# Patient Record
Sex: Female | Born: 1980
Health system: Southern US, Community
[De-identification: ages and names within clinical notes are randomized; demographics above are authoritative.]

## PROBLEM LIST (undated history)

## (undated) ENCOUNTER — Emergency Department (HOSPITAL_COMMUNITY): Admission: EM | Source: Home / Self Care

## (undated) DIAGNOSIS — D509 Iron deficiency anemia, unspecified: Secondary | ICD-10-CM

## (undated) DIAGNOSIS — D649 Anemia, unspecified: Secondary | ICD-10-CM

## (undated) DIAGNOSIS — K5090 Crohn's disease, unspecified, without complications: Secondary | ICD-10-CM

## (undated) DIAGNOSIS — I10 Essential (primary) hypertension: Secondary | ICD-10-CM

## (undated) DIAGNOSIS — E669 Obesity, unspecified: Secondary | ICD-10-CM

## (undated) DIAGNOSIS — R1012 Left upper quadrant pain: Secondary | ICD-10-CM

## (undated) HISTORY — PX: TOENAIL EXCISION: SHX183

## (undated) HISTORY — DX: Anemia, unspecified: D64.9

## (undated) HISTORY — DX: Left upper quadrant pain: R10.12

---
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
## 2001-08-16 ENCOUNTER — Emergency Department (HOSPITAL_COMMUNITY): Admission: EM | Admit: 2001-08-16 | Discharge: 2001-08-16 | Payer: Self-pay | Admitting: Emergency Medicine

## 2002-11-07 ENCOUNTER — Emergency Department (HOSPITAL_COMMUNITY): Admission: EM | Admit: 2002-11-07 | Discharge: 2002-11-07 | Payer: Self-pay | Admitting: Emergency Medicine

## 2002-11-07 ENCOUNTER — Encounter: Payer: Self-pay | Admitting: Emergency Medicine

## 2002-12-29 ENCOUNTER — Emergency Department (HOSPITAL_COMMUNITY): Admission: EM | Admit: 2002-12-29 | Discharge: 2002-12-29 | Payer: Self-pay | Admitting: Emergency Medicine

## 2003-03-17 ENCOUNTER — Emergency Department (HOSPITAL_COMMUNITY): Admission: EM | Admit: 2003-03-17 | Discharge: 2003-03-17 | Payer: Self-pay | Admitting: Emergency Medicine

## 2003-05-05 ENCOUNTER — Inpatient Hospital Stay (HOSPITAL_COMMUNITY): Admission: AD | Admit: 2003-05-05 | Discharge: 2003-05-05 | Payer: Self-pay | Admitting: Obstetrics & Gynecology

## 2003-05-11 ENCOUNTER — Emergency Department (HOSPITAL_COMMUNITY): Admission: AD | Admit: 2003-05-11 | Discharge: 2003-05-11 | Payer: Self-pay | Admitting: Family Medicine

## 2003-08-15 ENCOUNTER — Emergency Department (HOSPITAL_COMMUNITY): Admission: EM | Admit: 2003-08-15 | Discharge: 2003-08-15 | Payer: Self-pay | Admitting: Emergency Medicine

## 2003-12-05 ENCOUNTER — Emergency Department (HOSPITAL_COMMUNITY): Admission: EM | Admit: 2003-12-05 | Discharge: 2003-12-05 | Payer: Self-pay | Admitting: Family Medicine

## 2004-08-28 ENCOUNTER — Emergency Department (HOSPITAL_COMMUNITY): Admission: EM | Admit: 2004-08-28 | Discharge: 2004-08-28 | Payer: Self-pay | Admitting: *Deleted

## 2005-01-06 ENCOUNTER — Emergency Department (HOSPITAL_COMMUNITY): Admission: EM | Admit: 2005-01-06 | Discharge: 2005-01-06 | Payer: Self-pay | Admitting: Emergency Medicine

## 2005-01-26 ENCOUNTER — Emergency Department (HOSPITAL_COMMUNITY): Admission: EM | Admit: 2005-01-26 | Discharge: 2005-01-26 | Payer: Self-pay | Admitting: Emergency Medicine

## 2005-06-04 ENCOUNTER — Emergency Department (HOSPITAL_COMMUNITY): Admission: EM | Admit: 2005-06-04 | Discharge: 2005-06-04 | Payer: Self-pay | Admitting: Emergency Medicine

## 2005-08-16 ENCOUNTER — Emergency Department (HOSPITAL_COMMUNITY): Admission: EM | Admit: 2005-08-16 | Discharge: 2005-08-16 | Payer: Self-pay | Admitting: Emergency Medicine

## 2005-09-29 ENCOUNTER — Emergency Department (HOSPITAL_COMMUNITY): Admission: EM | Admit: 2005-09-29 | Discharge: 2005-09-29 | Payer: Self-pay | Admitting: Family Medicine

## 2005-11-24 ENCOUNTER — Ambulatory Visit (HOSPITAL_COMMUNITY): Admission: RE | Admit: 2005-11-24 | Discharge: 2005-11-24 | Payer: Self-pay | Admitting: Family Medicine

## 2005-11-24 ENCOUNTER — Emergency Department (HOSPITAL_COMMUNITY): Admission: EM | Admit: 2005-11-24 | Discharge: 2005-11-24 | Payer: Self-pay | Admitting: Family Medicine

## 2005-11-28 ENCOUNTER — Emergency Department (HOSPITAL_COMMUNITY): Admission: EM | Admit: 2005-11-28 | Discharge: 2005-11-28 | Payer: Self-pay | Admitting: Emergency Medicine

## 2005-12-19 ENCOUNTER — Emergency Department (HOSPITAL_COMMUNITY): Admission: EM | Admit: 2005-12-19 | Discharge: 2005-12-19 | Payer: Self-pay | Admitting: Family Medicine

## 2006-01-19 ENCOUNTER — Emergency Department (HOSPITAL_COMMUNITY): Admission: EM | Admit: 2006-01-19 | Discharge: 2006-01-19 | Payer: Self-pay | Admitting: Emergency Medicine

## 2006-01-27 ENCOUNTER — Emergency Department (HOSPITAL_COMMUNITY): Admission: EM | Admit: 2006-01-27 | Discharge: 2006-01-27 | Payer: Self-pay | Admitting: Emergency Medicine

## 2006-03-17 ENCOUNTER — Emergency Department (HOSPITAL_COMMUNITY): Admission: EM | Admit: 2006-03-17 | Discharge: 2006-03-17 | Payer: Self-pay | Admitting: Family Medicine

## 2006-03-20 ENCOUNTER — Emergency Department (HOSPITAL_COMMUNITY): Admission: EM | Admit: 2006-03-20 | Discharge: 2006-03-20 | Payer: Self-pay | Admitting: Family Medicine

## 2006-06-29 ENCOUNTER — Emergency Department (HOSPITAL_COMMUNITY): Admission: EM | Admit: 2006-06-29 | Discharge: 2006-06-29 | Payer: Self-pay | Admitting: Emergency Medicine

## 2006-09-13 ENCOUNTER — Emergency Department (HOSPITAL_COMMUNITY): Admission: EM | Admit: 2006-09-13 | Discharge: 2006-09-13 | Payer: Self-pay | Admitting: Family Medicine

## 2006-12-22 ENCOUNTER — Emergency Department (HOSPITAL_COMMUNITY): Admission: EM | Admit: 2006-12-22 | Discharge: 2006-12-22 | Payer: Self-pay | Admitting: Emergency Medicine

## 2008-04-18 ENCOUNTER — Emergency Department (HOSPITAL_COMMUNITY): Admission: EM | Admit: 2008-04-18 | Discharge: 2008-04-18 | Payer: Self-pay | Admitting: Emergency Medicine

## 2008-04-27 ENCOUNTER — Emergency Department (HOSPITAL_COMMUNITY): Admission: EM | Admit: 2008-04-27 | Discharge: 2008-04-27 | Payer: Self-pay | Admitting: Emergency Medicine

## 2008-06-06 ENCOUNTER — Emergency Department (HOSPITAL_COMMUNITY): Admission: EM | Admit: 2008-06-06 | Discharge: 2008-06-06 | Payer: Self-pay | Admitting: Emergency Medicine

## 2008-06-23 ENCOUNTER — Emergency Department (HOSPITAL_COMMUNITY): Admission: EM | Admit: 2008-06-23 | Discharge: 2008-06-23 | Payer: Self-pay | Admitting: Emergency Medicine

## 2008-08-27 ENCOUNTER — Emergency Department (HOSPITAL_COMMUNITY): Admission: EM | Admit: 2008-08-27 | Discharge: 2008-08-28 | Payer: Self-pay | Admitting: Emergency Medicine

## 2008-12-09 ENCOUNTER — Emergency Department (HOSPITAL_COMMUNITY): Admission: EM | Admit: 2008-12-09 | Discharge: 2008-12-09 | Payer: Self-pay | Admitting: Family Medicine

## 2008-12-11 ENCOUNTER — Emergency Department (HOSPITAL_COMMUNITY): Admission: EM | Admit: 2008-12-11 | Discharge: 2008-12-11 | Payer: Self-pay | Admitting: Emergency Medicine

## 2009-03-24 ENCOUNTER — Encounter (INDEPENDENT_AMBULATORY_CARE_PROVIDER_SITE_OTHER): Payer: Self-pay | Admitting: Family Medicine

## 2009-03-24 ENCOUNTER — Ambulatory Visit: Payer: Self-pay | Admitting: Internal Medicine

## 2009-03-24 LAB — CONVERTED CEMR LAB
ALT: 11 units/L (ref 0–35)
AST: 14 units/L (ref 0–37)
Albumin: 3.9 g/dL (ref 3.5–5.2)
Alkaline Phosphatase: 78 units/L (ref 39–117)
BUN: 11 mg/dL (ref 6–23)
Basophils Absolute: 0.1 10*3/uL (ref 0.0–0.1)
Basophils Relative: 1 % (ref 0–1)
CO2: 22 meq/L (ref 19–32)
Calcium: 9 mg/dL (ref 8.4–10.5)
Chloride: 108 meq/L (ref 96–112)
Cholesterol: 129 mg/dL (ref 0–200)
Creatinine, Ser: 0.6 mg/dL (ref 0.40–1.20)
Eosinophils Absolute: 0.2 10*3/uL (ref 0.0–0.7)
Eosinophils Relative: 3 % (ref 0–5)
Glucose, Bld: 87 mg/dL (ref 70–99)
HCT: 32.1 % — ABNORMAL LOW (ref 36.0–46.0)
HDL: 36 mg/dL — ABNORMAL LOW (ref >39)
Helicobacter Pylori Antibody-IgG: <0.4
Hemoglobin: 9.5 g/dL — ABNORMAL LOW (ref 12.0–15.0)
LDL Cholesterol: 80 mg/dL (ref 0–99)
Lymphocytes Relative: 47 % — ABNORMAL HIGH (ref 12–46)
Lymphs Abs: 3.7 10*3/uL (ref 0.7–4.0)
MCHC: 29.6 g/dL — ABNORMAL LOW (ref 30.0–36.0)
MCV: 62.3 fL — ABNORMAL LOW (ref 78.0–100.0)
Monocytes Absolute: 0.4 10*3/uL (ref 0.1–1.0)
Monocytes Relative: 5 % (ref 3–12)
Neutro Abs: 3.5 10*3/uL (ref 1.7–7.7)
Neutrophils Relative %: 45 % (ref 43–77)
Platelets: 399 10*3/uL (ref 150–400)
Potassium: 4 meq/L (ref 3.5–5.3)
RBC: 5.15 M/uL — ABNORMAL HIGH (ref 3.87–5.11)
RDW: 17.9 % — ABNORMAL HIGH (ref 11.5–15.5)
Sodium: 141 meq/L (ref 135–145)
TSH: 1.071 microintl units/mL (ref 0.350–4.500)
Testosterone: 41.57 ng/dL (ref 10–70)
Total Bilirubin: 0.4 mg/dL (ref 0.3–1.2)
Total CHOL/HDL Ratio: 3.6
Total Protein: 7.6 g/dL (ref 6.0–8.3)
Triglycerides: 66 mg/dL (ref <150)
VLDL: 13 mg/dL (ref 0–40)
WBC: 7.8 10*3/uL (ref 4.0–10.5)

## 2009-04-27 ENCOUNTER — Emergency Department (HOSPITAL_COMMUNITY): Admission: EM | Admit: 2009-04-27 | Discharge: 2009-04-27 | Payer: Self-pay | Admitting: Family Medicine

## 2009-06-29 ENCOUNTER — Emergency Department (HOSPITAL_COMMUNITY): Admission: EM | Admit: 2009-06-29 | Discharge: 2009-06-30 | Payer: Self-pay | Admitting: Emergency Medicine

## 2009-08-27 ENCOUNTER — Emergency Department (HOSPITAL_COMMUNITY): Admission: EM | Admit: 2009-08-27 | Discharge: 2009-08-27 | Payer: Self-pay | Admitting: Emergency Medicine

## 2009-08-29 ENCOUNTER — Emergency Department (HOSPITAL_COMMUNITY): Admission: EM | Admit: 2009-08-29 | Discharge: 2009-08-29 | Payer: Self-pay | Admitting: Family Medicine

## 2010-03-03 ENCOUNTER — Emergency Department (HOSPITAL_COMMUNITY)
Admission: EM | Admit: 2010-03-03 | Discharge: 2010-03-03 | Payer: Self-pay | Source: Home / Self Care | Admitting: Family Medicine

## 2010-03-06 LAB — POCT URINALYSIS DIPSTICK
Bilirubin Urine: NEGATIVE
Hgb urine dipstick: NEGATIVE
Ketones, ur: NEGATIVE mg/dL
Nitrite: NEGATIVE
Protein, ur: NEGATIVE mg/dL
Specific Gravity, Urine: 1.015 (ref 1.005–1.030)
Urine Glucose, Fasting: NEGATIVE mg/dL
Urobilinogen, UA: 1 mg/dL (ref 0.0–1.0)
pH: 7 (ref 5.0–8.0)

## 2010-03-06 LAB — POCT PREGNANCY, URINE: Preg Test, Ur: NEGATIVE

## 2010-05-20 LAB — DIFFERENTIAL
Basophils Absolute: 0.1 10*3/uL (ref 0.0–0.1)
Basophils Relative: 1 % (ref 0–1)
Eosinophils Absolute: 0.4 10*3/uL (ref 0.0–0.7)
Eosinophils Relative: 4 % (ref 0–5)
Lymphocytes Relative: 34 % (ref 12–46)
Lymphs Abs: 3.5 10*3/uL (ref 0.7–4.0)
Monocytes Absolute: 0.5 10*3/uL (ref 0.1–1.0)
Monocytes Relative: 5 % (ref 3–12)
Neutro Abs: 5.9 10*3/uL (ref 1.7–7.7)
Neutrophils Relative %: 56 % (ref 43–77)

## 2010-05-20 LAB — COMPREHENSIVE METABOLIC PANEL
ALT: 15 U/L (ref 0–35)
AST: 18 U/L (ref 0–37)
Albumin: 3.4 g/dL — ABNORMAL LOW (ref 3.5–5.2)
Alkaline Phosphatase: 81 U/L (ref 39–117)
BUN: 10 mg/dL (ref 6–23)
CO2: 24 mEq/L (ref 19–32)
Calcium: 9.1 mg/dL (ref 8.4–10.5)
Chloride: 106 mEq/L (ref 96–112)
Creatinine, Ser: 0.57 mg/dL (ref 0.4–1.2)
GFR calc Af Amer: >60 mL/min (ref >60)
GFR calc non Af Amer: >60 mL/min (ref >60)
Glucose, Bld: 91 mg/dL (ref 70–99)
Potassium: 3.8 mEq/L (ref 3.5–5.1)
Sodium: 138 mEq/L (ref 135–145)
Total Bilirubin: 0.5 mg/dL (ref 0.3–1.2)
Total Protein: 7.3 g/dL (ref 6.0–8.3)

## 2010-05-20 LAB — URINALYSIS, ROUTINE W REFLEX MICROSCOPIC
Bilirubin Urine: NEGATIVE
Glucose, UA: NEGATIVE mg/dL
Hgb urine dipstick: NEGATIVE
Ketones, ur: NEGATIVE mg/dL
Nitrite: NEGATIVE
Protein, ur: NEGATIVE mg/dL
Specific Gravity, Urine: 1.046 — ABNORMAL HIGH (ref 1.005–1.030)
Urobilinogen, UA: 1 mg/dL (ref 0.0–1.0)
pH: 6 (ref 5.0–8.0)

## 2010-05-20 LAB — CBC
HCT: 31.9 % — ABNORMAL LOW (ref 36.0–46.0)
Hemoglobin: 9.7 g/dL — ABNORMAL LOW (ref 12.0–15.0)
MCHC: 30.4 g/dL (ref 30.0–36.0)
MCV: 62.4 fL — ABNORMAL LOW (ref 78.0–100.0)
Platelets: 335 10*3/uL (ref 150–400)
RBC: 5.11 MIL/uL (ref 3.87–5.11)
RDW: 19.6 % — ABNORMAL HIGH (ref 11.5–15.5)
WBC: 10.4 10*3/uL (ref 4.0–10.5)

## 2010-05-20 LAB — PREGNANCY, URINE: Preg Test, Ur: NEGATIVE

## 2010-05-20 LAB — LIPASE, BLOOD: Lipase: 14 U/L (ref 11–59)

## 2010-05-24 LAB — DIFFERENTIAL
Basophils Absolute: 0.1 10*3/uL (ref 0.0–0.1)
Basophils Relative: 1 % (ref 0–1)
Eosinophils Absolute: 0.1 10*3/uL (ref 0.0–0.7)
Eosinophils Relative: 1 % (ref 0–5)
Lymphocytes Relative: 34 % (ref 12–46)
Lymphs Abs: 4.6 10*3/uL — ABNORMAL HIGH (ref 0.7–4.0)
Monocytes Absolute: 0.7 10*3/uL (ref 0.1–1.0)
Monocytes Relative: 5 % (ref 3–12)
Neutro Abs: 8.2 10*3/uL — ABNORMAL HIGH (ref 1.7–7.7)
Neutrophils Relative %: 60 % (ref 43–77)

## 2010-05-24 LAB — CBC
HCT: 30 % — ABNORMAL LOW (ref 36.0–46.0)
Hemoglobin: 9.2 g/dL — ABNORMAL LOW (ref 12.0–15.0)
MCHC: 30.8 g/dL (ref 30.0–36.0)
MCV: 62.4 fL — ABNORMAL LOW (ref 78.0–100.0)
Platelets: 342 10*3/uL (ref 150–400)
RBC: 4.8 MIL/uL (ref 3.87–5.11)
RDW: 19.8 % — ABNORMAL HIGH (ref 11.5–15.5)
WBC: 13.7 10*3/uL — ABNORMAL HIGH (ref 4.0–10.5)

## 2010-12-29 ENCOUNTER — Encounter: Payer: Self-pay | Admitting: Emergency Medicine

## 2010-12-29 ENCOUNTER — Emergency Department (HOSPITAL_COMMUNITY)
Admission: EM | Admit: 2010-12-29 | Discharge: 2010-12-29 | Disposition: A | Discharge status: Home / Self Care | Payer: Self-pay | Attending: Emergency Medicine | Admitting: Emergency Medicine

## 2010-12-29 DIAGNOSIS — K029 Dental caries, unspecified: Secondary | ICD-10-CM | POA: Insufficient documentation

## 2010-12-29 DIAGNOSIS — H9209 Otalgia, unspecified ear: Secondary | ICD-10-CM | POA: Insufficient documentation

## 2010-12-29 DIAGNOSIS — S025XXA Fracture of tooth (traumatic), initial encounter for closed fracture: Secondary | ICD-10-CM | POA: Insufficient documentation

## 2010-12-29 DIAGNOSIS — X58XXXA Exposure to other specified factors, initial encounter: Secondary | ICD-10-CM | POA: Insufficient documentation

## 2010-12-29 DIAGNOSIS — Z87891 Personal history of nicotine dependence: Secondary | ICD-10-CM | POA: Insufficient documentation

## 2010-12-29 DIAGNOSIS — K089 Disorder of teeth and supporting structures, unspecified: Secondary | ICD-10-CM | POA: Insufficient documentation

## 2010-12-29 DIAGNOSIS — R070 Pain in throat: Secondary | ICD-10-CM | POA: Insufficient documentation

## 2010-12-29 LAB — RAPID STREP SCREEN (MED CTR MEBANE ONLY): Streptococcus, Group A Screen (Direct): NEGATIVE

## 2010-12-29 MED ORDER — PENICILLIN V POTASSIUM 500 MG PO TABS: 500.0000 mg | ORAL_TABLET | Freq: Four times a day (QID) | ORAL | Status: AC

## 2010-12-29 MED ORDER — HYDROCODONE-ACETAMINOPHEN 5-500 MG PO TABS: 1.0000 | ORAL_TABLET | Freq: Four times a day (QID) | ORAL | Status: AC | PRN

## 2010-12-29 MED ORDER — TRAMADOL HCL 50 MG PO TABS
50.0000 mg | ORAL_TABLET | Freq: Once | ORAL | Status: AC
  Administered 2010-12-29: 50 mg via ORAL
  Filled 2010-12-29: qty 1

## 2010-12-29 NOTE — ED Provider Notes (Signed)
History     CSN: [id number] Arrival date & time: 12/29/2010  4:38 AM   First MD Initiated Contact with Patient 12/29/10 0500      Chief Complaint  Patient presents with  . Otalgia    (Consider location/radiation/quality/duration/timing/severity/associated sxs/prior treatment) Patient is a 30 y.o. female presenting with ear pain and dental injury. The history is provided by the patient. No language interpreter was used.  Otalgia This is a new problem. The current episode started more than 2 days ago. There is pain in the left ear. The problem occurs constantly. The problem has not changed since onset.There has been no fever. The pain is at a severity of 9/10. The pain is severe. Associated symptoms include sore throat. Pertinent negatives include no ear discharge, no headaches, no hearing loss, no rhinorrhea, no abdominal pain, no diarrhea, no vomiting and no neck pain. Associated symptoms comments: Toothache.  No trismus no change in voice no drooling no difficulty swallowing only pain with repeated swallowing. Her past medical history does not include chronic ear infection, hearing loss or tympanostomy tube.  Dental Injury This is a new problem. The current episode started more than 2 days ago. The problem occurs constantly. The problem has not changed since onset.Pertinent negatives include no chest pain, no abdominal pain, no headaches and no shortness of breath. The symptoms are aggravated by nothing. The symptoms are relieved by nothing. She has tried acetaminophen for the symptoms. The treatment provided no relief.  left lower 2nd molar  Past Medical History  Diagnosis Date  . Asthma     History reviewed. No pertinent past surgical history.  History reviewed. No pertinent family history.  History  Substance Use Topics  . Smoking status: Former Games developer  . Smokeless tobacco: Not on file  . Alcohol Use: No    OB History    Grav Para Term Preterm Abortions TAB SAB Ect Mult  Living                  Review of Systems  Constitutional: Negative for activity change.  HENT: Positive for ear pain and sore throat. Negative for hearing loss, nosebleeds, congestion, facial swelling, rhinorrhea, neck pain, neck stiffness and ear discharge.   Eyes: Negative for discharge.  Respiratory: Negative for apnea and shortness of breath.   Cardiovascular: Negative for chest pain.  Gastrointestinal: Negative for vomiting, abdominal pain, diarrhea and abdominal distention.  Genitourinary: Negative for difficulty urinating.  Musculoskeletal: Negative for arthralgias.  Neurological: Negative for facial asymmetry and headaches.  Hematological: Negative.   Psychiatric/Behavioral: Negative.     Allergies  Shellfish allergy and Sulfa antibiotics  Home Medications   Current Outpatient Rx  Name Route Sig Dispense Refill  . ALBUTEROL SULFATE HFA 108 (90 BASE) MCG/ACT IN AERS Inhalation Inhale 2 puffs into the lungs every 6 (six) hours as needed. wheezing       BP 144/92  Pulse 74  Temp(Src) 97.8 F (36.6 C) (Oral)  Resp 20  SpO2 95%  Physical Exam  Constitutional: She is oriented to person, place, and time. She appears well-developed and well-nourished.  HENT:  Head: Normocephalic and atraumatic.  Right Ear: No drainage or tenderness. Tympanic membrane is not injected, not scarred and not erythematous. No middle ear effusion.  Left Ear: No drainage or tenderness. Tympanic membrane is not injected, not scarred and not erythematous.  No middle ear effusion.  Mouth/Throat: Oropharynx is clear and moist.    Eyes: Conjunctivae and EOM are normal. Pupils are equal,  round, and reactive to light. Right eye exhibits no discharge. Left eye exhibits no discharge.  Neck: Normal range of motion. Neck supple. No JVD present.  Cardiovascular: Normal rate and regular rhythm.   Pulmonary/Chest: Effort normal and breath sounds normal. No stridor.  Abdominal: Soft. Bowel sounds are  normal. There is no tenderness. There is no rebound and no guarding.  Musculoskeletal: She exhibits no edema.  Lymphadenopathy:    She has no cervical adenopathy.  Neurological: She is alert and oriented to person, place, and time.  Skin: Skin is warm and dry.  Psychiatric: She has a normal mood and affect.    ED Course  Procedures (including critical care time)   Labs Reviewed  RAPID STREP SCREEN   No results found.   No diagnosis found.    MDM  Patient counseled to use bnarrier protection while on and for several weeks following taking penicillin antibiotics to prevent the risk of pregnancy.  Patient verbalizes understanding.          Jasmine Awe, MD 12/29/10 [phone]

## 2010-12-29 NOTE — ED Notes (Signed)
Patient with left ear pain, which radiates to left neck/throat.  Patient having difficulty swallowing.

## 2010-12-29 NOTE — ED Notes (Signed)
Patient with broken tooth in lower left jaw.  Patient has swelling and pain from ear down neck and having difficulty swallowing.  Patient remains CAOx3 at this time.

## 2011-02-18 ENCOUNTER — Emergency Department (HOSPITAL_COMMUNITY)
Admission: EM | Admit: 2011-02-18 | Discharge: 2011-02-18 | Disposition: A | Discharge status: Home / Self Care | Payer: Self-pay | Attending: Emergency Medicine | Admitting: Emergency Medicine

## 2011-02-18 ENCOUNTER — Encounter (HOSPITAL_COMMUNITY): Payer: Self-pay | Admitting: Emergency Medicine

## 2011-02-18 DIAGNOSIS — B349 Viral infection, unspecified: Secondary | ICD-10-CM

## 2011-02-18 DIAGNOSIS — B9789 Other viral agents as the cause of diseases classified elsewhere: Secondary | ICD-10-CM | POA: Insufficient documentation

## 2011-02-18 DIAGNOSIS — J45909 Unspecified asthma, uncomplicated: Secondary | ICD-10-CM | POA: Insufficient documentation

## 2011-02-18 DIAGNOSIS — R51 Headache: Secondary | ICD-10-CM | POA: Insufficient documentation

## 2011-02-18 DIAGNOSIS — IMO0001 Reserved for inherently not codable concepts without codable children: Secondary | ICD-10-CM | POA: Insufficient documentation

## 2011-02-18 DIAGNOSIS — R059 Cough, unspecified: Secondary | ICD-10-CM | POA: Insufficient documentation

## 2011-02-18 DIAGNOSIS — R05 Cough: Secondary | ICD-10-CM | POA: Insufficient documentation

## 2011-02-18 MED ORDER — ALBUTEROL SULFATE HFA 108 (90 BASE) MCG/ACT IN AERS: 2.0000 | INHALATION_SPRAY | RESPIRATORY_TRACT | Status: DC | PRN

## 2011-02-18 MED ORDER — ALBUTEROL SULFATE HFA 108 (90 BASE) MCG/ACT IN AERS
2.0000 | INHALATION_SPRAY | RESPIRATORY_TRACT | Status: DC | PRN
  Administered 2011-02-18: 2 via RESPIRATORY_TRACT
  Filled 2011-02-18: qty 6.7

## 2011-02-18 MED ORDER — ACETAMINOPHEN 325 MG PO TABS
650.0000 mg | ORAL_TABLET | Freq: Once | ORAL | Status: AC
  Administered 2011-02-18: 650 mg via ORAL
  Filled 2011-02-18: qty 2

## 2011-02-18 NOTE — ED Provider Notes (Signed)
Medical screening examination/treatment/procedure(s) were performed by non-physician practitioner and as supervising physician I was immediately available for consultation/collaboration.  Caylon Saine, MD 02/18/11 1614 

## 2011-02-18 NOTE — ED Notes (Signed)
Pt reports cough, wheezing , body aches  And headache. Intermittent fever. Sx unresponsive to OTC meds

## 2011-02-18 NOTE — ED Provider Notes (Signed)
History     CSN: [id number]  Arrival date & time 02/18/11  1015   None     Chief Complaint  Patient presents with  . Cough    Reports cough and wheezing x 3 days    (Consider location/radiation/quality/duration/timing/severity/associated sxs/prior treatment) HPI  Patient with history of asthma who uses as needed albuterol inhalers at home for flares of asthma presents to emergency department complaining of a 4 to 5-day history of gradual increasing cough, wheezing, body aches, intermittent headaches, and a fever of 102 take at home 3 days ago. Patient states she's been taking over-the-counter Tylenol and ibuprofen for aches, pains and fevers but states she has run out of her albuterol inhaler. Patient states that since treating her fever 3 days ago she has not had return of fever. Patient states that other than history of asthma she has no other known medical problems. Patient has not established care with primary care provider. She has aggravating factor stating that inhaler use we'll decrease wheezing and coughing. Symptoms are gradual onset, persistent, and unchanging.  Past Medical History  Diagnosis Date  . Asthma     History reviewed. No pertinent past surgical history.  Family History  Problem Relation Age of Onset  . Diabetes Mother   . Hypertension Mother     History  Substance Use Topics  . Smoking status: Former Games developer  . Smokeless tobacco: Not on file  . Alcohol Use: No    OB History    Grav Para Term Preterm Abortions TAB SAB Ect Mult Living                  Review of Systems  All other systems reviewed and are negative.    Allergies  Shellfish allergy and Sulfa antibiotics  Home Medications   Current Outpatient Rx  Name Route Sig Dispense Refill  . THERAFLU FLU/CHEST CONGESTION PO Oral Take 30 mLs by mouth every 4 (four) hours.      . GUAIFENESIN ER 600 MG PO TB12 Oral Take 1,200 mg by mouth 2 (two) times daily.      . GUAIFENESIN 100 MG/5ML PO  SYRP Oral Take 200 mg by mouth every 4 (four) hours as needed. For cough     . ALBUTEROL SULFATE HFA 108 (90 BASE) MCG/ACT IN AERS Inhalation Inhale 2 puffs into the lungs every 6 (six) hours as needed. wheezing       BP 156/72  Pulse 79  Temp(Src) 98.2 F (36.8 C) (Oral)  Resp 18  SpO2 99%  LMP 02/05/2011  Physical Exam  Vitals reviewed. Constitutional: She is oriented to person, place, and time. She appears well-developed and well-nourished. No distress.  HENT:  Head: Normocephalic and atraumatic.  Right Ear: External ear normal.  Left Ear: External ear normal.  Nose: Nose normal.  Mouth/Throat: No oropharyngeal exudate.       Mild erythema of posterior pharynx and tonsils no tonsillar exudate or enlargement. Patent airway. Swallowing secretions well  Eyes: Conjunctivae and EOM are normal. Pupils are equal, round, and reactive to light.  Neck: Normal range of motion. Neck supple.  Cardiovascular: Normal rate, regular rhythm and normal heart sounds.  Exam reveals no gallop and no friction rub.   No murmur heard. Pulmonary/Chest: Effort normal. No respiratory distress. She has wheezes. She has no rales. She exhibits no tenderness.       Diffuse expiratory wheezing. No accessory muscle use. No respiratory distress.  Abdominal: Soft. She exhibits no distension and no  mass. There is no tenderness. There is no rebound and no guarding.  Lymphadenopathy:    She has no cervical adenopathy.  Neurological: She is alert and oriented to person, place, and time. She has normal reflexes.  Skin: Skin is warm and dry. No rash noted. She is not diaphoretic.  Psychiatric: She has a normal mood and affect.    ED Course  Procedures (including critical care time)  Albuterol inhaler given to bedside. By mouth Tylenol.  Labs Reviewed - No data to display No results found.   1. Asthma   2. Viral syndrome       MDM  Patient has pulse ox of 99% on room air and resolution of wheezing after  2 puffs of inhaler given at bedside. Afebrile and NAD. Low risk PE and PERC negative denying CP.         Jenness [place], [name] 02/18/11 1515

## 2011-05-24 ENCOUNTER — Emergency Department (HOSPITAL_BASED_OUTPATIENT_CLINIC_OR_DEPARTMENT_OTHER)
Admission: EM | Admit: 2011-05-24 | Discharge: 2011-05-24 | Disposition: A | Discharge status: Home / Self Care | Payer: Managed Care, Other (non HMO) | Attending: Emergency Medicine | Admitting: Emergency Medicine

## 2011-05-24 ENCOUNTER — Emergency Department (INDEPENDENT_AMBULATORY_CARE_PROVIDER_SITE_OTHER): Payer: Managed Care, Other (non HMO)

## 2011-05-24 ENCOUNTER — Encounter (HOSPITAL_BASED_OUTPATIENT_CLINIC_OR_DEPARTMENT_OTHER): Payer: Self-pay | Admitting: *Deleted

## 2011-05-24 DIAGNOSIS — F411 Generalized anxiety disorder: Secondary | ICD-10-CM

## 2011-05-24 DIAGNOSIS — R079 Chest pain, unspecified: Secondary | ICD-10-CM

## 2011-05-24 DIAGNOSIS — Z79899 Other long term (current) drug therapy: Secondary | ICD-10-CM | POA: Insufficient documentation

## 2011-05-24 DIAGNOSIS — M940 Chondrocostal junction syndrome [Tietze]: Secondary | ICD-10-CM | POA: Insufficient documentation

## 2011-05-24 DIAGNOSIS — R0602 Shortness of breath: Secondary | ICD-10-CM | POA: Insufficient documentation

## 2011-05-24 DIAGNOSIS — J45909 Unspecified asthma, uncomplicated: Secondary | ICD-10-CM | POA: Insufficient documentation

## 2011-05-24 LAB — CBC
Hemoglobin: 9.9 g/dL — ABNORMAL LOW (ref 12.0–15.0)
MCH: 18.9 pg — ABNORMAL LOW (ref 26.0–34.0)
RBC: 5.25 MIL/uL — ABNORMAL HIGH (ref 3.87–5.11)
WBC: 9.1 10*3/uL (ref 4.0–10.5)

## 2011-05-24 LAB — BASIC METABOLIC PANEL
CO2: 27 mEq/L (ref 19–32)
Chloride: 102 mEq/L (ref 96–112)
Glucose, Bld: 91 mg/dL (ref 70–99)
Potassium: 3.9 mEq/L (ref 3.5–5.1)
Sodium: 137 mEq/L (ref 135–145)

## 2011-05-24 LAB — TROPONIN I: Troponin I: <0.3 ng/mL (ref <0.30)

## 2011-05-24 MED ORDER — OXYCODONE-ACETAMINOPHEN 5-325 MG PO TABS
1.0000 | ORAL_TABLET | Freq: Once | ORAL | Status: AC
  Administered 2011-05-24: 1 via ORAL
  Filled 2011-05-24: qty 1

## 2011-05-24 MED ORDER — IBUPROFEN 400 MG PO TABS
600.0000 mg | ORAL_TABLET | Freq: Once | ORAL | Status: AC
  Administered 2011-05-24: 600 mg via ORAL
  Filled 2011-05-24: qty 1

## 2011-05-24 MED ORDER — IBUPROFEN 600 MG PO TABS: 600.0000 mg | ORAL_TABLET | Freq: Four times a day (QID) | ORAL | Status: AC | PRN

## 2011-05-24 NOTE — Discharge Instructions (Signed)
Costochondritis Costochondritis is a condition in which the tissue (cartilage) that connects your ribs with your breastbone (sternum) becomes irritated and causes chest pain.  HOME CARE  Avoid activities that wear you out.   Do not strain your ribs. Avoid activities that use your:   Chest.   Belly.   Side muscles.   Put ice on the area.   Put ice in a plastic bag.   Place a towel betwen your skin and the bag.   Leave the ice on for 15 to 20 minutes, 3 to 4 times a day.   Only take medicine as told by your doctor.  GET HELP RIGHT AWAY IF:   Your pain gets worse.   You are very uncomfortable.   You have a fever.   You have trouble breathing.   You cough up blood.   You start sweating or throwing up (vomiting).   You develop new, unexplained symptoms.  MAKE SURE YOU:   Understand these instructions.   Will watch your condition.   Will get help right away if you are not doing well or get worse.  Document Released: 07/17/2007 Document Revised: 01/17/2011 Document Reviewed: 07/17/2007 [facility] Patient Information 2012 [place], [place].  RESOURCE GUIDE  Dental Problems  Patients with Medicaid: [facility] [phone] [address]                                           [phone] Phone:  [phone]                                                   Phone:  [phone]  If unable to pay or uninsured, contact:  [facility] or [facility]. to become qualified for the adult dental clinic.  Chronic Pain Problems Contact [facility]  [phone] Patients need to be referred by their primary care doctor.  Insufficient Money for Medicine Contact [employer]:  call "211" or [facility] [phone].  No Primary Care Doctor Call [facility]  [phone] Other agencies that provide inexpensive medical care    [facility]  [phone]    [facility] Internal  Medicine  [phone]    [facility]  [phone]    [facility]  [phone]    [facility]  [phone]    [facility]  [phone]  Psychological Services [facility]  [phone] [facility]  [phone] [facility] Mental Health   [phone] (emergency services [phone])  Abuse/Neglect [facility] Child Abuse Hotline 831-705-8021 [facility] Child Abuse Hotline [phone] (After Hours)  Emergency Shelter [facility] [phone]  M[facility] [place] [facility] [phone] [place] [phone]  MRSA Hotline #:   (402) 619-1370    [facility] Resources  [facility] [place]  [employer]                           [facility] [address] [place] Phone:  [phone]                                  Phone:  [phone]                   Phone:  [place] Phone:  [place] [phone] (After Hours)

## 2011-05-24 NOTE — ED Provider Notes (Signed)
History     CSN: [id number]  Arrival date & time 05/24/11  1458   First MD Initiated Contact with Patient 05/24/11 1513      Chief Complaint  Patient presents with  . Anxiety    (Consider location/radiation/quality/duration/timing/severity/associated sxs/prior treatment) HPI  C/o chest pain. C/o sharp episodes approx 1 week ago lasting "all day". Woke up this am with sharp pain central chest, states "it was kinda there last night". Denies radiation. Worse with lifting her right arm. Constant since 0600. +nausea and NBNB emesis this morning but states "it was after I was crying really bad and upset". Dec PO intake. Denies shortness of breath but states she was hyperventilating when pain was severe. Currently pain 8/10. Did not take anything for pain pta. Denies wheezing/fever/chills/cough. No trauma. Denies h/o VTE in self or family. No recent hosp/surg/immob. No h/o cancer. Denies exogenous hormone use, no leg pain or swelling. Does not feel like her asthma.    ED Notes, ED Provider Notes from 05/24/11 0000 to 05/24/11 14:57:27       Julieanne Manson, RN 05/24/2011 14:55      All week she has had intermittent episodes of chest pain. Woke this am with chest pain, sob. Went to work and had episode of same. EMS was called. Monitor NSR. She was hyperventilating on arrival of EMS. States she feels weak and tightness in her chest continues.     Past Medical History  Diagnosis Date  . Asthma     History reviewed. No pertinent past surgical history.  Family History  Problem Relation Age of Onset  . Diabetes Mother   . Hypertension Mother     History  Substance Use Topics  . Smoking status: Former Games developer  . Smokeless tobacco: Not on file  . Alcohol Use: No    OB History    Grav Para Term Preterm Abortions TAB SAB Ect Mult Living                  Review of Systems  All other systems reviewed and are negative.  except as noted HPI   Allergies  Shellfish allergy and  Sulfa antibiotics  Home Medications   Current Outpatient Rx  Name Route Sig Dispense Refill  . THERAFLU FLU/CHEST CONGESTION PO Oral Take 30 mLs by mouth every 4 (four) hours.      . ALBUTEROL SULFATE HFA 108 (90 BASE) MCG/ACT IN AERS Inhalation Inhale 2 puffs into the lungs every 6 (six) hours as needed. wheezing     . ALBUTEROL SULFATE HFA 108 (90 BASE) MCG/ACT IN AERS Inhalation Inhale 2 puffs into the lungs every 4 (four) hours as needed for wheezing. 1 Inhaler 0  . GUAIFENESIN ER 600 MG PO TB12 Oral Take 1,200 mg by mouth 2 (two) times daily.      . GUAIFENESIN 100 MG/5ML PO SYRP Oral Take 200 mg by mouth every 4 (four) hours as needed. For cough     . IBUPROFEN 600 MG PO TABS Oral Take 1 tablet (600 mg total) by mouth every 6 (six) hours as needed for pain. 30 tablet 0    BP 135/56  Pulse 63  Temp(Src) 98.3 F (36.8 C) (Oral)  Resp 20  SpO2 99%  LMP 05/16/2011  Physical Exam  Nursing note and vitals reviewed. Constitutional: She is oriented to person, place, and time. She appears well-developed.  HENT:  Head: Atraumatic.  Mouth/Throat: Oropharynx is clear and moist.  Eyes: Conjunctivae and EOM are normal.  Pupils are equal, round, and reactive to light.  Neck: Normal range of motion. Neck supple.  Cardiovascular: Normal rate, regular rhythm, normal heart sounds and intact distal pulses.   Pulmonary/Chest: Effort normal and breath sounds normal. No respiratory distress. She has no wheezes. She has no rales. She exhibits tenderness.       +CW ttp, states it does reproduce pain  Abdominal: Soft. She exhibits no distension. There is no tenderness. There is no rebound and no guarding.  Musculoskeletal: Normal range of motion. She exhibits no edema and no tenderness.  Neurological: She is alert and oriented to person, place, and time.  Skin: Skin is warm and dry. No rash noted.  Psychiatric: She has a normal mood and affect.    Date: 05/24/2011  Rate: 65  Rhythm: normal sinus  rhythm  QRS Axis: normal  Intervals: normal  ST/T Wave abnormalities: normal  Conduction Disutrbances:none  Narrative Interpretation:   Old EKG Reviewed: changes noted   ED Course  Procedures (including critical care time)  Labs Reviewed  CBC - Abnormal; Notable for the following:    RBC 5.25 (*)    Hemoglobin 9.9 (*)    HCT 32.3 (*)    MCV 61.5 (*)    MCH 18.9 (*)    RDW 20.0 (*)    All other components within normal limits  BASIC METABOLIC PANEL  TROPONIN I   Dg Chest 2 View  05/24/2011  *RADIOLOGY REPORT*  Clinical Data: Shortness of breath, chest pain.  CHEST - 2 VIEW  Comparison: 08/29/2009  Findings: Heart and mediastinal contours are within normal limits. No focal opacities or effusions.  No acute bony abnormality.  IMPRESSION: No active cardiopulmonary disease.  Original Report Authenticated By: Cyndie Chime, M.D.   1. Costochondritis    MDM  Pw chest pain. Reproducible on exam, worse with movement and raising RUE. Likely costochondritis. Low risk for VTE and PERC negative. CXR, EKG unremarkable. Low suspicion myocarditis and trop negative. Also low susp pericarditis, endocarditis. Given ibuprofen and percocet in the Emergency Department. Will discharge home with ibuprofen. PMD referral. Precaution for return.         Forbes Cellar, MD 05/24/11 1654

## 2011-05-24 NOTE — ED Notes (Signed)
All week she has had intermittent episodes of chest pain. Woke this am with chest pain, sob. Went to work and had episode of same. EMS was called. Monitor NSR. She was hyperventilating on arrival of EMS. States she feels weak and tightness in her chest continues.

## 2011-08-23 ENCOUNTER — Emergency Department (HOSPITAL_BASED_OUTPATIENT_CLINIC_OR_DEPARTMENT_OTHER): Payer: Managed Care, Other (non HMO)

## 2011-08-23 ENCOUNTER — Emergency Department (HOSPITAL_BASED_OUTPATIENT_CLINIC_OR_DEPARTMENT_OTHER)
Admission: EM | Admit: 2011-08-23 | Discharge: 2011-08-23 | Disposition: A | Discharge status: Home / Self Care | Payer: Managed Care, Other (non HMO) | Attending: Emergency Medicine | Admitting: Emergency Medicine

## 2011-08-23 ENCOUNTER — Encounter (HOSPITAL_BASED_OUTPATIENT_CLINIC_OR_DEPARTMENT_OTHER): Payer: Self-pay | Admitting: Emergency Medicine

## 2011-08-23 DIAGNOSIS — J45909 Unspecified asthma, uncomplicated: Secondary | ICD-10-CM | POA: Insufficient documentation

## 2011-08-23 DIAGNOSIS — R197 Diarrhea, unspecified: Secondary | ICD-10-CM | POA: Insufficient documentation

## 2011-08-23 DIAGNOSIS — R1011 Right upper quadrant pain: Secondary | ICD-10-CM | POA: Insufficient documentation

## 2011-08-23 DIAGNOSIS — R112 Nausea with vomiting, unspecified: Secondary | ICD-10-CM | POA: Insufficient documentation

## 2011-08-23 DIAGNOSIS — R109 Unspecified abdominal pain: Secondary | ICD-10-CM

## 2011-08-23 LAB — COMPREHENSIVE METABOLIC PANEL
AST: 13 U/L (ref 0–37)
Albumin: 3.6 g/dL (ref 3.5–5.2)
BUN: 11 mg/dL (ref 6–23)
Chloride: 103 mEq/L (ref 96–112)
Creatinine, Ser: 0.7 mg/dL (ref 0.50–1.10)
Total Bilirubin: 0.2 mg/dL — ABNORMAL LOW (ref 0.3–1.2)
Total Protein: 8 g/dL (ref 6.0–8.3)

## 2011-08-23 LAB — URINALYSIS, ROUTINE W REFLEX MICROSCOPIC
Glucose, UA: NEGATIVE mg/dL
Leukocytes, UA: NEGATIVE
Nitrite: NEGATIVE
Specific Gravity, Urine: 1.026 (ref 1.005–1.030)
pH: 6 (ref 5.0–8.0)

## 2011-08-23 LAB — CBC WITH DIFFERENTIAL/PLATELET
Basophils Absolute: 0 10*3/uL (ref 0.0–0.1)
Eosinophils Absolute: 0.3 10*3/uL (ref 0.0–0.7)
Lymphocytes Relative: 39 % (ref 12–46)
MCH: 18.8 pg — ABNORMAL LOW (ref 26.0–34.0)
MCHC: 31.2 g/dL (ref 30.0–36.0)
Monocytes Absolute: 0.8 10*3/uL (ref 0.1–1.0)
Neutro Abs: 5 10*3/uL (ref 1.7–7.7)
Neutrophils Relative %: 50 % (ref 43–77)
RDW: 20.6 % — ABNORMAL HIGH (ref 11.5–15.5)

## 2011-08-23 LAB — URINE MICROSCOPIC-ADD ON

## 2011-08-23 LAB — PREGNANCY, URINE: Preg Test, Ur: NEGATIVE

## 2011-08-23 MED ORDER — KETOROLAC TROMETHAMINE 30 MG/ML IJ SOLN
30.0000 mg | Freq: Once | INTRAMUSCULAR | Status: AC
  Administered 2011-08-23: 30 mg via INTRAMUSCULAR

## 2011-08-23 MED ORDER — IOHEXOL 300 MG/ML  SOLN
40.0000 mL | Freq: Once | INTRAMUSCULAR | Status: AC | PRN
  Administered 2011-08-23: 40 mL via ORAL

## 2011-08-23 MED ORDER — IBUPROFEN 600 MG PO TABS: 600.0000 mg | ORAL_TABLET | Freq: Four times a day (QID) | ORAL | Status: AC | PRN

## 2011-08-23 MED ORDER — IOHEXOL 300 MG/ML  SOLN
100.0000 mL | Freq: Once | INTRAMUSCULAR | Status: AC | PRN
  Administered 2011-08-23: 100 mL via INTRAVENOUS

## 2011-08-23 MED ORDER — MORPHINE SULFATE 4 MG/ML IJ SOLN
4.0000 mg | Freq: Once | INTRAMUSCULAR | Status: AC
  Administered 2011-08-23: 4 mg via INTRAVENOUS
  Filled 2011-08-23: qty 1

## 2011-08-23 MED ORDER — KETOROLAC TROMETHAMINE 30 MG/ML IJ SOLN
30.0000 mg | Freq: Once | INTRAMUSCULAR | Status: DC
  Filled 2011-08-23: qty 1

## 2011-08-23 MED ORDER — ONDANSETRON HCL 4 MG/2ML IJ SOLN
4.0000 mg | Freq: Once | INTRAMUSCULAR | Status: AC
  Administered 2011-08-23: 4 mg via INTRAVENOUS
  Filled 2011-08-23: qty 2

## 2011-08-23 NOTE — ED Provider Notes (Signed)
History     CSN: [id number]  Arrival date & time 08/23/11  7829   First MD Initiated Contact with Patient 08/23/11 1943      Chief Complaint  Patient presents with  . Abdominal Pain    (Consider location/radiation/quality/duration/timing/severity/associated sxs/prior treatment) Patient is a 31 y.o. female presenting with abdominal pain.  Abdominal Pain The primary symptoms of the illness include abdominal pain. The primary symptoms of the illness do not include fever, shortness of breath, nausea, vomiting, diarrhea, dysuria or vaginal discharge. The current episode started 2 days ago. The onset of the illness was gradual. The problem has been gradually worsening.  The abdominal pain is located in the RUQ.  Associated symptoms comments: She reports milder but similar symptoms last week associated with nausea, vomiting and diarrhea. No fever. Symptoms resolved after 2-3 days. RLQ pain returned this week without N, V, D. Still no fever. She denies vaginal discharge or history of ovarian cysts. No dysuria. She states that the pain in the abdomen is worse with movement of her right leg. .    Past Medical History  Diagnosis Date  . Asthma     Past Surgical History  Procedure Date  . Toenail excision     Family History  Problem Relation Age of Onset  . Diabetes Mother   . Hypertension Mother     History  Substance Use Topics  . Smoking status: Former Games developer  . Smokeless tobacco: Not on file  . Alcohol Use: No    OB History    Grav Para Term Preterm Abortions TAB SAB Ect Mult Living                  Review of Systems  Constitutional: Negative for fever.  Respiratory: Negative for shortness of breath.   Cardiovascular: Negative for chest pain.  Gastrointestinal: Positive for abdominal pain. Negative for nausea, vomiting and diarrhea.  Genitourinary: Negative for dysuria and vaginal discharge.    Allergies  Shellfish allergy and Sulfa antibiotics  Home Medications     Current Outpatient Rx  Name Route Sig Dispense Refill  . ALBUTEROL SULFATE HFA 108 (90 BASE) MCG/ACT IN AERS Inhalation Inhale 2 puffs into the lungs every 6 (six) hours as needed. wheezing       BP 141/71  Pulse 76  Temp 98.2 F (36.8 C) (Oral)  Resp 16  Ht 5\' 7"  (1.702 m)  Wt 276 lb (125.193 kg)  BMI 43.23 kg/m2  SpO2 100%  LMP 03/26/2011  Physical Exam  Constitutional: She is oriented to person, place, and time. She appears well-developed and well-nourished. No distress.  Abdominal: Soft.       Obese abdomen with RLQ and right inguinal pain. No palpable mass. No rebound or guarding. BS + x 4 quadrants. Soft.   Neurological: She is alert and oriented to person, place, and time.    ED Course  Procedures (including critical care time)  Labs Reviewed  URINALYSIS, ROUTINE W REFLEX MICROSCOPIC - Abnormal; Notable for the following:    Hgb urine dipstick SMALL (*)     All other components within normal limits  CBC WITH DIFFERENTIAL - Abnormal; Notable for the following:    RBC 5.37 (*)     Hemoglobin 10.1 (*)     HCT 32.4 (*)     MCV 60.3 (*)     MCH 18.8 (*)     RDW 20.6 (*)     All other components within normal limits  COMPREHENSIVE METABOLIC PANEL -  Abnormal; Notable for the following:    Total Bilirubin 0.2 (*)     All other components within normal limits  PREGNANCY, URINE  URINE MICROSCOPIC-ADD ON   No results found.   No diagnosis found.    MDM  RLQ abdominal pain and loss of appetite. Pain radiates into groin. appy vs. Abdominal wall pain vs. Ovarian cyst vs. Abscess. Will obtain CT scan.         Rodena Medin, PA-C 08/23/11 2204

## 2011-08-23 NOTE — ED Notes (Addendum)
RLQ abd pain with N/V last week that resolved.  Returned past 3 days.  No N/V this time but pain is radiating down to groin and right leg.

## 2011-08-23 NOTE — ED Notes (Signed)
MD at bedside. 

## 2011-08-23 NOTE — ED Notes (Signed)
Pt returned from CT °

## 2011-08-24 NOTE — ED Provider Notes (Signed)
Medical screening examination/treatment/procedure(s) were conducted as a shared visit with non-physician practitioner(s) and myself.  I personally evaluated the patient during the encounter   RLQ abdominal pain. Sig reproducibility with palpation R inguinal region. No hernia palpated. No CMT. Hymen intact. CT A/P without acute appy. Doubt GYN etiology. Pain control, No EMC precluding discharge at this time. Given Precautions for return. PMD f/u.   Forbes Cellar, MD 08/24/11 [phone]

## 2011-10-21 ENCOUNTER — Encounter (HOSPITAL_COMMUNITY): Payer: Self-pay | Admitting: *Deleted

## 2011-10-21 ENCOUNTER — Emergency Department (INDEPENDENT_AMBULATORY_CARE_PROVIDER_SITE_OTHER)
Admission: EM | Admit: 2011-10-21 | Discharge: 2011-10-21 | Disposition: A | Discharge status: Home / Self Care | Payer: 59 | Source: Home / Self Care | Attending: Family Medicine | Admitting: Family Medicine

## 2011-10-21 DIAGNOSIS — J45901 Unspecified asthma with (acute) exacerbation: Secondary | ICD-10-CM

## 2011-10-21 MED ORDER — METHYLPREDNISOLONE SODIUM SUCC 125 MG IJ SOLR
INTRAMUSCULAR | Status: AC
  Filled 2011-10-21: qty 2

## 2011-10-21 MED ORDER — IPRATROPIUM BROMIDE 0.02 % IN SOLN
0.5000 mg | Freq: Once | RESPIRATORY_TRACT | Status: AC
  Administered 2011-10-21: 0.5 mg via RESPIRATORY_TRACT

## 2011-10-21 MED ORDER — ALBUTEROL SULFATE (5 MG/ML) 0.5% IN NEBU
5.0000 mg | INHALATION_SOLUTION | Freq: Once | RESPIRATORY_TRACT | Status: AC
  Administered 2011-10-21: 5 mg via RESPIRATORY_TRACT

## 2011-10-21 MED ORDER — ALBUTEROL SULFATE (5 MG/ML) 0.5% IN NEBU
INHALATION_SOLUTION | RESPIRATORY_TRACT | Status: AC
  Filled 2011-10-21: qty 1

## 2011-10-21 MED ORDER — METHYLPREDNISOLONE 4 MG PO KIT: PACK | ORAL | Status: AC

## 2011-10-21 MED ORDER — METHYLPREDNISOLONE SODIUM SUCC 125 MG IJ SOLR
125.0000 mg | Freq: Once | INTRAMUSCULAR | Status: AC
  Administered 2011-10-21: 125 mg via INTRAMUSCULAR

## 2011-10-21 NOTE — ED Notes (Signed)
Started w/ runny nose and "minor cough" on Sat; over past day, describes "asthma flare-up" unrelieved by albuterol HFA.  Ronchi and faint exp wheezes noted throughout BBS.

## 2011-10-21 NOTE — ED Provider Notes (Signed)
History     CSN: [id number]  Arrival date & time 10/21/11  5621   First MD Initiated Contact with Patient 10/21/11 2009      Chief Complaint  Patient presents with  . Shortness of Breath  . Wheezing    (Consider location/radiation/quality/duration/timing/severity/associated sxs/prior treatment) Patient is a 31 y.o. female presenting with shortness of breath. The history is provided by the patient.  Shortness of Breath  The current episode started 2 days ago. The onset was gradual. The problem has been gradually worsening. The problem is moderate. Associated symptoms include cough, shortness of breath and wheezing. Pertinent negatives include no chest pain, no fever and no sore throat. Exposure to Toxic Fumes: pt is a smoker, none in past 2 days. Her past medical history is significant for asthma.    Past Medical History  Diagnosis Date  . Asthma     Past Surgical History  Procedure Date  . Toenail excision     Family History  Problem Relation Age of Onset  . Diabetes Mother   . Hypertension Mother     History  Substance Use Topics  . Smoking status: Current Everyday Smoker  . Smokeless tobacco: Not on file  . Alcohol Use: No    OB History    Grav Para Term Preterm Abortions TAB SAB Ect Mult Living                  Review of Systems  Constitutional: Negative for fever.  HENT: Negative.  Negative for sore throat.   Respiratory: Positive for cough, shortness of breath and wheezing.   Cardiovascular: Negative for chest pain.    Allergies  Shellfish allergy and Sulfa antibiotics  Home Medications   Current Outpatient Rx  Name Route Sig Dispense Refill  . ALBUTEROL SULFATE HFA 108 (90 BASE) MCG/ACT IN AERS Inhalation Inhale 2 puffs into the lungs every 6 (six) hours as needed. wheezing     . METHYLPREDNISOLONE 4 MG PO KIT  Start on tues, follow package directions, take until finished 21 tablet 0    BP 116/54  Pulse 88  Temp 99.2 F (37.3 C) (Oral)   Resp 24  SpO2 100%  Physical Exam  Nursing note and vitals reviewed. Constitutional: She is oriented to person, place, and time. She appears well-developed and well-nourished. She appears distressed.  HENT:  Right Ear: External ear normal.  Left Ear: External ear normal.  Mouth/Throat: Oropharynx is clear and moist.  Eyes: Conjunctivae are normal. Pupils are equal, round, and reactive to light.  Neck: Normal range of motion. Neck supple.  Cardiovascular: Normal rate, normal heart sounds and intact distal pulses.   Pulmonary/Chest: She has wheezes.  Abdominal: Soft. Bowel sounds are normal.  Lymphadenopathy:    She has no cervical adenopathy.  Neurological: She is alert and oriented to person, place, and time.  Skin: Skin is warm and dry.    ED Course  Procedures (including critical care time)  Labs Reviewed - No data to display No results found.   1. Asthma exacerbation       MDM  preneb peak flow 180., post was 260, sx improved., lungs clear at d/c.        Linna Hoff, MD 10/21/11 2122

## 2011-12-03 ENCOUNTER — Emergency Department (HOSPITAL_COMMUNITY)
Admission: EM | Admit: 2011-12-03 | Discharge: 2011-12-03 | Disposition: A | Discharge status: Home / Self Care | Payer: 59 | Attending: Emergency Medicine | Admitting: Emergency Medicine

## 2011-12-03 ENCOUNTER — Encounter (HOSPITAL_COMMUNITY): Payer: Self-pay | Admitting: Emergency Medicine

## 2011-12-03 DIAGNOSIS — K0889 Other specified disorders of teeth and supporting structures: Secondary | ICD-10-CM

## 2011-12-03 DIAGNOSIS — J45909 Unspecified asthma, uncomplicated: Secondary | ICD-10-CM | POA: Insufficient documentation

## 2011-12-03 DIAGNOSIS — Z87891 Personal history of nicotine dependence: Secondary | ICD-10-CM | POA: Insufficient documentation

## 2011-12-03 DIAGNOSIS — K089 Disorder of teeth and supporting structures, unspecified: Secondary | ICD-10-CM | POA: Insufficient documentation

## 2011-12-03 MED ORDER — AMOXICILLIN 500 MG PO CAPS: 500.0000 mg | ORAL_CAPSULE | Freq: Three times a day (TID) | ORAL | Status: DC

## 2011-12-03 MED ORDER — OXYCODONE-ACETAMINOPHEN 5-325 MG PO TABS: 1.0000 | ORAL_TABLET | Freq: Four times a day (QID) | ORAL | Status: DC | PRN

## 2011-12-03 NOTE — ED Provider Notes (Signed)
History     CSN: [id number]  Arrival date & time 12/03/11  2006   First MD Initiated Contact with Patient 12/03/11 2245      Chief Complaint  Patient presents with  . Jaw Pain    (Consider location/radiation/quality/duration/timing/severity/associated sxs/prior treatment) HPI  Patient presents to the emergency department with a dental complaint. Symptoms began 2 days ago after her tooth broke. The patient has tried to alleviate pain with ibuprofen.  Pain rated at a 10/10, characterized as throbbing in nature and located right lower molar. Patient denies fever, night sweats, chills, difficulty swallowing or opening mouth, SOB, nuchal rigidity or decreased ROM of neck.  Patient does not have a dentist and requests a resource guide at discharge.  She has an appointment with the dentist scheduled for 1 week from now but is trying to find something for sooner.   Past Medical History  Diagnosis Date  . Asthma     Past Surgical History  Procedure Date  . Toenail excision     Family History  Problem Relation Age of Onset  . Diabetes Mother   . Hypertension Mother     History  Substance Use Topics  . Smoking status: Former Smoker    Types: Cigarettes  . Smokeless tobacco: Not on file  . Alcohol Use: No    OB History    Grav Para Term Preterm Abortions TAB SAB Ect Mult Living                  Review of Systems   Review of Systems  Gen: no weight loss, fevers, chills, night sweats  Eyes: no discharge or drainage, no occular pain or visual changes  Nose: no epistaxis or rhinorrhea  Mouth: + dental pain, no sore throat  Neck: no neck pain  Lungs:No wheezing, coughing or hemoptysis CV: no chest pain, palpitations, dependent edema or orthopnea  Abd: no abdominal pain, nausea, vomiting  GU: no dysuria or gross hematuria  MSK:  No abnormalities  Neuro: no headache, no focal neurologic deficits  Skin: no abnormalities Psyche: negative.    Allergies  Shellfish  allergy and Sulfa antibiotics  Home Medications   Current Outpatient Rx  Name Route Sig Dispense Refill  . ALBUTEROL SULFATE HFA 108 (90 BASE) MCG/ACT IN AERS Inhalation Inhale 2 puffs into the lungs every 6 (six) hours as needed. wheezing     . IBUPROFEN 200 MG PO TABS Oral Take 400 mg by mouth every 6 (six) hours as needed. Pain jaw    . AMOXICILLIN 500 MG PO CAPS Oral Take 1 capsule (500 mg total) by mouth 3 (three) times daily. 21 capsule 0  . OXYCODONE-ACETAMINOPHEN 5-325 MG PO TABS Oral Take 1 tablet by mouth every 6 (six) hours as needed for pain. 15 tablet 0    BP 152/97  Pulse 63  Temp 97.4 F (36.3 C) (Oral)  Resp 18  SpO2 98%  LMP 09/26/2011  Physical Exam  Constitutional: She appears well-developed and well-nourished. No distress.  HENT:  Head: Normocephalic and atraumatic.  Mouth/Throat: Uvula is midline, oropharynx is clear and moist and mucous membranes are normal. Normal dentition. Dental caries (Pts tooth shows no obvious abscess but moderate to severe tenderness to palpation of marked tooth) present. No uvula swelling.    Eyes: Pupils are equal, round, and reactive to light.  Neck: Trachea normal, normal range of motion and full passive range of motion without pain. Neck supple.  Cardiovascular: Normal rate, regular rhythm, normal heart  sounds and normal pulses.   Pulmonary/Chest: Effort normal and breath sounds normal. No respiratory distress. Chest wall is not dull to percussion. She exhibits no tenderness, no crepitus, no edema, no deformity and no retraction.  Abdominal: Normal appearance.  Musculoskeletal: Normal range of motion.  Neurological: She is alert. She has normal strength.  Skin: Skin is warm, dry and intact. She is not diaphoretic.  Psychiatric: She has a normal mood and affect. Her speech is normal. Cognition and memory are normal.    ED Course  Procedures (including critical care time)  Labs Reviewed - No data to display No results  found.   1. Toothache       MDM  Pt given Rx for Percocets 5-325 (10 tabs) and Penicillin. Patient informed that they need to find a dentist and have the tooth pulled or the symptoms may be reoccurring. A Resource guide has been given with dental providers. Patient has been given return to ED precautions.         Dorthula Matas, PA 12/03/11 2257

## 2011-12-03 NOTE — ED Notes (Signed)
Pt states she is having pain in her jaw on the right side that started 2 days ago   Pt states she started having swelling on the right side yesterday  Pt states today it hurts to swallow and the pain radiates up into her head

## 2011-12-03 NOTE — ED Notes (Addendum)
Patient has c/o of tooth pain . Jaw swelling since 10/20 (sunday). patient has appt with dentist next Friday. Assessed tooth . Note no tooth in socket but observe root in socket .  Request pain management . 10/10 . Hurt to swollen, unable to eat due to the pain. Took 15 ibuphren last at 3pm , and goody powder total of 3 last 530pm .

## 2011-12-04 NOTE — ED Provider Notes (Signed)
Medical screening examination/treatment/procedure(s) were performed by non-physician practitioner and as supervising physician I was immediately available for consultation/collaboration.  Jones Skene, M.D.     Jones Skene, MD 12/04/11 0200

## 2012-02-20 ENCOUNTER — Emergency Department (HOSPITAL_BASED_OUTPATIENT_CLINIC_OR_DEPARTMENT_OTHER): Payer: Managed Care, Other (non HMO)

## 2012-02-20 ENCOUNTER — Emergency Department (HOSPITAL_BASED_OUTPATIENT_CLINIC_OR_DEPARTMENT_OTHER)
Admission: EM | Admit: 2012-02-20 | Discharge: 2012-02-20 | Disposition: A | Discharge status: Home / Self Care | Payer: Managed Care, Other (non HMO) | Attending: Emergency Medicine | Admitting: Emergency Medicine

## 2012-02-20 ENCOUNTER — Encounter (HOSPITAL_BASED_OUTPATIENT_CLINIC_OR_DEPARTMENT_OTHER): Payer: Self-pay | Admitting: *Deleted

## 2012-02-20 DIAGNOSIS — J45909 Unspecified asthma, uncomplicated: Secondary | ICD-10-CM | POA: Insufficient documentation

## 2012-02-20 DIAGNOSIS — J069 Acute upper respiratory infection, unspecified: Secondary | ICD-10-CM | POA: Insufficient documentation

## 2012-02-20 DIAGNOSIS — Z87891 Personal history of nicotine dependence: Secondary | ICD-10-CM | POA: Insufficient documentation

## 2012-02-20 DIAGNOSIS — J3489 Other specified disorders of nose and nasal sinuses: Secondary | ICD-10-CM | POA: Insufficient documentation

## 2012-02-20 DIAGNOSIS — Z79899 Other long term (current) drug therapy: Secondary | ICD-10-CM | POA: Insufficient documentation

## 2012-02-20 MED ORDER — ALBUTEROL SULFATE (5 MG/ML) 0.5% IN NEBU
5.0000 mg | INHALATION_SOLUTION | Freq: Once | RESPIRATORY_TRACT | Status: AC
  Administered 2012-02-20: 5 mg via RESPIRATORY_TRACT
  Filled 2012-02-20: qty 1

## 2012-02-20 MED ORDER — PREDNISONE 20 MG PO TABS: 60.0000 mg | ORAL_TABLET | Freq: Every day | ORAL | Status: DC

## 2012-02-20 MED ORDER — NAPROXEN 500 MG PO TABS: 500.0000 mg | ORAL_TABLET | Freq: Two times a day (BID) | ORAL | Status: DC

## 2012-02-20 MED ORDER — BENZONATATE 100 MG PO CAPS: 100.0000 mg | ORAL_CAPSULE | Freq: Three times a day (TID) | ORAL | Status: DC

## 2012-02-20 MED ORDER — IPRATROPIUM BROMIDE 0.02 % IN SOLN
0.5000 mg | Freq: Once | RESPIRATORY_TRACT | Status: AC
  Administered 2012-02-20: 0.5 mg via RESPIRATORY_TRACT
  Filled 2012-02-20: qty 2.5

## 2012-02-20 NOTE — ED Notes (Signed)
Pt c/o uri symptoms x 3 days 

## 2012-02-20 NOTE — ED Provider Notes (Signed)
History    CSN: [id number] Arrival date & time 02/20/12  1651 First MD Initiated Contact with Patient 02/20/12 1701      Chief Complaint  Patient presents with  . URI    HPI Patient presents emergency room with complaints of cough and congestion. The symptoms started about 3 days ago. She has had nasal congestion associated with a recurrent persistent dry cough. The coughing has continued to increase in frequency. Now she is having pain in the right side of her chest with coughing. She denies any fevers. She feels like she can't catch her breath when she is coughing so much. She denies any leg swelling.   Patient does have history of asthma. She has been taking her medications and has also been using over-the-counter cold medications. She has not had much relief with that Past Medical History  Diagnosis Date  . Asthma     Past Surgical History  Procedure Date  . Toenail excision     Family History  Problem Relation Age of Onset  . Diabetes Mother   . Hypertension Mother     History  Substance Use Topics  . Smoking status: Former Smoker    Types: Cigarettes  . Smokeless tobacco: Not on file  . Alcohol Use: No    OB History    Grav Para Term Preterm Abortions TAB SAB Ect Mult Living                  Review of Systems  All other systems reviewed and are negative.    Allergies  Shellfish allergy and Sulfa antibiotics  Home Medications   Current Outpatient Rx  Name  Route  Sig  Dispense  Refill  . ALBUTEROL SULFATE HFA 108 (90 BASE) MCG/ACT IN AERS   Inhalation   Inhale 2 puffs into the lungs every 6 (six) hours as needed. wheezing          . AMOXICILLIN 500 MG PO CAPS   Oral   Take 1 capsule (500 mg total) by mouth 3 (three) times daily.   21 capsule   0   . IBUPROFEN 200 MG PO TABS   Oral   Take 400 mg by mouth every 6 (six) hours as needed. Pain jaw         . OXYCODONE-ACETAMINOPHEN 5-325 MG PO TABS   Oral   Take 1 tablet by mouth every 6 (six)  hours as needed for pain.   15 tablet   0     BP 158/86  Pulse 100  Temp 98.7 F (37.1 C) (Oral)  Resp 16  Ht 5\' 7"  (1.702 m)  Wt 220 lb (99.791 kg)  BMI 34.46 kg/m2  SpO2 98%  LMP 02/20/2012  Physical Exam  Nursing note and vitals reviewed. Constitutional: She appears well-developed and well-nourished. No distress.  HENT:  Head: Normocephalic and atraumatic.  Right Ear: External ear normal.  Left Ear: External ear normal.  Eyes: Conjunctivae normal are normal. Right eye exhibits no discharge. Left eye exhibits no discharge. No scleral icterus.  Neck: Neck supple. No tracheal deviation present.  Cardiovascular: Normal rate, regular rhythm and intact distal pulses.   Pulmonary/Chest: Effort normal and breath sounds normal. No stridor. No respiratory distress. She has no wheezes. She has no rales.       Frequent coughing  Abdominal: Soft. Bowel sounds are normal. She exhibits no distension. There is no tenderness. There is no rebound and no guarding.  Musculoskeletal: She exhibits no edema and  no tenderness.  Neurological: She is alert. She has normal strength. No sensory deficit. Cranial nerve deficit:  no gross defecits noted. She exhibits normal muscle tone. She displays no seizure activity. Coordination normal.  Skin: Skin is warm and dry. No rash noted.  Psychiatric: She has a normal mood and affect.    ED Course  Procedures (including critical care time)  Labs Reviewed - No data to display Dg Chest 2 View  02/20/2012  *RADIOLOGY REPORT*  Clinical Data: Upper respiratory infection symptoms.  CHEST - 2 VIEW  Comparison: 05/24/2011.  Findings: Trachea is midline.  Heart size normal.  Lungs are somewhat low in volume, which likely causes vascular crowding.  No airspace consolidation or pleural fluid.  IMPRESSION: Low lung volumes without acute finding.   Original Report Authenticated By: Leanna Battles, M.D.      1. URI, acute       MDM  Patient feel slightly better  after treatment. She does not have any significant wheezing on exam but she could have a component of bronchospasm. We'll discharge her home with an inhaler and cough medication. I will add a short course of steroids as well considering her asthma history        Celene Kras, MD 02/20/12 [phone]

## 2013-01-08 ENCOUNTER — Emergency Department (INDEPENDENT_AMBULATORY_CARE_PROVIDER_SITE_OTHER)
Admission: EM | Admit: 2013-01-08 | Discharge: 2013-01-08 | Disposition: A | Discharge status: Home / Self Care | Payer: Managed Care, Other (non HMO) | Source: Home / Self Care | Attending: Emergency Medicine | Admitting: Emergency Medicine

## 2013-01-08 ENCOUNTER — Encounter (HOSPITAL_COMMUNITY): Payer: Self-pay | Admitting: Emergency Medicine

## 2013-01-08 ENCOUNTER — Emergency Department (INDEPENDENT_AMBULATORY_CARE_PROVIDER_SITE_OTHER): Payer: Managed Care, Other (non HMO)

## 2013-01-08 DIAGNOSIS — J069 Acute upper respiratory infection, unspecified: Secondary | ICD-10-CM

## 2013-01-08 DIAGNOSIS — J45901 Unspecified asthma with (acute) exacerbation: Secondary | ICD-10-CM

## 2013-01-08 MED ORDER — ALBUTEROL SULFATE (5 MG/ML) 0.5% IN NEBU
5.0000 mg | INHALATION_SOLUTION | Freq: Once | RESPIRATORY_TRACT | Status: AC
  Administered 2013-01-08: 5 mg via RESPIRATORY_TRACT

## 2013-01-08 MED ORDER — IPRATROPIUM BROMIDE 0.02 % IN SOLN: 0.5000 mg | RESPIRATORY_TRACT | Status: DC

## 2013-01-08 MED ORDER — ALBUTEROL SULFATE (5 MG/ML) 0.5% IN NEBU
INHALATION_SOLUTION | RESPIRATORY_TRACT | Status: AC
  Filled 2013-01-08: qty 1

## 2013-01-08 MED ORDER — IPRATROPIUM BROMIDE 0.02 % IN SOLN
RESPIRATORY_TRACT | Status: AC
  Filled 2013-01-08: qty 2.5

## 2013-01-08 MED ORDER — ALBUTEROL SULFATE (5 MG/ML) 0.5% IN NEBU: 5.0000 mg | INHALATION_SOLUTION | RESPIRATORY_TRACT | Status: DC

## 2013-01-08 MED ORDER — IPRATROPIUM BROMIDE 0.02 % IN SOLN
0.5000 mg | Freq: Once | RESPIRATORY_TRACT | Status: AC
  Administered 2013-01-08: 0.5 mg via RESPIRATORY_TRACT

## 2013-01-08 MED ORDER — PREDNISONE 50 MG PO TABS: ORAL_TABLET | ORAL | Status: DC

## 2013-01-08 MED ORDER — ALBUTEROL SULFATE HFA 108 (90 BASE) MCG/ACT IN AERS: 2.0000 | INHALATION_SPRAY | RESPIRATORY_TRACT | Status: DC | PRN

## 2013-01-08 NOTE — ED Provider Notes (Signed)
CSN: [id number]     Arrival date & time 01/08/13  4098 History   None    Chief Complaint  Patient presents with  . Asthma   (Consider location/radiation/quality/duration/timing/severity/associated sxs/prior Treatment) HPI Comments: 32 year old female with a history of asthma presents complaining of worsening of her asthma with wheezing at night and a sensation of tightness in her chest. She is here visiting from [place] for Thanksgiving and she forgot her albuterol inhaler at home. She was using it approximately 2 times daily at home the last couple of weeks since the weather has gotten cold. She also admits to some rhinorrhea and nasal congestion. She denies fever, chills, chest pain, shortness of breath. She does not feel like she is wheezing right now, she just feels tightness. She does not feel sick right now. She does smoke cigarettes   Past Medical History  Diagnosis Date  . Asthma    Past Surgical History  Procedure Laterality Date  . Toenail excision     Family History  Problem Relation Age of Onset  . Diabetes Mother   . Hypertension Mother    History  Substance Use Topics  . Smoking status: Former Smoker    Types: Cigarettes  . Smokeless tobacco: Not on file  . Alcohol Use: No   OB History   Grav Para Term Preterm Abortions TAB SAB Ect Mult Living                 Review of Systems  Constitutional: Negative for fever and chills.  HENT: Negative for sore throat.   Eyes: Negative for visual disturbance.  Respiratory: Positive for chest tightness and wheezing. Negative for cough and shortness of breath.   Cardiovascular: Negative for chest pain, palpitations and leg swelling.  Gastrointestinal: Negative for nausea, vomiting and abdominal pain.  Endocrine: Negative for polydipsia and polyuria.  Genitourinary: Negative for dysuria, urgency and frequency.  Musculoskeletal: Negative for arthralgias and myalgias.  Skin: Negative for rash.  Neurological: Negative for  dizziness, weakness and light-headedness.    Allergies  Shellfish allergy and Sulfa antibiotics  Home Medications   Current Outpatient Rx  Name  Route  Sig  Dispense  Refill  . albuterol (PROVENTIL HFA;VENTOLIN HFA) 108 (90 BASE) MCG/ACT inhaler   Inhalation   Inhale 2 puffs into the lungs every 6 (six) hours as needed. wheezing          . albuterol (PROVENTIL HFA;VENTOLIN HFA) 108 (90 BASE) MCG/ACT inhaler   Inhalation   Inhale 2 puffs into the lungs every 4 (four) hours as needed for wheezing or shortness of breath.   1 Inhaler   1   . amoxicillin (AMOXIL) 500 MG capsule   Oral   Take 1 capsule (500 mg total) by mouth 3 (three) times daily.   21 capsule   0   . benzonatate (TESSALON) 100 MG capsule   Oral   Take 1 capsule (100 mg total) by mouth every 8 (eight) hours.   21 capsule   0   . ibuprofen (ADVIL,MOTRIN) 200 MG tablet   Oral   Take 400 mg by mouth every 6 (six) hours as needed. Pain jaw         . naproxen (NAPROSYN) 500 MG tablet   Oral   Take 1 tablet (500 mg total) by mouth 2 (two) times daily.   30 tablet   0   . oxyCODONE-acetaminophen (PERCOCET/ROXICET) 5-325 MG per tablet   Oral   Take 1 tablet by mouth every  6 (six) hours as needed for pain.   15 tablet   0   . predniSONE (DELTASONE) 20 MG tablet   Oral   Take 3 tablets (60 mg total) by mouth daily.   15 tablet   0   . predniSONE (DELTASONE) 50 MG tablet      1 tablet PO QD   5 tablet   0    BP 117/81  Pulse 72  Temp(Src) 98.4 F (36.9 C) (Oral)  Resp 16  SpO2 96%  LMP 10/26/2012 Physical Exam  Nursing note and vitals reviewed. Constitutional: She is oriented to person, place, and time. Vital signs are normal. She appears well-developed and well-nourished. No distress.  Obese  HENT:  Head: Normocephalic and atraumatic.  Nose: Rhinorrhea present.  Mouth/Throat: Posterior oropharyngeal erythema present. No oropharyngeal exudate.  Cardiovascular: Normal rate, regular  rhythm and normal heart sounds.   Pulmonary/Chest: Effort normal and breath sounds normal. No respiratory distress. She has no wheezes. She has no rales.  Neurological: She is alert and oriented to person, place, and time. She has normal strength. Coordination normal.  Skin: Skin is warm and dry. No rash noted. She is not diaphoretic.  Psychiatric: She has a normal mood and affect. Judgment normal.    ED Course  Procedures (including critical care time) Labs Review Labs Reviewed - No data to display Imaging Review Dg Chest 2 View  01/08/2013   CLINICAL DATA:  Short of breath, chest pain and tightness  EXAM: CHEST  2 VIEW  COMPARISON:  Prior chest x-ray 02/20/2012  FINDINGS: The lungs are clear and negative for focal airspace consolidation, pulmonary edema or suspicious pulmonary nodule. No pleural effusion or pneumothorax. Cardiac and mediastinal contours are within normal limits. No acute fracture or lytic or blastic osseous lesions. The visualized upper abdominal bowel gas pattern is unremarkable.  IMPRESSION: No active cardiopulmonary disease.   Electronically Signed   By: Malachy Moan M.D.   On: 01/08/2013 11:34    Giving DuoNeb and reassess, chest x-ray if no improvement  MDM   1. Asthma exacerbation   2. URI (upper respiratory infection)    Chest x-ray is normal. Treating with prednisone, albuterol. Followup in a few days if not improving.  Meds ordered this encounter  Medications  . DISCONTD: ipratropium (ATROVENT) nebulizer solution 0.5 mg    Sig:   . DISCONTD: albuterol (PROVENTIL) (5 MG/ML) 0.5% nebulizer solution 5 mg    Sig:   . ipratropium (ATROVENT) nebulizer solution 0.5 mg    Sig:    And  . albuterol (PROVENTIL) (5 MG/ML) 0.5% nebulizer solution 5 mg    Sig:   . predniSONE (DELTASONE) 50 MG tablet    Sig: 1 tablet PO QD    Dispense:  5 tablet    Refill:  0    Order Specific Question:  Supervising Provider    Answer:  Lorenz Coaster, DAVID C [id number]  . albuterol  (PROVENTIL HFA;VENTOLIN HFA) 108 (90 BASE) MCG/ACT inhaler    Sig: Inhale 2 puffs into the lungs every 4 (four) hours as needed for wheezing or shortness of breath.    Dispense:  1 Inhaler    Refill:  1    Order Specific Question:  Supervising Provider    Answer:  Lorenz Coaster, DAVID C [[id number]]       Graylon Good, PA-C 01/08/13 1143

## 2013-01-08 NOTE — ED Notes (Signed)
Patient reports she has had asthma symptoms since Wednesday.  Patient has had difficulty sleeping, feeling tight at night.  Patient has sniffles today. Patient says she feels good, but can tell her asthma is starting to wind up.  Patient visiting from [place] and is out of albuterol

## 2013-01-08 NOTE — ED Provider Notes (Signed)
Medical screening examination/treatment/procedure(s) were performed by non-physician practitioner and as supervising physician I was immediately available for consultation/collaboration.  Deandre Stansel, M.D.  Cian Costanzo C Mardel Grudzien, MD 01/08/13 1334 

## 2013-01-08 NOTE — ED Notes (Signed)
Provided blankets 

## 2013-01-08 NOTE — ED Notes (Signed)
Breathing treatment completed.

## 2013-05-05 ENCOUNTER — Emergency Department (HOSPITAL_COMMUNITY): Payer: Managed Care, Other (non HMO)

## 2013-05-05 ENCOUNTER — Emergency Department (HOSPITAL_COMMUNITY)
Admission: EM | Admit: 2013-05-05 | Discharge: 2013-05-06 | Disposition: A | Discharge status: Home / Self Care | Payer: Managed Care, Other (non HMO) | Attending: Emergency Medicine | Admitting: Emergency Medicine

## 2013-05-05 ENCOUNTER — Encounter (HOSPITAL_COMMUNITY): Payer: Self-pay | Admitting: Emergency Medicine

## 2013-05-05 DIAGNOSIS — Z79899 Other long term (current) drug therapy: Secondary | ICD-10-CM | POA: Insufficient documentation

## 2013-05-05 DIAGNOSIS — R112 Nausea with vomiting, unspecified: Secondary | ICD-10-CM | POA: Insufficient documentation

## 2013-05-05 DIAGNOSIS — J45909 Unspecified asthma, uncomplicated: Secondary | ICD-10-CM | POA: Insufficient documentation

## 2013-05-05 DIAGNOSIS — R109 Unspecified abdominal pain: Secondary | ICD-10-CM | POA: Insufficient documentation

## 2013-05-05 DIAGNOSIS — Z87891 Personal history of nicotine dependence: Secondary | ICD-10-CM | POA: Insufficient documentation

## 2013-05-05 DIAGNOSIS — Z3202 Encounter for pregnancy test, result negative: Secondary | ICD-10-CM | POA: Insufficient documentation

## 2013-05-05 LAB — CBC WITH DIFFERENTIAL/PLATELET
BASOS ABS: 0.1 10*3/uL (ref 0.0–0.1)
Basophils Relative: 1 % (ref 0–1)
EOS ABS: 0.2 10*3/uL (ref 0.0–0.7)
Eosinophils Relative: 2 % (ref 0–5)
HEMATOCRIT: 33.6 % — AB (ref 36.0–46.0)
Hemoglobin: 10.4 g/dL — ABNORMAL LOW (ref 12.0–15.0)
LYMPHS PCT: 38 % (ref 12–46)
Lymphs Abs: 4 10*3/uL (ref 0.7–4.0)
MCH: 19.8 pg — AB (ref 26.0–34.0)
MCHC: 31 g/dL (ref 30.0–36.0)
MCV: 63.9 fL — ABNORMAL LOW (ref 78.0–100.0)
MONO ABS: 0.5 10*3/uL (ref 0.1–1.0)
Monocytes Relative: 5 % (ref 3–12)
NEUTROS ABS: 5.8 10*3/uL (ref 1.7–7.7)
NEUTROS PCT: 54 % (ref 43–77)
Platelets: 318 10*3/uL (ref 150–400)
RBC: 5.26 MIL/uL — ABNORMAL HIGH (ref 3.87–5.11)
RDW: 19.3 % — AB (ref 11.5–15.5)
WBC: 10.6 10*3/uL — ABNORMAL HIGH (ref 4.0–10.5)

## 2013-05-05 LAB — URINE MICROSCOPIC-ADD ON

## 2013-05-05 LAB — COMPREHENSIVE METABOLIC PANEL
ALBUMIN: 3.6 g/dL (ref 3.5–5.2)
ALT: 14 U/L (ref 0–35)
AST: 15 U/L (ref 0–37)
Alkaline Phosphatase: 101 U/L (ref 39–117)
BILIRUBIN TOTAL: 0.3 mg/dL (ref 0.3–1.2)
BUN: 13 mg/dL (ref 6–23)
CHLORIDE: 104 meq/L (ref 96–112)
CO2: 25 mEq/L (ref 19–32)
CREATININE: 0.74 mg/dL (ref 0.50–1.10)
Calcium: 9.7 mg/dL (ref 8.4–10.5)
GFR calc Af Amer: >90 mL/min (ref >90)
GFR calc non Af Amer: >90 mL/min (ref >90)
Glucose, Bld: 113 mg/dL — ABNORMAL HIGH (ref 70–99)
Potassium: 4 mEq/L (ref 3.7–5.3)
Sodium: 140 mEq/L (ref 137–147)
TOTAL PROTEIN: 7.9 g/dL (ref 6.0–8.3)

## 2013-05-05 LAB — WET PREP, GENITAL
TRICH WET PREP: NONE SEEN
YEAST WET PREP: NONE SEEN

## 2013-05-05 LAB — URINALYSIS, ROUTINE W REFLEX MICROSCOPIC
Bilirubin Urine: NEGATIVE
GLUCOSE, UA: NEGATIVE mg/dL
Ketones, ur: NEGATIVE mg/dL
LEUKOCYTES UA: NEGATIVE
Nitrite: NEGATIVE
PH: 6 (ref 5.0–8.0)
Protein, ur: NEGATIVE mg/dL
SPECIFIC GRAVITY, URINE: 1.029 (ref 1.005–1.030)
Urobilinogen, UA: 1 mg/dL (ref 0.0–1.0)

## 2013-05-05 LAB — LIPASE, BLOOD: LIPASE: 16 U/L (ref 11–59)

## 2013-05-05 LAB — PREGNANCY, URINE: PREG TEST UR: NEGATIVE

## 2013-05-05 MED ORDER — ONDANSETRON HCL 4 MG/2ML IJ SOLN
4.0000 mg | Freq: Once | INTRAMUSCULAR | Status: AC
  Administered 2013-05-05: 4 mg via INTRAVENOUS
  Filled 2013-05-05: qty 2

## 2013-05-05 MED ORDER — SODIUM CHLORIDE 0.9 % IV BOLUS (SEPSIS)
1000.0000 mL | Freq: Once | INTRAVENOUS | Status: AC
Start: 2013-05-05 — End: 2013-05-05
  Administered 2013-05-05: 1000 mL via INTRAVENOUS

## 2013-05-05 MED ORDER — IOHEXOL 300 MG/ML  SOLN
100.0000 mL | Freq: Once | INTRAMUSCULAR | Status: AC | PRN
  Administered 2013-05-05: 100 mL via INTRAVENOUS

## 2013-05-05 MED ORDER — MORPHINE SULFATE 4 MG/ML IJ SOLN
4.0000 mg | Freq: Once | INTRAMUSCULAR | Status: AC
  Administered 2013-05-05: 4 mg via INTRAVENOUS
  Filled 2013-05-05: qty 1

## 2013-05-05 MED ORDER — SODIUM CHLORIDE 0.9 % IV BOLUS (SEPSIS)
1000.0000 mL | Freq: Once | INTRAVENOUS | Status: AC
  Administered 2013-05-05: 1000 mL via INTRAVENOUS

## 2013-05-05 MED ORDER — IOHEXOL 300 MG/ML  SOLN
50.0000 mL | Freq: Once | INTRAMUSCULAR | Status: AC | PRN
  Administered 2013-05-05: 50 mL via ORAL

## 2013-05-05 NOTE — ED Notes (Signed)
Pt alert and oriented x4. Respirations even and unlabored, bilateral symmetrical rise and fall of chest. Skin warm and dry. In no acute distress. Denies needs.   

## 2013-05-05 NOTE — ED Notes (Signed)
Pt c/o ab pain, had n/v yesterday but none today. Also c/o left lower back pain. Denies urinary difficulty.

## 2013-05-05 NOTE — ED Provider Notes (Signed)
CSN: [id number]     Arrival date & time 05/05/13  1540 History   First MD Initiated Contact with Patient 05/05/13 1745     Chief Complaint  Patient presents with  . Abdominal Pain     (Consider location/radiation/quality/duration/timing/severity/associated sxs/prior Treatment) The history is provided by the patient. No language interpreter was used.  Victoria Booth is a 33 y/o F with PMHx of asthma presenting to the ED with abdominal pain that started on Monday afternoon. Patient reported that the abdominal pain is localized to the LUQ, LLQ, and suprapubic region with mild radiation to the left side of her back described as a dull aching sensation at rest and a sharp pain with motion and exhalation. Patient reported that she has been feeling nauseous with one episode of emesis yesterday - NB/NB. Reported that she has been taking Ibuprofen and Tylenol as needed for the pain with minimal relief. Patient reported that she recently just started her [employer], stated that she has been counting her carbs and started approximately 30 days ago. Reported that she had a normal BM today. Reported her LMP was 04/24/2013 - reported that she is a virgin. Denied fever, diarrhea, melena, hematochezia, dizziness, chest pain, shortness of breath, difficulty breathing, hematuria, dysuria.  PCP none   Past Medical History  Diagnosis Date  . Asthma    Past Surgical History  Procedure Laterality Date  . Toenail excision     Family History  Problem Relation Age of Onset  . Diabetes Mother   . Hypertension Mother    History  Substance Use Topics  . Smoking status: Former Smoker    Types: Cigarettes  . Smokeless tobacco: Not on file  . Alcohol Use: No   OB History   Grav Para Term Preterm Abortions TAB SAB Ect Mult Living                 Review of Systems  Constitutional: Negative for fever and chills.  Respiratory: Negative for chest tightness and shortness of breath.   Cardiovascular: Negative  for chest pain.  Gastrointestinal: Positive for nausea, vomiting and abdominal pain. Negative for diarrhea, constipation, blood in stool and anal bleeding.  Genitourinary: Positive for flank pain (Left-sided). Negative for hematuria, decreased urine volume, vaginal bleeding, vaginal discharge, vaginal pain and pelvic pain.  Musculoskeletal: Negative for back pain and neck pain.  Neurological: Negative for dizziness and weakness.  All other systems reviewed and are negative.      Allergies  Shellfish allergy and Sulfa antibiotics  Home Medications   Current Outpatient Rx  Name  Route  Sig  Dispense  Refill  . albuterol (PROVENTIL HFA;VENTOLIN HFA) 108 (90 BASE) MCG/ACT inhaler   Inhalation   Inhale 2 puffs into the lungs every 6 (six) hours as needed. wheezing          . ibuprofen (ADVIL,MOTRIN) 200 MG tablet   Oral   Take 400 mg by mouth every 6 (six) hours as needed. Pain jaw         . ondansetron (ZOFRAN) 4 MG tablet   Oral   Take 1 tablet (4 mg total) by mouth every 6 (six) hours.   12 tablet   0   . oxyCODONE-acetaminophen (PERCOCET/ROXICET) 5-325 MG per tablet   Oral   Take 1 tablet by mouth every 8 (eight) hours as needed for severe pain.   5 tablet   0    BP 130/80  Pulse 79  Temp(Src) 98.2 F (36.8 C) (  Oral)  Resp 15  SpO2 99%  LMP 04/24/2013 Physical Exam  Nursing note and vitals reviewed. Constitutional: She is oriented to person, place, and time. She appears well-developed and well-nourished. No distress.  HENT:  Head: Normocephalic and atraumatic.  Mouth/Throat: Oropharynx is clear and moist. No oropharyngeal exudate.  Eyes: Conjunctivae and EOM are normal. Pupils are equal, round, and reactive to light. Right eye exhibits no discharge. Left eye exhibits no discharge.  Neck: Normal range of motion. Neck supple. No tracheal deviation present.  Cardiovascular: Normal rate, regular rhythm and normal heart sounds.  Exam reveals no friction rub.   No  murmur heard. Pulses:      Radial pulses are 2+ on the right side, and 2+ on the left side.  Pulmonary/Chest: Effort normal and breath sounds normal. No respiratory distress. She has no wheezes. She has no rales.  Abdominal: Soft. Bowel sounds are normal. There is tenderness. There is no guarding.  Obese Discomfort upon palpation to left upper quadrant, left lower quadrant, right lower quadrant, suprapubic region Positive left-sided CVA tenderness Negative Murphy's sign Negative McBurney's point  Genitourinary: Vagina normal.  Pelvic Exam: Negative swelling, erythema, inflammation, lesions, sores noted to the external genitalia. Negative swelling, erythema, lesions, sores, masses noted to the vaginal canal. Negative blood in the vaginal vault. Negative signs of discharge. Cervix identify what negative swelling, erythema, inflammation, friability. Negative CMT. Mild discomfort upon palpation to the left lower quadrant, left adnexal tenderness noted. Negative right-sided adnexal tenderness. Exam chaperoned with tech  Musculoskeletal: Normal range of motion.  Full ROM to upper and lower extremities without difficulty noted, negative ataxia noted.  Lymphadenopathy:    She has no cervical adenopathy.  Neurological: She is alert and oriented to person, place, and time. No cranial nerve deficit. She exhibits normal muscle tone. Coordination normal.  Cranial nerves III-XII grossly intact Strength 5+/5+ to upper extremities bilaterally with resistance applied, equal distribution noted Equal grip strength  Skin: Skin is warm and dry. No rash noted. She is not diaphoretic. No erythema.  Psychiatric: She has a normal mood and affect. Her behavior is normal. Thought content normal.    ED Course  Procedures (including critical care time)  Patient has gotten a CT scan with contrast before.   Results for orders placed during the hospital encounter of 05/05/13  WET PREP, GENITAL      Result Value Ref  Range   Yeast Wet Prep HPF POC NONE SEEN  NONE SEEN   Trich, Wet Prep NONE SEEN  NONE SEEN   Clue Cells Wet Prep HPF POC RARE (*) NONE SEEN   WBC, Wet Prep HPF POC RARE (*) NONE SEEN  CBC WITH DIFFERENTIAL      Result Value Ref Range   WBC 10.6 (*) 4.0 - 10.5 K/uL   RBC 5.26 (*) 3.87 - 5.11 MIL/uL   Hemoglobin 10.4 (*) 12.0 - 15.0 g/dL   HCT 45.4 (*) 09.8 - 11.9 %   MCV 63.9 (*) 78.0 - 100.0 fL   MCH 19.8 (*) 26.0 - 34.0 pg   MCHC 31.0  30.0 - 36.0 g/dL   RDW 14.7 (*) 82.9 - 56.2 %   Platelets 318  150 - 400 K/uL   Neutrophils Relative % 54  43 - 77 %   Lymphocytes Relative 38  12 - 46 %   Monocytes Relative 5  3 - 12 %   Eosinophils Relative 2  0 - 5 %   Basophils Relative 1  0 -  1 %   Neutro Abs 5.8  1.7 - 7.7 K/uL   Lymphs Abs 4.0  0.7 - 4.0 K/uL   Monocytes Absolute 0.5  0.1 - 1.0 K/uL   Eosinophils Absolute 0.2  0.0 - 0.7 K/uL   Basophils Absolute 0.1  0.0 - 0.1 K/uL  COMPREHENSIVE METABOLIC PANEL      Result Value Ref Range   Sodium 140  137 - 147 mEq/L   Potassium 4.0  3.7 - 5.3 mEq/L   Chloride 104  96 - 112 mEq/L   CO2 25  19 - 32 mEq/L   Glucose, Bld 113 (*) 70 - 99 mg/dL   BUN 13  6 - 23 mg/dL   Creatinine, Ser 1.61  0.50 - 1.10 mg/dL   Calcium 9.7  8.4 - 09.6 mg/dL   Total Protein 7.9  6.0 - 8.3 g/dL   Albumin 3.6  3.5 - 5.2 g/dL   AST 15  0 - 37 U/L   ALT 14  0 - 35 U/L   Alkaline Phosphatase 101  39 - 117 U/L   Total Bilirubin 0.3  0.3 - 1.2 mg/dL   GFR calc non Af Amer >90  >90 mL/min   GFR calc Af Amer >90  >90 mL/min  URINALYSIS, ROUTINE W REFLEX MICROSCOPIC      Result Value Ref Range   Color, Urine YELLOW  YELLOW   APPearance CLOUDY (*) CLEAR   Specific Gravity, Urine 1.029  1.005 - 1.030   pH 6.0  5.0 - 8.0   Glucose, UA NEGATIVE  NEGATIVE mg/dL   Hgb urine dipstick LARGE (*) NEGATIVE   Bilirubin Urine NEGATIVE  NEGATIVE   Ketones, ur NEGATIVE  NEGATIVE mg/dL   Protein, ur NEGATIVE  NEGATIVE mg/dL   Urobilinogen, UA 1.0  0.0 - 1.0 mg/dL    Nitrite NEGATIVE  NEGATIVE   Leukocytes, UA NEGATIVE  NEGATIVE  PREGNANCY, URINE      Result Value Ref Range   Preg Test, Ur NEGATIVE  NEGATIVE  LIPASE, BLOOD      Result Value Ref Range   Lipase 16  11 - 59 U/L  URINE MICROSCOPIC-ADD ON      Result Value Ref Range   Squamous Epithelial / LPF RARE  RARE   WBC, UA 0-2  <3 WBC/hpf   RBC / HPF 21-50  <3 RBC/hpf   Bacteria, UA FEW (*) RARE   Urine-Other MUCOUS PRESENT      Labs Review Labs Reviewed  WET PREP, GENITAL - Abnormal; Notable for the following:    Clue Cells Wet Prep HPF POC RARE (*)    WBC, Wet Prep HPF POC RARE (*)    All other components within normal limits  CBC WITH DIFFERENTIAL - Abnormal; Notable for the following:    WBC 10.6 (*)    RBC 5.26 (*)    Hemoglobin 10.4 (*)    HCT 33.6 (*)    MCV 63.9 (*)    MCH 19.8 (*)    RDW 19.3 (*)    All other components within normal limits  COMPREHENSIVE METABOLIC PANEL - Abnormal; Notable for the following:    Glucose, Bld 113 (*)    All other components within normal limits  URINALYSIS, ROUTINE W REFLEX MICROSCOPIC - Abnormal; Notable for the following:    APPearance CLOUDY (*)    Hgb urine dipstick LARGE (*)    All other components within normal limits  URINE MICROSCOPIC-ADD ON - Abnormal; Notable for the following:  Bacteria, UA FEW (*)    All other components within normal limits  GC/CHLAMYDIA PROBE AMP  PREGNANCY, URINE  LIPASE, BLOOD   Imaging Review Ct Abdomen Pelvis W Contrast  05/05/2013   CLINICAL DATA:  Abdominal pain.  Nausea and vomiting.  EXAM: CT ABDOMEN AND PELVIS WITH CONTRAST  TECHNIQUE: Multidetector CT imaging of the abdomen and pelvis was performed using the standard protocol following bolus administration of intravenous contrast.  CONTRAST:  50mL OMNIPAQUE IOHEXOL 300 MG/ML SOLN, 100mL OMNIPAQUE IOHEXOL 300 MG/ML SOLN  COMPARISON:  CT of abdomen and pelvis 08/23/2011.  FINDINGS: Lung Bases: Unremarkable.  Abdomen/Pelvis: The appearance of the  liver, gallbladder, pancreas, spleen, bilateral adrenal glands and bilateral kidneys is unremarkable. No significant volume of ascites. No pneumoperitoneum. No pathologic distention of small bowel. No definite lymphadenopathy identified within the abdomen or pelvis. Normal appendix. 1.9 cm low-intermediate attenuation lesion in the lower uterine segment may represent a large nabothian cyst. Ovaries are unremarkable in appearance. Urinary bladder is normal in appearance.  Musculoskeletal: There are no aggressive appearing lytic or blastic lesions noted in the visualized portions of the skeleton.  IMPRESSION: 1. No definite acute findings in the abdomen or pelvis to account for the patient's symptoms. 2. Specifically, the appendix is normal. 3. 1.9 cm low to intermediate attenuation lesion in the lower uterine segment is favored to represent a nabothian cysts.   Electronically Signed   By: Trudie Reedaniel  Entrikin M.D.   On: 05/05/2013 22:59     EKG Interpretation None      MDM   Final diagnoses:  Abdominal pain   Medications  sodium chloride 0.9 % bolus 1,000 mL (0 mLs Intravenous Stopped 05/05/13 2246)  iohexol (OMNIPAQUE) 300 MG/ML solution 50 mL (50 mLs Oral Contrast Given 05/05/13 1941)  ondansetron (ZOFRAN) injection 4 mg (4 mg Intravenous Given 05/05/13 2024)  sodium chloride 0.9 % bolus 1,000 mL (1,000 mLs Intravenous New Bag/Given 05/05/13 2244)  morphine 4 MG/ML injection 4 mg (4 mg Intravenous Given 05/05/13 2244)  ondansetron (ZOFRAN) injection 4 mg (4 mg Intravenous Given 05/05/13 2244)  iohexol (OMNIPAQUE) 300 MG/ML solution 100 mL (100 mLs Intravenous Contrast Given 05/05/13 2228)   Filed Vitals:   05/05/13 1615 05/05/13 1856 05/05/13 2253  BP: 146/81 164/72 130/80  Pulse: 81 80 79  Temp: 98 F (36.7 C) 98.2 F (36.8 C)   TempSrc: Oral Oral   Resp: 20 18 15   SpO2: 98% 100% 99%    Patient presenting to the ED with abdominal pain that started Monday described as a dull aching sensation  at rest and a sharp pain with motion and deep inhalation. Patient reported that the pain mildly radiates to the left side of the back. Patient reported that she has been using Ibuprofen, Tyelnol with minimal relief. Reported that she has been feeling nauseous - reported one episode of emesis yesterday.  Alert and oriented. GCS 15. Heart rate and rhythm normal. Lungs clear to auscultation to upper and lower lobes bilaterally. Radial pulses 2+ bilaterally. Cap refill less than 3 seconds. Obese. Bowel sounds normoactive in all 4 quadrants, discomfort upon palpation to left upper quadrant and left lower quadrant. Positive left-sided CVA tenderness. Negative Murphy sign. Negative McBurney's point. Pelvic exam unremarkable. Discomfort upon palpation to the left adnexal region, negative right adnexal tenderness noted or CMT. CBC noted mild elevated white blood cell count of 10.6. CMP noted-negative findings-liver and kidney functioning well. Lipase negative elevation. Urine pregnancy negative. Urinalysis noted large hemoglobin with negative nitrites  and leukocytes identified-negative pyuria identified in the urine. Wet prep noted rare clue cells and moderate white blood cells-negative trichomoniasis noted. CT abdomen and pelvis with contrast negative for acute findings in the abdomen pelvis-appendix is normal. 1.9 cm low to intermediate situation lesion on the lower uterine segment is favored to represent a nabothian cyst. Doubt ovarian torsion. Doubt TOA. Doubt appendicitis. Doubt pancreatitis. Doubt SBO. Doubt acute inflammatory processes of the abdomen. Negative acute abdominal processes noted on the CT abdomen and pelvis. Incidental nabothian cyst noted to the uterus. Suspicion to be possible passing of stones secondary to positive left-sided CVA tenderness with hematuria-negative findings on CT scan-could be possibly irregular bleeding since patient has history of irregular menstrual cycles. Pain controlled in ED  setting. Patient able to tolerate fluids PO without difficulty. Negative episodes of emesis while in ED setting. Patient stable, afebrile. Discharged patient. Discharged patient with Zofran, pain medications-discussed course, cautions, disposal technique. Referred patient to PCP, OB/GYN, GI. Discussed with patient proper diet. Discussed with patient to rest and stay hydrated. Discussed with patient to closely monitor symptoms and if symptoms are to worsen or change to report back to the ED - strict return instructions given.  Patient agreed to plan of care, understood, all questions answered.   Raymon Mutton, PA-C 05/06/13 1340

## 2013-05-05 NOTE — Progress Notes (Signed)
   CARE MANAGEMENT ED NOTE 05/05/2013  Patient:  Victoria Booth, Victoria Booth   Account Number:  [id number]  Date Initiated:  05/05/2013  Documentation initiated by:  Radford Pax  Subjective/Objective Assessment:   Patient presents to Ed with abdominal pain     Subjective/Objective Assessment Detail:     Action/Plan:   Action/Plan Detail:   Anticipated DC Date:       Status Recommendation to Physician:   Result of Recommendation:    Other ED Services  Consult Working Plan    DC Planning Services  Other  PCP issues    Choice offered to / List presented to:            Status of service:  Completed, signed off  ED Comments:   ED Comments Detail:  Patient confirms she does not have a pcp.  Patient has [employer] and also works for [employer].  Patient stated, "I know I need to find a doctor, I just haven't done it yet, but I will."  [facility] encouraged patient to find pcp ASAP.  EDCM discussed importance of finding a pcp.  Patient verbalized understanding.  No further EDCM needs at this time.

## 2013-05-06 LAB — GC/CHLAMYDIA PROBE AMP
CT Probe RNA: NEGATIVE
GC Probe RNA: NEGATIVE

## 2013-05-06 MED ORDER — ONDANSETRON HCL 4 MG PO TABS: 4.0000 mg | ORAL_TABLET | Freq: Four times a day (QID) | ORAL | Status: DC

## 2013-05-06 MED ORDER — OXYCODONE-ACETAMINOPHEN 5-325 MG PO TABS: 1.0000 | ORAL_TABLET | Freq: Three times a day (TID) | ORAL | Status: DC | PRN

## 2013-05-06 NOTE — ED Provider Notes (Signed)
Medical screening examination/treatment/procedure(s) were performed by non-physician practitioner and as supervising physician I was immediately available for consultation/collaboration.   EKG Interpretation None        Candyce Churn III, MD 05/06/13 [phone]

## 2013-05-06 NOTE — Discharge Instructions (Signed)
Please call your doctor for a followup appointment within 24-48 hours. When you talk to your doctor please let them know that you were seen in the emergency department and have them acquire all of your records so that they can discuss the findings with you and formulate a treatment plan to fully care for your new and ongoing problems. Please rest and stay hydrated Please call and set-up an appointment with your primary care provider, OBGYN, gastroenterology, and urology to be re-assessed Please take medications as prescribed Please take medications as prescribed rash on pain medications his be no drinking alcohol, driving, operating any heavy machinery if there is extra please disposer proper manner. Please do not take any extra Tylenol for this can lead to Tylenol overdose and liver issues. Please avoid any fatty greasy foods Please continue to monitor symptoms closely and if symptoms are to worsen or change (fever greater than 101, chills, nausea, vomiting, chest pain, shortness breath, difficulty breathing, blood in the stools, black tarry stools, numbness, tingling, worsening pain) please report back to the ED immediately   Abdominal Pain, Women Abdominal (stomach, pelvic, or belly) pain can be caused by many things. It is important to tell your doctor:  The location of the pain.  Does it come and go or is it present all the time?  Are there things that start the pain (eating certain foods, exercise)?  Are there other symptoms associated with the pain (fever, nausea, vomiting, diarrhea)? All of this is helpful to know when trying to find the cause of the pain. CAUSES   Stomach: virus or bacteria infection, or ulcer.  Intestine: appendicitis (inflamed appendix), regional ileitis (Crohn's disease), ulcerative colitis (inflamed colon), irritable bowel syndrome, diverticulitis (inflamed diverticulum of the colon), or cancer of the stomach or intestine.  Gallbladder disease or stones in the  gallbladder.  Kidney disease, kidney stones, or infection.  Pancreas infection or cancer.  Fibromyalgia (pain disorder).  Diseases of the female organs:  Uterus: fibroid (non-cancerous) tumors or infection.  Fallopian tubes: infection or tubal pregnancy.  Ovary: cysts or tumors.  Pelvic adhesions (scar tissue).  Endometriosis (uterus lining tissue growing in the pelvis and on the pelvic organs).  Pelvic congestion syndrome (female organs filling up with blood just before the menstrual period).  Pain with the menstrual period.  Pain with ovulation (producing an egg).  Pain with an IUD (intrauterine device, birth control) in the uterus.  Cancer of the female organs.  Functional pain (pain not caused by a disease, may improve without treatment).  Psychological pain.  Depression. DIAGNOSIS  Your doctor will decide the seriousness of your pain by doing an examination.  Blood tests.  X-rays.  Ultrasound.  CT scan (computed tomography, special type of X-ray).  MRI (magnetic resonance imaging).  Cultures, for infection.  Barium enema (dye inserted in the large intestine, to better view it with X-rays).  Colonoscopy (looking in intestine with a lighted tube).  Laparoscopy (minor surgery, looking in abdomen with a lighted tube).  Major abdominal exploratory surgery (looking in abdomen with a large incision). TREATMENT  The treatment will depend on the cause of the pain.   Many cases can be observed and treated at home.  Over-the-counter medicines recommended by your caregiver.  Prescription medicine.  Antibiotics, for infection.  Birth control pills, for painful periods or for ovulation pain.  Hormone treatment, for endometriosis.  Nerve blocking injections.  Physical therapy.  Antidepressants.  Counseling with a psychologist or psychiatrist.  Minor or major surgery.  HOME CARE INSTRUCTIONS   Do not take laxatives, unless directed by your  caregiver.  Take over-the-counter pain medicine only if ordered by your caregiver. Do not take aspirin because it can cause an upset stomach or bleeding.  Try a clear liquid diet (broth or water) as ordered by your caregiver. Slowly move to a bland diet, as tolerated, if the pain is related to the stomach or intestine.  Have a thermometer and take your temperature several times a day, and record it.  Bed rest and sleep, if it helps the pain.  Avoid sexual intercourse, if it causes pain.  Avoid stressful situations.  Keep your follow-up appointments and tests, as your caregiver orders.  If the pain does not go away with medicine or surgery, you may try:  Acupuncture.  Relaxation exercises (yoga, meditation).  Group therapy.  Counseling. SEEK MEDICAL CARE IF:   You notice certain foods cause stomach pain.  Your home care treatment is not helping your pain.  You need stronger pain medicine.  You want your IUD removed.  You feel faint or lightheaded.  You develop nausea and vomiting.  You develop a rash.  You are having side effects or an allergy to your medicine. SEEK IMMEDIATE MEDICAL CARE IF:   Your pain does not go away or gets worse.  You have a fever.  Your pain is felt only in portions of the abdomen. The right side could possibly be appendicitis. The left lower portion of the abdomen could be colitis or diverticulitis.  You are passing blood in your stools (bright red or black tarry stools, with or without vomiting).  You have blood in your urine.  You develop chills, with or without a fever.  You pass out. MAKE SURE YOU:   Understand these instructions.  Will watch your condition.  Will get help right away if you are not doing well or get worse. Document Released: 11/25/2006 Document Revised: 04/22/2011 Document Reviewed: 12/15/2008 [facility] Patient Information 2014 [place], [place].   Emergency Department Resource Guide 1) Find a Doctor and  Pay Out of [place] Although you won't have to find out who is covered by your insurance plan, it is a good idea to ask around and get recommendations. You will then need to call the office and see if the doctor you have chosen will accept you as a new patient and what types of options they offer for patients who are self-pay. Some doctors offer discounts or will set up payment plans for their patients who do not have insurance, but you will need to ask so you aren't surprised when you get to your appointment.  2) Contact Your Local Health Department Not all health departments have doctors that can see patients for sick visits, but many do, so it is worth a call to see if yours does. If you don't know where your local health department is, you can check in your phone book. The CDC also has a tool to help you locate your state's health department, and many state websites also have listings of all of their local health departments.  3) Find a Walk-in Clinic If your illness is not likely to be very severe or complicated, you may want to try a walk in clinic. These are popping up all over the country in pharmacies, drugstores, and shopping centers. They're usually staffed by nurse practitioners or physician assistants that have been trained to treat common illnesses and complaints. They're usually fairly quick and inexpensive. However, if you  have serious medical issues or chronic medical problems, these are probably not your best option.  No Primary Care Doctor: - Call Health Connect at  [phone] - they can help you locate a primary care doctor that  accepts your insurance, provides certain services, etc. - Physician Referral Service- [phone]  Chronic Pain Problems: Organization         Address  Phone   Notes  [facility]  [phone] Patients need to be referred by their primary care doctor.   Medication Assistance: Organization         Address  Phone   Notes  [facility] Medication [facility] [address] [place] [place], [place] [address] [phone] --Must be a resident of [facility] -- Must have NO insurance coverage whatsoever (no Medicaid/ Medicare, etc.) -- The pt. MUST have a primary care doctor that directs their care regularly and follows them in the community   MedAssist  [phone]   [employer]  [phone]    Agencies that provide inexpensive medical care: Organization         Address  Phone   Notes  [facility]  [phone]   [facility]    [phone]   [facility] [address] [place], [place] [address] [phone]   [facility] [address] [place], [place] [phone]   Planned Parenthood    [phone]   [facility]    [phone]   [facility]  [address], [place] Phone:  [phone], Fax:  [phone] Hours of Operation:  9 am - 6 pm, M-F.  Also accepts Medicaid/Medicare and self-pay.  [facility]  [address], [place] Phone: [phone], Fax: [phone]. Hours of Operation:  8:30 am - 5:30 pm, M-F.  Also accepts Medicaid and self-pay.  [facility] [place] [address] [place] Phone: [phone]   [facility] [address] Natasha Bence [place], [place] [phone], Ext. [phone] Mondays & Thursdays: 7-9 AM.  First 15 patients are seen on a first come, first serve basis.    Medicaid-accepting [facility] Providers:  Organization         Address  Phone   Notes  [facility] [address], [place] [phone] Also accepts self-pay patients.  [facility] [address] Laurell Josephs [place], [place]  [phone]   [facility] [address], [place] [phone]   [facility] [address] [place] [phone]   Renaye Rakers [address], [place]   [phone] Only accepts [place] Access [place] patients after they have their name applied to their card.   Self-Pay (no insurance) in [facility]:  Organization         Address  Phone   Notes  Sickle Cell Patients, [facility] [address] [place], [place] [phone]   [facility] [address] [place], [place] [phone]   [facility] [place]  [address], [place] [phone]   [facility]/Dr. Osei-Bonsu  [address] [place] or [address], [place] [phone] Phone number for both [employer] and [place]  locations is the same.  [facility] [address], [place] [phone]   [facility] [place] [address] [place] [address], [place] or [address] [phone]   [facility] [address], [place] [phone], phone; [phone], fax Sees patients 1st and 3rd Saturday of every month.  Must not qualify for public or private insurance (i.e. Medicaid, Medicare, [place] Health Choice, Veterans' Benefits)  Household income should be no more than 200% of the poverty level The clinic cannot treat you if you are pregnant or think you are pregnant  Sexually transmitted diseases are not treated at the clinic.    Dental Care: Organization         Address  Phone  Notes  [facility] [address], [place] [phone] Accepts children up to age 32 who are enrolled in [place] or [place] Health Choice; pregnant women with a Medicaid card; and children who have applied for Medicaid or [place] Health Choice, but were declined, whose parents can pay a reduced fee at time of service.  [facility] [place]  [address], [place] [phone]) [phone] Accepts children up to age 32 who are enrolled in [place] or [place] Health Choice; pregnant women with a Medicaid card; and children who have applied for Medicaid or  Health Choice, but were declined, whose parents can pay a reduced fee at time of service.  [facility]  [address], [place] [phone] Patients are seen by appointment only. Walk-ins are not accepted. [facility] will see patients 32 years of age and older. Monday - Tuesday (8am-5pm) Most Wednesdays (8:30-5pm) $30 per visit, cash only  [facility]  [address], [facility] [place] [phone] Patients are seen by appointment only. Walk-ins are not accepted. [facility] will see patients 32 years of age and older. One Wednesday Evening (Monthly: Volunteer Based).  $30 per visit, cash only  [employer] [facility] [facility]  [phone] for adults; Children under age 994, call Graduate Pediatric Dentistry at [phone]. Children aged 734-14, please call [phone] to request a pediatric application.  Dental services are provided in all areas of dental care including fillings, crowns and bridges, complete and partial dentures, implants, gum treatment, root canals, and extractions. Preventive care is also provided. Treatment is provided to both adults and children. Patients are selected via a lottery and there is often a waiting list.   [facility] [address], [place]  [phone] [email or web address]   [facility] [address], [place], [place] [phone], Ext. [phone] Second and Fourth Thursday of each month, opens at 6:30 AM; Clinic ends at 9 AM.  Patients are seen on a first-come first-served basis, and a limited number are seen during each clinic.   [facility]  [address], [place], [place] [phone]   Eligibility Requirements You must have lived in [place], [place], or  [place] counties for at least the last three months.   You cannot be eligible for state or federal sponsored [place]healthcare insurance, including CIGNAVeterans Administration, [place]icaid, or [employer]icare.   You generally cannot be eligible for healthcare insurance through your employer.    How to apply: Eligibility screenings are held every Tuesday and Wednesday afternoon from 1:00 pm until 4:00 pm. You do not need  an appointment for the interview!  [facility] [address] [place], [place] [phone]   [facility] Health Department  [phone]   [facility] Department  [phone]   [facility]  [phone]    Behavioral Health Resources in the Community: Intensive Outpatient Programs Organization         Address  Phone  Notes  [facility] [address] [place], [place] [phone]   [facility] [address] [place], [place] [phone]   [facility] [address] [place], [place]  [phone]   [facility] [address]  [place], [place] [phone] or [phone]   Substance Abuse Resources Organization         Address  Phone  Notes  [facility]  [phone]   [facility]  [phone]   [facility]  [phone]   Floydene Flock  [phone]   Residential & Outpatient Substance Abuse Program  [phone]   Psychological Services Organization         Address  Phone  Notes  [facility] Health  [phone]   [facility]  [phone]   [facility] [address], [place] [phone] or [phone]    Mobile Crisis Teams Organization         Address  Phone  Notes  [facility], Mobile Crisis Care Unit  [phone]   [facility]  [address] [place], [place] [phone]   Doristine Locks [address] [place] [phone]    Self-Help/Support Groups Organization         Address  Phone             Notes  [facility] [place] - variety of support groups  [phone]- I7437963 Call for more information  Narcotics Anonymous (NA), [facility] [address], [employer] [place]  2 meetings at this location   Statistician         Address  Phone  Notes  [facility] [address],    [place]  [phone]   [facility]  [address], [place] [address], [place], [place] [phone]   [facility] [address] [place], [place] [phone] Admissions: 8am-3pm M-F  [facility] [address]    [place], [place] [phone]   [facility] [address] [place], [place], [place] [phone]   [facility] [address]  [place], [place] [phone]   [facility] - Intensive Outpatient [address] Laurell Josephs [address], [place], [place] [phone]   [facility] ([facility] [address] [place]  [place], [place] [phone] or [phone]   [facility] ([facility]) [address], [place], [place] [phone] Accepts Medicaid  [facility] [address]  [place] [phone] Substance Abuse/Addiction Treatment   [facility] Organization         Address  Phone  Notes  [facility]  [phone]   Angie Fava, PhD [address] Ervin Knack [place], [place]   [phone] or [phone]   [facility]al   [address] [place], [place] [phone]   [facility] [address] [place], [place] [phone] Insurance/Medicaid/sponsorship through [employer] and Families [address]  [place], [place] [phone] Therapy/tele-psych/case  °[facility] [address]  ° [place], [place] [phone]     °Dr. Arfeen  [phone]   °[facility] [place]  [employer] [place] [facility] 1) [address], [place] °2) [address], [place] °3)  [address], [place] [phone] °([phone] ° °([phone]   °[place] Child Abuse Hotline [phone]) 342-1394 or ([phone] (After Hours)    ° ° ° °

## 2013-05-27 ENCOUNTER — Emergency Department (HOSPITAL_BASED_OUTPATIENT_CLINIC_OR_DEPARTMENT_OTHER)
Admission: EM | Admit: 2013-05-27 | Discharge: 2013-05-27 | Disposition: A | Discharge status: Home / Self Care | Payer: Managed Care, Other (non HMO) | Attending: Emergency Medicine | Admitting: Emergency Medicine

## 2013-05-27 ENCOUNTER — Encounter (HOSPITAL_BASED_OUTPATIENT_CLINIC_OR_DEPARTMENT_OTHER): Payer: Self-pay | Admitting: Emergency Medicine

## 2013-05-27 DIAGNOSIS — J45909 Unspecified asthma, uncomplicated: Secondary | ICD-10-CM | POA: Insufficient documentation

## 2013-05-27 DIAGNOSIS — Z79899 Other long term (current) drug therapy: Secondary | ICD-10-CM | POA: Insufficient documentation

## 2013-05-27 DIAGNOSIS — Z87891 Personal history of nicotine dependence: Secondary | ICD-10-CM | POA: Insufficient documentation

## 2013-05-27 DIAGNOSIS — L509 Urticaria, unspecified: Secondary | ICD-10-CM

## 2013-05-27 MED ORDER — DIPHENHYDRAMINE HCL 25 MG PO CAPS
25.0000 mg | ORAL_CAPSULE | Freq: Once | ORAL | Status: DC
  Filled 2013-05-27: qty 1

## 2013-05-27 MED ORDER — PREDNISONE 50 MG PO TABS
60.0000 mg | ORAL_TABLET | Freq: Once | ORAL | Status: AC
  Administered 2013-05-27: 60 mg via ORAL
  Filled 2013-05-27 (×2): qty 1

## 2013-05-27 NOTE — ED Provider Notes (Signed)
CSN: [id number]     Arrival date & time 05/27/13  1443 History   First MD Initiated Contact with Patient 05/27/13 1456     Chief Complaint  Patient presents with  . Rash     (Consider location/radiation/quality/duration/timing/severity/associated sxs/prior Treatment) HPI Comments: Pt states that she had these areas come up in the last 2 days. Hasn't taken any medication. Has a history of similar rash in the past  Patient is a 33 y.o. female presenting with rash. The history is provided by the patient.  Rash Location:  Full body Onset quality:  Gradual Duration:  2 days Timing:  Constant Relieved by:  Nothing Worsened by:  Nothing tried Ineffective treatments:  None tried Associated symptoms: no fever, no sore throat, no throat swelling, no tongue swelling and no URI     Past Medical History  Diagnosis Date  . Asthma    Past Surgical History  Procedure Laterality Date  . Toenail excision     Family History  Problem Relation Age of Onset  . Diabetes Mother   . Hypertension Mother    History  Substance Use Topics  . Smoking status: Former Smoker    Types: Cigarettes  . Smokeless tobacco: Not on file  . Alcohol Use: No   OB History   Grav Para Term Preterm Abortions TAB SAB Ect Mult Living                 Review of Systems  Constitutional: Negative for fever.  HENT: Negative for sore throat.   Respiratory: Negative.   Cardiovascular: Negative.   Skin: Positive for rash.      Allergies  Shellfish allergy and Sulfa antibiotics  Home Medications   Prior to Admission medications   Medication Sig Start Date End Date Taking? Authorizing Provider  albuterol (PROVENTIL HFA;VENTOLIN HFA) 108 (90 BASE) MCG/ACT inhaler Inhale 2 puffs into the lungs every 6 (six) hours as needed. wheezing     Historical Provider, MD  ibuprofen (ADVIL,MOTRIN) 200 MG tablet Take 400 mg by mouth every 6 (six) hours as needed. Pain jaw    Historical Provider, MD  ondansetron (ZOFRAN) 4  MG tablet Take 1 tablet (4 mg total) by mouth every 6 (six) hours. 05/06/13   Marissa Sciacca, PA-C  oxyCODONE-acetaminophen (PERCOCET/ROXICET) 5-325 MG per tablet Take 1 tablet by mouth every 8 (eight) hours as needed for severe pain. 05/06/13   Marissa Sciacca, PA-C   BP 156/94  Pulse 77  Temp(Src) 97.7 F (36.5 C) (Oral)  Resp 16  Ht 5\' 6"  (1.676 m)  Wt 240 lb (108.863 kg)  BMI 38.76 kg/m2  SpO2 99%  LMP 05/19/2013 Physical Exam  Nursing note and vitals reviewed. Constitutional: She is oriented to person, place, and time. She appears well-developed and well-nourished.  Cardiovascular: Normal rate and regular rhythm.   Pulmonary/Chest: Effort normal and breath sounds normal.  Neurological: She is alert and oriented to person, place, and time.  Skin:  Red patches noted to the areas all over the body    ED Course  Procedures (including critical care time) Labs Review Labs Reviewed - No data to display  Imaging Review No results found.   EKG Interpretation None      MDM   Final diagnoses:  Hives   No known new exposures. Pt can take benadryl at home   Teressa Lower, NP 05/27/13 1531

## 2013-05-27 NOTE — ED Notes (Signed)
Pt c/o rash to entire body x 2 days

## 2013-05-27 NOTE — ED Provider Notes (Signed)
Medical screening examination/treatment/procedure(s) were performed by non-physician practitioner and as supervising physician I was immediately available for consultation/collaboration.     Geoffery Lyons, MD 05/27/13 [phone]

## 2013-05-27 NOTE — Discharge Instructions (Signed)
Continue to use benadryl Hives Hives are itchy, red, puffy (swollen) areas of the skin. Hives can change in size and location on your body. Hives can come and go for hours, days, or weeks. Hives do not spread from person to person (noncontagious). Scratching, exercise, and stress can make your hives worse. HOME CARE  Avoid things that cause your hives (triggers).  Take antihistamine medicines as told by your doctor. Do not drive while taking an antihistamine.  Take any other medicines for itching as told by your doctor.  Wear loose-fitting clothing.  Keep all doctor visits as told. GET HELP RIGHT AWAY IF:   You have a fever.  Your tongue or lips are puffy.  You have trouble breathing or swallowing.  You feel tightness in the throat or chest.  You have belly (abdominal) pain.  You have lasting or severe itching that is not helped by medicine.  You have painful or puffy joints. These problems may be the first sign of a life-threatening allergic reaction. Call your local emergency services (911 in [place] MAKE SURE YOU:   Understand these instructions.  Will watch your condition.  Will get help right away if you are not doing well or get worse. Document Released: 11/07/2007 Document Revised: 07/30/2011 Document Reviewed: 04/23/2011 [facility] Patient Information 2014 [place], [place].

## 2013-07-29 ENCOUNTER — Encounter (HOSPITAL_BASED_OUTPATIENT_CLINIC_OR_DEPARTMENT_OTHER): Payer: Self-pay | Admitting: Emergency Medicine

## 2013-07-29 ENCOUNTER — Emergency Department (HOSPITAL_BASED_OUTPATIENT_CLINIC_OR_DEPARTMENT_OTHER)
Admission: EM | Admit: 2013-07-29 | Discharge: 2013-07-29 | Disposition: A | Discharge status: Home / Self Care | Payer: Managed Care, Other (non HMO) | Attending: Emergency Medicine | Admitting: Emergency Medicine

## 2013-07-29 DIAGNOSIS — Z79899 Other long term (current) drug therapy: Secondary | ICD-10-CM | POA: Insufficient documentation

## 2013-07-29 DIAGNOSIS — I889 Nonspecific lymphadenitis, unspecified: Secondary | ICD-10-CM | POA: Insufficient documentation

## 2013-07-29 DIAGNOSIS — J45909 Unspecified asthma, uncomplicated: Secondary | ICD-10-CM | POA: Insufficient documentation

## 2013-07-29 DIAGNOSIS — Z87891 Personal history of nicotine dependence: Secondary | ICD-10-CM | POA: Insufficient documentation

## 2013-07-29 MED ORDER — AMOXICILLIN 500 MG PO CAPS: 500.0000 mg | ORAL_CAPSULE | Freq: Three times a day (TID) | ORAL | Status: DC

## 2013-07-29 NOTE — Discharge Instructions (Signed)
Take amoxicillin as directed until gone. Refer to attached documents for more information. Return to the ED with worsening or concerning symptoms.  °

## 2013-07-29 NOTE — ED Provider Notes (Signed)
CSN: [id number]     Arrival date & time 07/29/13  1430 History   First MD Initiated Contact with Patient 07/29/13 1449     Chief Complaint  Patient presents with  . Torticollis     (Consider location/radiation/quality/duration/timing/severity/associated sxs/prior Treatment) HPI Comments: Patient is a 33 year old female who presents with a 4 day history of right anterior neck pain. Symptoms started gradually and progressively worsened since the onset. The pain is aching and moderate without radiation. No other associated symptoms. Patient has tried tylenol for pain without relief. Palpation of the area and neck movement makes the pain worse. No alleviating factors.    Past Medical History  Diagnosis Date  . Asthma    Past Surgical History  Procedure Laterality Date  . Toenail excision     Family History  Problem Relation Age of Onset  . Diabetes Mother   . Hypertension Mother    History  Substance Use Topics  . Smoking status: Former Smoker    Types: Cigarettes  . Smokeless tobacco: Not on file  . Alcohol Use: No   OB History   Grav Para Term Preterm Abortions TAB SAB Ect Mult Living                 Review of Systems  Constitutional: Negative for fever, chills and fatigue.  HENT: Negative for trouble swallowing.        Lymphadenopathy  Eyes: Negative for visual disturbance.  Respiratory: Negative for shortness of breath.   Cardiovascular: Negative for chest pain and palpitations.  Gastrointestinal: Negative for nausea, vomiting, abdominal pain and diarrhea.  Genitourinary: Negative for dysuria and difficulty urinating.  Musculoskeletal: Negative for arthralgias and neck pain.  Skin: Negative for color change.  Neurological: Negative for dizziness and weakness.  Psychiatric/Behavioral: Negative for dysphoric mood.      Allergies  Shellfish allergy and Sulfa antibiotics  Home Medications   Prior to Admission medications   Medication Sig Start Date End Date  Taking? Authorizing Yun Gutierrez  albuterol (PROVENTIL HFA;VENTOLIN HFA) 108 (90 BASE) MCG/ACT inhaler Inhale 2 puffs into the lungs every 6 (six) hours as needed. wheezing     Historical Dinna Severs, MD  ibuprofen (ADVIL,MOTRIN) 200 MG tablet Take 400 mg by mouth every 6 (six) hours as needed. Pain jaw    Historical Vanessa Alesi, MD  ondansetron (ZOFRAN) 4 MG tablet Take 1 tablet (4 mg total) by mouth every 6 (six) hours. 05/06/13   Marissa Sciacca, PA-C  oxyCODONE-acetaminophen (PERCOCET/ROXICET) 5-325 MG per tablet Take 1 tablet by mouth every 8 (eight) hours as needed for severe pain. 05/06/13   Marissa Sciacca, PA-C   BP 166/85  Pulse 72  Resp 16  Ht 5\' 6"  (1.676 m)  Wt 228 lb (103.42 kg)  BMI 36.82 kg/m2  SpO2 100%  LMP 06/25/2013 Physical Exam  Nursing note and vitals reviewed. Constitutional: She is oriented to person, place, and time. She appears well-developed and well-nourished. No distress.  HENT:  Head: Normocephalic and atraumatic.  Right Ear: External ear normal.  Left Ear: External ear normal.  Mouth/Throat: Oropharynx is clear and moist. No oropharyngeal exudate.  Bilateral TM intact with reflex present. No tooth tenderness to percussion.  Eyes: Conjunctivae and EOM are normal.  Neck: Normal range of motion.  Right anterior cervical lymphadenopathy and tenderness to palpation.   Cardiovascular: Normal rate and regular rhythm.  Exam reveals no gallop and no friction rub.   No murmur heard. Pulmonary/Chest: Effort normal and breath sounds normal. She has  no wheezes. She has no rales. She exhibits no tenderness.  Musculoskeletal: Normal range of motion.  Lymphadenopathy:    She has cervical adenopathy.  Neurological: She is alert and oriented to person, place, and time.  No meningeal signs. Speech is goal-oriented. Moves limbs without ataxia.   Skin: Skin is warm and dry.  Psychiatric: She has a normal mood and affect. Her behavior is normal.    ED Course  Procedures  (including critical care time) Labs Review Labs Reviewed - No data to display  Imaging Review No results found.   EKG Interpretation None      MDM   Final diagnoses:  Cervical lymphadenitis    3:07 PM Patient has unilateral cervical lymphadenopathy for the past 4 days that is not improving. I will start patient on amoxicillin for possible bacterial infection. Patient instructed to take ibuprofen/tylenol for fever and pain as needed.     Emilia BeckKaitlyn Szekalski, PA-C 07/29/13 [id number]

## 2013-07-29 NOTE — ED Notes (Signed)
Pt. Reports R neck pain with edema in the R neck.  Pt. Reports it actually started on Monday being sore.  Pt. Is having no trouble swallowing and no trouble chewing.  Pt. Has had most teeth pulled in the R lower quadrant.

## 2013-07-30 NOTE — ED Provider Notes (Signed)
Medical screening examination/treatment/procedure(s) were performed by non-physician practitioner and as supervising physician I was immediately available for consultation/collaboration.   Shanna Cisco, MD 07/30/13 [phone]

## 2013-08-07 ENCOUNTER — Emergency Department (HOSPITAL_COMMUNITY): Payer: Managed Care, Other (non HMO)

## 2013-08-07 ENCOUNTER — Emergency Department (HOSPITAL_COMMUNITY)
Admission: EM | Admit: 2013-08-07 | Discharge: 2013-08-07 | Disposition: A | Discharge status: Home / Self Care | Payer: Managed Care, Other (non HMO) | Attending: Emergency Medicine | Admitting: Emergency Medicine

## 2013-08-07 ENCOUNTER — Encounter (HOSPITAL_COMMUNITY): Payer: Self-pay | Admitting: Emergency Medicine

## 2013-08-07 DIAGNOSIS — Z792 Long term (current) use of antibiotics: Secondary | ICD-10-CM | POA: Insufficient documentation

## 2013-08-07 DIAGNOSIS — R52 Pain, unspecified: Secondary | ICD-10-CM | POA: Insufficient documentation

## 2013-08-07 DIAGNOSIS — E669 Obesity, unspecified: Secondary | ICD-10-CM | POA: Insufficient documentation

## 2013-08-07 DIAGNOSIS — R109 Unspecified abdominal pain: Secondary | ICD-10-CM | POA: Insufficient documentation

## 2013-08-07 DIAGNOSIS — Z791 Long term (current) use of non-steroidal anti-inflammatories (NSAID): Secondary | ICD-10-CM | POA: Insufficient documentation

## 2013-08-07 DIAGNOSIS — N200 Calculus of kidney: Secondary | ICD-10-CM | POA: Insufficient documentation

## 2013-08-07 DIAGNOSIS — Z87891 Personal history of nicotine dependence: Secondary | ICD-10-CM | POA: Insufficient documentation

## 2013-08-07 DIAGNOSIS — J45909 Unspecified asthma, uncomplicated: Secondary | ICD-10-CM | POA: Insufficient documentation

## 2013-08-07 DIAGNOSIS — Z3202 Encounter for pregnancy test, result negative: Secondary | ICD-10-CM | POA: Insufficient documentation

## 2013-08-07 DIAGNOSIS — Z79899 Other long term (current) drug therapy: Secondary | ICD-10-CM | POA: Insufficient documentation

## 2013-08-07 HISTORY — DX: Obesity, unspecified: E66.9

## 2013-08-07 LAB — URINALYSIS, ROUTINE W REFLEX MICROSCOPIC
BILIRUBIN URINE: NEGATIVE
Glucose, UA: NEGATIVE mg/dL
Ketones, ur: NEGATIVE mg/dL
Leukocytes, UA: NEGATIVE
NITRITE: NEGATIVE
PH: 6.5 (ref 5.0–8.0)
Protein, ur: NEGATIVE mg/dL
SPECIFIC GRAVITY, URINE: 1.021 (ref 1.005–1.030)
Urobilinogen, UA: 1 mg/dL (ref 0.0–1.0)

## 2013-08-07 LAB — URINE MICROSCOPIC-ADD ON

## 2013-08-07 LAB — PREGNANCY, URINE: Preg Test, Ur: NEGATIVE

## 2013-08-07 MED ORDER — HYDROCODONE-ACETAMINOPHEN 5-325 MG PO TABS: 2.0000 | ORAL_TABLET | ORAL | Status: DC | PRN

## 2013-08-07 MED ORDER — KETOROLAC TROMETHAMINE 60 MG/2ML IM SOLN
60.0000 mg | Freq: Once | INTRAMUSCULAR | Status: AC
  Administered 2013-08-07: 60 mg via INTRAMUSCULAR
  Filled 2013-08-07: qty 2

## 2013-08-07 MED ORDER — CYCLOBENZAPRINE HCL 10 MG PO TABS
10.0000 mg | ORAL_TABLET | Freq: Once | ORAL | Status: AC
  Administered 2013-08-07: 10 mg via ORAL
  Filled 2013-08-07: qty 1

## 2013-08-07 NOTE — ED Notes (Signed)
Pt. reports left lower back pain onset Wednesday , denies injury or fall , no urinary discomfort or hematuria , pain worse when breathing , movement and certain positions .

## 2013-08-07 NOTE — ED Provider Notes (Signed)
CSN: [id number]     Arrival date & time 08/07/13  5597 History   First MD Initiated Contact with Patient 08/07/13 0505     Chief Complaint  Patient presents with  . Back Pain     (Consider location/radiation/quality/duration/timing/severity/associated sxs/prior Treatment) HPI Comments: 33 year old female with past smoker, obesity, asthma presents with left flank pain intermittent. Patient had episode of significant pain throughout the day and Monday that resolved on Tuesday and then return on Wednesday is gradually worsened since. Worse with movement and certain positions however fairly constant as well. Patient does not have shortness breath or chest pain. No recent surgeries, leg swelling leg pain, active cancer, IV drug use, urinary changes, leg weakness or numbness. No injuries that she recalls. No history of sciatica or back surgery  Patient is a 33 y.o. female presenting with back pain. The history is provided by the patient.  Back Pain Associated symptoms: no abdominal pain, no chest pain, no dysuria, no fever, no headaches, no numbness and no weakness     Past Medical History  Diagnosis Date  . Asthma   . Obesity    Past Surgical History  Procedure Laterality Date  . Toenail excision     Family History  Problem Relation Age of Onset  . Diabetes Mother   . Hypertension Mother    History  Substance Use Topics  . Smoking status: Former Smoker    Types: Cigarettes  . Smokeless tobacco: Not on file  . Alcohol Use: No   OB History   Grav Para Term Preterm Abortions TAB SAB Ect Mult Living                 Review of Systems  Constitutional: Negative for fever and chills.  Respiratory: Negative for shortness of breath.   Cardiovascular: Negative for chest pain.  Gastrointestinal: Negative for vomiting and abdominal pain.  Genitourinary: Positive for flank pain. Negative for dysuria.  Musculoskeletal: Positive for back pain. Negative for neck pain and neck stiffness.   Skin: Negative for rash.  Neurological: Negative for weakness, light-headedness, numbness and headaches.      Allergies  Shellfish allergy and Sulfa antibiotics  Home Medications   Prior to Admission medications   Medication Sig Start Date End Date Taking? Authorizing Provider  albuterol (PROVENTIL HFA;VENTOLIN HFA) 108 (90 BASE) MCG/ACT inhaler Inhale 2 puffs into the lungs every 6 (six) hours as needed. wheezing     Historical Provider, MD  amoxicillin (AMOXIL) 500 MG capsule Take 1 capsule (500 mg total) by mouth 3 (three) times daily. 07/29/13   Kaitlyn Szekalski, PA-C  ibuprofen (ADVIL,MOTRIN) 200 MG tablet Take 400 mg by mouth every 6 (six) hours as needed. Pain jaw    Historical Provider, MD   BP 154/89  Pulse 84  Temp(Src) 98.2 F (36.8 C) (Oral)  Resp 20  SpO2 100%  LMP 06/25/2013 Physical Exam  Nursing note and vitals reviewed. Constitutional: She is oriented to person, place, and time. She appears well-developed and well-nourished.  HENT:  Head: Normocephalic and atraumatic.  Eyes: Conjunctivae are normal. Right eye exhibits no discharge. Left eye exhibits no discharge.  Neck: Normal range of motion. Neck supple. No tracheal deviation present.  Cardiovascular: Normal rate and regular rhythm.   Pulmonary/Chest: Effort normal and breath sounds normal.  Abdominal: Soft. She exhibits no distension. There is no tenderness. There is no guarding.  Musculoskeletal: She exhibits tenderness. She exhibits no edema.  Mild tender left flank, no midline vertebral tenderness in lumbar  or lower thoracic region  Neurological: She is alert and oriented to person, place, and time.  Reflex Scores:      Patellar reflexes are 2+ on the right side and 2+ on the left side.      Achilles reflexes are 1+ on the right side and 1+ on the left side. Patient has bipolar strength in flexion extension of hips knees and great toes bilateral. Sensation intact bilateral major nerves.  Skin: Skin is  warm. No rash noted.  Psychiatric: She has a normal mood and affect.    ED Course  Procedures (including critical care time) Labs Review Labs Reviewed  URINALYSIS, ROUTINE W REFLEX MICROSCOPIC - Abnormal; Notable for the following:    APPearance CLOUDY (*)    Hgb urine dipstick LARGE (*)    All other components within normal limits  URINE MICROSCOPIC-ADD ON - Abnormal; Notable for the following:    Squamous Epithelial / LPF FEW (*)    All other components within normal limits  PREGNANCY, URINE    Imaging Review Ct Abdomen Pelvis Wo Contrast  08/07/2013   CLINICAL DATA:  left flank pain  EXAM: CT ABDOMEN AND PELVIS WITHOUT CONTRAST  TECHNIQUE: Multidetector CT imaging of the abdomen and pelvis was performed following the standard protocol without IV contrast.  COMPARISON:  05/05/2013  FINDINGS: Small focus of subsegmental atelectasis versus airspace infiltrate in the visualized medial basal segment right lower lobe. Unremarkable liver, nondilated gallbladder, spleen, adrenal glands pancreas, right kidney, aorta. Unenhanced CT was performed per clinician order. Lack of IV contrast limits sensitivity and specificity, especially for evaluation of abdominal/pelvic solid viscera.  There is a 5 mm calculus centrally in the left renal collecting system. No hydronephrosis or ureterectasis. Urinary bladder is not distended.  Stomach, small bowel, and colon are nondilated. Appendix normal. No ascites. No free air. Uterus and adnexal regions unremarkable. No adenopathy localized. Tiny umbilical hernia containing only mesenteric fat. Regional bones unremarkable.  IMPRESSION: 1. 5 mm calculus centrally in the left renal collecting system without hydronephrosis.   Electronically Signed   By: Oley Balmaniel  Hassell M.D.   On: 08/07/2013 08:06     EKG Interpretation None      MDM   Final diagnoses:  Kidney stone on left side  Acute left flank pain   With mild radiation left flank towards box discussed  differential for musculoskeletal/sciatica versus kidney stone. Pain medicines and muscle action given in ER and CT stone study ordered.  UA with hematuria and patient is not on her menstrual cycle. CT scan delayed however results eventually showed 5 mm kidney stone. Pain controlled on recheck and followup with urology discussed.  Results and differential diagnosis were discussed with the patient/parent/guardian. Close follow up outpatient was discussed, comfortable with the plan.   Medications  ketorolac (TORADOL) injection 60 mg (60 mg Intramuscular Given 08/07/13 0536)  cyclobenzaprine (FLEXERIL) tablet 10 mg (10 mg Oral Given 08/07/13 0536)    Filed Vitals:   08/07/13 0354  BP: 154/89  Pulse: 84  Temp: 98.2 F (36.8 C)  TempSrc: Oral  Resp: 20  SpO2: 100%         Enid SkeensJoshua M Zavitz, MD 08/07/13 [phone]

## 2013-08-07 NOTE — Discharge Instructions (Signed)
Take ibuprofen every 6 hours for pain. For severe pain take norco or vicodin however realize they have the potential for addiction and it can make you sleepy and has tylenol in it.  No operating machinery while taking. Strain urine. If you were given medicines take as directed.  If you are on coumadin or contraceptives realize their levels and effectiveness is altered by many different medicines.  If you have any reaction (rash, tongues swelling, other) to the medicines stop taking and see a physician.   Please follow up as directed and return to the ER or see a physician for new or worsening symptoms.  Thank you. Filed Vitals:   08/07/13 0354 08/07/13 0600 08/07/13 0751 08/07/13 0757  BP: 154/89 111/58 115/62 129/74  Pulse: 84 61 72   Temp: 98.2 F (36.8 C)     TempSrc: Oral     Resp: 20 16 18 23   SpO2: 100% 100% 99% 97%

## 2013-10-22 ENCOUNTER — Encounter (HOSPITAL_COMMUNITY): Payer: Self-pay | Admitting: Emergency Medicine

## 2013-10-22 ENCOUNTER — Emergency Department (INDEPENDENT_AMBULATORY_CARE_PROVIDER_SITE_OTHER)
Admission: EM | Admit: 2013-10-22 | Discharge: 2013-10-22 | Disposition: A | Discharge status: Home / Self Care | Payer: Managed Care, Other (non HMO) | Source: Home / Self Care | Attending: Emergency Medicine | Admitting: Emergency Medicine

## 2013-10-22 DIAGNOSIS — K05219 Aggressive periodontitis, localized, unspecified severity: Secondary | ICD-10-CM

## 2013-10-22 DIAGNOSIS — K0520 Aggressive periodontitis, unspecified: Secondary | ICD-10-CM

## 2013-10-22 DIAGNOSIS — K029 Dental caries, unspecified: Secondary | ICD-10-CM

## 2013-10-22 DIAGNOSIS — S025XXD Fracture of tooth (traumatic), subsequent encounter for fracture with routine healing: Secondary | ICD-10-CM

## 2013-10-22 MED ORDER — KETOROLAC TROMETHAMINE 60 MG/2ML IM SOLN
60.0000 mg | Freq: Once | INTRAMUSCULAR | Status: AC
  Administered 2013-10-22: 60 mg via INTRAMUSCULAR

## 2013-10-22 MED ORDER — KETOROLAC TROMETHAMINE 60 MG/2ML IM SOLN
INTRAMUSCULAR | Status: AC
  Filled 2013-10-22: qty 2

## 2013-10-22 MED ORDER — HYDROCODONE-ACETAMINOPHEN 5-325 MG PO TABS: 1.0000 | ORAL_TABLET | ORAL | Status: DC | PRN

## 2013-10-22 MED ORDER — AMOXICILLIN 500 MG PO CAPS: 500.0000 mg | ORAL_CAPSULE | Freq: Three times a day (TID) | ORAL | Status: DC

## 2013-10-22 NOTE — Discharge Instructions (Signed)
Abscessed Tooth An abscessed tooth is an infection around your tooth. It may be caused by holes or damage to the tooth (cavity) or a dental disease. An abscessed tooth causes mild to very bad pain in and around the tooth. See your dentist right away if you have tooth or gum pain. HOME CARE  Take your medicine as told. Finish it even if you start to feel better.  Do not drive after taking pain medicine.  Rinse your mouth (gargle) often with salt water ( teaspoon salt in 8 ounces of warm water).  Do not apply heat to the outside of your face. GET HELP RIGHT AWAY IF:   You have a temperature by mouth above 102 F (38.9 C), not controlled by medicine.  You have chills and a very bad headache.  You have problems breathing or swallowing.  Your mouth will not open.  You develop puffiness (swelling) on the neck or around the eye.  Your pain is not helped by medicine.  Your pain is getting worse instead of better. MAKE SURE YOU:   Understand these instructions.  Will watch your condition.  Will get help right away if you are not doing well or get worse. Document Released: 07/17/2007 Document Revised: 04/22/2011 Document Reviewed: 05/08/2010 [employer] Patient Information 2015 [employer]. This information is not intended to replace advice given to you by your health care provider. Make sure you discuss any questions you have with your health care provider.  

## 2013-10-22 NOTE — ED Provider Notes (Signed)
Medical screening examination/treatment/procedure(s) were performed by non-physician practitioner and as supervising physician I was immediately available for consultation/collaboration.  Srah Ake, M.D.  Trevino Wyatt C Brasen Bundren, MD 10/22/13 1859 

## 2013-10-22 NOTE — ED Notes (Signed)
Patient reports her tooth broke yesterday. She has a hx of gum disease. Patient reports she has a dental appt Thursday 9/17. Patient is alert and oriented and NAD.

## 2013-10-22 NOTE — ED Provider Notes (Signed)
CSN: [id number]     Arrival date & time 10/22/13  1718 History   First MD Initiated Contact with Patient 10/22/13 1728     Chief Complaint  Patient presents with  . Dental Pain   (Consider location/radiation/quality/duration/timing/severity/associated sxs/prior Treatment) HPI Comments: Patient presents today with mouth pain. She is currently in the process of getting an upper plate; but does not have an appt until Thursday. She broke another tooth yesterday, and has pain, swelling and redness in the left upper gum. She was up all night, and Ibuprofen at  has not been effective. Her pain travels up to the left ear as well. She denies fever or chills. No N,V.   Patient is a 33 y.o. female presenting with tooth pain. The history is provided by the patient.  Dental Pain   Past Medical History  Diagnosis Date  . Asthma   . Obesity    Past Surgical History  Procedure Laterality Date  . Toenail excision     Family History  Problem Relation Age of Onset  . Diabetes Mother   . Hypertension Mother    History  Substance Use Topics  . Smoking status: Former Smoker    Types: Cigarettes  . Smokeless tobacco: Not on file  . Alcohol Use: No   OB History   Grav Para Term Preterm Abortions TAB SAB Ect Mult Living                 Review of Systems  All other systems reviewed and are negative.   Allergies  Shellfish allergy and Sulfa antibiotics  Home Medications   Prior to Admission medications   Medication Sig Start Date End Date Taking? Authorizing Provider  albuterol (PROVENTIL HFA;VENTOLIN HFA) 108 (90 BASE) MCG/ACT inhaler Inhale 2 puffs into the lungs every 6 (six) hours as needed. wheezing     Historical Provider, MD  amoxicillin (AMOXIL) 500 MG capsule Take 1 capsule (500 mg total) by mouth 3 (three) times daily. 10/22/13   Riki Sheer, PA-C  HYDROcodone-acetaminophen (NORCO) 5-325 MG per tablet Take 1-2 tablets by mouth every 4 (four) hours as needed for moderate  pain. 10/22/13   Riki Sheer, PA-C  ibuprofen (ADVIL,MOTRIN) 200 MG tablet Take 400 mg by mouth every 6 (six) hours as needed. Pain jaw    Historical Provider, MD   BP 129/88  Pulse 65  Temp(Src) 98.2 F (36.8 C) (Oral)  Resp 14  SpO2 100%  LMP 10/22/2013 Physical Exam  Nursing note and vitals reviewed. Constitutional: She appears well-developed and well-nourished. No distress.  HENT:  Head: Normocephalic and atraumatic.  Left Ear: External ear normal.  Mouth/Throat: Oropharynx is clear and moist.  Left upper gum, with erythema, broken front tooth, no definite exudate, tender to palpation.   Pulmonary/Chest: Effort normal.  Skin: Skin is warm and dry. She is not diaphoretic.  Psychiatric: Her behavior is normal.    ED Course  Procedures (including critical care time) Labs Review Labs Reviewed - No data to display  Imaging Review No results found.   MDM   1. Abscess of upper gum   2. Broken tooth, with routine healing, subsequent encounter   3. Pain due to dental caries    Exam consistent with probable abscess/broken tooth. Preceding dental work confirms work up in process. Cover with abx therapy, and pain medication. Toradol given today in setting of acute pain. F/U with Dentist as scheduled. Worsening fevers or chills return for an evaluation.  Riki Sheer, PA-C 10/22/13 1751

## 2014-01-22 ENCOUNTER — Encounter (HOSPITAL_COMMUNITY): Payer: Self-pay | Admitting: Emergency Medicine

## 2014-01-22 DIAGNOSIS — R109 Unspecified abdominal pain: Secondary | ICD-10-CM | POA: Diagnosis present

## 2014-01-22 DIAGNOSIS — Z87891 Personal history of nicotine dependence: Secondary | ICD-10-CM | POA: Diagnosis not present

## 2014-01-22 DIAGNOSIS — Z79899 Other long term (current) drug therapy: Secondary | ICD-10-CM | POA: Diagnosis not present

## 2014-01-22 DIAGNOSIS — Z792 Long term (current) use of antibiotics: Secondary | ICD-10-CM | POA: Diagnosis not present

## 2014-01-22 DIAGNOSIS — N200 Calculus of kidney: Secondary | ICD-10-CM | POA: Insufficient documentation

## 2014-01-22 DIAGNOSIS — J45909 Unspecified asthma, uncomplicated: Secondary | ICD-10-CM | POA: Diagnosis not present

## 2014-01-22 DIAGNOSIS — I10 Essential (primary) hypertension: Secondary | ICD-10-CM | POA: Insufficient documentation

## 2014-01-22 DIAGNOSIS — E669 Obesity, unspecified: Secondary | ICD-10-CM | POA: Insufficient documentation

## 2014-01-22 DIAGNOSIS — Z3202 Encounter for pregnancy test, result negative: Secondary | ICD-10-CM | POA: Insufficient documentation

## 2014-01-22 LAB — COMPREHENSIVE METABOLIC PANEL
ALBUMIN: 3.2 g/dL — AB (ref 3.5–5.2)
ALK PHOS: 94 U/L (ref 39–117)
ALT: 13 U/L (ref 0–35)
ANION GAP: 15 (ref 5–15)
AST: 15 U/L (ref 0–37)
BUN: 10 mg/dL (ref 6–23)
CHLORIDE: 103 meq/L (ref 96–112)
CO2: 21 mEq/L (ref 19–32)
Calcium: 9.6 mg/dL (ref 8.4–10.5)
Creatinine, Ser: 0.67 mg/dL (ref 0.50–1.10)
GFR calc Af Amer: >90 mL/min (ref >90)
GFR calc non Af Amer: >90 mL/min (ref >90)
Glucose, Bld: 106 mg/dL — ABNORMAL HIGH (ref 70–99)
Potassium: 4.1 mEq/L (ref 3.7–5.3)
Sodium: 139 mEq/L (ref 137–147)
Total Protein: 7.4 g/dL (ref 6.0–8.3)

## 2014-01-22 LAB — CBC WITH DIFFERENTIAL/PLATELET
BASOS PCT: 1 % (ref 0–1)
Basophils Absolute: 0.1 10*3/uL (ref 0.0–0.1)
EOS ABS: 0.3 10*3/uL (ref 0.0–0.7)
Eosinophils Relative: 3 % (ref 0–5)
HCT: 33.2 % — ABNORMAL LOW (ref 36.0–46.0)
HEMOGLOBIN: 9.7 g/dL — AB (ref 12.0–15.0)
LYMPHS PCT: 43 % (ref 12–46)
Lymphs Abs: 4.3 10*3/uL — ABNORMAL HIGH (ref 0.7–4.0)
MCH: 19.1 pg — ABNORMAL LOW (ref 26.0–34.0)
MCHC: 29.2 g/dL — AB (ref 30.0–36.0)
MCV: 65.2 fL — ABNORMAL LOW (ref 78.0–100.0)
Monocytes Absolute: 0.5 10*3/uL (ref 0.1–1.0)
Monocytes Relative: 5 % (ref 3–12)
NEUTROS PCT: 48 % (ref 43–77)
Neutro Abs: 4.9 10*3/uL (ref 1.7–7.7)
Platelets: 280 10*3/uL (ref 150–400)
RBC: 5.09 MIL/uL (ref 3.87–5.11)
RDW: 18.7 % — ABNORMAL HIGH (ref 11.5–15.5)
WBC: 10.1 10*3/uL (ref 4.0–10.5)

## 2014-01-22 LAB — LIPASE, BLOOD: Lipase: 16 U/L (ref 11–59)

## 2014-01-22 MED ORDER — ONDANSETRON 4 MG PO TBDP
8.0000 mg | ORAL_TABLET | Freq: Once | ORAL | Status: AC
  Administered 2014-01-22: 8 mg via ORAL
  Filled 2014-01-22: qty 2

## 2014-01-22 NOTE — ED Notes (Signed)
Right flank pain since Monday.  How having nausea and vomiting since Thursday.  States pain is now in my back and in my left leg.

## 2014-01-23 ENCOUNTER — Emergency Department (HOSPITAL_COMMUNITY)
Admission: EM | Admit: 2014-01-23 | Discharge: 2014-01-23 | Disposition: A | Discharge status: Home / Self Care | Payer: Managed Care, Other (non HMO) | Attending: Emergency Medicine | Admitting: Emergency Medicine

## 2014-01-23 DIAGNOSIS — N200 Calculus of kidney: Secondary | ICD-10-CM

## 2014-01-23 HISTORY — DX: Essential (primary) hypertension: I10

## 2014-01-23 LAB — URINE MICROSCOPIC-ADD ON

## 2014-01-23 LAB — URINALYSIS, ROUTINE W REFLEX MICROSCOPIC
Bilirubin Urine: NEGATIVE
GLUCOSE, UA: NEGATIVE mg/dL
Ketones, ur: 15 mg/dL — AB
Leukocytes, UA: NEGATIVE
Nitrite: NEGATIVE
PH: 5.5 (ref 5.0–8.0)
Protein, ur: NEGATIVE mg/dL
SPECIFIC GRAVITY, URINE: 1.03 (ref 1.005–1.030)
Urobilinogen, UA: 0.2 mg/dL (ref 0.0–1.0)

## 2014-01-23 LAB — PREGNANCY, URINE: Preg Test, Ur: NEGATIVE

## 2014-01-23 MED ORDER — ONDANSETRON HCL 4 MG/2ML IJ SOLN
4.0000 mg | Freq: Once | INTRAMUSCULAR | Status: DC
  Filled 2014-01-23: qty 2

## 2014-01-23 MED ORDER — KETOROLAC TROMETHAMINE 30 MG/ML IJ SOLN
30.0000 mg | Freq: Once | INTRAMUSCULAR | Status: DC
  Filled 2014-01-23: qty 1

## 2014-01-23 MED ORDER — OXYCODONE-ACETAMINOPHEN 5-325 MG PO TABS
2.0000 | ORAL_TABLET | ORAL | Status: DC | PRN
Start: 2014-01-23 — End: 2014-08-25

## 2014-01-23 MED ORDER — KETOROLAC TROMETHAMINE 30 MG/ML IJ SOLN
60.0000 mg | Freq: Once | INTRAMUSCULAR | Status: AC
  Administered 2014-01-23: 60 mg via INTRAMUSCULAR
  Filled 2014-01-23: qty 2

## 2014-01-23 MED ORDER — SODIUM CHLORIDE 0.9 % IV BOLUS (SEPSIS): 1000.0000 mL | Freq: Once | INTRAVENOUS | Status: DC

## 2014-01-23 MED ORDER — HYDROMORPHONE HCL 1 MG/ML IJ SOLN
1.0000 mg | Freq: Once | INTRAMUSCULAR | Status: DC
  Filled 2014-01-23: qty 1

## 2014-01-23 MED ORDER — ONDANSETRON 8 MG PO TBDP
8.0000 mg | ORAL_TABLET | Freq: Three times a day (TID) | ORAL | Status: DC | PRN
Start: 2014-01-23 — End: 2014-08-25

## 2014-01-23 MED ORDER — NAPROXEN 500 MG PO TABS: 500.0000 mg | ORAL_TABLET | Freq: Two times a day (BID) | ORAL | Status: DC

## 2014-01-23 MED ORDER — HYDROMORPHONE HCL 1 MG/ML IJ SOLN
2.0000 mg | Freq: Once | INTRAMUSCULAR | Status: AC
Start: 2014-01-23 — End: 2014-01-23
  Administered 2014-01-23: 2 mg via INTRAMUSCULAR
  Filled 2014-01-23: qty 2

## 2014-01-23 NOTE — ED Provider Notes (Signed)
CSN: [id number]     Arrival date & time 01/22/14  2114 History  This chart was scribed for Olivia Mackielga M Koralyn Prestage, MD by [facility], ED Scribe. The patient was seen in A13C/A13C and the patient's care was started at 1:13 AM.   Chief Complaint  Patient presents with  . Flank Pain  . Nausea   Patient is a 33 y.o. female presenting with flank pain. The history is provided by the patient. No language interpreter was used.  Flank Pain Associated symptoms include abdominal pain.    HPI Comments: Victoria Booth is a 33 y.o. female who presents to the Emergency Department complaining of left sided abdominal pain that radiates to her back and left leg, onset Monday. She rates the pain as 10/10 and states it has gradually worsened since the onset. Pt has nausea and vomiting since Wednesday. Pt also has blood in her urine. She denies fever and diarrhea.  Several months ago pt was having abdominal pain and was seen in the ED and a Urologist and she was told she had kidney stones and she should wait for the stone to pass. Her kidney stone was on the left side.  Pt notes she has irritable bowel syndrome.  Past Medical History  Diagnosis Date  . Asthma   . Obesity   . Hypertension    Past Surgical History  Procedure Laterality Date  . Toenail excision     Family History  Problem Relation Age of Onset  . Diabetes Mother   . Hypertension Mother    History  Substance Use Topics  . Smoking status: Former Smoker    Types: Cigarettes  . Smokeless tobacco: Not on file  . Alcohol Use: No   OB History    No data available     Review of Systems  Constitutional: Negative for fever.  Gastrointestinal: Positive for nausea, vomiting and abdominal pain. Negative for diarrhea.  Genitourinary: Positive for hematuria and flank pain.  Musculoskeletal: Positive for back pain.  All other systems reviewed and are negative.  Allergies  Shellfish allergy and Sulfa antibiotics  Home Medications   Prior  to Admission medications   Medication Sig Start Date End Date Taking? Authorizing Provider  albuterol (PROVENTIL HFA;VENTOLIN HFA) 108 (90 BASE) MCG/ACT inhaler Inhale 2 puffs into the lungs every 6 (six) hours as needed. wheezing     Historical Provider, MD  amoxicillin (AMOXIL) 500 MG capsule Take 1 capsule (500 mg total) by mouth 3 (three) times daily. 10/22/13   Riki SheerMichelle G Young, PA-C  HYDROcodone-acetaminophen (NORCO) 5-325 MG per tablet Take 1-2 tablets by mouth every 4 (four) hours as needed for moderate pain. 10/22/13   Riki SheerMichelle G Young, PA-C  ibuprofen (ADVIL,MOTRIN) 200 MG tablet Take 400 mg by mouth every 6 (six) hours as needed. Pain jaw    Historical Provider, MD   BP 149/82 mmHg  Pulse 67  Temp(Src) 97.8 F (36.6 C) (Oral)  Resp 20  Ht 5\' 6"  (1.676 m)  Wt 233 lb (105.688 kg)  BMI 37.63 kg/m2  SpO2 97%  LMP 01/10/2014 (Approximate) Physical Exam  Constitutional: She is oriented to person, place, and time. She appears well-developed and well-nourished. She appears distressed (uncomfortable appearing).  HENT:  Head: Normocephalic and atraumatic.  Nose: Nose normal.  Mouth/Throat: Oropharynx is clear and moist.  Eyes: Conjunctivae and EOM are normal. Pupils are equal, round, and reactive to light.  Neck: Normal range of motion. Neck supple. No JVD present. No tracheal deviation present.  No thyromegaly present.  Cardiovascular: Normal rate, regular rhythm, normal heart sounds and intact distal pulses.  Exam reveals no gallop and no friction rub.   No murmur heard. Pulmonary/Chest: Effort normal and breath sounds normal. No stridor. No respiratory distress. She has no wheezes. She has no rales. She exhibits no tenderness.  Abdominal: Soft. Bowel sounds are normal. She exhibits no distension and no mass. There is no tenderness. There is no rebound and no guarding.  CVA tenderness on the left  Musculoskeletal: Normal range of motion. She exhibits no edema or tenderness.   Lymphadenopathy:    She has no cervical adenopathy.  Neurological: She is alert and oriented to person, place, and time. She displays normal reflexes. She exhibits normal muscle tone. Coordination normal.  Skin: Skin is warm and dry. No rash noted. No erythema. No pallor.  Psychiatric: She has a normal mood and affect. Her behavior is normal. Judgment and thought content normal.  Nursing note and vitals reviewed.   ED Course  Procedures  DIAGNOSTIC STUDIES: Oxygen Saturation is 97% on room air, normal by my interpretation.    COORDINATION OF CARE: 1:19 AM Discussed treatment plan with pt at bedside and pt agreed to plan Labs Review Labs Reviewed  CBC WITH DIFFERENTIAL - Abnormal; Notable for the following:    Hemoglobin 9.7 (*)    HCT 33.2 (*)    MCV 65.2 (*)    MCH 19.1 (*)    MCHC 29.2 (*)    RDW 18.7 (*)    Lymphs Abs 4.3 (*)    All other components within normal limits  COMPREHENSIVE METABOLIC PANEL - Abnormal; Notable for the following:    Glucose, Bld 106 (*)    Albumin 3.2 (*)    Total Bilirubin <0.2 (*)    All other components within normal limits  URINALYSIS, ROUTINE W REFLEX MICROSCOPIC - Abnormal; Notable for the following:    APPearance CLOUDY (*)    Hgb urine dipstick LARGE (*)    Ketones, ur 15 (*)    All other components within normal limits  URINE MICROSCOPIC-ADD ON - Abnormal; Notable for the following:    Squamous Epithelial / LPF FEW (*)    Crystals CA OXALATE CRYSTALS (*)    All other components within normal limits  LIPASE, BLOOD  PREGNANCY, URINE   Imaging Review No results found.   EKG Interpretation None      MDM   Final diagnoses:  Kidney stone on left side  I personally performed the services described in this documentation, which was scribed in my presence. The recorded information has been reviewed and is accurate.  33 year old female with left flank pain and hematuria.  Patient has history of prior left-sided 5 mm stone.  Several  months ago.  I suspect she has a second stone.  We'll hold off on imaging at this time as she is not in acute distress having fever or other changes.  We'll try to get pain under control and have her follow-up with urology.   Patient reports that she is feeling better, pain is controlled.  No nausea or vomiting.  Will discharge home with pain and nausea medicine to follow-up with primary care doctor and/or urology.      Olivia Mackie, MD 01/24/14 [phone]

## 2014-01-23 NOTE — ED Notes (Signed)
Resting at this time with no distress noted or complaints voiced.

## 2014-01-23 NOTE — ED Notes (Signed)
Attempted IV x2. Unable to access. Monique, RN made aware of the inability and is going to attempt.

## 2014-01-23 NOTE — Discharge Instructions (Signed)
You are suspected to have a left sided kidney stone based on your urine and symptoms.  Drink plenty of fluids.  Take pain and nausea medications as prescribed.  Return to the ER for worsening pain, or nausea despite medications, fever, or other new concerning symptoms.  Follow up with urology as listed above.   If you have problems with kidney stones either with your current stone or with future stones, the urology group prefers that you be [facility] versus [facility].  They are better equipped to handle complications at the [facility].     Kidney Stones Kidney stones (urolithiasis) are deposits that form inside your kidneys. The intense pain is caused by the stone moving through the urinary tract. When the stone moves, the ureter goes into spasm around the stone. The stone is usually passed in the urine.  CAUSES   A disorder that makes certain neck glands produce too much parathyroid hormone (primary hyperparathyroidism).  A buildup of uric acid crystals, similar to gout in your joints.  Narrowing (stricture) of the ureter.  A kidney obstruction present at birth (congenital obstruction).  Previous surgery on the kidney or ureters.  Numerous kidney infections. SYMPTOMS   Feeling sick to your stomach (nauseous).  Throwing up (vomiting).  Blood in the urine (hematuria).  Pain that usually spreads (radiates) to the groin.  Frequency or urgency of urination. DIAGNOSIS   Taking a history and physical exam.  Blood or urine tests.  CT scan.  Occasionally, an examination of the inside of the urinary bladder (cystoscopy) is performed. TREATMENT   Observation.  Increasing your fluid intake.  Extracorporeal shock wave lithotripsy--This is a noninvasive procedure that uses shock waves to break up kidney stones.  Surgery may be needed if you have severe pain or persistent obstruction. There are various surgical procedures. Most of the procedures are  performed with the use of small instruments. Only small incisions are needed to accommodate these instruments, so recovery time is minimized. The size, location, and chemical composition are all important variables that will determine the proper choice of action for you. Talk to your health care provider to better understand your situation so that you will minimize the risk of injury to yourself and your kidney.  HOME CARE INSTRUCTIONS   Drink enough water and fluids to keep your urine clear or pale yellow. This will help you to pass the stone or stone fragments.  Strain all urine through the provided strainer. Keep all particulate matter and stones for your health care provider to see. The stone causing the pain may be as small as a grain of salt. It is very important to use the strainer each and every time you pass your urine. The collection of your stone will allow your health care provider to analyze it and verify that a stone has actually passed. The stone analysis will often identify what you can do to reduce the incidence of recurrences.  Only take over-the-counter or prescription medicines for pain, discomfort, or fever as directed by your health care provider.  Make a follow-up appointment with your health care provider as directed.  Get follow-up X-rays if required. The absence of pain does not always mean that the stone has passed. It may have only stopped moving. If the urine remains completely obstructed, it can cause loss of kidney function or even complete destruction of the kidney. It is your responsibility to make sure X-rays and follow-ups are completed. Ultrasounds of  the kidney can show blockages and the status of the kidney. Ultrasounds are not associated with any radiation and can be performed easily in a matter of minutes. SEEK MEDICAL CARE IF:  You experience pain that is progressive and unresponsive to any pain medicine you have been prescribed. SEEK IMMEDIATE MEDICAL CARE  IF:   Pain cannot be controlled with the prescribed medicine.  You have a fever or shaking chills.  The severity or intensity of pain increases over 18 hours and is not relieved by pain medicine.  You develop a new onset of abdominal pain.  You feel faint or pass out.  You are unable to urinate. MAKE SURE YOU:   Understand these instructions.  Will watch your condition.  Will get help right away if you are not doing well or get worse. Document Released: 01/28/2005 Document Revised: 09/30/2012 Document Reviewed: 07/01/2012 [facility] Patient Information 2015 [place], [place]. This information is not intended to replace advice given to you by your health care provider. Make sure you discuss any questions you have with your health care provider.

## 2014-01-23 NOTE — ED Notes (Signed)
Denny Peon, RN attempted x1. Unable to access.

## 2014-02-07 ENCOUNTER — Encounter (HOSPITAL_COMMUNITY): Payer: Self-pay | Admitting: Emergency Medicine

## 2014-02-07 ENCOUNTER — Emergency Department (INDEPENDENT_AMBULATORY_CARE_PROVIDER_SITE_OTHER)
Admission: EM | Admit: 2014-02-07 | Discharge: 2014-02-07 | Disposition: A | Discharge status: Home / Self Care | Payer: Managed Care, Other (non HMO) | Source: Home / Self Care

## 2014-02-07 DIAGNOSIS — J209 Acute bronchitis, unspecified: Secondary | ICD-10-CM

## 2014-02-07 DIAGNOSIS — J208 Acute bronchitis due to other specified organisms: Secondary | ICD-10-CM

## 2014-02-07 MED ORDER — DEXTROMETHORPHAN POLISTIREX 30 MG/5ML PO LQCR: 60.0000 mg | Freq: Two times a day (BID) | ORAL | Status: DC

## 2014-02-07 MED ORDER — IPRATROPIUM BROMIDE 0.06 % NA SOLN: 2.0000 | Freq: Four times a day (QID) | NASAL | Status: DC

## 2014-02-07 MED ORDER — MINOCYCLINE HCL 100 MG PO CAPS: 100.0000 mg | ORAL_CAPSULE | Freq: Two times a day (BID) | ORAL | Status: DC

## 2014-02-07 NOTE — Discharge Instructions (Signed)
Take all of medicine, drink lots of fluids, no more smoking, see your doctor if further problems  °

## 2014-02-07 NOTE — ED Notes (Signed)
Pt states that she has had a cough and congestion since 02/03/2014

## 2014-02-07 NOTE — ED Provider Notes (Signed)
CSN: [id number]     Arrival date & time 02/07/14  1946 History   None    Chief Complaint  Patient presents with  . Cough   (Consider location/radiation/quality/duration/timing/severity/associated sxs/prior Treatment) Patient is a 33 y.o. female presenting with cough. The history is provided by the patient.  Cough Cough characteristics:  Productive Sputum characteristics:  Yellow Severity:  Moderate Onset quality:  Gradual Duration:  5 days Progression:  Unchanged Chronicity:  New Smoker: yes   Context: sick contacts, smoke exposure and upper respiratory infection   Associated symptoms: rhinorrhea and sinus congestion   Associated symptoms: no chest pain, no ear pain, no fever, no shortness of breath, no sore throat and no wheezing     Past Medical History  Diagnosis Date  . Asthma   . Obesity   . Hypertension    Past Surgical History  Procedure Laterality Date  . Toenail excision     Family History  Problem Relation Age of Onset  . Diabetes Mother   . Hypertension Mother    History  Substance Use Topics  . Smoking status: Former Smoker    Types: Cigarettes  . Smokeless tobacco: Not on file  . Alcohol Use: No   OB History    No data available     Review of Systems  Constitutional: Negative.  Negative for fever.  HENT: Positive for congestion, postnasal drip and rhinorrhea. Negative for ear pain and sore throat.   Respiratory: Positive for cough. Negative for shortness of breath and wheezing.   Cardiovascular: Negative for chest pain.    Allergies  Shellfish allergy and Sulfa antibiotics  Home Medications   Prior to Admission medications   Medication Sig Start Date End Date Taking? Authorizing Provider  spironolactone (ALDACTONE) 50 MG tablet Take 50 mg by mouth daily.   Yes Historical Provider, MD  acetaminophen (TYLENOL) 325 MG tablet Take 650 mg by mouth every 6 (six) hours as needed for mild pain.    Historical Provider, MD  albuterol (PROVENTIL  HFA;VENTOLIN HFA) 108 (90 BASE) MCG/ACT inhaler Inhale 2 puffs into the lungs every 6 (six) hours as needed. wheezing     Historical Provider, MD  dextromethorphan (DELSYM) 30 MG/5ML liquid Take 10 mLs (60 mg total) by mouth 2 (two) times daily. 02/07/14   Linna HoffJames D Loyalty Brashier, MD  ipratropium (ATROVENT) 0.06 % nasal spray Place 2 sprays into both nostrils 4 (four) times daily. 02/07/14   Linna HoffJames D Treanna Dumler, MD  minocycline (MINOCIN,DYNACIN) 100 MG capsule Take 1 capsule (100 mg total) by mouth 2 (two) times daily. 02/07/14   Linna HoffJames D Byran Bilotti, MD  naproxen (NAPROSYN) 500 MG tablet Take 1 tablet (500 mg total) by mouth 2 (two) times daily. 01/23/14   Olivia Mackielga M Otter, MD  ondansetron (ZOFRAN ODT) 8 MG disintegrating tablet Take 1 tablet (8 mg total) by mouth every 8 (eight) hours as needed for nausea or vomiting. 01/23/14   Olivia Mackielga M Otter, MD  oxyCODONE-acetaminophen (PERCOCET/ROXICET) 5-325 MG per tablet Take 2 tablets by mouth every 4 (four) hours as needed for severe pain. 01/23/14   Olivia Mackielga M Otter, MD   BP 142/88 mmHg  Pulse 78  Temp(Src) 97.2 F (36.2 C) (Oral)  Resp 14  SpO2 98%  LMP 02/05/2014 Physical Exam  Constitutional: She is oriented to person, place, and time. She appears well-developed and well-nourished. No distress.  HENT:  Head: Normocephalic.  Right Ear: External ear normal.  Left Ear: External ear normal.  Nose: Nose normal.  Mouth/Throat: Oropharynx  is clear and moist.  Eyes: Conjunctivae are normal. Pupils are equal, round, and reactive to light.  Neck: Normal range of motion. Neck supple.  Cardiovascular: Normal rate, normal heart sounds and intact distal pulses.   Pulmonary/Chest: Effort normal. She has decreased breath sounds. She has rhonchi.  Lymphadenopathy:    She has no cervical adenopathy.  Neurological: She is alert and oriented to person, place, and time.  Skin: Skin is warm and dry.  Nursing note and vitals reviewed.   ED Course  Procedures (including critical care  time) Labs Review Labs Reviewed - No data to display  Imaging Review No results found.   MDM   1. Acute bronchitis due to infection        Linna Hoff, MD 02/07/14 2026

## 2014-02-11 DIAGNOSIS — I10 Essential (primary) hypertension: Secondary | ICD-10-CM | POA: Insufficient documentation

## 2014-07-10 ENCOUNTER — Encounter (HOSPITAL_COMMUNITY): Payer: Self-pay | Admitting: *Deleted

## 2014-07-10 ENCOUNTER — Emergency Department (INDEPENDENT_AMBULATORY_CARE_PROVIDER_SITE_OTHER)
Admission: EM | Admit: 2014-07-10 | Discharge: 2014-07-10 | Disposition: A | Discharge status: Home / Self Care | Payer: Managed Care, Other (non HMO) | Source: Home / Self Care | Attending: Internal Medicine | Admitting: Internal Medicine

## 2014-07-10 DIAGNOSIS — K047 Periapical abscess without sinus: Secondary | ICD-10-CM | POA: Diagnosis not present

## 2014-07-10 MED ORDER — KETOROLAC TROMETHAMINE 60 MG/2ML IM SOLN
60.0000 mg | Freq: Once | INTRAMUSCULAR | Status: AC
  Administered 2014-07-10: 60 mg via INTRAMUSCULAR

## 2014-07-10 MED ORDER — KETOROLAC TROMETHAMINE 60 MG/2ML IM SOLN
INTRAMUSCULAR | Status: AC
  Filled 2014-07-10: qty 2

## 2014-07-10 MED ORDER — AMOXICILLIN-POT CLAVULANATE 875-125 MG PO TABS: 1.0000 | ORAL_TABLET | Freq: Two times a day (BID) | ORAL | Status: DC

## 2014-07-10 NOTE — Discharge Instructions (Signed)
Prescription for augmentin (antibiotic).  Followup with [facility] as planned.    Dental Abscess A dental abscess is a collection of infected fluid (pus) from a bacterial infection in the inner part of the tooth (pulp). It usually occurs at the end of the tooth's root.  CAUSES   Severe tooth decay.  Trauma to the tooth that allows bacteria to enter into the pulp, such as a broken or chipped tooth. SYMPTOMS   Severe pain in and around the infected tooth.  Swelling and redness around the abscessed tooth or in the mouth or face.  Tenderness.  Pus drainage.  Bad breath.  Bitter taste in the mouth.  Difficulty swallowing.  Difficulty opening the mouth.  Nausea.  Vomiting.  Chills.  Swollen neck glands. DIAGNOSIS   A medical and dental history will be taken.  An examination will be performed by tapping on the abscessed tooth.  X-rays may be taken of the tooth to identify the abscess. TREATMENT The goal of treatment is to eliminate the infection. You may be prescribed antibiotic medicine to stop the infection from spreading. A root canal may be performed to save the tooth. If the tooth cannot be saved, it may be pulled (extracted) and the abscess may be drained.  HOME CARE INSTRUCTIONS  Only take over-the-counter or prescription medicines for pain, fever, or discomfort as directed by your caregiver.  Rinse your mouth (gargle) often with salt water ( tsp salt in 8 oz [250 ml] of warm water) to relieve pain or swelling.  Do not drive after taking pain medicine (narcotics).  Do not apply heat to the outside of your face.  Return to your dentist for further treatment as directed. SEEK MEDICAL CARE IF:  Your pain is not helped by medicine.  Your pain is getting worse instead of better. SEEK IMMEDIATE MEDICAL CARE IF:  You have a fever or persistent symptoms for more than 2-3 days.  You have a fever and your symptoms suddenly get worse.  You have chills or a  very bad headache.  You have problems breathing or swallowing.  You have trouble opening your mouth.  You have swelling in the neck or around the eye. Document Released: 01/28/2005 Document Revised: 10/23/2011 Document Reviewed: 05/08/2010 [facility] Patient Information 2015 [place], [place]. This information is not intended to replace advice given to you by your health care provider. Make sure you discuss any questions you have with your health care provider.

## 2014-07-10 NOTE — ED Provider Notes (Signed)
CSN: [id number]     Arrival date & time 07/10/14  1512 History   First MD Initiated Contact with Patient 07/10/14 1556     Chief Complaint  Patient presents with  . Dental Pain   HPI Dental pain for the last week or so, with a history of a lot of dental work. Full dental extractions on the right side, these are pending on the left. Upper left has several carious teeth in the bicuspid area, with a tender lump on the gum above these.  Difficulty chewing, eating. No fever. Mild swelling in the left maxillary area    Past Medical History  Diagnosis Date  . Asthma   . Obesity   . Hypertension    Past Surgical History  Procedure Laterality Date  . Toenail excision     Family History  Problem Relation Age of Onset  . Diabetes Mother   . Hypertension Mother    History  Substance Use Topics  . Smoking status: Former Smoker    Types: Cigarettes  . Smokeless tobacco: Not on file  . Alcohol Use: No   OB History    No data available     Review of Systems  All other systems reviewed and are negative.   Allergies  Shellfish allergy and Sulfa antibiotics  Home Medications   Prior to Admission medications   Medication Sig Start Date End Date Taking? Authorizing Provider  acetaminophen (TYLENOL) 325 MG tablet Take 650 mg by mouth every 6 (six) hours as needed for mild pain.    Historical Provider, MD  albuterol (PROVENTIL HFA;VENTOLIN HFA) 108 (90 BASE) MCG/ACT inhaler Inhale 2 puffs into the lungs every 6 (six) hours as needed. wheezing     Historical Provider, MD                              naproxen (NAPROSYN) 500 MG tablet Take 1 tablet (500 mg total) by mouth 2 (two) times daily. 01/23/14   Marisa Severin, MD                spironolactone (ALDACTONE) 50 MG tablet Take 50 mg by mouth daily.    Historical Provider, MD   BP 158/76 mmHg  Pulse 78  Temp(Src) 98.6 F (37 C) (Oral)  Resp 18  SpO2 98%  LMP 07/05/2014 Physical Exam  Constitutional: She is oriented to person,  place, and time. No distress.  Alert, nicely groomed  HENT:  Head: Atraumatic.  Left upper jaw in the bicuspid area, 3 severely carious teeth, broken off at gumline. The surrounding gum is erythematous, swollen, with a small gum boil evident, draining. Mild trismus  Eyes:  Conjugate gaze, no eye redness/drainage  Neck: Neck supple.  Cardiovascular: Normal rate.   Pulmonary/Chest: No respiratory distress.  Lungs clear, symmetric breath sounds  Abdominal: She exhibits no distension.  Musculoskeletal: Normal range of motion.  No leg swelling  Neurological: She is alert and oriented to person, place, and time.  Skin: Skin is warm and dry.  No cyanosis  Nursing note and vitals reviewed.   ED Course  Procedures (including critical care time) Labs Review Labs Reviewed - No data to display  Imaging Review No results found.   Toradol 60 mg IM given at urgent care for pain.   MDM   1. Dental infection    Prescription for Augmentin. Follow-up with [facility] in [place] as planned.    Eustace Moore, MD  07/10/14 2220 

## 2014-07-10 NOTE — ED Notes (Signed)
Pt  Has    Dental  Caries       She  Reports          A  Bit  Of  The  Tooth   Broke  Off  sev  Days  Ago     Pt  Reports  Scheduled  To  See  A  Dentist       Next   Week     Recently  Had  Dental  Surgery on the  Other  Side

## 2014-08-25 ENCOUNTER — Emergency Department (HOSPITAL_COMMUNITY): Payer: Managed Care, Other (non HMO)

## 2014-08-25 ENCOUNTER — Emergency Department (HOSPITAL_COMMUNITY)
Admission: EM | Admit: 2014-08-25 | Discharge: 2014-08-25 | Disposition: A | Discharge status: Home / Self Care | Payer: Managed Care, Other (non HMO) | Attending: Emergency Medicine | Admitting: Emergency Medicine

## 2014-08-25 ENCOUNTER — Encounter (HOSPITAL_COMMUNITY): Payer: Self-pay

## 2014-08-25 DIAGNOSIS — R109 Unspecified abdominal pain: Secondary | ICD-10-CM | POA: Diagnosis not present

## 2014-08-25 DIAGNOSIS — E669 Obesity, unspecified: Secondary | ICD-10-CM | POA: Insufficient documentation

## 2014-08-25 DIAGNOSIS — R319 Hematuria, unspecified: Secondary | ICD-10-CM | POA: Diagnosis not present

## 2014-08-25 DIAGNOSIS — Z3202 Encounter for pregnancy test, result negative: Secondary | ICD-10-CM | POA: Diagnosis not present

## 2014-08-25 DIAGNOSIS — I10 Essential (primary) hypertension: Secondary | ICD-10-CM | POA: Insufficient documentation

## 2014-08-25 DIAGNOSIS — J45909 Unspecified asthma, uncomplicated: Secondary | ICD-10-CM | POA: Diagnosis not present

## 2014-08-25 DIAGNOSIS — Z79899 Other long term (current) drug therapy: Secondary | ICD-10-CM | POA: Insufficient documentation

## 2014-08-25 DIAGNOSIS — Z87891 Personal history of nicotine dependence: Secondary | ICD-10-CM | POA: Insufficient documentation

## 2014-08-25 DIAGNOSIS — Z87442 Personal history of urinary calculi: Secondary | ICD-10-CM | POA: Diagnosis not present

## 2014-08-25 DIAGNOSIS — Z880 Allergy status to penicillin: Secondary | ICD-10-CM | POA: Insufficient documentation

## 2014-08-25 LAB — URINALYSIS, ROUTINE W REFLEX MICROSCOPIC
Bilirubin Urine: NEGATIVE
GLUCOSE, UA: NEGATIVE mg/dL
KETONES UR: NEGATIVE mg/dL
Leukocytes, UA: NEGATIVE
NITRITE: NEGATIVE
PROTEIN: NEGATIVE mg/dL
Specific Gravity, Urine: 1.034 — ABNORMAL HIGH (ref 1.005–1.030)
Urobilinogen, UA: 0.2 mg/dL (ref 0.0–1.0)
pH: 6 (ref 5.0–8.0)

## 2014-08-25 LAB — URINE MICROSCOPIC-ADD ON

## 2014-08-25 LAB — PREGNANCY, URINE: Preg Test, Ur: NEGATIVE

## 2014-08-25 MED ORDER — IBUPROFEN 600 MG PO TABS
600.0000 mg | ORAL_TABLET | Freq: Three times a day (TID) | ORAL | Status: DC | PRN
Start: 2014-08-25 — End: 2014-08-30

## 2014-08-25 MED ORDER — ONDANSETRON HCL 4 MG/2ML IJ SOLN
4.0000 mg | Freq: Once | INTRAMUSCULAR | Status: AC
  Administered 2014-08-25: 4 mg via INTRAVENOUS
  Filled 2014-08-25: qty 2

## 2014-08-25 MED ORDER — MORPHINE SULFATE 4 MG/ML IJ SOLN: 4.0000 mg | Freq: Once | INTRAMUSCULAR | Status: DC

## 2014-08-25 MED ORDER — OXYCODONE-ACETAMINOPHEN 5-325 MG PO TABS
1.0000 | ORAL_TABLET | Freq: Once | ORAL | Status: AC
  Administered 2014-08-25: 1 via ORAL
  Filled 2014-08-25: qty 1

## 2014-08-25 NOTE — ED Notes (Signed)
Patient reports left flank pain x 5 days.  She also complains of hematuria.  History of kidney stones.

## 2014-08-25 NOTE — ED Provider Notes (Signed)
CSN: [id number]     Arrival date & time 08/25/14  1841 History   First MD Initiated Contact with Patient 08/25/14 2102     Chief Complaint  Patient presents with  . Flank Pain      HPI Patient reports intermittent left flank pain over the past 4-5 days with some radiation towards her left lower abdomen.  She denies melena hematochezia.  No fevers or chills.  No dysuria or urinary frequency.  She states she has a history of kidney stones and this feels similar.  Her pain is mild to moderate in severity at this time.  She reports she noticed small amount of blood in her urine today   Past Medical History  Diagnosis Date  . Asthma   . Obesity   . Hypertension    Past Surgical History  Procedure Laterality Date  . Toenail excision     Family History  Problem Relation Age of Onset  . Diabetes Mother   . Hypertension Mother    History  Substance Use Topics  . Smoking status: Former Smoker    Types: Cigarettes  . Smokeless tobacco: Not on file  . Alcohol Use: No   OB History    No data available     Review of Systems  All other systems reviewed and are negative.     Allergies  Penicillins; Shellfish allergy; Sulfa antibiotics; and Iodine  Home Medications   Prior to Admission medications   Medication Sig Start Date End Date Taking? Authorizing Provider  acetaminophen (TYLENOL) 325 MG tablet Take 650 mg by mouth every 6 (six) hours as needed for mild pain.   Yes Historical Provider, MD  albuterol (PROVENTIL HFA;VENTOLIN HFA) 108 (90 BASE) MCG/ACT inhaler Inhale 2 puffs into the lungs every 6 (six) hours as needed for wheezing or shortness of breath.    Yes Historical Provider, MD  amoxicillin-clavulanate (AUGMENTIN) 875-125 MG per tablet Take 1 tablet by mouth every 12 (twelve) hours. Patient not taking: Reported on 08/25/2014 07/10/14   Eustace Moore, MD  dextromethorphan (DELSYM) 30 MG/5ML liquid Take 10 mLs (60 mg total) by mouth 2 (two) times daily. Patient not  taking: Reported on 08/25/2014 02/07/14   Linna Hoff, MD  ipratropium (ATROVENT) 0.06 % nasal spray Place 2 sprays into both nostrils 4 (four) times daily. Patient not taking: Reported on 08/25/2014 02/07/14   Linna Hoff, MD   BP 129/81 mmHg  Pulse 70  Temp(Src) 97.8 F (36.6 C) (Oral)  Resp 18  SpO2 98%  LMP 08/12/2014 (Approximate) Physical Exam  Constitutional: She is oriented to person, place, and time. She appears well-developed and well-nourished. No distress.  HENT:  Head: Normocephalic and atraumatic.  Eyes: EOM are normal.  Neck: Normal range of motion.  Cardiovascular: Normal rate, regular rhythm and normal heart sounds.   Pulmonary/Chest: Effort normal and breath sounds normal.  Abdominal: Soft. She exhibits no distension. There is no tenderness.  Musculoskeletal: Normal range of motion.  Neurological: She is alert and oriented to person, place, and time.  Skin: Skin is warm and dry.  Psychiatric: She has a normal mood and affect. Judgment normal.  Nursing note and vitals reviewed.   ED Course  Procedures (including critical care time) Labs Review Labs Reviewed  URINALYSIS, ROUTINE W REFLEX MICROSCOPIC (NOT AT [facility]) - Abnormal; Notable for the following:    APPearance CLOUDY (*)    Specific Gravity, Urine 1.034 (*)    Hgb urine dipstick LARGE (*)  All other components within normal limits  URINE MICROSCOPIC-ADD ON  PREGNANCY, URINE    Imaging Review US Renal  08/25/2014   CLINICAL DATA:  Left flank pain.  History of renal stones.  EXAM: RENAL / URINARY TRACT ULTRASOUND COMPLETE  COMPARISON:  CT abdomen and pelvis 08/07/2013  FINDINGS: Right Kidney:  Length: 10.9 cm. Echogenicity within normal limits. No mass or hydronephrosis visualized.  Left Kidney:  Length: 11.2 cm. Echogenicity within normal limits. No mass or hydronephrosis visualized.  Bladder:  No bladder wall thickening or filling defects. Bilateral urine flow jets are demonstrated with color flow  Doppler imaging.  IMPRESSION: Normal sonographic appearance of the kidneys and bladder.   Electronically Signed   By: Burman Nieves M.D.   On: 08/25/2014 23:04  I personally reviewed the imaging tests through PACS system I reviewed available ER/hospitalization records through the EMR    EKG Interpretation None      MDM   Final diagnoses:  Left flank pain    Patient is feeling better at this time.  Hematuria noted however renal ultrasound demonstrates no left-sided hydronephrosis.  Doubt diverticulitis.  Discharge home in good condition.  Home with anti-inflammatories.  Patient understands to return to the ER for new or worsening symptoms    Azalia Bilis, MD 08/25/14 [phone]

## 2014-08-30 ENCOUNTER — Emergency Department (INDEPENDENT_AMBULATORY_CARE_PROVIDER_SITE_OTHER): Payer: Managed Care, Other (non HMO)

## 2014-08-30 ENCOUNTER — Emergency Department (INDEPENDENT_AMBULATORY_CARE_PROVIDER_SITE_OTHER)
Admission: EM | Admit: 2014-08-30 | Discharge: 2014-08-30 | Disposition: A | Discharge status: Home / Self Care | Payer: Self-pay | Source: Home / Self Care | Attending: Emergency Medicine | Admitting: Emergency Medicine

## 2014-08-30 ENCOUNTER — Encounter (HOSPITAL_COMMUNITY): Payer: Self-pay | Admitting: Emergency Medicine

## 2014-08-30 DIAGNOSIS — S4991XA Unspecified injury of right shoulder and upper arm, initial encounter: Secondary | ICD-10-CM

## 2014-08-30 MED ORDER — IBUPROFEN 800 MG PO TABS: 800.0000 mg | ORAL_TABLET | Freq: Three times a day (TID) | ORAL | Status: DC | PRN

## 2014-08-30 MED ORDER — METHYLPREDNISOLONE ACETATE 40 MG/ML IJ SUSP
INTRAMUSCULAR | Status: AC
  Filled 2014-08-30: qty 1

## 2014-08-30 NOTE — Discharge Instructions (Signed)
There is no fracture or dislocation of your shoulder. You likely jammed the joint and irritated the muscles and tendons of the shoulder. We did a cortisone injection today. Please ice your shoulder several times over the next 24 hours. The steroid takes 1-2 days to take effect. Take ibuprofen 800 mg every 8 hours as needed for pain. Follow-up with your primary care doctor if no improvement in 1 week.

## 2014-08-30 NOTE — ED Provider Notes (Signed)
CSN: [id number]     Arrival date & time 08/30/14  1419 History   First MD Initiated Contact with Patient 08/30/14 1451     Chief Complaint  Patient presents with  . Shoulder Pain   (Consider location/radiation/quality/duration/timing/severity/associated sxs/prior Treatment) HPI  She is a 34 year old woman here for evaluation of right shoulder pain. She states she was at the beach on Saturday and fell going down some wet stairs. She landed hard on her right shoulder. She tried ice, heat, massage, gentle range of motion without any improvement. She states the pain is located in her armpit and the back of her shoulder. She is unable to abduct her arm beyond 15-20 due to pain.  Denies numbness, tingling, weakness in her hand.  Past Medical History  Diagnosis Date  . Asthma   . Obesity   . Hypertension    Past Surgical History  Procedure Laterality Date  . Toenail excision     Family History  Problem Relation Age of Onset  . Diabetes Mother   . Hypertension Mother    History  Substance Use Topics  . Smoking status: Former Smoker    Types: Cigarettes  . Smokeless tobacco: Not on file  . Alcohol Use: No   OB History    No data available     Review of Systems As in history of present illness Allergies  Penicillins; Shellfish allergy; Sulfa antibiotics; and Iodine  Home Medications   Prior to Admission medications   Medication Sig Start Date End Date Taking? Authorizing Provider  acetaminophen (TYLENOL) 325 MG tablet Take 650 mg by mouth every 6 (six) hours as needed for mild pain.    Historical Provider, MD  albuterol (PROVENTIL HFA;VENTOLIN HFA) 108 (90 BASE) MCG/ACT inhaler Inhale 2 puffs into the lungs every 6 (six) hours as needed for wheezing or shortness of breath.     Historical Provider, MD  ibuprofen (ADVIL,MOTRIN) 800 MG tablet Take 1 tablet (800 mg total) by mouth every 8 (eight) hours as needed for moderate pain. 08/30/14   Charm Rings, MD   BP 120/82 mmHg   Pulse 74  Temp(Src) 97.7 F (36.5 C) (Oral)  Resp 18  SpO2 98%  LMP 08/13/2014 Physical Exam  Constitutional: She is oriented to person, place, and time. She appears well-developed and well-nourished.  Cardiovascular: Normal rate.   Pulmonary/Chest: Effort normal.  Musculoskeletal:  Right shoulder: No erythema or bruising seen. She does have a slight step-off compared to the left shoulder. She is tender over the spine of the scapula and the posterior shoulder. Passive range of motion is limited to 20 of abduction due to pain. 2+ radial pulse. Sensation grossly intact. Full range of motion and strength in elbow and hand.  Neurological: She is alert and oriented to person, place, and time.    ED Course  Injection of joint Date/Time: 08/30/2014 3:46 PM Performed by: Charm Rings Authorized by: Charm Rings Consent: Verbal consent obtained. Risks and benefits: risks, benefits and alternatives were discussed Consent given by: patient Patient understanding: patient states understanding of the procedure being performed Patient identity confirmed: verbally with patient Time out: Immediately prior to procedure a "time out" was called to verify the correct patient, procedure, equipment, support staff and site/side marked as required. Comments: Skin was cleaned with alcohol. Cold spray used for local and aesthetic. 40 mg Depo-Medrol with 3 mL of 2% lidocaine was injected into the right shoulder. Patient tolerated procedure well without immediate complications.   (including  critical care time) Labs Review Labs Reviewed - No data to display  Imaging Review Dg Shoulder Right  08/30/2014   CLINICAL DATA:  Pain following fall down stairs  EXAM: RIGHT SHOULDER - 2+ VIEW  COMPARISON:  November 24, 2005  FINDINGS: Frontal, oblique, Y scapular, and axillary images obtained. There is no fracture or dislocation. Joint spaces appear intact. No erosive change or intra-articular calcification.  IMPRESSION:  No fracture or dislocation.  No appreciable arthropathic change.   Electronically Signed   By: Bretta Bang III M.D.   On: 08/30/2014 15:28     MDM   1. Right shoulder injury, initial encounter    Cortisone injection done. Recommended ice and ibuprofen. Follow-up as needed.     Charm Rings, MD 08/30/14 [phone]

## 2014-08-30 NOTE — ED Notes (Signed)
C/o right shoulder pain due to falling on it States on Saturday while at the beach patient fell while walking down steps to go to [employer] she is unable to lift arm Heat and ibuprofen was used as tx

## 2014-09-19 ENCOUNTER — Other Ambulatory Visit: Payer: Managed Care, Other (non HMO) | Admitting: Internal Medicine

## 2014-09-20 ENCOUNTER — Encounter: Payer: Self-pay | Admitting: Internal Medicine

## 2014-09-22 ENCOUNTER — Encounter: Payer: Self-pay | Admitting: Internal Medicine

## 2014-12-01 ENCOUNTER — Encounter: Payer: Self-pay | Admitting: Internal Medicine

## 2014-12-01 ENCOUNTER — Ambulatory Visit (HOSPITAL_COMMUNITY)
Admission: RE | Admit: 2014-12-01 | Discharge: 2014-12-01 | Disposition: A | Discharge status: Home / Self Care | Payer: Managed Care, Other (non HMO) | Source: Ambulatory Visit | Attending: Internal Medicine | Admitting: Internal Medicine

## 2014-12-01 ENCOUNTER — Ambulatory Visit (INDEPENDENT_AMBULATORY_CARE_PROVIDER_SITE_OTHER): Payer: Managed Care, Other (non HMO) | Admitting: Internal Medicine

## 2014-12-01 VITALS — BP 144/69 | HR 68 | Temp 98.4°F | Ht 66.0 in | Wt 293.1 lb

## 2014-12-01 DIAGNOSIS — K219 Gastro-esophageal reflux disease without esophagitis: Secondary | ICD-10-CM | POA: Insufficient documentation

## 2014-12-01 DIAGNOSIS — J45909 Unspecified asthma, uncomplicated: Secondary | ICD-10-CM | POA: Insufficient documentation

## 2014-12-01 DIAGNOSIS — N926 Irregular menstruation, unspecified: Secondary | ICD-10-CM

## 2014-12-01 DIAGNOSIS — R9431 Abnormal electrocardiogram [ECG] [EKG]: Secondary | ICD-10-CM | POA: Diagnosis not present

## 2014-12-01 DIAGNOSIS — R002 Palpitations: Secondary | ICD-10-CM | POA: Diagnosis not present

## 2014-12-01 DIAGNOSIS — J4520 Mild intermittent asthma, uncomplicated: Secondary | ICD-10-CM

## 2014-12-01 DIAGNOSIS — K5900 Constipation, unspecified: Secondary | ICD-10-CM | POA: Diagnosis not present

## 2014-12-01 DIAGNOSIS — D509 Iron deficiency anemia, unspecified: Secondary | ICD-10-CM | POA: Insufficient documentation

## 2014-12-01 DIAGNOSIS — Z8742 Personal history of other diseases of the female genital tract: Secondary | ICD-10-CM

## 2014-12-01 DIAGNOSIS — N938 Other specified abnormal uterine and vaginal bleeding: Secondary | ICD-10-CM | POA: Insufficient documentation

## 2014-12-01 DIAGNOSIS — I10 Essential (primary) hypertension: Secondary | ICD-10-CM | POA: Diagnosis not present

## 2014-12-01 DIAGNOSIS — E669 Obesity, unspecified: Secondary | ICD-10-CM

## 2014-12-01 DIAGNOSIS — Z0000 Encounter for general adult medical examination without abnormal findings: Secondary | ICD-10-CM | POA: Insufficient documentation

## 2014-12-01 HISTORY — DX: Iron deficiency anemia, unspecified: D50.9

## 2014-12-01 MED ORDER — LISINOPRIL 5 MG PO TABS: 5.0000 mg | ORAL_TABLET | Freq: Every day | ORAL | Status: DC

## 2014-12-01 NOTE — Assessment & Plan Note (Signed)
BP Readings from Last 3 Encounters:  12/01/14 144/69  08/30/14 120/82  08/25/14 129/81    Lab Results  Component Value Date   NA 139 01/22/2014   K 4.1 01/22/2014   CREATININE 0.67 01/22/2014    Assessment: Blood pressure control:  BP mildly elevated here in office and patient reports BP elevated in 160s-170s systolic at home.    Plan: Medications:  Start Lisinopril 5 mg daily.  Other plans:  - Follow up in 2 weeks to recheck BP - Patient possibly may have history of PCOS and has been on spironolactone previously. Can consider adding back spironolactone once diagnosis is confirmed.

## 2014-12-01 NOTE — Assessment & Plan Note (Addendum)
Patient with hx of irregular menstrual periods and has been told she has PCOS. Previously on spironolactone. She does report hirsutism with facial hair that she has to wax off. No hx of acne. Her testosterone was in the normal upper range for women at 41 in 2011. Will recheck hormone levels and try to determine if she truly has PCOS. - Check testosterone, TSH, and prolactin levels - Discuss getting [place] of ovaries at next visit

## 2014-12-01 NOTE — Progress Notes (Signed)
   Subjective:    Patient ID: Victoria Booth, female    DOB: [birth year], 34 y.o.   MRN: [id number]  HPI Victoria Booth is a 33yo woman with PMHx of HTN, asthma, and GERD who presents today as a new patient to establish care.  HTN: BP mildly elevated at 144/69. She reports she checks her blood pressure at home and it usually runs in the 160s-170s systolic. She is currently not on any medications, but reports she has been on spironolactone in the past.   Asthma: She reports her asthma is currently well controlled. She notes 1 hospitalization last year for an asthma exacerbation. She reports her asthma worsens in the winter time with symptoms occurring usually 2-3 times per week and sometimes at night. She uses an albuterol inhaler.   GERD: She reports using Protonix in the past but is currently not taking it. She does report some nausea, but denies other reflux symptoms including heartburn, dysphagia, dry cough, or regurgitation of food.   Microcytic Anemia: Per labs from Dec 2015 patient had a Hgb of 9.7 with MCV 65.2. She reports using iron supplementation previously, but is currently not taking it. No anemia panel in system. She denies heavy menstrual periods. She denies melena and hematochezia. She reports some fatigue, but denies chest pain, shortness of breath, and dizziness.   Constipation: She reports constipation for the last month. She admits she is lacking in fruits and vegetables in her diet. She has not tried any OTC laxatives.   Irregular Menstrual Periods: Patient notes her menstrual periods have been irregular since menarche. She had late menarche at age 16. She reports getting her menstrual cycle about once every 3-4 months. Her cycle lasts typically 4-5 days and is never heavy. She reports she was told she had PCOS in the past. She denies history of acne, but does note having facial hair that she waxes off.   Palpitations: She reports having palpitations over the last few weeks. In the  last week she has experienced 2 episodes of her "heart beating really fast and then a slow throb." The episode typically only lasts a few minutes. The palpitations are accompanied by sharp pains in the middle of her chest. She states the palpitations occur while she is sitting down. She has never experienced this before.    Review of Systems General: Reports weight loss- decreased salt intake. Denies fever, chills, night sweats, changes in appetite HEENT: Reports headaches- occasional. Denies ear pain, changes in vision, rhinorrhea, sore throat CV: Reports chest pains, palpitations. Denies SOB, orthopnea Pulm: Denies SOB, cough, wheezing GI: Reports nausea, constipation. Denies abdominal pain, vomiting, diarrhea, melena, hematochezia GU: Denies dysuria, hematuria, frequency Msk: Denies muscle cramps, joint pains Neuro: Denies weakness, numbness, tingling Skin: Denies rashes, bruising Psych: Denies depression, anxiety, hallucinations    Objective:   Physical Exam General: alert, sitting up, pleasant, NAD HEENT: [place]/AT, EOMI, sclera anicteric, mucus membranes moist Neck: supple, no lymphadenopathy, no thyromegaly CV: RRR, no m/g/r Pulm: CTA bilaterally, breaths non-labored Abd: BS+, soft, obese, non-tender Ext: warm, no peripheral edema  Neuro: alert and oriented x 3, strength 5/5 in upper and lower extremities bilaterally     Assessment & Plan:  Please refer to A&P documentation.

## 2014-12-01 NOTE — Assessment & Plan Note (Addendum)
Palpitations are concerning for a possible arrhythmia given her associated chest pain. An EKG was checked in the office and shows a normal sinus rhythm. She does have early repolarization present in leads I and aVL which is seen on prior EKGs and inverted T waves in leads III and aVF which is new. I do not think she is having acute ischemia at this time as she is asymptomatic. Other possible causes for her palpitations include hyperthyroidism, anemia, caffeine intake, etc. - Will check TSH, CBC - Assess caffeine intake at next visit if still having palpitations

## 2014-12-01 NOTE — Assessment & Plan Note (Signed)
Symptoms controlled- mild intermittent. Per history, her symptoms worsen in winter time and she may benefit from an inhaled glucocorticoid if symptoms worsen.  - Continue Albuterol PRN

## 2014-12-01 NOTE — Patient Instructions (Signed)
-   Start Lisinopril 5 mg daily for your blood pressure - Increase your vegetable and fruit intake to help with constipation - Try to eat a low sodium diet- avoid fast food, fried foods, canned soup - Blood work today  General Instructions:   Please bring your medicines with you each time you come to clinic.  Medicines may include prescription medications, over-the-counter medications, herbal remedies, eye drops, vitamins, or other pills.   Progress Toward Treatment Goals:  No flowsheet data found.  Self Care Goals & Plans:  No flowsheet data found.  No flowsheet data found.   Care Management & Community Referrals:  No flowsheet data found.

## 2014-12-01 NOTE — Assessment & Plan Note (Signed)
Constipation seems to be new. Could be related to low fiber diet. Patient encouraged to incorporate more fruits and vegetables into her diet. Since she does note fatigue as well will check a TSH to rule out hypothyroidism as cause. - Check TSH

## 2014-12-01 NOTE — Assessment & Plan Note (Signed)
Since symptoms appear controlled while off of medication will hold off on restarting Protonix. - Can consider restarting if symptoms worsen

## 2014-12-01 NOTE — Assessment & Plan Note (Signed)
-   Check HIV antibody - Will need pap smear at next visit

## 2014-12-01 NOTE — Assessment & Plan Note (Signed)
Patient's MCV is very low at 65. Will check anemia panel and if not explained by iron deficiency then would consider hemoglobin electrophoresis for possible sickle cell trait. If she is iron deficient then she will need a colonoscopy as she does not report heavy menstrual periods.

## 2014-12-02 LAB — CMP14 + ANION GAP
ALT: 11 IU/L (ref 0–32)
ANION GAP: 16 mmol/L (ref 10.0–18.0)
AST: 9 IU/L (ref 0–40)
Albumin/Globulin Ratio: 1.3 (ref 1.1–2.5)
Albumin: 3.9 g/dL (ref 3.5–5.5)
Alkaline Phosphatase: 100 IU/L (ref 39–117)
BUN / CREAT RATIO: 13 (ref 8–20)
BUN: 8 mg/dL (ref 6–20)
Bilirubin Total: 0.2 mg/dL (ref 0.0–1.2)
CO2: 22 mmol/L (ref 18–29)
CREATININE: 0.6 mg/dL (ref 0.57–1.00)
Calcium: 9.4 mg/dL (ref 8.7–10.2)
Chloride: 103 mmol/L (ref 97–106)
GFR calc non Af Amer: 121 mL/min/{1.73_m2} (ref >59)
GFR, EST AFRICAN AMERICAN: 139 mL/min/{1.73_m2} (ref >59)
Globulin, Total: 2.9 g/dL (ref 1.5–4.5)
Glucose: 96 mg/dL (ref 65–99)
Potassium: 4.3 mmol/L (ref 3.5–5.2)
Sodium: 141 mmol/L (ref 136–144)
TOTAL PROTEIN: 6.8 g/dL (ref 6.0–8.5)

## 2014-12-02 LAB — LIPID PANEL
CHOL/HDL RATIO: 3.8 ratio (ref 0.0–4.4)
CHOLESTEROL TOTAL: 140 mg/dL (ref 100–199)
HDL: 37 mg/dL — AB (ref >39)
LDL CALC: 91 mg/dL (ref 0–99)
TRIGLYCERIDES: 60 mg/dL (ref 0–149)
VLDL Cholesterol Cal: 12 mg/dL (ref 5–40)

## 2014-12-02 LAB — HIV ANTIBODY (ROUTINE TESTING W REFLEX): HIV Screen 4th Generation wRfx: NONREACTIVE

## 2014-12-02 LAB — PROLACTIN: Prolactin: 6.9 ng/mL (ref 4.8–23.3)

## 2014-12-02 LAB — TSH: TSH: 1.35 u[IU]/mL (ref 0.450–4.500)

## 2014-12-02 LAB — TESTOSTERONE: Testosterone: 13 ng/dL (ref 8–48)

## 2014-12-02 NOTE — Progress Notes (Signed)
Internal Medicine Clinic Attending  Case discussed with Dr. Rivet soon after the resident saw the patient.  We reviewed the resident's history and exam and pertinent patient test results.  I agree with the assessment, diagnosis, and plan of care documented in the resident's note.  

## 2014-12-03 LAB — ANEMIA PROFILE B
Basophils Absolute: 0 10*3/uL (ref 0.0–0.2)
Basos: 0 %
EOS (ABSOLUTE): 0.1 10*3/uL (ref 0.0–0.4)
Eos: 2 %
FERRITIN: 73 ng/mL (ref 15–150)
Folate: 5.8 ng/mL (ref >3.0)
HEMOGLOBIN: 9.9 g/dL — AB (ref 11.1–15.9)
Hematocrit: 33.7 % — ABNORMAL LOW (ref 34.0–46.6)
IRON: 14 ug/dL — AB (ref 27–159)
Immature Grans (Abs): 0 10*3/uL (ref 0.0–0.1)
Immature Granulocytes: 0 %
Iron Saturation: 5 % — CL (ref 15–55)
Lymphocytes Absolute: 3.4 10*3/uL — ABNORMAL HIGH (ref 0.7–3.1)
Lymphs: 43 %
MCH: 19.1 pg — AB (ref 26.6–33.0)
MCHC: 29.4 g/dL — AB (ref 31.5–35.7)
MCV: 65 fL — AB (ref 79–97)
MONOS ABS: 0.5 10*3/uL (ref 0.1–0.9)
Monocytes: 7 %
NEUTROS ABS: 3.7 10*3/uL (ref 1.4–7.0)
NEUTROS PCT: 48 %
Platelets: 341 10*3/uL (ref 150–379)
RBC: 5.17 x10E6/uL (ref 3.77–5.28)
RDW: 18.3 % — ABNORMAL HIGH (ref 12.3–15.4)
Retic Ct Pct: 1.2 % (ref 0.6–2.6)
Total Iron Binding Capacity: 266 ug/dL (ref 250–450)
UIBC: 252 ug/dL (ref 131–425)
Vitamin B-12: 296 pg/mL (ref 211–946)
WBC: 7.8 10*3/uL (ref 3.4–10.8)

## 2014-12-09 ENCOUNTER — Emergency Department (INDEPENDENT_AMBULATORY_CARE_PROVIDER_SITE_OTHER)
Admission: EM | Admit: 2014-12-09 | Discharge: 2014-12-09 | Disposition: A | Discharge status: Home / Self Care | Payer: Managed Care, Other (non HMO) | Source: Home / Self Care | Attending: Family Medicine | Admitting: Family Medicine

## 2014-12-09 ENCOUNTER — Encounter (HOSPITAL_COMMUNITY): Payer: Self-pay | Admitting: Emergency Medicine

## 2014-12-09 DIAGNOSIS — F419 Anxiety disorder, unspecified: Secondary | ICD-10-CM | POA: Diagnosis not present

## 2014-12-09 DIAGNOSIS — M940 Chondrocostal junction syndrome [Tietze]: Secondary | ICD-10-CM

## 2014-12-09 DIAGNOSIS — T50905A Adverse effect of unspecified drugs, medicaments and biological substances, initial encounter: Secondary | ICD-10-CM

## 2014-12-09 NOTE — ED Notes (Signed)
C/o constant HA onset Tuesday associated w/rapid palpitation Reports PCP changed her BP pressure med to Lisinopril 5 mg Also has been put on Lorazapem and Sertraline due to pt's mother passing away A&O x4... No acute distress.

## 2014-12-09 NOTE — Discharge Instructions (Signed)
Costochondritis Costochondritis, sometimes called Tietze syndrome, is a swelling and irritation (inflammation) of the tissue (cartilage) that connects your ribs with your breastbone (sternum). It causes pain in the chest and rib area. Costochondritis usually goes away on its own over time. It can take up to 6 weeks or longer to get better, especially if you are unable to limit your activities. CAUSES  Some cases of costochondritis have no known cause. Possible causes include:  Injury (trauma).  Exercise or activity such as lifting.  Severe coughing. SIGNS AND SYMPTOMS  Pain and tenderness in the chest and rib area.  Pain that gets worse when coughing or taking deep breaths.  Pain that gets worse with specific movements. DIAGNOSIS  Your health care provider will do a physical exam and ask about your symptoms. Chest X-rays or other tests may be done to rule out other problems. TREATMENT  Costochondritis usually goes away on its own over time. Your health care provider may prescribe medicine to help relieve pain. HOME CARE INSTRUCTIONS   Avoid exhausting physical activity. Try not to strain your ribs during normal activity. This would include any activities using chest, abdominal, and side muscles, especially if heavy weights are used.  Apply ice to the affected area for the first 2 days after the pain begins.  Put ice in a plastic bag.  Place a towel between your skin and the bag.  Leave the ice on for 20 minutes, 2-3 times a day.  Only take over-the-counter or prescription medicines as directed by your health care provider. SEEK MEDICAL CARE IF:  You have redness or swelling at the rib joints. These are signs of infection.  Your pain does not go away despite rest or medicine. SEEK IMMEDIATE MEDICAL CARE IF:   Your pain increases or you are very uncomfortable.  You have shortness of breath or difficulty breathing.  You cough up blood.  You have worse chest pains,  sweating, or vomiting.  You have a fever or persistent symptoms for more than 2-3 days.  You have a fever and your symptoms suddenly get worse. MAKE SURE YOU:   Understand these instructions.  Will watch your condition.  Will get help right away if you are not doing well or get worse.   This information is not intended to replace advice given to you by your health care provider. Make sure you discuss any questions you have with your health care provider.   Document Released: 11/07/2004 Document Revised: 11/18/2012 Document Reviewed: 09/01/2012 Elsevier Interactive Patient Education 2016 [employer]  Generalized Anxiety Disorder Generalized anxiety disorder (GAD) is a mental disorder. It interferes with life functions, including relationships, work, and school. GAD is different from normal anxiety, which everyone experiences at some point in their lives in response to specific life events and activities. Normal anxiety actually helps [place] prepare for and get through these life events and activities. Normal anxiety goes away after the event or activity is over.  GAD causes anxiety that is not necessarily related to specific events or activities. It also causes excess anxiety in proportion to specific events or activities. The anxiety associated with GAD is also difficult to control. GAD can vary from mild to severe. People with severe GAD can have intense waves of anxiety with physical symptoms (panic attacks).  SYMPTOMS The anxiety and worry associated with GAD are difficult to control. This anxiety and worry are related to many life events and activities and also occur more days than not for 6 months  or longer. People with GAD also have three or more of the following symptoms (one or more in children):  Restlessness.   Fatigue.  Difficulty concentrating.   Irritability.  Muscle tension.  Difficulty sleeping or unsatisfying sleep. DIAGNOSIS GAD is diagnosed through an  assessment by your health care provider. Your health care provider will ask you questions aboutyour mood,physical symptoms, and events in your life. Your health care provider may ask you about your medical history and use of alcohol or drugs, including prescription medicines. Your health care provider may also do a physical exam and blood tests. Certain medical conditions and the use of certain substances can cause symptoms similar to those associated with GAD. Your health care provider may refer you to a mental health specialist for further evaluation. TREATMENT The following therapies are usually used to treat GAD:   Medication. Antidepressant medication usually is prescribed for long-term daily control. Antianxiety medicines may be added in severe cases, especially when panic attacks occur.   Talk therapy (psychotherapy). Certain types of talk therapy can be helpful in treating GAD by providing support, education, and guidance. A form of talk therapy called cognitive behavioral therapy can teach you healthy ways to think about and react to daily life events and activities.  Stress managementtechniques. These include yoga, meditation, and exercise and can be very helpful when they are practiced regularly. A mental health specialist can help determine which treatment is best for you. Some people see improvement with one therapy. However, other people require a combination of therapies.   This information is not intended to replace advice given to you by your health care provider. Make sure you discuss any questions you have with your health care provider.   Document Released: 05/25/2012 Document Revised: 02/18/2014 Document Reviewed: 05/25/2012 Elsevier Interactive Patient Education [employer]

## 2014-12-09 NOTE — ED Provider Notes (Signed)
CSN: [id number]     Arrival date & time 12/09/14  1433 History   First MD Initiated Contact with Patient 12/09/14 1505     Chief Complaint  Patient presents with  . URI   (Consider location/radiation/quality/duration/timing/severity/associated sxs/prior Treatment) HPI Comments: 34 year old female with a history of hypertension presents today with complaint of mild intermittent headache for 3 days, a pressure sensation to the left of the sternum for 2 days. Approximately 2 weeks ago she was started on sertraline and lorazepam due to anxiety associated with her mother's death. She has no known cardiac disease. She denies actual palpitations or sensation of fast heart rate. Denies shortness of breath. The discomfort to the left mid sternum is not exacerbated by taking a deep breath or movement.   Past Medical History  Diagnosis Date  . Asthma   . Obesity   . Hypertension    Past Surgical History  Procedure Laterality Date  . Toenail excision     Family History  Problem Relation Age of Onset  . Diabetes Mother   . Hypertension Mother    Social History  Substance Use Topics  . Smoking status: Light Tobacco Smoker -- 0.10 packs/day    Types: Cigarettes  . Smokeless tobacco: None     Comment: 1 cig / month.  . Alcohol Use: No   OB History    No data available     Review of Systems  Constitutional: Negative for fever, activity change and fatigue.  Respiratory: Negative.   Cardiovascular: Positive for chest pain. Negative for palpitations and leg swelling.  Gastrointestinal: Negative.   Musculoskeletal: Negative.   Skin: Negative.   Neurological: Positive for headaches. Negative for dizziness, seizures, syncope, facial asymmetry, speech difficulty and light-headedness.  Psychiatric/Behavioral: The patient is nervous/anxious.   All other systems reviewed and are negative.   Allergies  Penicillins; Shellfish allergy; Sulfa antibiotics; and Iodine  Home Medications   Prior  to Admission medications   Medication Sig Start Date End Date Taking? Authorizing Provider  lisinopril (PRINIVIL,ZESTRIL) 5 MG tablet Take 1 tablet (5 mg total) by mouth daily. 12/01/14 12/01/15 Yes Carly J Rivet, MD  LORazepam (ATIVAN) 1 MG tablet Take 1 mg by mouth every 8 (eight) hours.   Yes Historical Provider, MD  sertraline (ZOLOFT) 100 MG tablet Take 100 mg by mouth daily.   Yes Historical Provider, MD  albuterol (PROVENTIL HFA;VENTOLIN HFA) 108 (90 BASE) MCG/ACT inhaler Inhale 2 puffs into the lungs every 6 (six) hours as needed for wheezing or shortness of breath.     Historical Provider, MD   Meds Ordered and Administered this Visit  Medications - No data to display  BP 151/85 mmHg  Pulse 86  Temp(Src) 99.1 F (37.3 C) (Oral)  Resp 16  SpO2 97% No data found.   Physical Exam  Constitutional: She is oriented to person, place, and time. She appears well-developed and well-nourished. No distress.  Eyes: Conjunctivae and EOM are normal.  Neck: Normal range of motion. Neck supple.  Cardiovascular: Normal rate, regular rhythm, normal heart sounds and intact distal pulses.   No murmur heard. Pulmonary/Chest: Effort normal and breath sounds normal. No respiratory distress. She has no wheezes. She has no rales. She exhibits tenderness.  The patient points to the left lateral sternal border as the source of chest discomfort. Direct palpation exacerbates her pain substantially and reproduces the pain for which she is complaining.  Musculoskeletal: She exhibits no edema.  Lymphadenopathy:    She has no cervical  adenopathy.  Neurological: She is alert and oriented to person, place, and time. She exhibits normal muscle tone.  Skin: Skin is warm and dry. She is not diaphoretic.  Psychiatric: She has a normal mood and affect.  Nursing note and vitals reviewed.   ED Course  Procedures (including critical care time)  Labs Review Labs Reviewed - No data to display  Imaging  Review No results found.   Visual Acuity Review  Right Eye Distance:   Left Eye Distance:   Bilateral Distance:    Right Eye Near:   Left Eye Near:    Bilateral Near:         MDM   1. Anxiety   2. Costochondritis, acute   3. Adverse effect due to correct medicinal substance, properly given, initial encounter    Reassurance. Ice over the area of soreness of the chest and made take inset of choice if needed. Likely the sensations that the patient attempts to describe are originating from the starting of sertraline. Follow-up here PCP as needed. 4 chest pain, shortness of breath, sensation of rapid heartbeat, abdominal pain, syncope or other problems sick medical attention promptly.    Hayden Rasmussen, NP 12/09/14 [phone]

## 2015-01-23 ENCOUNTER — Encounter: Payer: Self-pay | Admitting: Internal Medicine

## 2015-01-23 ENCOUNTER — Ambulatory Visit (INDEPENDENT_AMBULATORY_CARE_PROVIDER_SITE_OTHER): Payer: Managed Care, Other (non HMO) | Admitting: Internal Medicine

## 2015-01-23 VITALS — BP 149/73 | HR 89 | Temp 98.0°F | Wt 294.0 lb

## 2015-01-23 DIAGNOSIS — Z0000 Encounter for general adult medical examination without abnormal findings: Secondary | ICD-10-CM

## 2015-01-23 DIAGNOSIS — D509 Iron deficiency anemia, unspecified: Secondary | ICD-10-CM | POA: Diagnosis not present

## 2015-01-23 DIAGNOSIS — Z111 Encounter for screening for respiratory tuberculosis: Secondary | ICD-10-CM

## 2015-01-23 DIAGNOSIS — Z021 Encounter for pre-employment examination: Secondary | ICD-10-CM | POA: Diagnosis not present

## 2015-01-23 DIAGNOSIS — Z23 Encounter for immunization: Secondary | ICD-10-CM | POA: Diagnosis not present

## 2015-01-23 DIAGNOSIS — R002 Palpitations: Secondary | ICD-10-CM | POA: Diagnosis not present

## 2015-01-23 MED ORDER — FERROUS SULFATE 325 (65 FE) MG PO TABS: 325.0000 mg | ORAL_TABLET | Freq: Every day | ORAL | Status: DC

## 2015-01-23 NOTE — Patient Instructions (Signed)
Thank you for coming to see me today. It was a pleasure. Today we talked about:   - your iron deficiency anemia.  I have sent in a prescription to your pharmacy for iron  to take nightly - your immunizations.  We are checking your immunity to Measles, Mumps, and Rubella.  We are checking your immunity to Varicella.  Based on these results, you may need further immunization.  We gave you a tetanus booster, flu shot, and TB skin test.  Please return to the office in 48 hours to have your TB test read.  You have already received the Hepatitis B series of vaccines.  Please follow-up with [place] in 48 hours to have your TB test read.  Otherwise, you can follow up with Dr. Beckie Salts as previously scheduled.  If you have any questions or concerns, please do not hesitate to call the office at [phone].  Take Care,   Gwynn Burly, DO

## 2015-01-23 NOTE — Assessment & Plan Note (Signed)
Assessment: Patient with periodic palpitations concerning for a possible arrhythmia.  No associated syncopal episodes.  EKG evaluated at last office visit with only remarkable changes being Twave inversion in leads III and aVF.  However, low suspicion for acute ischemia given that she was asymptomatic.  Evaluation for palpitations included checking TSH and CBC.  TSH normal to rule out hyperthyroidism.  CBC did reveal anemia which may be contributing.  Caffeine used discussed today and patient reports infrequent caffeine intake.  Patient also having increased anxiety in recent months due to passing away of her mother.  Follows with pyschiatry and receives Sertaline and Lorazepam from them.  Today, she had symptoms of palpitations this morning that she reports was relieved with dose of Ativan.  Plan: - suspect palpitations are multifactorial given her anemia and anxiety - plan to treat anemia with iron supplementation, check B12 - continue sertraline and lorazepam per psychiatry

## 2015-01-23 NOTE — Assessment & Plan Note (Signed)
Assessment: Patient presents today needing vaccines for employment purposes.  Plan: - MMR titer - Varicella titer - Tdap given - Influenza given - Hep B series already completed - TB skin test done - patient to be seen back in 48-72 hours to evaluate PPD test.  Can determine what further vaccines she may need based on titer results.

## 2015-01-23 NOTE — Assessment & Plan Note (Addendum)
Assessment: Patient with Hgb 9.9, MCV 65, RDW 18.3, iron 14, and ferritin 73 suggestive of iron deficiency anemia as the source.  Per chart review, she has been anemic for some time without any treatment.  Denies any symptoms of anemia (dizziness, fatigue, SOB) but her palpitations may be in part contributed by her chronic anemia.  She denies heavy menses and, in fact, reports infrequent menses.  She does state that she occasionally notices blood in her stool when she wipes but otherwise does not notice melena or BRBPR.  She denies any family history of colon cancer and states that she will sporadically have spontaneous epistaxis.  B12 was borderline low in October as well.  Plan: - Start ferrous sulfate 325mg  qhs - Check vitamin B12 and pursue further work up pending those results  ADDENDUM 01/25/2015 09:00am - B12 borderline low 283 - check MMA

## 2015-01-23 NOTE — Progress Notes (Signed)
Patient ID: Victoria Booth, female   DOB: 1980/08/20, 34 y.o.   MRN: [id number]   Subjective:   Patient ID: Victoria Booth female   DOB: [birth year] 33 y.o.   MRN: [id number]  HPI: [name] is a 34 y.o. female with past medical history as outlined below who presents today for vaccinations for employment.  Please see assessment and plan for status of patient chronic medical conditions.    Past Medical History  Diagnosis Date  . Asthma   . Obesity   . Hypertension   . Anemia    Current Outpatient Prescriptions  Medication Sig Dispense Refill  . albuterol (PROVENTIL HFA;VENTOLIN HFA) 108 (90 BASE) MCG/ACT inhaler Inhale 2 puffs into the lungs every 6 (six) hours as needed for wheezing or shortness of breath.     . ferrous sulfate 325 (65 FE) MG tablet Take 1 tablet (325 mg total) by mouth daily. 30 tablet 3  . lisinopril (PRINIVIL,ZESTRIL) 5 MG tablet Take 1 tablet (5 mg total) by mouth daily. 30 tablet 2  . LORazepam (ATIVAN) 1 MG tablet Take 1 mg by mouth every 8 (eight) hours.    . sertraline (ZOLOFT) 100 MG tablet Take 100 mg by mouth daily.    . [DISCONTINUED] ipratropium (ATROVENT) 0.06 % nasal spray Place 2 sprays into both nostrils 4 (four) times daily. (Patient not taking: Reported on 08/25/2014) 15 mL 1   No current facility-administered medications for this visit.   Family History  Problem Relation Age of Onset  . Diabetes Mother   . Hypertension Mother    Social History   Social History  . Marital Status: Single    Spouse Name: N/A  . Number of Children: N/A  . Years of Education: N/A   Social History Main Topics  . Smoking status: Light Tobacco Smoker -- 0.10 packs/day    Types: Cigarettes  . Smokeless tobacco: None     Comment: 1 cig / month.  . Alcohol Use: No  . Drug Use: No  . Sexual Activity: Not Asked   Other Topics Concern  . None   Social History Narrative   Review of Systems: Review of Systems  Constitutional: Negative for fever  and chills.  HENT: Negative for congestion and sore throat.   Eyes: Negative for blurred vision and pain.  Respiratory: Negative for cough and shortness of breath.   Cardiovascular: Positive for palpitations. Negative for chest pain and leg swelling.  Gastrointestinal: Negative for heartburn, nausea and vomiting.  Genitourinary: Negative for dysuria.  Musculoskeletal: Negative for back pain and falls.  Skin: Negative for rash.  Neurological: Negative for dizziness, loss of consciousness and headaches.  Psychiatric/Behavioral: Positive for depression. The patient is nervous/anxious.     Objective:  Physical Exam: Filed Vitals:   01/23/15 1426  BP: 149/73  Pulse: 89  Temp: 98 F (36.7 C)  TempSrc: Oral  Weight: 294 lb (133.358 kg)  SpO2: 98%   Physical Exam  Constitutional: She is oriented to person, place, and time. She appears well-developed and well-nourished.  HENT:  Head: Normocephalic and atraumatic.  Eyes: EOM are normal.  Neck: Normal range of motion.  Cardiovascular: Normal rate, regular rhythm and normal heart sounds.   Pulmonary/Chest: Effort normal and breath sounds normal.  Abdominal: Soft. There is no tenderness.  Musculoskeletal: Normal range of motion.  Neurological: She is alert and oriented to person, place, and time.  Skin: Skin is warm and dry.  Psychiatric: She has a normal mood and  affect.    Assessment & Plan:   Please see problem list for assessment and plan.  Case discussed with Dr. Rogelia Boga.

## 2015-01-24 LAB — MEASLES/MUMPS/RUBELLA IMMUNITY
MUMPS ABS, IGG: >300 AU/mL (ref >10.9)
RUBELLA: 5.19 {index} (ref >0.99)
RUBEOLA AB, IGG: 255 AU/mL (ref >29.9)

## 2015-01-24 LAB — VARICELLA ZOSTER ABS, IGG/IGM: Varicella zoster IgG: 1900 index (ref >165)

## 2015-01-24 LAB — VITAMIN B12: Vitamin B-12: 283 pg/mL (ref 211–946)

## 2015-01-24 NOTE — Progress Notes (Signed)
Internal Medicine Clinic Attending  I saw and evaluated the patient.  I personally confirmed the key portions of the history and exam documented by Dr. Wallace and I reviewed pertinent patient test results.  The assessment, diagnosis, and plan were formulated together and I agree with the documentation in the resident's note. 

## 2015-01-25 NOTE — Addendum Note (Signed)
Addended by: Kathlynn Grate on: 01/25/2015 08:59 AM   Modules accepted: Orders

## 2015-01-26 LAB — SPECIMEN STATUS REPORT

## 2015-01-31 LAB — SPECIMEN STATUS REPORT

## 2015-01-31 LAB — METHYLMALONIC ACID, SERUM: METHYLMALONIC ACID: 88 nmol/L (ref 0–378)

## 2015-02-01 ENCOUNTER — Encounter: Payer: Self-pay | Admitting: Internal Medicine

## 2015-02-01 ENCOUNTER — Ambulatory Visit (INDEPENDENT_AMBULATORY_CARE_PROVIDER_SITE_OTHER): Payer: Managed Care, Other (non HMO) | Admitting: Internal Medicine

## 2015-02-01 VITALS — BP 140/78 | HR 72 | Temp 97.1°F | Ht 66.0 in | Wt 297.1 lb

## 2015-02-01 DIAGNOSIS — H6691 Otitis media, unspecified, right ear: Secondary | ICD-10-CM | POA: Diagnosis not present

## 2015-02-01 DIAGNOSIS — J45901 Unspecified asthma with (acute) exacerbation: Secondary | ICD-10-CM | POA: Diagnosis not present

## 2015-02-01 DIAGNOSIS — Z7951 Long term (current) use of inhaled steroids: Secondary | ICD-10-CM

## 2015-02-01 DIAGNOSIS — H6501 Acute serous otitis media, right ear: Secondary | ICD-10-CM

## 2015-02-01 DIAGNOSIS — H6690 Otitis media, unspecified, unspecified ear: Secondary | ICD-10-CM | POA: Insufficient documentation

## 2015-02-01 DIAGNOSIS — J4521 Mild intermittent asthma with (acute) exacerbation: Secondary | ICD-10-CM

## 2015-02-01 MED ORDER — PREDNISONE 20 MG PO TABS: 30.0000 mg | ORAL_TABLET | Freq: Every day | ORAL | Status: DC

## 2015-02-01 MED ORDER — ALBUTEROL SULFATE HFA 108 (90 BASE) MCG/ACT IN AERS: 2.0000 | INHALATION_SPRAY | Freq: Four times a day (QID) | RESPIRATORY_TRACT | Status: DC | PRN

## 2015-02-01 MED ORDER — AZITHROMYCIN 250 MG PO TABS: ORAL_TABLET | ORAL | Status: AC

## 2015-02-01 NOTE — Assessment & Plan Note (Signed)
Since her viral upper respiratory infection began, she has had wheezing mostly at night; she has been using her albuterol inhaler periodically, but says this doesn't help as much as the nebulizer. She has been hospitalized for asthma exacerbations in the past, but has never been intubated. Today, on exam, she is not wheezing and is in no respiratory distress. However, I fear her breathing could quickly deteriorate. Thus, I've provided her with a spacer for her albuterol inhaler, and I'll also treat her with 30 mg prednisone for 3 days for an acute asthma exacerbation.

## 2015-02-01 NOTE — Patient Instructions (Signed)
Miss Marx,  It was great to meet you today.  I think you have a bacterial infection in her ear. I prescribed you a Z-Pak.  For your asthma, I've refilled your albuterol, given you a spacer that will help get this medication down in your lungs. I've also given you a 3 day course of prednisone.  I hope we get this knocked out, see can start feeling better Christmas with your new fianc.  Take care, Dr. Earnest Conroy

## 2015-02-01 NOTE — Assessment & Plan Note (Addendum)
I believe she originally had a viral URI, now with superimposed acute otitis media, as she has otalgia with and erythematous and bulging right tympanic membrane. The tympanic membrane is only mildly inflamed, and I don't think this is bullous myringitis. She has had an urticarial reaction to penicillin, so I prescribed her a five-day course of azithromycin, which will cover Mycoplasma as well.

## 2015-02-01 NOTE — Progress Notes (Signed)
I saw and evaluated the patient.  I personally confirmed the key portions of Dr. Flores' history and exam and reviewed pertinent patient test results.  The assessment, diagnosis, and plan were formulated together and I agree with the documentation in the resident's note. 

## 2015-02-01 NOTE — Progress Notes (Signed)
Patient ID: Victoria Booth, female   DOB: 1980-07-02, 34 y.o.   MRN: [id number] [facility] Subjective:   Patient ID: Victoria Booth female   DOB: 1980/03/27 34 y.o.   MRN: [id number]  HPI: Victoria Booth is a 34 y.o. female with a history of mild intermittent asthma, hypertension, and obesity presenting to clinic with otalgia, fever, headache, and wheezing.  Ten days ago, she felt a mild pharyngitis that got progressively worse as the day. The following day, she felt wheezy and short of breath, but this was alleviated by taking her albuterol. She started feeling slightly better, until 2 days ago, when she spiked a fever of 101, and she had  Past Medical History  Diagnosis Date  . Asthma   . Obesity   . Hypertension   . Anemia    Current Outpatient Prescriptions  Medication Sig Dispense Refill  . albuterol (PROVENTIL HFA;VENTOLIN HFA) 108 (90 BASE) MCG/ACT inhaler Inhale 2 puffs into the lungs every 6 (six) hours as needed for wheezing or shortness of breath. 3.7 g 3  . azithromycin (ZITHROMAX) 250 MG tablet Take 2 tablets (500 mg) on  Day 1,  followed by 1 tablet (250 mg) once daily on Days 2 through 5. 6 each 0  . ferrous sulfate 325 (65 FE) MG tablet Take 1 tablet (325 mg total) by mouth daily. 30 tablet 3  . lisinopril (PRINIVIL,ZESTRIL) 5 MG tablet Take 1 tablet (5 mg total) by mouth daily. 30 tablet 2  . LORazepam (ATIVAN) 1 MG tablet Take 1 mg by mouth every 8 (eight) hours.    . predniSONE (DELTASONE) 20 MG tablet Take 1.5 tablets (30 mg total) by mouth daily. 3 tablet 0  . sertraline (ZOLOFT) 100 MG tablet Take 100 mg by mouth daily.    . [DISCONTINUED] ipratropium (ATROVENT) 0.06 % nasal spray Place 2 sprays into both nostrils 4 (four) times daily. (Patient not taking: Reported on 08/25/2014) 15 mL 1   No current facility-administered medications for this visit.   Family History  Problem Relation Age of Onset  . Diabetes Mother   . Hypertension  Mother    Social History   Social History  . Marital Status: Single    Spouse Name: N/A  . Number of Children: N/A  . Years of Education: N/A   Social History Main Topics  . Smoking status: Light Tobacco Smoker -- 0.10 packs/day    Types: Cigarettes  . Smokeless tobacco: None     Comment: 1 cig / month.  . Alcohol Use: No  . Drug Use: No  . Sexual Activity: Not Asked   Other Topics Concern  . None   Social History Narrative   Review of Systems  Constitutional: Positive for fever, chills and malaise/fatigue.  HENT: Positive for congestion, ear discharge, ear pain and sore throat.   Eyes: Negative for blurred vision and double vision.  Respiratory: Positive for cough, shortness of breath and wheezing. Negative for hemoptysis and sputum production.   Cardiovascular: Negative for chest pain and leg swelling.  Gastrointestinal: Negative for abdominal pain and diarrhea.  Musculoskeletal: Negative for myalgias.  Skin: Negative for itching and rash.  Neurological: Positive for headaches. Negative for dizziness and loss of consciousness.   Objective:  Physical Exam: Filed Vitals:   02/01/15 1421  BP: 140/78  Pulse: 72  Temp: 97.1 F (36.2 C)  TempSrc: Oral  Height:  (1.676 m)  Weight: 297 lb 1.6 oz (134.764 kg)  SpO2: 100%   General: resting in chair comfortably, friendly and conversational HEENT: no scleral icterus, extra-ocular muscles intact, right tympanic membrane bulging and erythematous, without bullae, oropharynx without lesions Cardiac: regular rate and rhythm, no rubs, murmurs or gallops Pulm: breathing well, clear to auscultation bilaterally Abd: bowel sounds normal, soft, nondistended, non-tender Ext: warm and well perfused, without pedal edema Lymph: no cervical or supraclavicular lymphadenopathy Skin: no rash, hair, or nail changes Neuro: alert and oriented X3, cranial nerves II-XII grossly intact, moving all extremities well  Assessment & Plan:   Case discussed with Dr. Josem Kaufmann  Acute otitis media I believe she originally had a viral URI, now with superimposed acute otitis media, as she has otalgia with and erythematous and bulging right tympanic membrane. The tympanic membrane is only mildly inflamed, and I don't think this is bullous myringitis. She has had an urticarial reaction to penicillin, so I prescribed her a five-day course of azithromycin, which will cover Mycoplasma as well.  Asthma with acute exacerbation Since her viral upper respiratory infection began, she has had wheezing mostly at night; she has been using her albuterol inhaler periodically, but says this doesn't help as much as the nebulizer. She has been hospitalized for asthma exacerbations in the past, but has never been intubated. Today, on exam, she is not wheezing and is in no respiratory distress. However, I fear her breathing could quickly deteriorate. Thus, I've provided her with a spacer for her albuterol inhaler, and I'll also treat her with 30 mg prednisone for 3 days for an acute asthma exacerbation.    Medications Ordered Meds ordered this encounter  Medications  . albuterol (PROVENTIL HFA;VENTOLIN HFA) 108 (90 BASE) MCG/ACT inhaler    Sig: Inhale 2 puffs into the lungs every 6 (six) hours as needed for wheezing or shortness of breath.    Dispense:  3.7 g    Refill:  3  . predniSONE (DELTASONE) 20 MG tablet    Sig: Take 1.5 tablets (30 mg total) by mouth daily.    Dispense:  3 tablet    Refill:  0  . azithromycin (ZITHROMAX) 250 MG tablet    Sig: Take 2 tablets (500 mg) on  Day 1,  followed by 1 tablet (250 mg) once daily on Days 2 through 5.    Dispense:  6 each    Refill:  0   Other Orders No orders of the defined types were placed in this encounter.   Follow Up: Return if symptoms worsen or fail to improve.

## 2015-04-13 ENCOUNTER — Encounter: Payer: Self-pay | Admitting: Internal Medicine

## 2015-04-13 ENCOUNTER — Ambulatory Visit: Payer: Managed Care, Other (non HMO) | Admitting: Internal Medicine

## 2015-04-19 ENCOUNTER — Ambulatory Visit (INDEPENDENT_AMBULATORY_CARE_PROVIDER_SITE_OTHER): Payer: Managed Care, Other (non HMO) | Admitting: Internal Medicine

## 2015-04-19 ENCOUNTER — Ambulatory Visit (HOSPITAL_COMMUNITY)
Admission: RE | Admit: 2015-04-19 | Discharge: 2015-04-19 | Disposition: A | Discharge status: Home / Self Care | Payer: Managed Care, Other (non HMO) | Source: Ambulatory Visit | Attending: Internal Medicine | Admitting: Internal Medicine

## 2015-04-19 ENCOUNTER — Encounter: Payer: Self-pay | Admitting: Internal Medicine

## 2015-04-19 VITALS — BP 135/70 | HR 77 | Temp 97.7°F | Ht 66.0 in | Wt 311.0 lb

## 2015-04-19 DIAGNOSIS — R0602 Shortness of breath: Secondary | ICD-10-CM | POA: Insufficient documentation

## 2015-04-19 DIAGNOSIS — J45909 Unspecified asthma, uncomplicated: Secondary | ICD-10-CM

## 2015-04-19 DIAGNOSIS — I10 Essential (primary) hypertension: Secondary | ICD-10-CM

## 2015-04-19 DIAGNOSIS — Z79899 Other long term (current) drug therapy: Secondary | ICD-10-CM

## 2015-04-19 DIAGNOSIS — F17201 Nicotine dependence, unspecified, in remission: Secondary | ICD-10-CM

## 2015-04-19 DIAGNOSIS — J4521 Mild intermittent asthma with (acute) exacerbation: Secondary | ICD-10-CM

## 2015-04-19 MED ORDER — PREDNISONE 20 MG PO TABS: 40.0000 mg | ORAL_TABLET | Freq: Every day | ORAL | Status: DC

## 2015-04-19 MED ORDER — ALBUTEROL SULFATE HFA 108 (90 BASE) MCG/ACT IN AERS: 2.0000 | INHALATION_SPRAY | Freq: Four times a day (QID) | RESPIRATORY_TRACT | Status: DC | PRN

## 2015-04-19 MED ORDER — ALBUTEROL SULFATE (2.5 MG/3ML) 0.083% IN NEBU
2.5000 mg | INHALATION_SOLUTION | Freq: Once | RESPIRATORY_TRACT | Status: AC
  Administered 2015-04-19: 2.5 mg via RESPIRATORY_TRACT

## 2015-04-19 NOTE — Patient Instructions (Signed)
We will be getting a chest xray on you today, and if it is abnormal you will be called to get an antibiotic. Otherwise use your inhaler every 6 hrs for now, till you feel better. I will also send you a prescription for prednisione- take 40mg  daily for 4 days, 30mg  daily for 3 days, 20mg  daily for 2 days, and then 10mg  for 1 day.  If you do not feel better in a week, please come back to the clinic. Otherwise your next appointment will be in 4 months.

## 2015-04-19 NOTE — Progress Notes (Signed)
Patient ID: Victoria Booth, female   DOB: 1980-10-25, 35 y.o.   MRN: [id number]   Subjective:   Patient ID: Victoria Booth female   DOB: 07-Mar-1980 35 y.o.   MRN: [id number]  HPI: [name] is a 35 y.o. with PMH listed below , presented today with c/o SOB that started yesterday afternoon, she has tried her inhalers without relief- Albuterol. She is not on any other inhalers. She started coughing last week cough is dry, no sore throat, no rhinorrhea, no myalgias, no fever, no chills, got the flu shot this year. No personal hx of blood clots, no family hx of blood clots excep for grandmothe that had DVT in the setting of LE surgery. She uses 1 pillow to sleep, no leg swelling, has a positive hx of sick contacts. No chest pain.   Past Medical History  Diagnosis Date  . Asthma   . Obesity   . Hypertension   . Anemia    Review of Systems: CONSTITUTIONAL- No Fever, weightloss, or change in appetite. SKIN- No Rash, colour changes or itching. HEAD- No Headache or dizziness. Mouth/throat- No Sorethroat, or bleeding gums. GI- No, vomiting, diarrhoea, abd pain. URINARY- No Frequency, or dysuria. NEUROLOGIC- No Numbness, or burning. [facility]- Denies depression or anxiety.  Objective:  Physical Exam: Filed Vitals:   04/19/15 1346  BP: 135/70  Pulse: 77  Temp: 97.7 F (36.5 C)  TempSrc: Oral  Height: 5\' 6"  (1.676 m)  Weight: 311 lb (141.069 kg)  SpO2: 99%   GENERAL- alert, co-operative, appears as stated age, not in any distress, O2 sats 99% HEENT- Atraumatic, normocephalic, PERRL, EOMI, oral mucosa appears moist, has bilateral pharhyngeal erythema without exudates, and no complaints of sorethroat, CARDIAC- RRR, no murmurs, rubs or gallops. RESP- Air entry reduced in all lung fields, no wheezing appreciated,normal work of breathing, appears comfortable. ABDOMEN- Soft, nontender, bowel sounds present. NEURO- Alert and oriented , Gait- Normal. EXTREMITIES- Warm, well perfused, no  pedal edema, no calf tenderness, no redness. SKIN- Warm, dry, No rash or lesion. PSYCH- Normal mood and affect, appropriate thought content and speech.  Assessment & Plan:   The patient's case and plan of care was discussed with attending physician, Dr. Erlinda Hong.  Please see problem based charting for assessment and plan.

## 2015-04-19 NOTE — Assessment & Plan Note (Addendum)
Most likely asthma exacerbation, provoked by viral infection. SOB started yesterday, also has been having a minimally productive to dry cough over the past week, with pharyngeal erythema on exam. She has no alarm features. No risk factors for PE or CAD. She has no features to suggest a PNA. She has been using her inhaler without mild relief. Exam suggestive of decreased air entry. She has been hospitalized in the past for asthma exacerbation, but never been intubated. She is only on a s needed albuterol, she has not had to use her inhaler in ~2 months. Pt looks very comfortable to day, no increased work of breathing.  Plan Most likely an asthma exacerbation, provoked by URTI. - Ambulatory O2 sats- > 97% - Albuterol nebs x1 here in clinic. - see in 1 week if no improvement. -Chest xray- negative for infection. - Prednisone 40mg  daily for 4 days, with taper- 30mg  daily fro 3 days, then 20mg  daily for 2 days, then 10mg  daily X1. - Albuterol inhaler Q4H.

## 2015-04-19 NOTE — Assessment & Plan Note (Signed)
BP Readings from Last 3 Encounters:  04/19/15 135/70  02/01/15 140/78  01/23/15 149/73    Lab Results  Component Value Date   NA 141 12/01/2014   K 4.3 12/01/2014   CREATININE 0.60 12/01/2014    Assessment: Blood pressure control:  Acceptable.  Comments: Pt has not taken the 5mg  daily lisinopril prescribed for at least 1 month, since she ran out. She is working on [employer], started a diet this week, and increased exercise. She quit smoking 1 and a half month ago.   Plan:Other plans: D/c lisinopirl - Congratulated on quitting smoking - Encouraged lifestyle modification.

## 2015-04-21 NOTE — Progress Notes (Signed)
Internal Medicine Clinic Attending  Case discussed with Dr. Emokpae at the time of the visit.  We reviewed the resident's history and exam and pertinent patient test results.  I agree with the assessment, diagnosis, and plan of care documented in the resident's note.  

## 2015-04-21 NOTE — Addendum Note (Signed)
Addended by: Erlinda Hong T on: 04/21/2015 03:06 PM   Modules accepted: Level of Service

## 2015-04-22 ENCOUNTER — Encounter (HOSPITAL_COMMUNITY): Payer: Self-pay | Admitting: Emergency Medicine

## 2015-04-22 ENCOUNTER — Emergency Department (HOSPITAL_COMMUNITY)
Admission: EM | Admit: 2015-04-22 | Discharge: 2015-04-23 | Disposition: A | Discharge status: Home / Self Care | Payer: Managed Care, Other (non HMO) | Attending: Emergency Medicine | Admitting: Emergency Medicine

## 2015-04-22 DIAGNOSIS — F17210 Nicotine dependence, cigarettes, uncomplicated: Secondary | ICD-10-CM | POA: Insufficient documentation

## 2015-04-22 DIAGNOSIS — Y9389 Activity, other specified: Secondary | ICD-10-CM | POA: Insufficient documentation

## 2015-04-22 DIAGNOSIS — R2243 Localized swelling, mass and lump, lower limb, bilateral: Secondary | ICD-10-CM | POA: Insufficient documentation

## 2015-04-22 DIAGNOSIS — E669 Obesity, unspecified: Secondary | ICD-10-CM | POA: Insufficient documentation

## 2015-04-22 DIAGNOSIS — Y9289 Other specified places as the place of occurrence of the external cause: Secondary | ICD-10-CM | POA: Insufficient documentation

## 2015-04-22 DIAGNOSIS — Y998 Other external cause status: Secondary | ICD-10-CM | POA: Insufficient documentation

## 2015-04-22 DIAGNOSIS — W1839XA Other fall on same level, initial encounter: Secondary | ICD-10-CM | POA: Insufficient documentation

## 2015-04-22 DIAGNOSIS — R229 Localized swelling, mass and lump, unspecified: Secondary | ICD-10-CM

## 2015-04-22 DIAGNOSIS — Z880 Allergy status to penicillin: Secondary | ICD-10-CM | POA: Insufficient documentation

## 2015-04-22 DIAGNOSIS — J45901 Unspecified asthma with (acute) exacerbation: Secondary | ICD-10-CM | POA: Insufficient documentation

## 2015-04-22 DIAGNOSIS — I10 Essential (primary) hypertension: Secondary | ICD-10-CM | POA: Insufficient documentation

## 2015-04-22 DIAGNOSIS — Z79899 Other long term (current) drug therapy: Secondary | ICD-10-CM | POA: Insufficient documentation

## 2015-04-22 DIAGNOSIS — D649 Anemia, unspecified: Secondary | ICD-10-CM | POA: Insufficient documentation

## 2015-04-22 DIAGNOSIS — S80212A Abrasion, left knee, initial encounter: Secondary | ICD-10-CM

## 2015-04-22 DIAGNOSIS — M79662 Pain in left lower leg: Secondary | ICD-10-CM

## 2015-04-22 NOTE — ED Notes (Signed)
Pt has knots in both legs that she says moved from the back of her calf to the front of her leg. Pt has pain in the back of her calf in both legs. Pt has leg pain as well. Pt's legs are not red nor are they hot to the touch. Pt states her left leg is "tingly" and due to this, today she fell and injured her knee. There is a scab and slight welling. Pt is able to ambulate.

## 2015-04-22 NOTE — ED Provider Notes (Signed)
CSN: [id number]     Arrival date & time 04/22/15  2237 History   By signing my name below, I, Victoria Booth, attest that this documentation has been prepared under the direction and in the presence of Devoria Albe, MD at [phone].  Electronically Signed: Arlan Booth, ED Scribe. 04/23/2015. 12:45 AM.   Chief Complaint  Patient presents with  . Leg Pain   The history is provided by the patient. No language interpreter was used.    HPI Comments: Victoria Booth is a 35 y.o. female with a PMHx of HTN who presents to the Emergency Department complaining of persistent, recurrent tender "knots" to the legs bilaterally x 1 month. Pt states knots occasionally move from the back of her calf to the front of her leg. It started on the right and today noticed it on the left. No aggravating or alleviating factors at this time. Pt sustained a fall today as her left knee "gave out of me". She fell onto her left knee and sustained an abrasion. She was evaluated at her PCP office 4 days ago for intermittent, ongoing shortness of breath and was prescribed a course of Prednisone and an albuterol inhaler for her asthma flare-up. . No recent  fever, chills, joint pains, SOB, chest pain, nausea, or vomiting. Pt admits she occasionally smokes. She is not currently on any birth control.     PCP: Rich Number, MD    Past Medical History  Diagnosis Date  . Asthma   . Obesity   . Hypertension   . Anemia    Past Surgical History  Procedure Laterality Date  . Toenail excision     Family History  Problem Relation Age of Onset  . Diabetes Mother   . Hypertension Mother    Social History  Substance Use Topics  . Smoking status: Light Tobacco Smoker -- 0.10 packs/day    Types: Cigarettes  . Smokeless tobacco: None     Comment: 1 cig / month.  . Alcohol Use: No  does not buy cigarettes, but smokes sometimes employed  OB History    No data available     Review of Systems  Constitutional: Negative for fever  and chills.  Respiratory: Positive for shortness of breath.   Cardiovascular: Negative for leg swelling.  Gastrointestinal: Negative for nausea, vomiting and abdominal pain.  Musculoskeletal: Positive for arthralgias.  Psychiatric/Behavioral: Negative for confusion.  All other systems reviewed and are negative.     Allergies  Penicillins; Shellfish allergy; Sulfa antibiotics; and Iodine  Home Medications   Prior to Admission medications   Medication Sig Start Date End Date Taking? Authorizing Provider  albuterol (PROVENTIL HFA;VENTOLIN HFA) 108 (90 Base) MCG/ACT inhaler Inhale 2 puffs into the lungs every 6 (six) hours as needed for wheezing or shortness of breath. 04/19/15   Ejiroghene Wendall Stade, MD  ferrous sulfate 325 (65 FE) MG tablet Take 1 tablet (325 mg total) by mouth daily. 01/23/15 01/23/16  Gwynn Burly, DO  lisinopril (PRINIVIL,ZESTRIL) 5 MG tablet Take 1 tablet (5 mg total) by mouth daily. 12/01/14 12/01/15  Carly J Rivet, MD  LORazepam (ATIVAN) 1 MG tablet Take 1 mg by mouth every 8 (eight) hours.    Historical Provider, MD  predniSONE (DELTASONE) 20 MG tablet Take 2 tablets (40 mg total) by mouth daily with breakfast.  daily- 4 days,  daily- 3 days,  daily- 2 days, then  -1 day. 04/19/15   Ejiroghene Wendall Stade, MD  sertraline (ZOLOFT) 100 MG tablet Take  100 mg by mouth daily.    Historical Provider, MD   Triage Vitals: BP 138/79 mmHg  Pulse 80  Temp(Src) 98.1 F (36.7 C) (Oral)  Resp 18  Wt 306 lb 7 oz (139 kg)  SpO2 100%  LMP 01/29/2015  Vital signs normal     Physical Exam  Constitutional: She is oriented to person, place, and time. She appears well-developed and well-nourished.  Non-toxic appearance. She does not appear ill. No distress.  HENT:  Head: Normocephalic and atraumatic.  Right Ear: External ear normal.  Left Ear: External ear normal.  Nose: Nose normal. No mucosal edema or rhinorrhea.  Mouth/Throat: Mucous membranes are normal. No  dental abscesses or uvula swelling.  Eyes: Conjunctivae and EOM are normal. Pupils are equal, round, and reactive to light.  Neck: Normal range of motion and full passive range of motion without pain. Neck supple.  Pulmonary/Chest: Effort normal. She has no wheezes. She has no rhonchi. She exhibits no crepitus.  Abdominal: Normal appearance.  Musculoskeletal: Normal range of motion. She exhibits tenderness. She exhibits no edema.  Super abrasion to L knee without swelling. Moves all extremities well. Calf is soft, tender on the left without cords.  Neurological: She is alert and oriented to person, place, and time. She has normal strength. No cranial nerve deficit.  Skin: Skin is warm, dry and intact. No rash noted. No erythema. No pallor.  2-3 subcutaneous nodules on later aspect of both lower extremities Normal skin color No redness or warmth to skin   Psychiatric: She has a normal mood and affect. Her speech is normal and behavior is normal. Her mood appears not anxious.  Nursing note and vitals reviewed.   ED Course  Procedures (including critical care time)  Medications  enoxaparin (LOVENOX) injection 140 mg (not administered)     DIAGNOSTIC STUDIES: Oxygen Saturation is 100% on RA, Normal by my interpretation.    COORDINATION OF CARE: 12:37 AM- Will give Lovenox. Discussed treatment plan with pt at bedside and pt agreed to plan.    Pt is to return this morning at [facility] to get an outpatient doppler US of her left leg.  She was given lovenox 1 mg/kg [place] to cover her until she gets her test done. The subcutaneous nodules on her legs may be erythema nodosum. She does not describe recent sore throat, being on birth control pills, or having joint pains. She was advised to use ice packs and take ibuprofen over-the-counter for the pain.    MDM   Final diagnoses:  Calf pain, left  Subcutaneous nodules  Abrasion, knee, left, initial encounter    Plan discharge  Devoria Albe, MD,  FACEP   I personally performed the services described in this documentation, which was scribed in my presence. The recorded information has been reviewed and considered.  Devoria Albe, MD, Concha Pyo, MD 04/23/15 [phone]

## 2015-04-23 ENCOUNTER — Ambulatory Visit (HOSPITAL_BASED_OUTPATIENT_CLINIC_OR_DEPARTMENT_OTHER)
Admission: RE | Admit: 2015-04-23 | Discharge: 2015-04-23 | Disposition: A | Discharge status: Home / Self Care | Payer: Managed Care, Other (non HMO) | Source: Ambulatory Visit | Attending: Emergency Medicine | Admitting: Emergency Medicine

## 2015-04-23 DIAGNOSIS — M79605 Pain in left leg: Secondary | ICD-10-CM | POA: Insufficient documentation

## 2015-04-23 DIAGNOSIS — M79609 Pain in unspecified limb: Secondary | ICD-10-CM | POA: Diagnosis not present

## 2015-04-23 MED ORDER — ENOXAPARIN SODIUM 150 MG/ML ~~LOC~~ SOLN
1.0000 mg/kg | Freq: Once | SUBCUTANEOUS | Status: AC
  Administered 2015-04-23: 140 mg via SUBCUTANEOUS
  Filled 2015-04-23: qty 0.94

## 2015-04-23 NOTE — Progress Notes (Signed)
.  VASCULAR LAB PRELIMINARY  PRELIMINARY  PRELIMINARY  PRELIMINARY  Left lower extremity venous duplex completed.    Preliminary report:  Left:  No evidence of DVT, superficial thrombosis, or Baker's cyst.  Victoria Booth, RVT 04/23/2015, 10:49 AM

## 2015-04-23 NOTE — Discharge Instructions (Signed)
These nodules may be erythema nodosum. You can take ibuprofen 600 mg 4 times a day for pain, which will also help with your knee. Keep the abrasion clean and dry. Do use triple antibiotic ointment on the abrasion. Recheck if it gets infected. You can return in the morning at 8 am to [facility] to get your outpatient doppler ultrasound to check your leg for a blood clot. Register in the ED for the test.  Abrasion An abrasion is a cut or scrape on the outer surface of your skin. An abrasion does not extend through all of the layers of your skin. It is important to care for your abrasion properly to prevent infection. CAUSES Most abrasions are caused by falling on or gliding across the ground or another surface. When your skin rubs on something, the outer and inner layer of skin rubs off.  SYMPTOMS A cut or scrape is the main symptom of this condition. The scrape may be bleeding, or it may appear red or pink. If there was an associated fall, there may be an underlying bruise. DIAGNOSIS An abrasion is diagnosed with a physical exam. TREATMENT Treatment for this condition depends on how large and deep the abrasion is. Usually, your abrasion will be cleaned with water and mild soap. This removes any dirt or debris that may be stuck. An antibiotic ointment may be applied to the abrasion to help prevent infection. A bandage (dressing) may be placed on the abrasion to keep it clean. You may also need a tetanus shot. HOME CARE INSTRUCTIONS Medicines  Take or apply medicines only as directed by your health care provider.  If you were prescribed an antibiotic ointment, finish all of it even if you start to feel better. Wound Care  Clean the wound with mild soap and water 2-3 times per day or as directed by your health care provider. Pat your wound dry with a clean towel. Do not rub it.  There are many different ways to close and cover a wound. Follow instructions from your health care provider  about:  Wound care.  Dressing changes and removal.  Check your wound every day for signs of infection. Watch for:  Redness, swelling, or pain.  Fluid, blood, or pus. General Instructions  Keep the dressing dry as directed by your health care provider. Do not take baths, swim, use a hot tub, or do anything that would put your wound underwater until your health care provider approves.  If there is swelling, raise (elevate) the injured area above the level of your heart while you are sitting or lying down.  Keep all follow-up visits as directed by your health care provider. This is important. SEEK MEDICAL CARE IF:  You received a tetanus shot and you have swelling, severe pain, redness, or bleeding at the injection site.  Your pain is not controlled with medicine.  You have increased redness, swelling, or pain at the site of your wound. SEEK IMMEDIATE MEDICAL CARE IF:  You have a red streak going away from your wound.  You have a fever.  You have fluid, blood, or pus coming from your wound.  You notice a bad smell coming from your wound or your dressing.   This information is not intended to replace advice given to you by your health care provider. Make sure you discuss any questions you have with your health care provider.   Document Released: 11/07/2004 Document Revised: 10/19/2014 Document Reviewed: 01/26/2014 Elsevier Interactive Patient Education [employer]  Cryotherapy Cryotherapy is when you put ice on your injury. Ice helps lessen pain and puffiness (swelling) after an injury. Ice works the best when you start using it in the first 24 to 48 hours after an injury. HOME CARE  Put a dry or damp towel between the ice pack and your skin.  You may press gently on the ice pack.  Leave the ice on for no more than 10 to 20 minutes at a time.  Check your skin after 5 minutes to make sure your skin is okay.  Rest at least 20 minutes between ice pack  uses.  Stop using ice when your skin loses feeling (numbness).  Do not use ice on someone who cannot tell you when it hurts. This includes small children and people with memory problems (dementia). GET HELP RIGHT AWAY IF:  You have white spots on your skin.  Your skin turns blue or pale.  Your skin feels waxy or hard.  Your puffiness gets worse. MAKE SURE YOU:   Understand these instructions.  Will watch your condition.  Will get help right away if you are not doing well or get worse.   This information is not intended to replace advice given to you by your health care provider. Make sure you discuss any questions you have with your health care provider.   Document Released: 07/17/2007 Document Revised: 04/22/2011 Document Reviewed: 09/20/2010 Elsevier Interactive Patient Education [employer]

## 2015-09-06 ENCOUNTER — Emergency Department (HOSPITAL_COMMUNITY)
Admission: EM | Admit: 2015-09-06 | Discharge: 2015-09-06 | Disposition: A | Discharge status: Home / Self Care | Payer: BLUE CROSS/BLUE SHIELD | Attending: Emergency Medicine | Admitting: Emergency Medicine

## 2015-09-06 ENCOUNTER — Encounter (HOSPITAL_COMMUNITY): Payer: Self-pay | Admitting: Emergency Medicine

## 2015-09-06 DIAGNOSIS — E669 Obesity, unspecified: Secondary | ICD-10-CM | POA: Insufficient documentation

## 2015-09-06 DIAGNOSIS — I10 Essential (primary) hypertension: Secondary | ICD-10-CM | POA: Insufficient documentation

## 2015-09-06 DIAGNOSIS — Z7951 Long term (current) use of inhaled steroids: Secondary | ICD-10-CM | POA: Insufficient documentation

## 2015-09-06 DIAGNOSIS — J4521 Mild intermittent asthma with (acute) exacerbation: Secondary | ICD-10-CM | POA: Insufficient documentation

## 2015-09-06 DIAGNOSIS — J209 Acute bronchitis, unspecified: Secondary | ICD-10-CM | POA: Insufficient documentation

## 2015-09-06 DIAGNOSIS — F17210 Nicotine dependence, cigarettes, uncomplicated: Secondary | ICD-10-CM | POA: Diagnosis not present

## 2015-09-06 DIAGNOSIS — Z79899 Other long term (current) drug therapy: Secondary | ICD-10-CM | POA: Diagnosis not present

## 2015-09-06 DIAGNOSIS — R0981 Nasal congestion: Secondary | ICD-10-CM | POA: Diagnosis present

## 2015-09-06 MED ORDER — BENZONATATE 100 MG PO CAPS: 100.0000 mg | ORAL_CAPSULE | Freq: Three times a day (TID) | ORAL | 0 refills | Status: DC

## 2015-09-06 MED ORDER — ALBUTEROL SULFATE (2.5 MG/3ML) 0.083% IN NEBU
5.0000 mg | INHALATION_SOLUTION | Freq: Once | RESPIRATORY_TRACT | Status: AC
  Administered 2015-09-06: 5 mg via RESPIRATORY_TRACT
  Filled 2015-09-06: qty 6

## 2015-09-06 MED ORDER — IPRATROPIUM BROMIDE 0.02 % IN SOLN
0.5000 mg | Freq: Once | RESPIRATORY_TRACT | Status: AC
  Administered 2015-09-06: 0.5 mg via RESPIRATORY_TRACT
  Filled 2015-09-06: qty 2.5

## 2015-09-06 MED ORDER — AZITHROMYCIN 250 MG PO TABS: ORAL_TABLET | ORAL | 0 refills | Status: DC

## 2015-09-06 MED ORDER — PREDNISONE 20 MG PO TABS
60.0000 mg | ORAL_TABLET | Freq: Once | ORAL | Status: AC
  Administered 2015-09-06: 60 mg via ORAL
  Filled 2015-09-06: qty 3

## 2015-09-06 MED ORDER — PREDNISONE 20 MG PO TABS: 40.0000 mg | ORAL_TABLET | Freq: Every day | ORAL | 0 refills | Status: DC

## 2015-09-06 MED ORDER — BENZONATATE 100 MG PO CAPS
100.0000 mg | ORAL_CAPSULE | Freq: Once | ORAL | Status: AC
  Administered 2015-09-06: 100 mg via ORAL
  Filled 2015-09-06: qty 1

## 2015-09-06 NOTE — ED Notes (Signed)
Pt up to [facility] desk asking about wait time Pt stating that she's wheezing and its getting harder for her to breathe because of her asthma Pt placed on pulse Ox 100% on RA  HR-81 RN notified

## 2015-09-06 NOTE — ED Triage Notes (Signed)
Pt c/o nasal congestion and sore throat x3 days. Pt also reports N/V/D since yesterday. Denies abdominal pain.

## 2015-09-06 NOTE — ED Provider Notes (Signed)
[facility]-EMERGENCY DEPT Provider Note   CSN: [id number] Arrival date & time: 09/06/15  4098  First Provider Contact:  None       History   Chief Complaint Chief Complaint  Patient presents with  . Sore Throat  . Nasal Congestion  . Emesis    HPI Victoria Booth is a 35 y.o. female.  HPI   35 year old obese female with history of asthma and hypertension presenting with URI symptoms. Patient report for the past 4 days she has had ear pain, sore throat, cough productive with yellow sputum, generalized body aches, and chills. Her cough has been persistent accompanied with wheezing and shortness of breath. She normally only use her inhaler 3 times a week but for the past 2-3 days she has been using it at least 3-5 times daily with minimal improvement. Today she vomits from coughing too hard. She is not a smoker. She denies any recent sick contact. No recent surgery, prolonged bed rest, unilateral leg swelling or calf pain, or active cancer. No prior history of PE or DVT. Denies any allergy symptoms such as runny nose sneezing or itchy eyes. She has been using over-the-counter medication including Mucinex, Allegra-D and other medication without adequate relief.  Past Medical History:  Diagnosis Date  . Anemia   . Asthma   . Hypertension   . Obesity     Patient Active Problem List   Diagnosis Date Noted  . Acute otitis media 02/01/2015  . Asthma with acute exacerbation 02/01/2015  . Essential hypertension, benign 12/01/2014  . Asthma, chronic 12/01/2014  . Microcytic anemia 12/01/2014  . GERD (gastroesophageal reflux disease) 12/01/2014  . Constipation 12/01/2014  . Irregular menstrual cycle 12/01/2014  . Preventative health care 12/01/2014    Past Surgical History:  Procedure Laterality Date  . TOENAIL EXCISION      OB History    No data available       Home Medications    Prior to Admission medications   Medication Sig Start Date End Date Taking? Authorizing  Provider  albuterol (PROVENTIL HFA;VENTOLIN HFA) 108 (90 Base) MCG/ACT inhaler Inhale 2 puffs into the lungs every 6 (six) hours as needed for wheezing or shortness of breath. 04/19/15   Ejiroghene Wendall Stade, MD  ferrous sulfate 325 (65 FE) MG tablet Take 1 tablet (325 mg total) by mouth daily. 01/23/15 01/23/16  Gwynn Burly, DO  lisinopril (PRINIVIL,ZESTRIL) 5 MG tablet Take 1 tablet (5 mg total) by mouth daily. 12/01/14 12/01/15  Carly J Rivet, MD  LORazepam (ATIVAN) 1 MG tablet Take 1 mg by mouth every 8 (eight) hours.    Historical Provider, MD  predniSONE (DELTASONE) 20 MG tablet Take 2 tablets (40 mg total) by mouth daily with breakfast. 40mg  daily- 4 days, 30mg  daily- 3 days, 20mg  daily- 2 days, then 10mg  -1 day. 04/19/15   Ejiroghene Wendall Stade, MD  sertraline (ZOLOFT) 100 MG tablet Take 100 mg by mouth daily.    Historical Provider, MD    Family History Family History  Problem Relation Age of Onset  . Diabetes Mother   . Hypertension Mother     Social History Social History  Substance Use Topics  . Smoking status: Light Tobacco Smoker    Packs/day: 0.10    Types: Cigarettes  . Smokeless tobacco: Not on file     Comment: 1 cig / month.  . Alcohol use No     Allergies   Penicillins; Shellfish allergy; Sulfa antibiotics; and Iodine   Review of  Systems Review of Systems  All other systems reviewed and are negative.    Physical Exam Updated Vital Signs BP (!) 168/104 (BP Location: Right Arm)   Pulse 81   Temp 98.5 F (36.9 C) (Oral)   Resp 16   LMP 08/29/2015   SpO2 100%   Physical Exam  Constitutional: She appears well-developed and well-nourished. No distress.  HENT:  Head: Atraumatic.  Ears: Right TM mildly erythematous, left TM normal Nose: Rhinorrhea with mildly boggy turbinates Throat: Uvula is midline no tonsillar enlargement or exudates, no trismus   Eyes: Conjunctivae are normal.  Neck: Neck supple.  Cardiovascular: Normal rate and regular rhythm.    Pulmonary/Chest: No stridor. She has wheezes (Faint expiratory wheezes with decreased lung sounds.).  Abdominal: Soft. There is no tenderness.  Musculoskeletal: She exhibits no edema.  Neurological: She is alert.  Skin: No rash noted.  Psychiatric: She has a normal mood and affect.  Nursing note and vitals reviewed.    ED Treatments / Results  Labs (all labs ordered are listed, but only abnormal results are displayed) Labs Reviewed - No data to display  EKG  EKG Interpretation None       Radiology No results found.  Procedures Procedures (including critical care time)  Medications Ordered in ED Medications - No data to display   Initial Impression / Assessment and Plan / ED Course  I have reviewed the triage vital signs and the nursing notes.  Pertinent labs & imaging results that were available during my care of the patient were reviewed by me and considered in my medical decision making (see chart for details).  Clinical Course    BP (!) 168/104 (BP Location: Right Arm)   Pulse 81   Temp 98.5 F (36.9 C) (Oral)   Resp 16   LMP 08/29/2015   SpO2 100%  The patient was noted to be hypertensive today in the emergency department. I have spoken with the patient regarding hypertension and the need for improved management. I instructed the patient to followup with the Primary care doctor within 4 days to improve the management of the patient's hypertension. I also counseled the patient regarding the signs and symptoms which would require an emergent visit to an emergency department for hypertensive urgency and/or hypertensive emergency. The patient understood the need for improved hypertensive management.   Final Clinical Impressions(s) / ED Diagnoses   Final diagnoses:  Asthma with bronchitis, mild intermittent, with acute exacerbation    New Prescriptions New Prescriptions   AZITHROMYCIN (ZITHROMAX Z-PAK) 250 MG TABLET    2 po day one, then 1 daily x 4 days    BENZONATATE (TESSALON) 100 MG CAPSULE    Take 1 capsule (100 mg total) by mouth every 8 (eight) hours.   1:16 PM Patient here with symptoms suggestive of an asthma exacerbation secondary to viral upper respiratory infection. She does have persistent cough and faint wheezes on exam however oxygenation status is stable. Plan to provide steroid, cough medication, and encouraged patient to continue using the albuterol rescue inhaler. I will also provide a short course of Z-Pak to treat for potential atypical infection. I have low suspicion for PE or DVT or other acute emergent medical condition.    Fayrene Helper, PA-C 09/06/15 1452    Tilden Fossa, MD 09/07/15 [phone]

## 2015-09-06 NOTE — Discharge Instructions (Signed)
Your symptoms is likely due to worsening asthma exacerbation due to an upper respiratory infection.  Please take steroid burst/taper course, take tessalon for cough and Zpak to treat for potential pneumonia.  Followup with your doctor for further care.

## 2015-09-11 ENCOUNTER — Ambulatory Visit (INDEPENDENT_AMBULATORY_CARE_PROVIDER_SITE_OTHER): Payer: BLUE CROSS/BLUE SHIELD | Admitting: Internal Medicine

## 2015-09-11 VITALS — BP 148/96 | HR 68 | Temp 98.2°F | Ht 66.0 in | Wt 311.5 lb

## 2015-09-11 DIAGNOSIS — J4541 Moderate persistent asthma with (acute) exacerbation: Secondary | ICD-10-CM

## 2015-09-11 DIAGNOSIS — I10 Essential (primary) hypertension: Secondary | ICD-10-CM | POA: Diagnosis not present

## 2015-09-11 DIAGNOSIS — J4521 Mild intermittent asthma with (acute) exacerbation: Secondary | ICD-10-CM

## 2015-09-11 MED ORDER — LISINOPRIL 5 MG PO TABS: 5.0000 mg | ORAL_TABLET | Freq: Every day | ORAL | 2 refills | Status: DC

## 2015-09-11 MED ORDER — ALBUTEROL SULFATE (2.5 MG/3ML) 0.083% IN NEBU
2.5000 mg | INHALATION_SOLUTION | Freq: Once | RESPIRATORY_TRACT | Status: AC
  Administered 2015-09-11: 2.5 mg via RESPIRATORY_TRACT

## 2015-09-11 MED ORDER — ALBUTEROL SULFATE HFA 108 (90 BASE) MCG/ACT IN AERS: 2.0000 | INHALATION_SPRAY | Freq: Four times a day (QID) | RESPIRATORY_TRACT | 3 refills | Status: DC | PRN

## 2015-09-11 MED ORDER — PREDNISONE 1 MG PO TABS: 10.0000 mg | ORAL_TABLET | Freq: Every day | ORAL | Status: AC

## 2015-09-11 NOTE — Progress Notes (Signed)
   CC: Asthma exacerbation follow-up HPI: [name] is a 35 y.o. female with a h/o of hypertension, asthma and gastroesophageal reflux disease who presents as a follow-up from an asthma exacerbation requiring treatment in the emergency department last Wednesday. She denies headache, chest pain, nausea, vomiting or abdominal pain. She also denies changes in bowel or bladder habits. She has no additional acute complaints or concerns at today's visit.  Please see problem-based charting for status of medical issues pertinent to this visit.     Review of Systems: A complete ROS was negative except as per HPI.  Physical Exam: Vitals:   09/11/15 1401  BP: (!) 148/96  Pulse: 68  Temp: 98.2 F (36.8 C)  TempSrc: Oral  SpO2: 99%  Weight: (!) 311 lb 8 oz (141.3 kg)  Height:  (1.676 m)   BP (!) 148/96 (BP Location: Left Arm, Patient Position: Sitting, Cuff Size: Large)   Pulse 68   Temp 98.2 F (36.8 C) (Oral)   Ht  (1.676 m)   Wt (!) 311 lb 8 oz (141.3 kg)   LMP 08/29/2015   SpO2 99% Comment: room air  BMI 50.28 kg/m  General appearance: alert, cooperative and Mildly distressed Lungs: expiratory wheezes in all lung fields bilaterally Heart: regular rate and rhythm, S1, S2 normal, no murmur, click, rub or gallop Abdomen: soft, non-tender; bowel sounds normal; no masses,  no organomegaly and no abdominal bruits auscultated Extremities: extremities normal, atraumatic, no cyanosis or edema  Assessment & Plan:  See encounters tab for problem based medical decision making. Patient seen with Dr. Oswaldo Done  Signed: Thomasene Lot, MD 09/11/2015, 2:58 PM  Pager: [phone]

## 2015-09-11 NOTE — Assessment & Plan Note (Signed)
Patient presents as follow-up from a recent asthma exacerbation. She continues to endorse daytime as well as nighttime cough and increased shortness of breath. She has currently run out of her albuterol inhaler. At the appointment today she was continuing to take her steroid taper as prescribed in the emergency department and finishing her antibiotics. On her physical examination she had expiratory wheezing in all lung fields bilaterally. She was treated with an albuterol nebulizer in the clinic which improved her symptoms and her lung examination. Because the patient continues to have daytime cough, nighttime cough and increased shortness of breath we will continue to treat as an asthma exacerbation and prolong her corticosteroid taper. -- Continue prednisone taper as follows 40 mg for 4 days, 30 mg for 4 days, 20 mg for 4 days, 10 mg for 4 days -- continue albuterol inhaler as needed for shortness of breath -- Return precautions given -- Return to clinic in 1 month for asthma and medication follow-up

## 2015-09-11 NOTE — Assessment & Plan Note (Signed)
Patient with a history of essential hypertension. She states that she was out of her lisinopril. I will refill her lisinopril and a stricture to take this as previously prescribed. She will need to return to clinic in 1 month for blood pressure follow-up. -- Lisinopril 5 mg once daily -- Return to clinic in 1 month for blood pressure follow-up

## 2015-09-11 NOTE — Patient Instructions (Addendum)
It was a pleasure seeing you today. Thank you for choosing [facility] for your healthcare needs.  -- use inhaler as needed -- For the steroids we will do a taper as follows, 4 pills for 4 days 3 pills for 4 days, 2 pills for 4 days, 1 pill for 4 days -- If symptoms do not improve or continue to worsen return to clinic or the emergency room as needed -- I will work on getting a nebulizer for your future treatments -- return to clinic in 1 month for asthma management

## 2015-09-12 NOTE — Progress Notes (Signed)
Internal Medicine Clinic Attending  I saw and evaluated the patient.  I personally confirmed the key portions of the history and exam documented by Dr. Ladona Ridgel and I reviewed pertinent patient test results.  The assessment, diagnosis, and plan were formulated together and I agree with the documentation in the resident's note.  Young woman here with persistent symptoms of asthma exacerbation despite systemic steroids for the last week. She has expiratory wheezing on exam. We ambulated her with pulse oximetry which had appropriate saturations, she maintained above 95% with exertion. We gave an albuterol neb treatment which she reports improving her symptoms. We decided to prolong the steroid taper for another week. We refilled albuterol HFA, she has a spacer. We started a DME order for a home nebulizer machine because she feels much improved with the neb delivery over the [facility] delivery. We advised close follow up if dyspnea worsens.

## 2015-10-09 ENCOUNTER — Encounter (HOSPITAL_COMMUNITY): Payer: Self-pay

## 2015-10-09 ENCOUNTER — Emergency Department (HOSPITAL_COMMUNITY)
Admission: EM | Admit: 2015-10-09 | Discharge: 2015-10-10 | Disposition: A | Discharge status: Home / Self Care | Payer: BLUE CROSS/BLUE SHIELD | Attending: Emergency Medicine | Admitting: Emergency Medicine

## 2015-10-09 DIAGNOSIS — I10 Essential (primary) hypertension: Secondary | ICD-10-CM | POA: Diagnosis not present

## 2015-10-09 DIAGNOSIS — J45909 Unspecified asthma, uncomplicated: Secondary | ICD-10-CM | POA: Diagnosis not present

## 2015-10-09 DIAGNOSIS — N898 Other specified noninflammatory disorders of vagina: Secondary | ICD-10-CM | POA: Insufficient documentation

## 2015-10-09 DIAGNOSIS — D649 Anemia, unspecified: Secondary | ICD-10-CM | POA: Diagnosis not present

## 2015-10-09 DIAGNOSIS — Z79899 Other long term (current) drug therapy: Secondary | ICD-10-CM | POA: Insufficient documentation

## 2015-10-09 DIAGNOSIS — N939 Abnormal uterine and vaginal bleeding, unspecified: Secondary | ICD-10-CM | POA: Diagnosis not present

## 2015-10-09 DIAGNOSIS — F17210 Nicotine dependence, cigarettes, uncomplicated: Secondary | ICD-10-CM | POA: Insufficient documentation

## 2015-10-09 LAB — CBC
HEMATOCRIT: 32.9 % — AB (ref 36.0–46.0)
Hemoglobin: 9.4 g/dL — ABNORMAL LOW (ref 12.0–15.0)
MCH: 19.9 pg — ABNORMAL LOW (ref 26.0–34.0)
MCHC: 28.6 g/dL — AB (ref 30.0–36.0)
MCV: 69.7 fL — AB (ref 78.0–100.0)
PLATELETS: 356 10*3/uL (ref 150–400)
RBC: 4.72 MIL/uL (ref 3.87–5.11)
RDW: 19.4 % — AB (ref 11.5–15.5)
WBC: 10.3 10*3/uL (ref 4.0–10.5)

## 2015-10-09 LAB — I-STAT BETA HCG BLOOD, ED (MC, WL, AP ONLY): I-stat hCG, quantitative: <5 m[IU]/mL (ref <5)

## 2015-10-09 NOTE — ED Triage Notes (Signed)
Pt states increased vaginal bleeding since Friday. Pt states passing half-dollar sized clots. Pt states ~ 3 pads/hour.

## 2015-10-09 NOTE — ED Provider Notes (Signed)
[facility] Provider Note   CSN: [id number] Arrival date & time: 10/09/15  1932     History   Chief Complaint Chief Complaint  Patient presents with  . Vaginal Bleeding    HPI Victoria Booth is a 35 y.o. female.  35 year old female with past medical history including hypertension, asthma, anemia who presents with vaginal bleeding. She states that for the past 3 days she has had heavy vaginal bleeding with passage of large clots and she has had a change clothes several times because the bleeding is so heavy. This is much heavier than her usual period. She does admit to irregular periods. She endorses associated intermittent, crampy and occasionally sharp lower abdominal pain that was initially left lower quadrant, sometimes right lower quadrant, and sometimes suprapubic. No fevers, urinary symptoms, vomiting, or diarrhea. The patient has never been sexually active.   The history is provided by the patient.  Vaginal Bleeding  Primary symptoms include vaginal bleeding.    Past Medical History:  Diagnosis Date  . Anemia   . Asthma   . Hypertension   . Obesity     Patient Active Problem List   Diagnosis Date Noted  . Acute otitis media 02/01/2015  . Asthma with acute exacerbation 02/01/2015  . Essential hypertension, benign 12/01/2014  . Asthma, chronic 12/01/2014  . Microcytic anemia 12/01/2014  . GERD (gastroesophageal reflux disease) 12/01/2014  . Constipation 12/01/2014  . Irregular menstrual cycle 12/01/2014  . Preventative health care 12/01/2014    Past Surgical History:  Procedure Laterality Date  . TOENAIL EXCISION      OB History    No data available       Home Medications    Prior to Admission medications   Medication Sig Start Date End Date Taking? Authorizing Provider  albuterol (PROVENTIL HFA;VENTOLIN HFA) 108 (90 Base) MCG/ACT inhaler Inhale 2 puffs into the lungs every 6 (six) hours as needed for wheezing or shortness of breath. 09/11/15    Thomasene LotJames Taylor, MD  azithromycin (ZITHROMAX Z-PAK) 250 MG tablet 2 po day one, then 1 daily x 4 days 09/06/15   Fayrene HelperBowie Tran, PA-C  benzonatate (TESSALON) 100 MG capsule Take 1 capsule (100 mg total) by mouth every 8 (eight) hours. 09/06/15   Fayrene HelperBowie Tran, PA-C  ferrous sulfate 325 (65 FE) MG tablet Take 1 tablet (325 mg total) by mouth daily. 01/23/15 01/23/16  Gwynn BurlyAndrew Wallace, DO  lisinopril (PRINIVIL,ZESTRIL) 5 MG tablet Take 1 tablet (5 mg total) by mouth daily. 09/11/15 09/10/16  Thomasene LotJames Taylor, MD  predniSONE (DELTASONE) 20 MG tablet Take 2 tablets (40 mg total) by mouth daily with breakfast. 40mg  daily- 4 days, 30mg  daily- 3 days, 20mg  daily- 2 days, then 10mg  -1 day. 09/06/15   Fayrene HelperBowie Tran, PA-C    Family History Family History  Problem Relation Age of Onset  . Diabetes Mother   . Hypertension Mother     Social History Social History  Substance Use Topics  . Smoking status: Light Tobacco Smoker    Packs/day: 0.10    Types: Cigarettes  . Smokeless tobacco: Never Used     Comment: 1 cig / month.  . Alcohol use No     Allergies   Penicillins; Shellfish allergy; Sulfa antibiotics; and Iodine   Review of Systems Review of Systems  Genitourinary: Positive for vaginal bleeding.     Physical Exam Updated Vital Signs BP 126/79   Pulse 71   Temp 98.2 F (36.8 C)   Resp 18  LMP 10/09/2015 (Exact Date)   SpO2 100%   Physical Exam  Constitutional: She is oriented to person, place, and time. She appears well-developed and well-nourished. No distress.  HENT:  Head: Normocephalic and atraumatic.  Moist mucous membranes  Eyes: Conjunctivae are normal. Pupils are equal, round, and reactive to light.  Neck: Neck supple.  Cardiovascular: Normal rate, regular rhythm and normal heart sounds.   No murmur heard. Pulmonary/Chest: Effort normal and breath sounds normal.  Abdominal: Soft. Bowel sounds are normal. She exhibits no distension. There is tenderness. There is no rebound and no  guarding.  Mild LLQ, suprapubic TTP  Genitourinary:  Genitourinary Comments: Moderate amount of blood in vaginal vault with 1 clot, unable to visualize cervix due to body habitus  Musculoskeletal: She exhibits no edema.  Neurological: She is alert and oriented to person, place, and time.  Fluent speech  Skin: Skin is warm and dry.  Psychiatric: She has a normal mood and affect. Judgment normal.  Nursing note and vitals reviewed. Chaperone was present during exam.    ED Treatments / Results  Labs (all labs ordered are listed, but only abnormal results are displayed) Labs Reviewed  CBC - Abnormal; Notable for the following:       Result Value   Hemoglobin 9.4 (*)    HCT 32.9 (*)    MCV 69.7 (*)    MCH 19.9 (*)    MCHC 28.6 (*)    RDW 19.4 (*)    All other components within normal limits  I-STAT BETA HCG BLOOD, ED ([facility], [facility], [facility] ONLY)  GC/CHLAMYDIA PROBE AMP ([place]) NOT AT [facility]    EKG  EKG Interpretation None       Radiology No results found.  Procedures Procedures (including critical care time)  Medications Ordered in ED Medications - No data to display   Initial Impression / Assessment and Plan / ED Course  I have reviewed the triage vital signs and the nursing notes.  Pertinent labs that were available during my care of the patient were reviewed by me and considered in my medical decision making (see chart for details).  Clinical Course   Patient presents with several days of heavy vaginal bleeding and passage of clots. She was well-appearing, ambulatory, and in no acute distress. Vital signs unremarkable with normal BP and heart rate. Moderate amount of vaginal bleeding on exam, otherwise reassuring.  Labs show negative pregnancy test and hemoglobin 9.4; the patient's usual hbg is 9-10 range as she has chronic anemia. Given her normal vital signs, stable blood count, and the fact that she is ambulatory without difficulty, I feel she is safe for discharge  with close f/u. She has appt with her internal med physician in the morning and I have instructed her to f/u for OCPs and referral to OB/GYN as needed. I have emphasized the importance of iron supplementation ; pt admits she does not take it currently. Extensively reviewed return precautions including severe lightheadedness, shortness of breath, or significant abdominal pain. Patient voiced understanding and was discharged in satisfactory condition. Final Clinical Impressions(s) / ED Diagnoses   Final diagnoses:  None    New Prescriptions New Prescriptions   No medications on file     Laurence Spates, MD 10/10/15 [phone]

## 2015-10-10 ENCOUNTER — Encounter: Payer: Self-pay | Admitting: Internal Medicine

## 2015-10-10 ENCOUNTER — Ambulatory Visit (INDEPENDENT_AMBULATORY_CARE_PROVIDER_SITE_OTHER): Payer: BLUE CROSS/BLUE SHIELD | Admitting: Internal Medicine

## 2015-10-10 VITALS — BP 168/103 | HR 79 | Temp 98.1°F | Wt 319.0 lb

## 2015-10-10 DIAGNOSIS — N926 Irregular menstruation, unspecified: Secondary | ICD-10-CM

## 2015-10-10 DIAGNOSIS — Z79899 Other long term (current) drug therapy: Secondary | ICD-10-CM

## 2015-10-10 DIAGNOSIS — D509 Iron deficiency anemia, unspecified: Secondary | ICD-10-CM | POA: Diagnosis not present

## 2015-10-10 DIAGNOSIS — I10 Essential (primary) hypertension: Secondary | ICD-10-CM | POA: Diagnosis not present

## 2015-10-10 DIAGNOSIS — N938 Other specified abnormal uterine and vaginal bleeding: Secondary | ICD-10-CM

## 2015-10-10 LAB — GC/CHLAMYDIA PROBE AMP (~~LOC~~) NOT AT ARMC
CHLAMYDIA, DNA PROBE: NEGATIVE
NEISSERIA GONORRHEA: NEGATIVE

## 2015-10-10 MED ORDER — FERROUS SULFATE 325 (65 FE) MG PO TABS: 325.0000 mg | ORAL_TABLET | Freq: Every day | ORAL | 3 refills | Status: DC

## 2015-10-10 MED ORDER — NAPROXEN 500 MG PO TABS: 500.0000 mg | ORAL_TABLET | Freq: Two times a day (BID) | ORAL | 0 refills | Status: DC

## 2015-10-10 NOTE — Assessment & Plan Note (Signed)
Advised her to restart her iron supplementation and to start taking Metamucil to help with constipation. She agreed.

## 2015-10-10 NOTE — Assessment & Plan Note (Signed)
She has a hx of DUB and had some testing for PCOS last year (testosterone normal) as well as prolactin and TSH which were normal. Given she has never had imaging of her uterus/ovaries and has a hx of irregular menstrual cycles I do not think it is unreasonable to obtain a transvaginal and pelvic ultrasound. In the meantime, will have her take Naproxen 500 mg BID to help control her bleeding and abdominal pain. I advised her to follow up with her OBGYN as well. Will continue to monitor and work up her DUB.

## 2015-10-10 NOTE — Progress Notes (Signed)
   CC: Painful and heavy menstrual bleeding   HPI:  [name] is a 35 y.o. woman with PMHx as noted below who presents today for painful and heavy menstrual bleeding.  Painful Menorrhagia: She reports heavy menstrual bleeding that started 5 days ago. She describes the bleeding as "running out of me like water" and she went through a 24 pack of pads in the last 2 days. She notes she had several large quarter-size clots in her pad. She notes her last period was 2 weeks ago and was normal. She reports her normal menstrual periods typically last 3 days and she has minimal to moderate bleeding. She notes a history of irregular periods, but never as heavy of bleeding as this episode. She describes an associated left sided abdominal pain that started 2 days after her menstrual bleeding started. She describes the pain as stabbing, throbbing, and pulsating in nature and constant. She notes the pain is worse when she lies down. She reports some nausea, but denies fevers, chills, and vomiting. She reports she went to the ED yesterday because she was concerned about how much she was bleeding and the abdominal pain. A urine pregnancy was negative and Hgb stable at 9.4 (BL 9-10). GC/chlamydia is still pending. She reports she is sexually active with her fiance, no new sexual partners in last 3 years. She states she has never had penetrative intercourse, but does engage in oral intercourse. Denies any hx of STDs. She has tried taking Aleve and Tylenol without much relief of the pain.   HTN: BP elevated today at 168/103. However, patient is in pain as noted above. She has been taking her Lisinopril 5 mg daily.   Iron Deficiency Anemia: Hgb 9.4 yesterday (was checked in ED). This is stable for her. She has not been taking her iron supplements since they cause constipation.   Past Medical History:  Diagnosis Date  . Anemia   . Asthma   . Hypertension   . Obesity     Review of Systems:  All negative  except per HPI  Physical Exam:  Vitals:   10/10/15 1547  BP: (!) 168/103  Pulse: 79  Temp: 98.1 F (36.7 C)  TempSrc: Oral  SpO2: 100%  Weight: (!) 319 lb (144.7 kg)   General: young woman sitting up, NAD, pleasant HEENT: [place]/AT, EOMI, pharynx non-erythematous, mucus membranes moist CV: RRR, no m/g/r Pulm: CTA bilaterally, breaths non-labored  Abd: BS+, soft, obese, minimal tenderness in LLQ, no rebound or guarding Ext: warm, no edema Neuro: alert and oriented x 3  Assessment & Plan:   See Encounters Tab for problem based charting.  Patient discussed with Dr. Rogelia BogaButcher

## 2015-10-10 NOTE — Assessment & Plan Note (Signed)
BP elevated today but patient was in pain. Will have her follow up in 1 month to reassess her BP. Continue Lisinopril 5 mg daily for now.

## 2015-10-10 NOTE — Patient Instructions (Signed)
General Instructions: - Start Naproxen 500 mg twice daily for your heavy menstrual periods. This should also help with the pain. - You will be contacted to schedule a transvaginal and pelvic ultrasound  - Restart iron once daily. Can take over the counter Colace or Metamucil to help with constipation.  Please bring your medicines with you each time you come to clinic.  Medicines may include prescription medications, over-the-counter medications, herbal remedies, eye drops, vitamins, or other pills.   Progress Toward Treatment Goals:  No flowsheet data found.  Self Care Goals & Plans:  Self Care Goal 10/10/2015  Manage my medications take my medicines as prescribed; bring my medications to every visit; refill my medications on time  Monitor my health keep track of my weight; keep track of my blood pressure  Eat healthy foods eat baked foods instead of fried foods; eat foods that are low in salt; drink diet soda or water instead of juice or soda  Be physically active find an activity I enjoy; take the stairs instead of the elevator  Stop smoking -  Meeting treatment goals -    No flowsheet data found.   Care Management & Community Referrals:  No flowsheet data found.

## 2015-10-11 NOTE — Progress Notes (Signed)
Internal Medicine Clinic Attending  Case discussed with Dr. Rivet soon after the resident saw the patient.  We reviewed the resident's history and exam and pertinent patient test results.  I agree with the assessment, diagnosis, and plan of care documented in the resident's note.  

## 2015-10-12 ENCOUNTER — Telehealth: Payer: Self-pay

## 2015-10-12 NOTE — Telephone Encounter (Signed)
Message sent to her doctor. 

## 2015-10-12 NOTE — Telephone Encounter (Signed)
Requesting lab result. Please call pt back.  

## 2015-10-13 NOTE — Telephone Encounter (Signed)
Left message that all labs were normal.

## 2015-10-20 ENCOUNTER — Ambulatory Visit (HOSPITAL_COMMUNITY): Payer: BLUE CROSS/BLUE SHIELD

## 2015-11-06 ENCOUNTER — Telehealth: Payer: Self-pay | Admitting: Internal Medicine

## 2015-11-06 NOTE — Telephone Encounter (Signed)
APT. REMINDER CALL, LMTCB °

## 2015-11-07 ENCOUNTER — Encounter: Payer: Self-pay | Admitting: Internal Medicine

## 2015-11-07 ENCOUNTER — Ambulatory Visit: Payer: BLUE CROSS/BLUE SHIELD

## 2016-01-07 ENCOUNTER — Ambulatory Visit (HOSPITAL_COMMUNITY)
Admission: EM | Admit: 2016-01-07 | Discharge: 2016-01-07 | Disposition: A | Discharge status: Home / Self Care | Payer: BLUE CROSS/BLUE SHIELD | Attending: Family Medicine | Admitting: Family Medicine

## 2016-01-07 ENCOUNTER — Encounter (HOSPITAL_COMMUNITY): Payer: Self-pay

## 2016-01-07 DIAGNOSIS — J40 Bronchitis, not specified as acute or chronic: Secondary | ICD-10-CM

## 2016-01-07 MED ORDER — HYDROCODONE-HOMATROPINE 5-1.5 MG/5ML PO SYRP: 5.0000 mL | ORAL_SOLUTION | Freq: Four times a day (QID) | ORAL | 0 refills | Status: DC | PRN

## 2016-01-07 MED ORDER — AZITHROMYCIN 250 MG PO TABS: 250.0000 mg | ORAL_TABLET | Freq: Every day | ORAL | 0 refills | Status: DC

## 2016-01-07 NOTE — Discharge Instructions (Signed)
Use Afrin nasal spray, also called oxymetazoline, to the left nostril every night for the next 3 nights.

## 2016-01-07 NOTE — ED Provider Notes (Signed)
[facility]    CSN: [id number] Arrival date & time: 01/07/16  1230     History   Chief Complaint Chief Complaint  Patient presents with  . URI    HPI Victoria Booth is a 35 y.o. female.   Pt said she has been sick for two weeks. Sore throat, cough, nasal congestion, nose bleed out of the left side, worse at night. No fever. Has been taking: robitussin, alka seltzer cold, theraflu and dayquil.  Patient had been on a cruise down to the [place] when she got sick. She was unable to enjoy her vacation because of the sore throat and cough. Now she is coughing to the point of throwing up.      Past Medical History:  Diagnosis Date  . Anemia   . Asthma   . Hypertension   . Obesity     Patient Active Problem List   Diagnosis Date Noted  . Asthma with acute exacerbation 02/01/2015  . Essential hypertension, benign 12/01/2014  . Asthma, chronic 12/01/2014  . Microcytic anemia 12/01/2014  . GERD (gastroesophageal reflux disease) 12/01/2014  . Constipation 12/01/2014  . Dysfunctional uterine bleeding 12/01/2014  . Preventative health care 12/01/2014    Past Surgical History:  Procedure Laterality Date  . TOENAIL EXCISION      OB History    No data available       Home Medications    Prior to Admission medications   Medication Sig Start Date End Date Taking? Authorizing Provider  albuterol (PROVENTIL HFA;VENTOLIN HFA) 108 (90 Base) MCG/ACT inhaler Inhale 2 puffs into the lungs every 6 (six) hours as needed for wheezing or shortness of breath. 09/11/15  Yes Thomasene Lot, MD  ferrous sulfate 325 (65 FE) MG tablet Take 1 tablet (325 mg total) by mouth daily. 10/10/15 10/09/16 Yes Carly J Rivet, MD  lisinopril (PRINIVIL,ZESTRIL) 5 MG tablet Take 1 tablet (5 mg total) by mouth daily. 09/11/15 09/10/16 Yes Thomasene Lot, MD  azithromycin (ZITHROMAX) 250 MG tablet Take 1 tablet (250 mg total) by mouth daily. Take first 2 tablets together, then 1 every day until  finished. 01/07/16   Elvina Sidle, MD  HYDROcodone-homatropine [facility]) 5-1.5 MG/5ML syrup Take 5 mLs by mouth every 6 (six) hours as needed for cough. 01/07/16   Elvina Sidle, MD  naproxen (NAPROSYN) 500 MG tablet Take 1 tablet (500 mg total) by mouth 2 (two) times daily with a meal. 10/10/15 10/09/16  Su Hoff, MD    Family History Family History  Problem Relation Age of Onset  . Diabetes Mother   . Hypertension Mother     Social History Social History  Substance Use Topics  . Smoking status: Former Smoker    Packs/day: 0.10    Types: Cigarettes  . Smokeless tobacco: Never Used     Comment: 1 cig / month.  . Alcohol use No     Allergies   Penicillins; Shellfish allergy; Sulfa antibiotics; and Iodine   Review of Systems Review of Systems  Constitutional: Negative for fever.  HENT: Positive for congestion, nosebleeds, sinus pressure and sore throat.   Eyes: Negative.   Respiratory: Positive for cough.   Cardiovascular: Negative.   Gastrointestinal: Negative.   Musculoskeletal: Negative.   Psychiatric/Behavioral: Negative.      Physical Exam Triage Vital Signs ED Triage Vitals  Enc Vitals Group     BP 01/07/16 1350 156/80     Pulse Rate 01/07/16 1350 85     Resp 01/07/16 1350  14     Temp 01/07/16 1350 98.4 F (36.9 C)     Temp Source 01/07/16 1350 Oral     SpO2 01/07/16 1350 100 %     Weight --      Height --      Head Circumference --      Peak Flow --      Pain Score 01/07/16 1407 7     Pain Loc --      Pain Edu? --      Excl. in GC? --    No data found.   Updated Vital Signs BP 156/80 (BP Location: Left Arm)   Pulse 85   Temp 98.4 F (36.9 C) (Oral)   Resp 14   LMP 12/29/2015 (Exact Date)   SpO2 100%       Physical Exam  Constitutional: She is oriented to person, place, and time. She appears well-developed and well-nourished.  HENT:  Head: Normocephalic.  Right Ear: External ear normal.  Left Ear: External ear normal.    Mouth/Throat: Oropharynx is clear and moist.  Left nostril is reddened. There is no active bleeding seen  Eyes: Conjunctivae and EOM are normal.  Neck: Normal range of motion.  Cardiovascular: Normal rate, regular rhythm and normal heart sounds.   Pulmonary/Chest: Effort normal and breath sounds normal.  Abdominal: Soft.  Musculoskeletal: Normal range of motion.  Neurological: She is alert and oriented to person, place, and time.  Skin: Skin is warm and dry.  Nursing note and vitals reviewed.    UC Treatments / Results  Labs (all labs ordered are listed, but only abnormal results are displayed) Labs Reviewed - No data to display  EKG  EKG Interpretation None       Radiology No results found.  Procedures Procedures (including critical care time)  Medications Ordered in UC Medications - No data to display   Initial Impression / Assessment and Plan / UC Course  I have reviewed the triage vital signs and the nursing notes.  Pertinent labs & imaging results that were available during my care of the patient were reviewed by me and considered in my medical decision making (see chart for details).  Clinical Course     Final Clinical Impressions(s) / UC Diagnoses   Final diagnoses:  Bronchitis    New Prescriptions New Prescriptions   AZITHROMYCIN (ZITHROMAX) 250 MG TABLET    Take 1 tablet (250 mg total) by mouth daily. Take first 2 tablets together, then 1 every day until finished.   HYDROCODONE-HOMATROPINE (HYCODAN) 5-1.5 MG/5ML SYRUP    Take 5 mLs by mouth every 6 (six) hours as needed for cough.     Elvina SidleKurt Marc Sivertsen, MD 01/07/16 1436

## 2016-01-07 NOTE — ED Triage Notes (Signed)
Pt said she has been sick for two weeks. Sore throat, cough, nasal congestion, nose bleed out of the left side, worse at night. No fever. Has been taking: robatussin, alka seltzer cold, theraflu and dayquil

## 2016-01-30 ENCOUNTER — Ambulatory Visit (HOSPITAL_COMMUNITY)
Admission: EM | Admit: 2016-01-30 | Discharge: 2016-01-30 | Disposition: A | Discharge status: Home / Self Care | Payer: BLUE CROSS/BLUE SHIELD | Attending: Family Medicine | Admitting: Family Medicine

## 2016-01-30 ENCOUNTER — Encounter (HOSPITAL_COMMUNITY): Payer: Self-pay | Admitting: Family Medicine

## 2016-01-30 DIAGNOSIS — M5440 Lumbago with sciatica, unspecified side: Secondary | ICD-10-CM

## 2016-01-30 MED ORDER — KETOROLAC TROMETHAMINE 60 MG/2ML IM SOLN
INTRAMUSCULAR | Status: AC
  Filled 2016-01-30: qty 2

## 2016-01-30 MED ORDER — NAPROXEN 500 MG PO TABS: 500.0000 mg | ORAL_TABLET | Freq: Two times a day (BID) | ORAL | 0 refills | Status: DC

## 2016-01-30 MED ORDER — CYCLOBENZAPRINE HCL 5 MG PO TABS: ORAL_TABLET | ORAL | 0 refills | Status: DC

## 2016-01-30 MED ORDER — KETOROLAC TROMETHAMINE 60 MG/2ML IM SOLN
60.0000 mg | Freq: Once | INTRAMUSCULAR | Status: AC
  Administered 2016-01-30: 60 mg via INTRAMUSCULAR

## 2016-01-30 NOTE — ED Triage Notes (Signed)
Pt here with mid/lower back pain since falling backwards on Saturday. sts the pain has gotten worse despite heat packs and OTC meds.

## 2016-01-30 NOTE — ED Provider Notes (Signed)
CSN: [id number]     Arrival date & time 01/30/16  1832 History   None    Chief Complaint  Patient presents with  . Back Pain   (Consider location/radiation/quality/duration/timing/severity/associated sxs/prior Treatment) Patient c/o lower back pain after fall and she is having moderate to severe pain.   The history is provided by the patient.  Back Pain  Location:  Lumbar spine Quality:  Aching Radiates to:  Does not radiate Pain severity:  Moderate Onset quality:  Sudden Duration:  3 days Timing:  Constant Progression:  Worsening Chronicity:  New Relieved by:  Nothing Worsened by:  Nothing Ineffective treatments:  NSAIDs   Past Medical History:  Diagnosis Date  . Anemia   . Asthma   . Hypertension   . Obesity    Past Surgical History:  Procedure Laterality Date  . TOENAIL EXCISION     Family History  Problem Relation Age of Onset  . Diabetes Mother   . Hypertension Mother    Social History  Substance Use Topics  . Smoking status: Former Smoker    Packs/day: 0.10    Types: Cigarettes  . Smokeless tobacco: Never Used     Comment: 1 cig / month.  . Alcohol use No   OB History    No data available     Review of Systems  Constitutional: Negative.   HENT: Negative.   Eyes: Negative.   Respiratory: Negative.   Cardiovascular: Negative.   Gastrointestinal: Negative.   Endocrine: Negative.   Genitourinary: Negative.   Musculoskeletal: Positive for back pain.  Skin: Negative.   Allergic/Immunologic: Negative.   Neurological: Negative.   Psychiatric/Behavioral: Negative.     Allergies  Penicillins; Shellfish allergy; Sulfa antibiotics; and Iodine  Home Medications   Prior to Admission medications   Medication Sig Start Date End Date Taking? Authorizing Provider  albuterol (PROVENTIL HFA;VENTOLIN HFA) 108 (90 Base) MCG/ACT inhaler Inhale 2 puffs into the lungs every 6 (six) hours as needed for wheezing or shortness of breath. 09/11/15   Thomasene Lot, MD  azithromycin (ZITHROMAX) 250 MG tablet Take 1 tablet (250 mg total) by mouth daily. Take first 2 tablets together, then 1 every day until finished. 01/07/16   Elvina Sidle, MD  cyclobenzaprine (FLEXERIL) 5 MG tablet Take 1-2 po qhs prn muscle spasm/ pain 01/30/16   Deatra Canter, FNP  ferrous sulfate 325 (65 FE) MG tablet Take 1 tablet (325 mg total) by mouth daily. 10/10/15 10/09/16  Su Hoff, MD  HYDROcodone-homatropine (HYCODAN) 5-1.5 MG/5ML syrup Take 5 mLs by mouth every 6 (six) hours as needed for cough. 01/07/16   Elvina Sidle, MD  lisinopril (PRINIVIL,ZESTRIL) 5 MG tablet Take 1 tablet (5 mg total) by mouth daily. 09/11/15 09/10/16  Thomasene Lot, MD  naproxen (NAPROSYN) 500 MG tablet Take 1 tablet (500 mg total) by mouth 2 (two) times daily with a meal. 10/10/15 10/09/16  Su Hoff, MD  naproxen (NAPROSYN) 500 MG tablet Take 1 tablet (500 mg total) by mouth 2 (two) times daily with a meal. 01/30/16   Deatra Canter, FNP   Meds Ordered and Administered this Visit   Medications  ketorolac (TORADOL) injection 60 mg (not administered)    BP 157/96   Pulse 68   Temp 98.2 F (36.8 C)   Resp 18   LMP 01/13/2016   SpO2 100%  No data found.   Physical Exam  Constitutional: She appears well-developed and well-nourished.  HENT:  Head: Normocephalic and atraumatic.  Eyes: Conjunctivae and EOM are normal. Pupils are equal, round, and reactive to light.  Neck: Normal range of motion. Neck supple.  Cardiovascular: Normal rate, regular rhythm and normal heart sounds.   Pulmonary/Chest: Effort normal and breath sounds normal.  Musculoskeletal: She exhibits tenderness.  TTP LS spine  Skin: Skin is warm and dry.  Nursing note and vitals reviewed.   Urgent Care Course   Clinical Course     Procedures (including critical care time)  Labs Review Labs Reviewed - No data to display  Imaging Review No results found.   Visual Acuity Review  Right Eye  Distance:   Left Eye Distance:   Bilateral Distance:    Right Eye Near:   Left Eye Near:    Bilateral Near:         MDM   1. Acute bilateral low back pain with sciatica, sciatica laterality unspecified    Flexeril 5mg  one to two po qhs prn #14 Naprosyn 500mg  one po bid x 10 days #20     Deatra CanterWilliam J Kol Consuegra, FNP 01/30/16 1956

## 2016-04-04 ENCOUNTER — Emergency Department (HOSPITAL_COMMUNITY)
Admission: EM | Admit: 2016-04-04 | Discharge: 2016-04-04 | Disposition: A | Discharge status: Home / Self Care | Payer: BLUE CROSS/BLUE SHIELD | Attending: Emergency Medicine | Admitting: Emergency Medicine

## 2016-04-04 ENCOUNTER — Emergency Department (HOSPITAL_COMMUNITY): Payer: BLUE CROSS/BLUE SHIELD

## 2016-04-04 ENCOUNTER — Encounter (HOSPITAL_COMMUNITY): Payer: Self-pay

## 2016-04-04 DIAGNOSIS — R519 Headache, unspecified: Secondary | ICD-10-CM

## 2016-04-04 DIAGNOSIS — Z7982 Long term (current) use of aspirin: Secondary | ICD-10-CM | POA: Diagnosis not present

## 2016-04-04 DIAGNOSIS — J45909 Unspecified asthma, uncomplicated: Secondary | ICD-10-CM | POA: Diagnosis not present

## 2016-04-04 DIAGNOSIS — I10 Essential (primary) hypertension: Secondary | ICD-10-CM

## 2016-04-04 DIAGNOSIS — R51 Headache: Secondary | ICD-10-CM | POA: Diagnosis present

## 2016-04-04 DIAGNOSIS — Z87891 Personal history of nicotine dependence: Secondary | ICD-10-CM | POA: Insufficient documentation

## 2016-04-04 MED ORDER — SALINE SPRAY 0.65 % NA SOLN
1.0000 | Freq: Once | NASAL | Status: AC
  Administered 2016-04-04: 1 via NASAL
  Filled 2016-04-04 (×2): qty 44

## 2016-04-04 MED ORDER — ONDANSETRON 4 MG PO TBDP
4.0000 mg | ORAL_TABLET | Freq: Three times a day (TID) | ORAL | 0 refills | Status: DC | PRN
Start: 2016-04-04 — End: 2016-04-05

## 2016-04-04 MED ORDER — SODIUM CHLORIDE 0.9 % IV BOLUS (SEPSIS)
1000.0000 mL | Freq: Once | INTRAVENOUS | Status: AC
  Administered 2016-04-04: 1000 mL via INTRAVENOUS

## 2016-04-04 MED ORDER — MORPHINE SULFATE (PF) 4 MG/ML IV SOLN
4.0000 mg | Freq: Once | INTRAVENOUS | Status: AC
  Administered 2016-04-04: 4 mg via INTRAVENOUS
  Filled 2016-04-04: qty 1

## 2016-04-04 MED ORDER — ONDANSETRON HCL 4 MG/2ML IJ SOLN
4.0000 mg | Freq: Once | INTRAMUSCULAR | Status: AC
  Administered 2016-04-04: 4 mg via INTRAVENOUS
  Filled 2016-04-04: qty 2

## 2016-04-04 NOTE — ED Notes (Signed)
ED Provider at bedside. 

## 2016-04-04 NOTE — ED Notes (Signed)
Patient transported to CT 

## 2016-04-04 NOTE — ED Triage Notes (Signed)
Patient arrived by EMS for headache and HTN that developed while at work today. Took BC and ibuprofen with some relief. On arrival reports that her headache iks resolving and BP improved

## 2016-04-04 NOTE — ED Provider Notes (Signed)
[facility]-EMERGENCY DEPT Provider Note   CSN: [id number] Arrival date & time: 04/04/16  1333   By signing my name below, I, Freida Busman, attest that this documentation has been prepared under the direction and in the presence of Jacalyn Lefevre, MD . Electronically Signed: Freida Busman, Scribe. 04/04/2016. 3:16 PM.  History   Chief Complaint Chief Complaint  Patient presents with  . Headache     The history is provided by the patient. No language interpreter was used.     HPI Comments:  Victoria Booth is a 36 y.o. female who presents to the Emergency Department complaining of a HA which began while at work today. She reports pulsating pain to the right frontal region as well as pain to the occipital region.  She has taken Ibuprofen and BC powder with mild relief. Pt reports associated blurry vision, an episode of epistaxis from the left nare that has resolved at this time and elevated BP.  She states her BP was 180 systolically. Pt is compliant with her meds. No recent changes in dose. Pt denies CP and SOB.   Past Medical History:  Diagnosis Date  . Anemia   . Asthma   . Hypertension   . Obesity     Patient Active Problem List   Diagnosis Date Noted  . Asthma with acute exacerbation 02/01/2015  . Essential hypertension, benign 12/01/2014  . Asthma, chronic 12/01/2014  . Microcytic anemia 12/01/2014  . GERD (gastroesophageal reflux disease) 12/01/2014  . Constipation 12/01/2014  . Dysfunctional uterine bleeding 12/01/2014  . Preventative health care 12/01/2014    Past Surgical History:  Procedure Laterality Date  . TOENAIL EXCISION      OB History    No data available       Home Medications    Prior to Admission medications   Medication Sig Start Date End Date Taking? Authorizing Provider  albuterol (PROVENTIL HFA;VENTOLIN HFA) 108 (90 Base) MCG/ACT inhaler Inhale 2 puffs into the lungs every 6 (six) hours as needed for wheezing or shortness of breath. 09/11/15   Yes Thomasene Lot, MD  Aspirin-Salicylamide-Caffeine (BC FAST PAIN RELIEF) [id number] MG PACK Take 1 Package by mouth daily as needed (PAIN).    Yes Historical Provider, MD  ibuprofen (ADVIL,MOTRIN) 200 MG tablet Take 200 mg by mouth every 6 (six) hours as needed for headache.    Yes Historical Provider, MD  lisinopril (PRINIVIL,ZESTRIL) 5 MG tablet Take 1 tablet (5 mg total) by mouth daily. 09/11/15 09/10/16 Yes Thomasene Lot, MD  HYDROcodone-homatropine [facility]) 5-1.5 MG/5ML syrup Take 5 mLs by mouth every 6 (six) hours as needed for cough. 01/07/16   Elvina Sidle, MD  ondansetron (ZOFRAN ODT) 4 MG disintegrating tablet Take 1 tablet (4 mg total) by mouth every 8 (eight) hours as needed. 04/04/16   Jacalyn Lefevre, MD    Family History Family History  Problem Relation Age of Onset  . Diabetes Mother   . Hypertension Mother     Social History Social History  Substance Use Topics  . Smoking status: Former Smoker    Packs/day: 0.10    Types: Cigarettes  . Smokeless tobacco: Never Used     Comment: 1 cig / month.  . Alcohol use No     Allergies   Penicillins; Shellfish allergy; Sulfa antibiotics; and Iodine   Review of Systems Review of Systems  Constitutional:       +HTN  HENT: Positive for nosebleeds.   Eyes: Positive for visual disturbance.  Neurological: Positive for  headaches. Negative for syncope, weakness and numbness.  All other systems reviewed and are negative.    Physical Exam Updated Vital Signs BP 158/91 (BP Location: Left Arm)   Pulse 67   Temp 98.3 F (36.8 C) (Oral)   Resp 20   SpO2 100%   Physical Exam  Constitutional: She is oriented to person, place, and time. She appears well-developed and well-nourished. No distress.  HENT:  Head: Normocephalic and atraumatic.  Swollen nasal turbinates   Eyes: Conjunctivae are normal.  Cardiovascular: Normal rate.   Pulmonary/Chest: Effort normal.  Abdominal: She exhibits no distension.  Neurological: She  is alert and oriented to person, place, and time.  Skin: Skin is warm and dry.  Psychiatric: She has a normal mood and affect.  Nursing note and vitals reviewed.    ED Treatments / Results  DIAGNOSTIC STUDIES:  Oxygen Saturation is 100% on RA, normal by my interpretation.    COORDINATION OF CARE:  3:11 PM Discussed treatment plan with pt at bedside and pt agreed to plan.  Labs (all labs ordered are listed, but only abnormal results are displayed) Labs Reviewed - No data to display  EKG  EKG Interpretation None       Radiology Ct Head Wo Contrast  Result Date: 04/04/2016 CLINICAL DATA:  Headache, hypertension and nose bleed today. EXAM: CT HEAD WITHOUT CONTRAST TECHNIQUE: Contiguous axial images were obtained from the base of the skull through the vertex without intravenous contrast. COMPARISON:  None. FINDINGS: Brain: No evidence of malformation, atrophy, old or acute small or large vessel infarction, mass lesion, hemorrhage, hydrocephalus or extra-axial collection. No evidence of pituitary lesion. Vascular: No vascular calcification.  No hyperdense vessels. Skull: Normal.  No fracture or focal bone lesion. Sinuses/Orbits: Visualized sinuses are clear. No fluid in the middle ears or mastoids. Visualized orbits are normal. Other: None significant Electronically Signed   By: Paulina Fusi M.D.   On: 04/04/2016 16:18    Procedures Procedures (including critical care time)  Medications Ordered in ED Medications  sodium chloride (OCEAN) 0.65 % nasal spray 1 spray (not administered)  ondansetron (ZOFRAN) injection 4 mg (4 mg Intravenous Given 04/04/16 1554)  morphine 4 MG/ML injection 4 mg (4 mg Intravenous Given 04/04/16 1554)  sodium chloride 0.9 % bolus 1,000 mL (1,000 mLs Intravenous New Bag/Given 04/04/16 1602)     Initial Impression / Assessment and Plan / ED Course  I have reviewed the triage vital signs and the nursing notes.  Pertinent labs & imaging results that were  available during my care of the patient were reviewed by me and considered in my medical decision making (see chart for details).    Pt is feeling much better.  Pt has no active epistaxis.  CT head nl.  She will be given saline spray prior to d/c.  She knows to continue her bp meds and to f/u with pcp.  Return if worse.  Final Clinical Impressions(s) / ED Diagnoses   Final diagnoses:  Essential hypertension  Acute nonintractable headache, unspecified headache type    New Prescriptions New Prescriptions   ONDANSETRON (ZOFRAN ODT) 4 MG DISINTEGRATING TABLET    Take 1 tablet (4 mg total) by mouth every 8 (eight) hours as needed.  I personally performed the services described in this documentation, which was scribed in my presence. The recorded information has been reviewed and is accurate.    Jacalyn Lefevre, MD 04/04/16 [phone]

## 2016-04-05 ENCOUNTER — Ambulatory Visit (INDEPENDENT_AMBULATORY_CARE_PROVIDER_SITE_OTHER): Payer: BLUE CROSS/BLUE SHIELD | Admitting: Internal Medicine

## 2016-04-05 ENCOUNTER — Encounter: Payer: BLUE CROSS/BLUE SHIELD | Admitting: Internal Medicine

## 2016-04-05 ENCOUNTER — Encounter: Payer: Self-pay | Admitting: Internal Medicine

## 2016-04-05 ENCOUNTER — Telehealth: Payer: Self-pay | Admitting: *Deleted

## 2016-04-05 VITALS — BP 148/87 | HR 76 | Temp 97.8°F | Ht 67.0 in | Wt 314.0 lb

## 2016-04-05 DIAGNOSIS — G44209 Tension-type headache, unspecified, not intractable: Secondary | ICD-10-CM | POA: Diagnosis not present

## 2016-04-05 DIAGNOSIS — I10 Essential (primary) hypertension: Secondary | ICD-10-CM

## 2016-04-05 DIAGNOSIS — Z7252 High risk homosexual behavior: Secondary | ICD-10-CM

## 2016-04-05 DIAGNOSIS — R519 Headache, unspecified: Secondary | ICD-10-CM | POA: Insufficient documentation

## 2016-04-05 DIAGNOSIS — E282 Polycystic ovarian syndrome: Secondary | ICD-10-CM | POA: Insufficient documentation

## 2016-04-05 LAB — GLUCOSE, CAPILLARY: GLUCOSE-CAPILLARY: 77 mg/dL (ref 65–99)

## 2016-04-05 LAB — POCT GLYCOSYLATED HEMOGLOBIN (HGB A1C): Hemoglobin A1C: 5.8

## 2016-04-05 MED ORDER — KETOROLAC TROMETHAMINE 30 MG/ML IJ SOLN
60.0000 mg | Freq: Once | INTRAMUSCULAR | Status: AC
  Administered 2016-04-05: 60 mg via INTRAMUSCULAR

## 2016-04-05 MED ORDER — NAPROXEN 500 MG PO TABS: 500.0000 mg | ORAL_TABLET | Freq: Two times a day (BID) | ORAL | 0 refills | Status: DC

## 2016-04-05 MED ORDER — SPIRONOLACTONE 50 MG PO TABS: 50.0000 mg | ORAL_TABLET | Freq: Every day | ORAL | 11 refills | Status: DC

## 2016-04-05 MED ORDER — ONDANSETRON 4 MG PO TBDP: 4.0000 mg | ORAL_TABLET | Freq: Three times a day (TID) | ORAL | 0 refills | Status: DC | PRN

## 2016-04-05 NOTE — Telephone Encounter (Signed)
Please see ED note of recent, pt states she has h/a x 3 days, worse h/a ever, she states she has not taken her bp med in awhile, "like last year" . appt today in dr johnson's 1315 slot, this was the time she chose .

## 2016-04-05 NOTE — Assessment & Plan Note (Addendum)
Patient was previously on spironolactone for possible PCOS. Earlier notes, state that once PCOS is confirmed we could restart spironolactone. On review of her chart, there is no imaging findings of cystic ovaries seen on abdominal ultrasound or CT abdomen in 2016 and 2015. However, patient has had a history of irregular menstrual periods, hirsutism with facial hair that she has to wax off.  Assessment: PCOS  Plan: -Start spironolactone 50 mg once a day -Patient is homosexual and not pregnant at this time; however she does desire future fertility with IVF -Patient is to follow-up with OB/GYN on Monday -I discussed the importance of discussing possible birth control in the interim given risk of unknown opposed estrogen in the setting of PCOS -Hemoglobin A1c obtained -patient is prediabetic

## 2016-04-05 NOTE — Assessment & Plan Note (Signed)
Mildly hypertensive at 148/87. Heart rate 76.  Plan: -Started spironolactone 50 mg once daily given concomitant PCOS

## 2016-04-05 NOTE — Assessment & Plan Note (Signed)
Patient was seen in the emergency department yesterday complaining of a right frontal headache radiating to the occipital region. She described as pulsating. She had tried ibuprofen and BC powder without relief. She noted associated blurry vision and increased blood pressure. She denied any shortness of breath or chest pain. In the emergency department, her blood pressure is 158/91. Pulse 67, temperature 98.3, respiratory rate 20, satting 100% on room air. She was given a nasal spray, Zofran, an injection of morphine and a 1 L normal saline bolus. She had a CT head which was normal. Patient has not been taking her blood pressure medications. She is supposed to be on lisinopril 5 mg daily. Today, patient continues to have a right frontal and left frontal headache which feels like tightness and throbbing. She denies associated photophobia or phonophobia. She does admit to nausea with 1 episode of vomiting.  Assessment: Tension headache  Plan: -Toradol 60 mg IM once -Naproxen 500 mg twice a day with meals when necessary -Tylenol 650 mg every 6 hours when necessary -Zofran 4 mg every 8 hours when necessary -Follow-up if worsening or not resolving

## 2016-04-05 NOTE — Progress Notes (Signed)
    CC: HTN  HPI: [name] is a 36 y.o. female with PMHx of HTN who presents to the clinic for follow-up for hypertension.  Patient was seen in the emergency department yesterday complaining of a right frontal headache radiating to the occipital region. She described as pulsating. She had tried ibuprofen and BC powder without relief. She noted associated blurry vision and increased blood pressure. She denied any shortness of breath or chest pain. In the emergency department, her blood pressure is 158/91. Pulse 67, temperature 98.3, respiratory rate 20, satting 100% on room air. She was given a nasal spray, Zofran, an injection of morphine and a 1 L normal saline bolus. She had a CT head which was normal. Patient has not been taking her blood pressure medications. She is supposed to be on lisinopril 5 mg daily. Today, patient continues to have a right frontal and left frontal headache which feels like tightness and throbbing. She denies associated photophobia or phonophobia. She does admit to nausea with 1 episode of vomiting.  Patient was previously on spironolactone for possible PCOS. Earlier notes, state that once PCOS is confirmed we could restart spironolactone. On review of her chart, there is no imaging findings of cystic ovaries seen on abdominal ultrasound or CT abdomen in 2016 and 2015. However, patient has had a history of irregular menstrual periods, hirsutism with facial hair that she has to wax off.  Past Medical History:  Diagnosis Date  . Anemia   . Asthma   . Hypertension   . Obesity     Review of Systems: Please see pertinent ROS reviewed in HPI and problem based charting.   Physical Exam: Vitals:   04/05/16 1332  BP: (!) 148/87  Pulse: 76  Temp: 97.8 F (36.6 C)  TempSrc: Oral  SpO2: 98%  Weight: (!) 314 lb (142.4 kg)  Height: 5\' 7"  (1.702 m)   General: Vital signs reviewed.  Patient is well-developed and well-nourished, in no acute distress and  cooperative with exam.  Head: Normocephalic and atraumatic. Eyes: PERRLA, conjunctivae normal, no scleral icterus.  Neck: Supple, trachea midline Cardiovascular: RRR, S1 normal, S2 normal, no murmurs, gallops, or rubs. Pulmonary/Chest: Clear to auscultation bilaterally, no wheezes, rales, or rhonchi. Abdominal: Soft, non-tender, non-distended, BS +, obese Extremities: No lower extremity edema bilaterally Neurological: Cranial nerve II-XII are grossly intact, no focal motor deficit, sensory intact to light touch bilaterally.  Skin: Warm, dry and intact. No rashes or erythema. Psychiatric: Normal mood and affect. speech and behavior is normal. Cognition and memory are normal.   Assessment & Plan:  See encounters tab for problem based medical decision making. Patient discussed with Dr. Rogelia Boga

## 2016-04-05 NOTE — Telephone Encounter (Signed)
Agree with appt. She no showed her Sept appt to F/U her BP

## 2016-04-05 NOTE — Patient Instructions (Signed)
For your headache: Today he received a shot of Toradol 60 mg once. You can take Tylenol 650 mg every 6 hours as needed for headache. Tomorrow, you can start taking naproxen 500 mg twice a day with food as needed for headache. I have also sent in Zofran 4 mg every 8 hours as needed for nausea.  For your polycystic ovarian syndrome and her high blood pressure: Please start taking spironolactone 50 mg once a day. This will help both with your hirsutism and high blood pressure. Please follow up with her OB/GYN on Monday. If you do have polycystic ovarian syndrome, it will be very important to discuss starting birth control to protect your uterus as polycystic ovarian syndrome causes unopposed estrogen secretion.  Please follow up with [place] in 2 weeks for a blood pressure recheck.

## 2016-04-06 LAB — BMP8+ANION GAP
ANION GAP: 18 mmol/L (ref 10.0–18.0)
BUN/Creatinine Ratio: 14 (ref 9–23)
BUN: 9 mg/dL (ref 6–20)
CO2: 21 mmol/L (ref 18–29)
CREATININE: 0.66 mg/dL (ref 0.57–1.00)
Calcium: 9.2 mg/dL (ref 8.7–10.2)
Chloride: 104 mmol/L (ref 96–106)
GFR calc Af Amer: 133 mL/min/{1.73_m2} (ref >59)
GFR, EST NON AFRICAN AMERICAN: 116 mL/min/{1.73_m2} (ref >59)
Glucose: 80 mg/dL (ref 65–99)
Potassium: 4.5 mmol/L (ref 3.5–5.2)
Sodium: 143 mmol/L (ref 134–144)

## 2016-04-08 ENCOUNTER — Encounter: Payer: Self-pay | Admitting: Internal Medicine

## 2016-04-08 DIAGNOSIS — E079 Disorder of thyroid, unspecified: Secondary | ICD-10-CM | POA: Insufficient documentation

## 2016-04-08 DIAGNOSIS — D649 Anemia, unspecified: Secondary | ICD-10-CM | POA: Insufficient documentation

## 2016-04-08 NOTE — Progress Notes (Signed)
Internal Medicine Clinic Attending  Case discussed with Dr. Burns soon after the resident saw the patient.  We reviewed the resident's history and exam and pertinent patient test results.  I agree with the assessment, diagnosis, and plan of care documented in the resident's note. 

## 2016-04-12 ENCOUNTER — Ambulatory Visit (INDEPENDENT_AMBULATORY_CARE_PROVIDER_SITE_OTHER): Payer: BLUE CROSS/BLUE SHIELD | Admitting: Internal Medicine

## 2016-04-12 VITALS — BP 162/100 | HR 93 | Temp 98.3°F | Wt 312.7 lb

## 2016-04-12 DIAGNOSIS — E669 Obesity, unspecified: Secondary | ICD-10-CM

## 2016-04-12 DIAGNOSIS — R51 Headache: Secondary | ICD-10-CM | POA: Diagnosis not present

## 2016-04-12 DIAGNOSIS — Z79899 Other long term (current) drug therapy: Secondary | ICD-10-CM | POA: Diagnosis not present

## 2016-04-12 DIAGNOSIS — J45909 Unspecified asthma, uncomplicated: Secondary | ICD-10-CM

## 2016-04-12 DIAGNOSIS — J4520 Mild intermittent asthma, uncomplicated: Secondary | ICD-10-CM

## 2016-04-12 DIAGNOSIS — E282 Polycystic ovarian syndrome: Secondary | ICD-10-CM

## 2016-04-12 DIAGNOSIS — I10 Essential (primary) hypertension: Secondary | ICD-10-CM | POA: Diagnosis not present

## 2016-04-12 DIAGNOSIS — Z87891 Personal history of nicotine dependence: Secondary | ICD-10-CM

## 2016-04-12 MED ORDER — AMLODIPINE BESYLATE 10 MG PO TABS: 10.0000 mg | ORAL_TABLET | Freq: Every day | ORAL | 2 refills | Status: DC

## 2016-04-12 MED ORDER — ALBUTEROL SULFATE HFA 108 (90 BASE) MCG/ACT IN AERS: 2.0000 | INHALATION_SPRAY | Freq: Four times a day (QID) | RESPIRATORY_TRACT | 3 refills | Status: DC | PRN

## 2016-04-12 NOTE — Assessment & Plan Note (Addendum)
Referred to gynecology at her last visit. Patient says she was seen by [place] [facility] this past Monday. PCOS diagnosis was confirmed and she was instructed to continue her spironolactone 50 mg daily. Records are not available in our system. She is scheduled for an ultrasound in the near future.  -- F/u with gynecology  -- Continue spironolactone

## 2016-04-12 NOTE — Progress Notes (Signed)
   CC: Headache, blood pressure follow up   HPI:  [name] is a 36 y.o. F here for follow up of headaches a blood pressure. Patient was previously prescribed lisinopril 5 mg but stopped this medication due to cough and asthma exacerbations. She was off treatment for over a year when she began to experience headaches earlier this month. She was evaluated in the ED and found to have a SBP in the 180s. CT head was negative and she was discharged with plans to follow up with her PCP. She was then started on spironolactone for concurrent HTN and PCOS. On follow up today, patient reports that she has continued to experience severe headaches to the point where she has had to call out of work. She denies any visual changes, chest pain, and SOB. She has been taking naproxen and Zofran with some relief, but the naproxen sometimes upsets her stomach.  Patient says she checks her blood pressure regularly at home and usually runs in the 160s - 180s range. Her higher blood pressure readings usually correlate with her more severe headaches.   Past Medical History:  Diagnosis Date  . Anemia   . Asthma   . Hypertension   . Obesity     Review of Systems:  All pertinents listed in HPI, otherwise negative  Physical Exam:  Vitals:   04/12/16 1329  BP: (!) 162/100  Pulse: 93  Temp: 98.3 F (36.8 C)  SpO2: 98%  Weight: (!) 312 lb 11.2 oz (141.8 kg)    Constitutional: Obese, NAD, appears comfortable HEENT: PERRL  Cardiovascular: Distant heart sounds but RRR Pulmonary/Chest: CTAB, no wheezes, rales, or rhonchi.  Neurological: A&Ox3, CN II - XII grossly intact. No focal deficits. Strength and sensation intact bilaterally.    Assessment & Plan:   See Encounters Tab for problem based charting.  Patient discussed with Dr. Rogelia Boga

## 2016-04-12 NOTE — Assessment & Plan Note (Signed)
Currently well controlled. Denies symptoms today. -- Refilled prn albuterol

## 2016-04-12 NOTE — Patient Instructions (Addendum)
Ms. Alsbury,   I have sent a prescription for a new blood pressure medicine to your pharmacy. Please start taking amlodipine 10 mg tablets once a day in addition to your Spironolactone. We will call you with the results of your blood test. Please follow up in 2 weeks for blood pressure recheck. In the meantime for your headache, you may continue to use Naproxen as needed. You may also alternate this with tylenol if it upsets your stomach. You may take up to 1 g of tylenol up to three times a day. Please continue to check your blood pressure at home. If you blood pressure is > 180/100 please give our office a call. If you have any questions or concerns, call our clinic at [phone] or after hours call [phone] and ask for the internal medicine resident on call. Thank you!  - Dr. Antony Contras

## 2016-04-12 NOTE — Assessment & Plan Note (Signed)
Patient is here for blood pressure follow up. She is uncontrolled on her current regimen of spironolactone 50 mg daily (prescribed for concomitant PCOS). She reports frequent urination with this medication. She has taken lisinopril in the past but was discontinued due to her asthma and cough. She regularly checks her blood pressure at home and usually runs in the 160s-180s range. She has severe headaches associated with her higher readings. She has been taking naproxen and zofran with minimal relief. She experiences some mild upset stomach with naproxen and is unable to take it daily. We have discussed the need for a second blood pressure agents. I reassured patient that her headaches should get better with better control of her blood pressure. I have instructed patient to continue checking her blood pressure at home and to call our clinic if her BP is >180/100 or is she becomes symptomatic. Patient expressed understanding.  -- Start amlodipine 10 mg daily  -- Continue Spironolactone 50 mg daily  -- BMP check today  -- Alternate naproxen and tylenol for headache  -- F/u 2 weeks

## 2016-04-13 LAB — BMP8+ANION GAP
ANION GAP: 19 mmol/L — AB (ref 10.0–18.0)
BUN / CREAT RATIO: 14 (ref 9–23)
BUN: 9 mg/dL (ref 6–20)
CO2: 21 mmol/L (ref 18–29)
Calcium: 9.7 mg/dL (ref 8.7–10.2)
Chloride: 98 mmol/L (ref 96–106)
Creatinine, Ser: 0.63 mg/dL (ref 0.57–1.00)
GFR, EST AFRICAN AMERICAN: 135 mL/min/{1.73_m2} (ref >59)
GFR, EST NON AFRICAN AMERICAN: 117 mL/min/{1.73_m2} (ref >59)
Glucose: 94 mg/dL (ref 65–99)
Potassium: 4 mmol/L (ref 3.5–5.2)
SODIUM: 138 mmol/L (ref 134–144)

## 2016-04-18 ENCOUNTER — Ambulatory Visit: Payer: BLUE CROSS/BLUE SHIELD

## 2016-04-24 NOTE — Progress Notes (Signed)
Internal Medicine Clinic Attending  Case discussed with Dr. Guilloud at the time of the visit.  We reviewed the resident's history and exam and pertinent patient test results.  I agree with the assessment, diagnosis, and plan of care documented in the resident's note.  

## 2016-04-25 NOTE — Addendum Note (Signed)
Addended by: Burnell Blanks on: 04/25/2016 08:51 AM   Modules accepted: Level of Service

## 2016-05-01 ENCOUNTER — Ambulatory Visit (HOSPITAL_COMMUNITY)
Admission: EM | Admit: 2016-05-01 | Discharge: 2016-05-01 | Disposition: A | Discharge status: Home / Self Care | Payer: BLUE CROSS/BLUE SHIELD | Attending: Emergency Medicine | Admitting: Emergency Medicine

## 2016-05-01 ENCOUNTER — Encounter (HOSPITAL_COMMUNITY): Payer: Self-pay | Admitting: Emergency Medicine

## 2016-05-01 DIAGNOSIS — J0110 Acute frontal sinusitis, unspecified: Secondary | ICD-10-CM

## 2016-05-01 DIAGNOSIS — J069 Acute upper respiratory infection, unspecified: Secondary | ICD-10-CM | POA: Diagnosis not present

## 2016-05-01 MED ORDER — DOXYCYCLINE HYCLATE 100 MG PO CAPS: 100.0000 mg | ORAL_CAPSULE | Freq: Two times a day (BID) | ORAL | 0 refills | Status: AC

## 2016-05-01 MED ORDER — HYDROCOD POLST-CPM POLST ER 10-8 MG/5ML PO SUER: 5.0000 mL | Freq: Two times a day (BID) | ORAL | 0 refills | Status: DC | PRN

## 2016-05-01 MED ORDER — PREDNISONE 50 MG PO TABS: 50.0000 mg | ORAL_TABLET | Freq: Every day | ORAL | 0 refills | Status: AC

## 2016-05-01 MED ORDER — MOMETASONE FUROATE 50 MCG/ACT NA SUSP: 2.0000 | Freq: Every day | NASAL | 0 refills | Status: DC

## 2016-05-01 MED ORDER — ALBUTEROL SULFATE HFA 108 (90 BASE) MCG/ACT IN AERS: 1.0000 | INHALATION_SPRAY | Freq: Four times a day (QID) | RESPIRATORY_TRACT | 0 refills | Status: DC | PRN

## 2016-05-01 NOTE — Discharge Instructions (Signed)
Take the medication as written. Start regular Mucinex. Return if you get worse, have a fever >100.4, or for any concerns. You may take 600 mg of motrin with 1 gram of tylenol up to 3-4 times a day as needed for pain. This is an effective combination for pain.  Do not take the Naprosyn if you're taking ibuprofen. Most sinus infections are viral and do not need antibiotics unless you have a high fever, have had this for 10 days, or you get better and then get sick again. Use a neti pot or the NeilMed sinus rinse as often as you want to to reduce nasal congestion. Follow the directions on the box.   Go to www.goodrx.com to look up your medications. This will give you a list of where you can find your prescriptions at the most affordable prices.

## 2016-05-01 NOTE — ED Provider Notes (Signed)
HPI  SUBJECTIVE:  Victoria Booth is a 36 y.o. female who presents with green nasal congestion, rhinorrhea, postnasal drip, sore throat x 6 days. Report right sided epistaxis when she blows/wipes her nose, frontal HA, wheezing at night, cough productive of the same material as her nasal congestion. Pt has tried Robitussin, nyquil, saline nasal spray, albuterol  20 mg prednisone x 1. States robitussin, albuterol, prednisone helped. No aggravating factors. Denies fevers, ear pain sinus pain or pressure, upper dental pain, facial swelling, voice changes, drooling, trismus, difficulty breathing, sensation of throat swelling shut, CP, SOB. Unable to sleep at night secondary to coughing. Also itchy, watery eyes, but no sneezing. PMH HTN, asthma, PCOS, allergies, former smoker quit one year ago. No h/o DM. LMP: 3/16 denies possibility of being pregnant. PMD: Rich Number, MD   Past Medical History:  Diagnosis Date  . Anemia   . Asthma   . Hypertension   . Obesity     Past Surgical History:  Procedure Laterality Date  . TOENAIL EXCISION      Family History  Problem Relation Age of Onset  . Diabetes Mother   . Hypertension Mother     Social History  Substance Use Topics  . Smoking status: Former Smoker    Packs/day: 0.10    Types: Cigarettes    Quit date: 04/06/2015  . Smokeless tobacco: Never Used     Comment: 1 cig / month.  . Alcohol use No    No current facility-administered medications for this encounter.   Current Outpatient Prescriptions:  .  amLODipine (NORVASC) 10 MG tablet, Take 1 tablet (10 mg total) by mouth daily., Disp: 30 tablet, Rfl: 2 .  spironolactone (ALDACTONE) 50 MG tablet, Take 1 tablet (50 mg total) by mouth daily., Disp: 30 tablet, Rfl: 11 .  albuterol (PROVENTIL HFA;VENTOLIN HFA) 108 (90 Base) MCG/ACT inhaler, Inhale 1-2 puffs into the lungs every 6 (six) hours as needed for wheezing or shortness of breath., Disp: 18 g, Rfl: 0 .  chlorpheniramine-HYDROcodone  (TUSSIONEX PENNKINETIC ER) 10-8 MG/5ML SUER, Take 5 mLs by mouth every 12 (twelve) hours as needed for cough., Disp: 120 mL, Rfl: 0 .  doxycycline (VIBRAMYCIN) 100 MG capsule, Take 1 capsule (100 mg total) by mouth 2 (two) times daily., Disp: 14 capsule, Rfl: 0 .  mometasone (NASONEX) 50 MCG/ACT nasal spray, Place 2 sprays into the nose daily., Disp: 17 g, Rfl: 0 .  naproxen (NAPROSYN) 500 MG tablet, Take 1 tablet (500 mg total) by mouth 2 (two) times daily with a meal., Disp: 30 tablet, Rfl: 0 .  ondansetron (ZOFRAN ODT) 4 MG disintegrating tablet, Take 1 tablet (4 mg total) by mouth every 8 (eight) hours as needed., Disp: 10 tablet, Rfl: 0 .  predniSONE (DELTASONE) 50 MG tablet, Take 1 tablet (50 mg total) by mouth daily with breakfast., Disp: 5 tablet, Rfl: 0  Allergies  Allergen Reactions  . Penicillins Shortness Of Breath and Rash    Has patient had a PCN reaction causing immediate rash, facial/tongue/throat swelling, SOB or lightheadedness with hypotension:yes Has patient had a PCN reaction causing severe rash involving mucus membranes or skin necrosis: no Has patient had a PCN reaction that required hospitalization : no Has patient had a PCN reaction occurring within the last 10 years:no  If all of the above answers are "NO", then may proceed with Cephalosporin use.   . Shellfish Allergy Swelling  . Sulfa Antibiotics Hives  . Iodine Rash     ROS  As noted in HPI.   Physical Exam  BP (!) 156/98 (BP Location: Right Arm) Comment (BP Location): regular cuff, right forearm  Pulse 99   Temp 98.2 F (36.8 C) (Oral)   Resp (!) 24   LMP 03/29/2016   SpO2 98%   Constitutional: Well developed, well nourished, no acute distress Eyes:  EOMI, conjunctiva normal bilaterally HENT: Normocephalic, atraumatic,mucus membranes moist. Positive erythematous, swollen turbinates with mucoid nasal congestion. Positive frontal sinus tenderness. No maxillary sinus tenderness. Normal oropharynx. No  cobblestoning or obvious postnasal drip Respiratory: Normal inspiratory effort, lungs clear bilaterally, good air movement Cardiovascular: Normal rate regular rhythm no murmurs rubs gallops GI: nondistended skin: No rash, skin intact Musculoskeletal: no deformities Neurologic: Alert & oriented x 3, no focal neuro deficits Psychiatric: Speech and behavior appropriate   ED Course   Medications - No data to display  No orders of the defined types were placed in this encounter.   No results found for this or any previous visit (from the past 24 hour(s)). No results found.  ED Clinical Impression  Upper respiratory tract infection, unspecified type  Acute frontal sinusitis, recurrence not specified   ED Assessment/Plan  Presentation most consistent with a URI that is transitioning into a sinusitis. We will send home with Nasonex or Flonase, saline nasal irrigation, regular Mucinex, tussionex for the cough. We'll refill her albuterol inhaler since she states that this is helping with her cough and wheezing at night, will also sent home with prednisone 50 mg for 5 days which will help with a sinusitis and also any asthma exacerbation. Lungs are Clear today. Also wait-and-see prescription of doxycycline for sinusitis if not better after 10 days of being sick.  Follow-up with PMD as needed. Discussed MDM, plan and followup with patient .Patient agrees with plan.   Meds ordered this encounter  Medications  . albuterol (PROVENTIL HFA;VENTOLIN HFA) 108 (90 Base) MCG/ACT inhaler    Sig: Inhale 1-2 puffs into the lungs every 6 (six) hours as needed for wheezing or shortness of breath.    Dispense:  18 g    Refill:  0  . predniSONE (DELTASONE) 50 MG tablet    Sig: Take 1 tablet (50 mg total) by mouth daily with breakfast.    Dispense:  5 tablet    Refill:  0  . mometasone (NASONEX) 50 MCG/ACT nasal spray    Sig: Place 2 sprays into the nose daily.    Dispense:  17 g    Refill:  0  .  chlorpheniramine-HYDROcodone (TUSSIONEX PENNKINETIC ER) 10-8 MG/5ML SUER    Sig: Take 5 mLs by mouth every 12 (twelve) hours as needed for cough.    Dispense:  120 mL    Refill:  0  . doxycycline (VIBRAMYCIN) 100 MG capsule    Sig: Take 1 capsule (100 mg total) by mouth 2 (two) times daily.    Dispense:  14 capsule    Refill:  0    *This clinic note was created using Scientist, clinical (histocompatibility and immunogenetics). Therefore, there may be occasional mistakes despite careful proofreading.  ?   Domenick Gong, MD 05/02/16 1038

## 2016-05-01 NOTE — ED Triage Notes (Signed)
Onset last Thursday of symptoms.  Initially had a sore throat, stuffy nose and headache.  Thought she was getting better, but symptoms reoccurred.  Patient has had nose bleeds

## 2016-06-03 ENCOUNTER — Telehealth: Payer: Self-pay | Admitting: Internal Medicine

## 2016-06-03 NOTE — Telephone Encounter (Signed)
APT. REMINDER CALL, LMTCB °

## 2016-06-04 ENCOUNTER — Encounter (INDEPENDENT_AMBULATORY_CARE_PROVIDER_SITE_OTHER): Payer: Self-pay

## 2016-06-04 ENCOUNTER — Ambulatory Visit (INDEPENDENT_AMBULATORY_CARE_PROVIDER_SITE_OTHER): Payer: BLUE CROSS/BLUE SHIELD | Admitting: Internal Medicine

## 2016-06-04 ENCOUNTER — Encounter: Payer: Self-pay | Admitting: Internal Medicine

## 2016-06-04 VITALS — BP 195/117 | HR 87 | Temp 97.6°F | Resp 20 | Ht 66.0 in | Wt 316.8 lb

## 2016-06-04 DIAGNOSIS — Z87891 Personal history of nicotine dependence: Secondary | ICD-10-CM

## 2016-06-04 DIAGNOSIS — I10 Essential (primary) hypertension: Secondary | ICD-10-CM

## 2016-06-04 DIAGNOSIS — Z6841 Body Mass Index (BMI) 40.0 and over, adult: Secondary | ICD-10-CM

## 2016-06-04 DIAGNOSIS — D509 Iron deficiency anemia, unspecified: Secondary | ICD-10-CM

## 2016-06-04 DIAGNOSIS — E282 Polycystic ovarian syndrome: Secondary | ICD-10-CM | POA: Diagnosis not present

## 2016-06-04 DIAGNOSIS — R109 Unspecified abdominal pain: Secondary | ICD-10-CM

## 2016-06-04 DIAGNOSIS — R1032 Left lower quadrant pain: Secondary | ICD-10-CM | POA: Diagnosis not present

## 2016-06-04 MED ORDER — SPIRONOLACTONE 100 MG PO TABS: 100.0000 mg | ORAL_TABLET | Freq: Every day | ORAL | 1 refills | Status: DC

## 2016-06-04 NOTE — Patient Instructions (Signed)
General Instructions: - Increase Spironolactone to 100 mg daily - Ok to take [employer] for allergies - Work on weight loss goals: walking during lunch break and after work 3 times per week for 30 minutes. Meal prepping is a great idea! - Will check urine sample today. Can take tylenol for back pain/abdominal pain, would avoid ibuprofen as this can increase your blood pressure - Follow up in 2 weeks for blood pressure recheck   Please bring your medicines with you each time you come to clinic.  Medicines may include prescription medications, over-the-counter medications, herbal remedies, eye drops, vitamins, or other pills.   Progress Toward Treatment Goals:  No flowsheet data found.  Self Care Goals & Plans:  Self Care Goal 10/10/2015  Manage my medications take my medicines as prescribed; bring my medications to every visit; refill my medications on time  Monitor my health keep track of my weight; keep track of my blood pressure  Eat healthy foods eat baked foods instead of fried foods; eat foods that are low in salt; drink diet soda or water instead of juice or soda  Be physically active find an activity I enjoy; take the stairs instead of the elevator  Stop smoking -  Meeting treatment goals -    No flowsheet data found.   Care Management & Community Referrals:  No flowsheet data found.

## 2016-06-04 NOTE — Progress Notes (Signed)
   CC: Hypertension follow up  HPI:  [name] is a 36 y.o. woman with PMHx as noted below who presents today for follow up of her hypertension.  HTN: BP is elevated at 166/88. She reports taking her Spironolactone 50 mg daily and Amlodipine 10 mg daily. She notes her blood pressures have been running high at home too in the 160s-170s systolic.  PCOS: Reports she had an appointment with OBGYN today. Plan is to do a pelvic ultrasound. She has been doing well on Spironolactone 50 mg daily. She reports her periods have been irregular, going months without a menstrual period and then when she does get one it will last 8-10 days. She denies heavy bleeding currently. Her last MP was about 2 weeks ago.   Microcytic Anemia: Hgb 9.4, MCV 69.7 in August 2017. Ferritin 73 in 2016. She has a hx of anemia attributed to abnormal uterine bleeding in the past. She does not take iron therapy.   Left flank/lower abdominal pain: She describes a throbbing, pulsating pain in her left flank and left lower quadrant. Her pain started about 1 week ago and has gradually become more frequent. She reports the pain was initially in her left back/flank and then moved to the LLQ yesterday. She feels the pain is sometimes related to a spasm. She describes the pain even involving her "genitals" at times. She reports having dysuria 2 days ago but has since resolved. She also reports frequency but denies hematuria. She denies fevers, chills, nausea, vomiting, or diarrhea. She has tried ibuprofen and tylenol with minimal relief.   Obesity: BMI 51.2. She has gained 4 lbs since her last visit 1 month ago. She reports she started walking for 30 minutes during her lunch break and recently joined a gym. She is motivated to lose weight. She states she has a friend who lost 100 lbs with diet and exercise and she is going to meet up with her soon to learn how to meal prep.   Past Medical History:  Diagnosis Date  . Anemia   .  Asthma   . Hypertension   . Obesity     Review of Systems:   All negative except per HPI  Physical Exam:  Vitals:   06/04/16 1554  BP: (!) 166/88  Pulse: 87  Temp: 97.6 F (36.4 C)  TempSrc: Oral  Weight: (!) 316 lb 12.8 oz (143.7 kg)   General: Obese young woman in NAD CV: RRR, no m/g/r Pulm: CTA bilaterally, breaths non-labored Abd: BS+, soft, obese, tenderness to palpation in the left flank, LLQ, and suprapubic areas   Assessment & Plan:   See Encounters Tab for problem based charting.  Patient discussed with Dr. Rogelia Boga

## 2016-06-05 ENCOUNTER — Telehealth: Payer: Self-pay | Admitting: *Deleted

## 2016-06-05 ENCOUNTER — Encounter: Payer: Self-pay | Admitting: Internal Medicine

## 2016-06-05 ENCOUNTER — Ambulatory Visit (INDEPENDENT_AMBULATORY_CARE_PROVIDER_SITE_OTHER): Payer: BLUE CROSS/BLUE SHIELD | Admitting: Internal Medicine

## 2016-06-05 VITALS — BP 170/89 | HR 105 | Temp 98.5°F | Ht 66.0 in | Wt 315.0 lb

## 2016-06-05 DIAGNOSIS — R109 Unspecified abdominal pain: Secondary | ICD-10-CM | POA: Insufficient documentation

## 2016-06-05 DIAGNOSIS — R3129 Other microscopic hematuria: Secondary | ICD-10-CM

## 2016-06-05 DIAGNOSIS — E669 Obesity, unspecified: Secondary | ICD-10-CM | POA: Diagnosis not present

## 2016-06-05 DIAGNOSIS — R000 Tachycardia, unspecified: Secondary | ICD-10-CM

## 2016-06-05 DIAGNOSIS — R1032 Left lower quadrant pain: Secondary | ICD-10-CM

## 2016-06-05 DIAGNOSIS — R110 Nausea: Secondary | ICD-10-CM | POA: Diagnosis not present

## 2016-06-05 DIAGNOSIS — Z87442 Personal history of urinary calculi: Secondary | ICD-10-CM

## 2016-06-05 LAB — URINALYSIS, ROUTINE W REFLEX MICROSCOPIC
BILIRUBIN UA: NEGATIVE
Glucose, UA: NEGATIVE
LEUKOCYTES UA: NEGATIVE
Nitrite, UA: NEGATIVE
Specific Gravity, UA: >=1.03 — AB (ref 1.005–1.030)
Urobilinogen, Ur: 1 mg/dL (ref 0.2–1.0)
pH, UA: 5.5 (ref 5.0–7.5)

## 2016-06-05 LAB — MICROSCOPIC EXAMINATION
Casts: NONE SEEN /lpf
RBC, UA: >30 /hpf — AB (ref 0–?)

## 2016-06-05 MED ORDER — TAMSULOSIN HCL 0.4 MG PO CAPS
0.4000 mg | ORAL_CAPSULE | Freq: Every day | ORAL | 0 refills | Status: DC
Start: 2016-06-05 — End: 2016-09-24

## 2016-06-05 MED ORDER — OXYCODONE-ACETAMINOPHEN 5-325 MG PO TABS: 1.0000 | ORAL_TABLET | Freq: Three times a day (TID) | ORAL | 0 refills | Status: DC | PRN

## 2016-06-05 NOTE — Telephone Encounter (Signed)
Pt calls for results of her lab work 4/24, please call her at [phone]

## 2016-06-05 NOTE — Assessment & Plan Note (Signed)
BP significantly elevated today. She is also in pain today but her BP has remained uncontrolled for several months. Will have her increase Spironolactone to 100 mg daily and continue Amlodipine 10 mg daily. Follow up in 2 weeks for BP recheck and bmet to check potassium.

## 2016-06-05 NOTE — Assessment & Plan Note (Signed)
Patient with morbid obesity, her BMI is 51.2. We extensively discussed the importance of weight loss. She will continue to walk 30 minutes daily during her lunch break and start walking 3 times weekly for 30 minutes after work as well. Encouraged her to use her gym membership and try fitness classes. She will work on her diet by incorporating more fresh vegetables, fruits, and whole grains. She is learning to meal prep which is a great idea as she tends to eat out at restaurants for meals. Will have her follow up in 3 months to see how her weight loss goals are going.

## 2016-06-05 NOTE — Telephone Encounter (Signed)
Accidentally opened new encounter

## 2016-06-05 NOTE — Progress Notes (Signed)
   CC: Follow up of left flank pain   HPI:  [name] is a 36 y.o. woman with PMHx as noted below who presents today for follow up of her left flank pain.  Patient was just evaluated yesterday for left flank pain that radiated to the left lower abdomen. A UA was checked which revealed no infection, but did show >30 RBCs per hpf. I called to discuss these results with the patient and my concern for a possible kidney stone. On the phone, she had stated she was having worsening pain and now with nausea. She had to miss work today due to the pain and she has not missed a work day in 2 years. Patient was asked to return to clinic for further evaluation.   She reports her pain is currently a 7/10 in severity and will become a 10/10 at times when she gets the "spasms" of her left flank into her lower abdomen. She has been taking Tylenol which alleviates the pain for 20-30 minutes. She describes associated nausea but no vomiting. She has been able to tolerate fluids well, but notes decreased appetite. She denies any fevers, dysuria, or frank hematuria. She reports a hx of kidney stone about 3 years ago which took her several months to pass. She states the stone eventually passed but was never sent in to be analyzed.   Past Medical History:  Diagnosis Date  . Anemia   . Asthma   . Hypertension   . Obesity     Review of Systems:   All negative except per HPI  Physical Exam:  Vitals:   06/05/16 1528  BP: (!) 170/89  Pulse: (!) 105  Temp: 98.5 F (36.9 C)  TempSrc: Oral  SpO2: 98%  Weight: (!) 315 lb (142.9 kg)  Height: 5\' 6"  (1.676 m)   General: Obese young woman, non-toxic appearing  CV: Tachycardic in 100s, no m/g/r Abd: BS+, soft, tenderness in the left flank, LLQ, and suprapubic areas, unchanged from yesterday  Assessment & Plan:   See Encounters Tab for problem based charting.  Patient discussed with Dr. Rogelia Boga

## 2016-06-05 NOTE — Patient Instructions (Addendum)
General Instructions: - Start Flomax (Tamsulosin) 0.4 mg daily - Drink lots of water! Please try to eat even though you are not hungry - Ok to take ibuprofen 600 mg three times daily as needed- This should help with spasms! - Only use Percocet for severe pain - We need to get a CT abdomen/pelvis to evaluate for kidney stone size vs some other reason for your pain - Collect kidney stone. Bring in to clinic if able to collect with urine hat. - Please return to clinic if symptoms worsening   Please bring your medicines with you each time you come to clinic.  Medicines may include prescription medications, over-the-counter medications, herbal remedies, eye drops, vitamins, or other pills.   Progress Toward Treatment Goals:  No flowsheet data found.  Self Care Goals & Plans:  Self Care Goal 10/10/2015  Manage my medications take my medicines as prescribed; bring my medications to every visit; refill my medications on time  Monitor my health keep track of my weight; keep track of my blood pressure  Eat healthy foods eat baked foods instead of fried foods; eat foods that are low in salt; drink diet soda or water instead of juice or soda  Be physically active find an activity I enjoy; take the stairs instead of the elevator  Stop smoking -  Meeting treatment goals -    No flowsheet data found.   Care Management & Community Referrals:  No flowsheet data found.

## 2016-06-05 NOTE — Assessment & Plan Note (Signed)
Stable. She is following with OBGYN and there are plans to get pelvic imaging. Will follow up results.

## 2016-06-05 NOTE — Assessment & Plan Note (Signed)
She is presenting with a 1 week hx of left flank pain that has now radiated to her left lower quadrant and suprapubic area. She described some urinary symptoms so will check a UA to rule out UTI. She also has a hx of kidney stones but reports pain is not as severe as prior kidney stone episodes. Will monitor closely. Patient advised to call clinic if symptoms worsen.  Update: UA with no evidence for infection but is showing >30 RBCs. I am concerned about a possible kidney stone. Discussed with patient on phone and she states she had to stay home today due to pain. Recommended that she come into clinic for re-evaluation as she may need stronger pain control agents. She will come in at 3:15 PM.

## 2016-06-05 NOTE — Assessment & Plan Note (Signed)
Her anemia has been stable with Hgb in the 9 range. Does not seem to be iron deficiency with her ferritin being normal. Her MCV is low which could represent a thalassemia. Will continue to monitor as she has been stable.

## 2016-06-05 NOTE — Telephone Encounter (Signed)
Discussed lab results with patient, including blood in urine. She states her pain is worse today and had to stay home from work. I'm concerned about a kidney stone; she has had them in the past. Will have her come into [facility] at 3:15PM.

## 2016-06-06 ENCOUNTER — Telehealth: Payer: Self-pay

## 2016-06-06 ENCOUNTER — Telehealth: Payer: Self-pay | Admitting: Internal Medicine

## 2016-06-06 LAB — BMP8+ANION GAP
Anion Gap: 19 mmol/L — ABNORMAL HIGH (ref 10.0–18.0)
BUN / CREAT RATIO: 11 (ref 9–23)
BUN: 7 mg/dL (ref 6–20)
CHLORIDE: 104 mmol/L (ref 96–106)
CO2: 19 mmol/L (ref 18–29)
Calcium: 9.4 mg/dL (ref 8.7–10.2)
Creatinine, Ser: 0.64 mg/dL (ref 0.57–1.00)
GFR calc non Af Amer: 117 mL/min/{1.73_m2} (ref >59)
GFR, EST AFRICAN AMERICAN: 135 mL/min/{1.73_m2} (ref >59)
Glucose: 110 mg/dL — ABNORMAL HIGH (ref 65–99)
Potassium: 4.1 mmol/L (ref 3.5–5.2)
SODIUM: 142 mmol/L (ref 134–144)

## 2016-06-06 NOTE — Assessment & Plan Note (Signed)
Patient with worsening left flank pain that radiates to the left and lower abdomen now associated with nausea. Her UA yesterday showed microscopic hematuria. With her hx of kidney stones I am concerned that she may be having another kidney stone. Her vitals are stable (BP elevated but likely secondary to pain) and her pain has been manageable with tylenol alone so admission to the hospital is not necessary at this time. Will check a bmet today to ensure Cr is stable. Will obtain CT abdomen/pelvis given her symptoms have been ongoing for over a week, to confirm the diagnosis of a kidney stone, and to evaluate for hydronephrosis. Will have her start Flomax 0.4 mg daily to help with passage of the stone. She was encouraged to take ibuprofen instead of Tylenol as this can help with ureteral spasms. She was also given a prescription for Percocet 5-325 mg Q8H PRN #30 tablets to be taken for severe pain. She was given a urine hat to use at home to catch the stone and advised to bring the stone in to be analyzed if able to catch. She was advised to go to the emergency room if she develops worsening pain, nausea with vomiting, inability to tolerate PO intake, or fevers. If CT abdomen/pelvis does not show a kidney stone she will need referral to Urology for cystoscopy to determine the etiology of her hematuria. She has evidence of hematuria since 2013 that has not been shown to have resolved. Will monitor closely.

## 2016-06-06 NOTE — Telephone Encounter (Signed)
Requesting to speak with a nurse about a note for work. Please call back.

## 2016-06-06 NOTE — Telephone Encounter (Signed)
Open in Error Wrong DOB

## 2016-06-07 ENCOUNTER — Other Ambulatory Visit: Payer: Self-pay | Admitting: Internal Medicine

## 2016-06-07 NOTE — Progress Notes (Signed)
Internal Medicine Clinic Attending  Case discussed with Dr. Rivet soon after the resident saw the patient.  We reviewed the resident's history and exam and pertinent patient test results.  I agree with the assessment, diagnosis, and plan of care documented in the resident's note.  

## 2016-06-07 NOTE — Telephone Encounter (Signed)
Called lm for pt r/t letter

## 2016-06-07 NOTE — Telephone Encounter (Signed)
Have letter ready. She can pick up or we can fax to her work. Whatever she prefers.

## 2016-06-07 NOTE — Progress Notes (Signed)
Internal Medicine Clinic Attending  Case discussed with Dr. Rivet at the time of the visit.  We reviewed the resident's history and exam and pertinent patient test results.  I agree with the assessment, diagnosis, and plan of care documented in the resident's note.  

## 2016-06-14 ENCOUNTER — Ambulatory Visit (HOSPITAL_COMMUNITY)
Admission: RE | Admit: 2016-06-14 | Discharge: 2016-06-14 | Disposition: A | Discharge status: Home / Self Care | Payer: BLUE CROSS/BLUE SHIELD | Source: Ambulatory Visit | Attending: Internal Medicine | Admitting: Internal Medicine

## 2016-06-14 ENCOUNTER — Ambulatory Visit (HOSPITAL_COMMUNITY): Payer: BLUE CROSS/BLUE SHIELD

## 2016-06-14 DIAGNOSIS — R319 Hematuria, unspecified: Secondary | ICD-10-CM | POA: Diagnosis present

## 2016-06-14 DIAGNOSIS — N200 Calculus of kidney: Secondary | ICD-10-CM | POA: Insufficient documentation

## 2016-06-14 DIAGNOSIS — R109 Unspecified abdominal pain: Secondary | ICD-10-CM | POA: Diagnosis present

## 2016-06-14 DIAGNOSIS — Z87442 Personal history of urinary calculi: Secondary | ICD-10-CM | POA: Diagnosis present

## 2016-06-17 ENCOUNTER — Ambulatory Visit (HOSPITAL_COMMUNITY): Payer: BLUE CROSS/BLUE SHIELD

## 2016-06-17 ENCOUNTER — Ambulatory Visit (HOSPITAL_COMMUNITY)
Admission: RE | Admit: 2016-06-17 | Discharge: 2016-06-17 | Disposition: A | Discharge status: Home / Self Care | Payer: BLUE CROSS/BLUE SHIELD | Source: Ambulatory Visit | Attending: Internal Medicine | Admitting: Internal Medicine

## 2016-06-17 DIAGNOSIS — N83201 Unspecified ovarian cyst, right side: Secondary | ICD-10-CM | POA: Diagnosis not present

## 2016-06-17 DIAGNOSIS — N83291 Other ovarian cyst, right side: Secondary | ICD-10-CM | POA: Insufficient documentation

## 2016-06-17 DIAGNOSIS — N926 Irregular menstruation, unspecified: Secondary | ICD-10-CM | POA: Diagnosis present

## 2016-06-18 ENCOUNTER — Ambulatory Visit (INDEPENDENT_AMBULATORY_CARE_PROVIDER_SITE_OTHER): Payer: BLUE CROSS/BLUE SHIELD | Admitting: Internal Medicine

## 2016-06-18 ENCOUNTER — Encounter: Payer: Self-pay | Admitting: Internal Medicine

## 2016-06-18 VITALS — BP 144/86 | HR 91 | Temp 98.7°F | Ht 66.0 in | Wt 314.1 lb

## 2016-06-18 DIAGNOSIS — I10 Essential (primary) hypertension: Secondary | ICD-10-CM | POA: Diagnosis not present

## 2016-06-18 DIAGNOSIS — E282 Polycystic ovarian syndrome: Secondary | ICD-10-CM

## 2016-06-18 DIAGNOSIS — Z87891 Personal history of nicotine dependence: Secondary | ICD-10-CM

## 2016-06-18 DIAGNOSIS — J309 Allergic rhinitis, unspecified: Secondary | ICD-10-CM

## 2016-06-18 DIAGNOSIS — Z79899 Other long term (current) drug therapy: Secondary | ICD-10-CM | POA: Diagnosis not present

## 2016-06-18 DIAGNOSIS — J4520 Mild intermittent asthma, uncomplicated: Secondary | ICD-10-CM | POA: Diagnosis not present

## 2016-06-18 DIAGNOSIS — E669 Obesity, unspecified: Secondary | ICD-10-CM | POA: Diagnosis not present

## 2016-06-18 DIAGNOSIS — J302 Other seasonal allergic rhinitis: Secondary | ICD-10-CM

## 2016-06-18 MED ORDER — HYDROCHLOROTHIAZIDE 25 MG PO TABS: 25.0000 mg | ORAL_TABLET | Freq: Every day | ORAL | 1 refills | Status: DC

## 2016-06-18 MED ORDER — MONTELUKAST SODIUM 10 MG PO TABS: 10.0000 mg | ORAL_TABLET | Freq: Every day | ORAL | 2 refills | Status: DC

## 2016-06-18 NOTE — Assessment & Plan Note (Addendum)
Patient is here today for blood pressure follow-up. She is on spironolactone for PCOS that was increased to 100 mg daily two weeks ago. She is also taking amlodipine 10 mg daily. Today she reports compliance with her medications. She checks her blood pressure daily at home and reports she is persistently elevated despite the increase in her spironolactone. Patient says on a good day her pressures will be in the 150s systolic, but generally run in the 170s consistently. Today in clinic her blood pressure is 144/87.  -- Start HCTZ 25 mg daily -- Continue Spironolactone 100 mg daily -- Continue Amlodipine 10 mg daily  -- BMP today  -- F/u 1 month   ADDENDUM: BMP today within normal limits, potassium 4.2. Recheck BMP at follow up.

## 2016-06-18 NOTE — Patient Instructions (Addendum)
Victoria Booth,  It was a pleasure to see today. For your blood pressure I have added a third medication called hydrochlorothiazide. Please take this daily in addition to other medications. For your allergies, I sent a new prescription to your pharmacy for Singulair. Please take this daily in addition to her Claritin intranasal Flonase. I have also placed a referral to an allergist. You will be called to schedule an appointment. I will call you with the results of your blood work today. For your PCOS ultrasound, please give your OB/GYN a call to schedule a follow-up appointment if needed. Please follow up with [place] in 1 month for your blood pressure. If you have any questions or concerns, call our clinic at [phone] or after hours call [phone] and ask for the internal medicine resident on call. Thank you!  - Dr. Antony Contras

## 2016-06-18 NOTE — Assessment & Plan Note (Signed)
Patient has a history of PCOS on spironolactone. She had a a pelvic ultrasound done yesterday that revealed a small mildly complicated cyst on her right ovary measuring 2.8 x 1.8 x 2.4 cm. She follows with [employer]. Instructed her to follow up with her gynecologist. -- F/u OBGYN

## 2016-06-18 NOTE — Progress Notes (Signed)
   CC: BP follow   HPI:  [name] is a 36 y.o. female with past medical history outlined below here for BP follow up. For the details of today's visit, please refer to the assessment and plan.  Past Medical History:  Diagnosis Date  . Anemia   . Asthma   . Hypertension   . Obesity     Review of Systems:  All pertinents listed in HPI, otherwise negative  Physical Exam:  Vitals:   06/18/16 0907  BP: (!) 144/86  Pulse: 91  Temp: 98.7 F (37.1 C)  TempSrc: Oral  SpO2: 100%  Weight: (!) 314 lb 1.6 oz (142.5 kg)  Height: 5\' 6"  (1.676 m)    Constitutional: Obese, NAD, appears comfortable Cardiovascular: RRR, no murmurs, rubs, or gallops.  Pulmonary/Chest: CTAB, no wheezes, rales, or rhonchi.  Abdominal: Soft, non tender, non distended. +BS.  Extremities: Warm and well perfused. No edema.  Neurological: A&Ox3, CN II - XII grossly intact.   Assessment & Plan:   See Encounters Tab for problem based charting.  Patient discussed with Dr. Josem Kaufmann

## 2016-06-18 NOTE — Assessment & Plan Note (Addendum)
Patient is complaining today of symptoms of uncontrolled allergic rhinitis that have been worse recently with the weather change. She is taking Claritin and using intranasal Flonase daily without relief. She also has a history of mild intermittent asthma, currently using her when necessary albuterol inhaler 2-3 times a week.  -- Start Singulair 10 mg daily  -- Continue daily claritin  -- Continue daily flonase  -- Allergy referral for immunotherapy

## 2016-06-18 NOTE — Progress Notes (Signed)
Patient ID: Victoria Booth, female   DOB: [birth year], 36 y.o.   MRN: [id number]  Case discussed with Dr. Antony Contras at the time of the visit. We reviewed the resident's history and exam and pertinent patient test results. I agree with the assessment, diagnosis, and plan of care documented in the resident's note.

## 2016-06-19 LAB — BMP8+ANION GAP
ANION GAP: 17 mmol/L (ref 10.0–18.0)
BUN/Creatinine Ratio: 12 (ref 9–23)
BUN: 8 mg/dL (ref 6–20)
CALCIUM: 9.4 mg/dL (ref 8.7–10.2)
CHLORIDE: 100 mmol/L (ref 96–106)
CO2: 21 mmol/L (ref 18–29)
Creatinine, Ser: 0.65 mg/dL (ref 0.57–1.00)
GFR calc Af Amer: 134 mL/min/{1.73_m2} (ref >59)
GFR, EST NON AFRICAN AMERICAN: 116 mL/min/{1.73_m2} (ref >59)
Glucose: 112 mg/dL — ABNORMAL HIGH (ref 65–99)
POTASSIUM: 4.2 mmol/L (ref 3.5–5.2)
Sodium: 138 mmol/L (ref 134–144)

## 2016-06-28 ENCOUNTER — Other Ambulatory Visit: Payer: Self-pay | Admitting: Internal Medicine

## 2016-06-28 DIAGNOSIS — G44209 Tension-type headache, unspecified, not intractable: Secondary | ICD-10-CM

## 2016-06-28 NOTE — Telephone Encounter (Signed)
Refill request from pharmacy for naproxen- medication no longer on med list.  Will send to pcp for review. Please advise.Victoria Spittle Cassady5/18/201811:47 AM

## 2016-07-23 ENCOUNTER — Encounter: Payer: Self-pay | Admitting: *Deleted

## 2016-08-21 ENCOUNTER — Encounter: Payer: Self-pay | Admitting: Internal Medicine

## 2016-08-21 ENCOUNTER — Ambulatory Visit (INDEPENDENT_AMBULATORY_CARE_PROVIDER_SITE_OTHER): Payer: BLUE CROSS/BLUE SHIELD | Admitting: Internal Medicine

## 2016-08-21 DIAGNOSIS — Z6841 Body Mass Index (BMI) 40.0 and over, adult: Secondary | ICD-10-CM

## 2016-08-21 DIAGNOSIS — E282 Polycystic ovarian syndrome: Secondary | ICD-10-CM

## 2016-08-21 DIAGNOSIS — J45909 Unspecified asthma, uncomplicated: Secondary | ICD-10-CM | POA: Diagnosis not present

## 2016-08-21 DIAGNOSIS — M545 Low back pain, unspecified: Secondary | ICD-10-CM

## 2016-08-21 DIAGNOSIS — I10 Essential (primary) hypertension: Secondary | ICD-10-CM

## 2016-08-21 DIAGNOSIS — E669 Obesity, unspecified: Secondary | ICD-10-CM | POA: Diagnosis not present

## 2016-08-21 DIAGNOSIS — Z833 Family history of diabetes mellitus: Secondary | ICD-10-CM

## 2016-08-21 DIAGNOSIS — Z79899 Other long term (current) drug therapy: Secondary | ICD-10-CM | POA: Diagnosis not present

## 2016-08-21 DIAGNOSIS — Z8249 Family history of ischemic heart disease and other diseases of the circulatory system: Secondary | ICD-10-CM | POA: Diagnosis not present

## 2016-08-21 DIAGNOSIS — R7303 Prediabetes: Secondary | ICD-10-CM

## 2016-08-21 DIAGNOSIS — Z87891 Personal history of nicotine dependence: Secondary | ICD-10-CM

## 2016-08-21 MED ORDER — HYDROCHLOROTHIAZIDE 50 MG PO TABS: 50.0000 mg | ORAL_TABLET | Freq: Every day | ORAL | 0 refills | Status: DC

## 2016-08-21 MED ORDER — CYCLOBENZAPRINE HCL 10 MG PO TABS: 5.0000 mg | ORAL_TABLET | ORAL | 0 refills | Status: DC | PRN

## 2016-08-21 NOTE — Patient Instructions (Addendum)
Victoria Booth  Continue the great work you're doing on exercising and changing the foods your eating. Check and see if there is a way to have a low sodium meal prep.  I referred you to [facility] for more information on cash her bypass surgery  For your low back pain I prescribed some muscle relaxants do not drive after you have taken these.  For your high blood pressure today I have increased her hydrochlorothiazide to 50 mg daily, continue taking spironolactone and amlodipine.  Please schedule a follow up appointment in 1 month.  [facility] if he needed anything

## 2016-08-21 NOTE — Progress Notes (Signed)
Medicine attending: Medical history, presenting problems, physical findings, and medications, reviewed with resident physician Dr Nina Blum on the day of the patient visit and I concur with her evaluation and management plan. 

## 2016-08-21 NOTE — Assessment & Plan Note (Addendum)
Seen in clinic on 5/8 and found to have blood pressure 144/87 despite spironolactone and amlodipine at maximal doses. At this visit she was started on hydrochlorothiazide 25 mg daily and returns today for follow-up blood pressure check. Today her blood pressure was initially 144/84 and 130/90 on recheck. She has been taking her blood pressures at home her systolics running 160s to 170s and diastolics are in the 90s. She has good medication compliance, is on a meal prep plan, and has urges to precipitate and has been walking about 17,000 steps daily. She has not had the weight loss success that she anticipated with her walking and ask for referral to bariatric surgery.  Increase hydrochlorothiazide to 50 mg daily -Continue spironolactone 100 mg daily -Continue amlodipine 10 mg daily -Follow-up BMP today  -Information provided for [facility] bariatric surgery class - RTC in 1 month for recheck of BP   ADDENDUM: potassium, sodium, and calcium are within the normal limits, Hyperglycemia is stable but HCTZ still may be worsening this. Last A1c 03/2016 5.8 so will continue with the plan to increase HCTZ and have regular A1c screening and blood glucose monitoring.

## 2016-08-21 NOTE — Assessment & Plan Note (Signed)
The last 3 days she has had a muscle spasm of her left lower back. It is worse when she sits at her desk at work or lays down and improves when she stands. This pain is nonradiating, and she has point tenderness on exam suggestive of muscle spasm. She has been using Tylenol without relief. His been told to limit NSAIDs in the past due to her hypertension. -One time script for Flexeril 10 mg as needed daily at bedtime #10  - note for work with recommendation to provide her a standing desk

## 2016-08-21 NOTE — Progress Notes (Addendum)
   CC: Follow-up of hypertension and muscle spasm of the lumbar spine  HPI:  [name] is a 36 y.o. PMH allergic rhinitis, asthma, essential hypertension, microcytic anemia, and PCOS who presents for follow up of hypertension with a new complaint of muscle spasm in her lumbar spine.  Past Medical History:  Diagnosis Date  . Anemia   . Asthma   . Hypertension   . Obesity    Review of Systems:  Was referred history of present illness and assessment and plans For pertinent review of systems, all others reviewed and negative  Physical Exam:  Vitals:   08/21/16 0918 08/21/16 0957  BP: (!) 144/84 130/90  Pulse: 89   SpO2: 98%   Weight: (!) 317 lb 12.8 oz (144.2 kg)    Physical Exam  Constitutional: She appears well-developed and well-nourished. No distress.  Cardiovascular: Normal rate and regular rhythm.   No murmur heard. Pulmonary/Chest: Effort normal. No respiratory distress. She has no wheezes. She has no rales.  Musculoskeletal: She exhibits tenderness.  Tenderness to palpation over left latissimus dorsi with some muscle tightening.   Skin: She is not diaphoretic.   Assessment & Plan:   Hypertension Seen in clinic on 5/8 and found to have blood pressure 144/87 despite spironolactone and amlodipine at maximal doses. At this visit she was started on hydrochlorothiazide 25 mg daily and returns today for follow-up blood pressure check. Today her blood pressure was initially 144/84 and 130/90 on recheck. She has been taking her blood pressures at home her systolics running 160s to 170s and diastolics are in the 90s. She has good medication compliance, is on a meal prep plan, and has urges to precipitate and has been walking about 17,000 steps daily. She has not had the weight loss success that she anticipated with her walking and ask for referral to bariatric surgery.  Increase hydrochlorothiazide to 50 mg daily -Continue spironolactone 100 mg daily -Continue amlodipine 10  mg daily -Follow-up BMP today  -Information provided for [facility] bariatric surgery class - RTC in 1 month for recheck of BP   ADDENDUM: potassium, sodium, and calcium are within the normal limits, Hyperglycemia is stable but HCTZ still may be worsening this. Last A1c 03/2016 5.8 so will continue with the plan to increase HCTZ and have regular A1c screening and blood glucose monitoring.   Back pain The last 3 days she has had a muscle spasm of her left lower back. It is worse when she sits at her desk at work or lays down and improves when she stands. This pain is nonradiating, and she has point tenderness on exam suggestive of muscle spasm. She has been using Tylenol without relief. His been told to limit NSAIDs in the past due to her hypertension. -One time script for Flexeril 10 mg as needed daily at bedtime #10  - note for work with recommendation to provide her a standing desk   See Encounters Tab for problem based charting.  Patient discussed with Dr. Cyndie Chime

## 2016-08-22 LAB — BMP8+ANION GAP
ANION GAP: 17 mmol/L (ref 10.0–18.0)
BUN/Creatinine Ratio: 16 (ref 9–23)
BUN: 11 mg/dL (ref 6–20)
CALCIUM: 9.3 mg/dL (ref 8.7–10.2)
CHLORIDE: 102 mmol/L (ref 96–106)
CO2: 20 mmol/L (ref 20–29)
Creatinine, Ser: 0.7 mg/dL (ref 0.57–1.00)
GFR calc non Af Amer: 113 mL/min/{1.73_m2} (ref >59)
GFR, EST AFRICAN AMERICAN: 131 mL/min/{1.73_m2} (ref >59)
GLUCOSE: 104 mg/dL — AB (ref 65–99)
POTASSIUM: 4.2 mmol/L (ref 3.5–5.2)
Sodium: 139 mmol/L (ref 134–144)

## 2016-09-24 ENCOUNTER — Encounter: Payer: Self-pay | Admitting: Internal Medicine

## 2016-09-24 ENCOUNTER — Ambulatory Visit (INDEPENDENT_AMBULATORY_CARE_PROVIDER_SITE_OTHER): Payer: BLUE CROSS/BLUE SHIELD | Admitting: Internal Medicine

## 2016-09-24 VITALS — BP 132/71 | HR 83 | Temp 98.1°F | Ht 66.0 in | Wt 319.9 lb

## 2016-09-24 DIAGNOSIS — Z87442 Personal history of urinary calculi: Secondary | ICD-10-CM | POA: Diagnosis not present

## 2016-09-24 DIAGNOSIS — I10 Essential (primary) hypertension: Secondary | ICD-10-CM | POA: Diagnosis not present

## 2016-09-24 DIAGNOSIS — Z79899 Other long term (current) drug therapy: Secondary | ICD-10-CM | POA: Diagnosis not present

## 2016-09-24 DIAGNOSIS — N029 Recurrent and persistent hematuria with unspecified morphologic changes: Secondary | ICD-10-CM

## 2016-09-24 DIAGNOSIS — E282 Polycystic ovarian syndrome: Secondary | ICD-10-CM

## 2016-09-24 DIAGNOSIS — D509 Iron deficiency anemia, unspecified: Secondary | ICD-10-CM | POA: Diagnosis not present

## 2016-09-24 DIAGNOSIS — Z87891 Personal history of nicotine dependence: Secondary | ICD-10-CM

## 2016-09-24 DIAGNOSIS — Z6841 Body Mass Index (BMI) 40.0 and over, adult: Secondary | ICD-10-CM

## 2016-09-24 DIAGNOSIS — Z0000 Encounter for general adult medical examination without abnormal findings: Secondary | ICD-10-CM

## 2016-09-24 DIAGNOSIS — R319 Hematuria, unspecified: Secondary | ICD-10-CM | POA: Insufficient documentation

## 2016-09-24 NOTE — Assessment & Plan Note (Signed)
-   Patient has no complaints of fatigue or DOE - Will check repeat CBC today

## 2016-09-24 NOTE — Assessment & Plan Note (Signed)
-   Patient is due for a pap smear which we will do on her next visit

## 2016-09-24 NOTE — Assessment & Plan Note (Signed)
-   Patient was noted to have a right ovarian cyst on her last ultrasound in May - She denies any groin or abdominal pain - She is scheduled to follow up with her OB/GYN next week and will address this with them

## 2016-09-24 NOTE — Assessment & Plan Note (Signed)
BP Readings from Last 3 Encounters:  09/24/16 132/71  08/21/16 130/90  06/18/16 (!) 144/86    Lab Results  Component Value Date   NA 139 08/21/2016   K 4.2 08/21/2016   CREATININE 0.70 08/21/2016    Assessment: Blood pressure control:  well controlled Progress toward BP goal:   at goal Comments: she is compliant with HCTZ 50 mg, amlodipine 10 mg and spirinolactone 100 mg  Plan: Medications:  continue current medications Educational resources provided: brochure (denies need ) Self management tools provided: home blood pressure logbook (has will bring in) Other plans: will check BMP

## 2016-09-24 NOTE — Assessment & Plan Note (Signed)
-   Discussed with patient extensively - We will give her information about a bariatric surgery class today - Explained the importance of sticking to a diet and exercise plan and referred her to nutrition services - Patient will follow up in 3 months

## 2016-09-24 NOTE — Patient Instructions (Signed)
-   It was a pleasure seeing you today - Your BP is improved! Keep up the great work! - I will give you a referral to our nutritionist for a diet plan - I will give you information on a bariatric surgery class - I will give you a note for work - We will get some lab work today - Please follow up in 3 months

## 2016-09-24 NOTE — Progress Notes (Signed)
   Subjective:    Patient ID: Victoria Booth, female    DOB: 1980-05-26, 36 y.o.   MRN: [id number]  HPI  I have seen and examined the patient. She is here for routine follow up of her HTN. Patient states she feels well today. She still has some intermittent left flank pain but has no other complaints at this time.   Review of Systems  Constitutional: Negative.   HENT: Negative.   Respiratory: Negative.   Cardiovascular: Negative.   Gastrointestinal: Negative.   Musculoskeletal: Negative.   Skin: Negative.   Neurological: Negative.   Psychiatric/Behavioral: Negative.        Objective:   Physical Exam  Constitutional: She is oriented to person, place, and time. She appears well-developed and well-nourished.  HENT:  Head: Normocephalic and atraumatic.  Mouth/Throat: No oropharyngeal exudate.  Neck: Neck supple.  Cardiovascular: Normal rate, regular rhythm and normal heart sounds.   Pulmonary/Chest: Effort normal and breath sounds normal. No respiratory distress. She has no wheezes.  Abdominal: Soft. Bowel sounds are normal. She exhibits no distension. There is no tenderness.  Musculoskeletal: Normal range of motion. She exhibits no edema.  Lymphadenopathy:    She has no cervical adenopathy.  Neurological: She is alert and oriented to person, place, and time.  Skin: Skin is warm. No rash noted. No erythema.  Psychiatric: She has a normal mood and affect. Her behavior is normal.          Assessment & Plan:  Please see problem based charting for assessment and plan:

## 2016-09-24 NOTE — Assessment & Plan Note (Addendum)
-   Patient has a history of persistent hematuria and was found to have nephrolithiasis on her CT in April - Patient states she has intermittent flank pain still but has no pain currently - She states she did not pass a stone despite being on flomax for a month - Will check repeat u/a today to assess for hematuria and consider referral to urology if present

## 2016-09-25 ENCOUNTER — Encounter: Payer: Self-pay | Admitting: Internal Medicine

## 2016-09-25 LAB — MICROSCOPIC EXAMINATION
Casts: NONE SEEN /lpf
Epithelial Cells (non renal): >10 /hpf — AB (ref 0–10)

## 2016-09-25 LAB — BMP8+ANION GAP
Anion Gap: 16 mmol/L (ref 10.0–18.0)
BUN/Creatinine Ratio: 12 (ref 9–23)
BUN: 8 mg/dL (ref 6–20)
CALCIUM: 9.3 mg/dL (ref 8.7–10.2)
CHLORIDE: 106 mmol/L (ref 96–106)
CO2: 20 mmol/L (ref 20–29)
Creatinine, Ser: 0.65 mg/dL (ref 0.57–1.00)
GFR calc non Af Amer: 116 mL/min/{1.73_m2} (ref >59)
GFR, EST AFRICAN AMERICAN: 134 mL/min/{1.73_m2} (ref >59)
GLUCOSE: 108 mg/dL — AB (ref 65–99)
Potassium: 4.4 mmol/L (ref 3.5–5.2)
Sodium: 142 mmol/L (ref 134–144)

## 2016-09-25 LAB — CBC WITH DIFFERENTIAL/PLATELET
BASOS ABS: 0 10*3/uL (ref 0.0–0.2)
Basos: 0 %
EOS (ABSOLUTE): 0.2 10*3/uL (ref 0.0–0.4)
Eos: 2 %
HEMOGLOBIN: 10 g/dL — AB (ref 11.1–15.9)
Hematocrit: 34.4 % (ref 34.0–46.6)
IMMATURE GRANS (ABS): 0 10*3/uL (ref 0.0–0.1)
Immature Granulocytes: 0 %
LYMPHS: 40 %
Lymphocytes Absolute: 3.3 10*3/uL — ABNORMAL HIGH (ref 0.7–3.1)
MCH: 18.1 pg — ABNORMAL LOW (ref 26.6–33.0)
MCHC: 29.1 g/dL — AB (ref 31.5–35.7)
MCV: 62 fL — ABNORMAL LOW (ref 79–97)
MONOCYTES: 4 %
Monocytes Absolute: 0.3 10*3/uL (ref 0.1–0.9)
NEUTROS ABS: 4.3 10*3/uL (ref 1.4–7.0)
Neutrophils: 54 %
PLATELETS: 378 10*3/uL (ref 150–379)
RBC: 5.51 x10E6/uL — AB (ref 3.77–5.28)
RDW: 17.7 % — ABNORMAL HIGH (ref 12.3–15.4)
WBC: 8.2 10*3/uL (ref 3.4–10.8)

## 2016-09-25 LAB — URINALYSIS, COMPLETE
BILIRUBIN UA: NEGATIVE
GLUCOSE, UA: NEGATIVE
Ketones, UA: NEGATIVE
NITRITE UA: NEGATIVE
Protein, UA: NEGATIVE
SPEC GRAV UA: 1.027 (ref 1.005–1.030)
Urobilinogen, Ur: 0.2 mg/dL (ref 0.2–1.0)
pH, UA: 5 (ref 5.0–7.5)

## 2016-09-30 ENCOUNTER — Ambulatory Visit: Payer: BLUE CROSS/BLUE SHIELD | Admitting: Dietician

## 2016-10-24 ENCOUNTER — Ambulatory Visit (INDEPENDENT_AMBULATORY_CARE_PROVIDER_SITE_OTHER): Payer: BLUE CROSS/BLUE SHIELD | Admitting: Internal Medicine

## 2016-10-24 VITALS — BP 130/82 | HR 66 | Temp 98.1°F | Ht 66.0 in | Wt 320.9 lb

## 2016-10-24 DIAGNOSIS — G43909 Migraine, unspecified, not intractable, without status migrainosus: Secondary | ICD-10-CM | POA: Insufficient documentation

## 2016-10-24 DIAGNOSIS — I10 Essential (primary) hypertension: Secondary | ICD-10-CM

## 2016-10-24 DIAGNOSIS — Z6841 Body Mass Index (BMI) 40.0 and over, adult: Secondary | ICD-10-CM | POA: Diagnosis not present

## 2016-10-24 DIAGNOSIS — G43119 Migraine with aura, intractable, without status migrainosus: Secondary | ICD-10-CM

## 2016-10-24 DIAGNOSIS — E282 Polycystic ovarian syndrome: Secondary | ICD-10-CM

## 2016-10-24 DIAGNOSIS — Z87891 Personal history of nicotine dependence: Secondary | ICD-10-CM

## 2016-10-24 DIAGNOSIS — E669 Obesity, unspecified: Secondary | ICD-10-CM | POA: Diagnosis not present

## 2016-10-24 MED ORDER — PROCHLORPERAZINE MALEATE 10 MG PO TABS: 10.0000 mg | ORAL_TABLET | Freq: Once | ORAL | 0 refills | Status: DC

## 2016-10-24 MED ORDER — KETOROLAC TROMETHAMINE 30 MG/ML IJ SOLN
15.0000 mg | Freq: Once | INTRAMUSCULAR | Status: AC
  Administered 2016-10-24: 15 mg via INTRAMUSCULAR

## 2016-10-24 MED ORDER — KETOROLAC TROMETHAMINE 15 MG/ML IJ SOLN: 15.0000 mg | Freq: Once | INTRAMUSCULAR | Status: DC

## 2016-10-24 NOTE — Progress Notes (Signed)
   CC: headache  HPI:  [name] is a 36 y.o. with a PMH of HTN, PCOS, obesity presenting to clinic for evaluation of headache.  Patient endorses headache onset 2 days ago soon after waking; the pain began in her right post neck then involved the entire right side of her head. She describes the pain as pounding and endorses associated symptoms of intermittent visual aura, photophobia, phonophobia, nausea. She endorses inability to sleep due to headache. She denies numbness, tingling, focal weakness, dysarthria, dysphagia. Patient has tried aleve, ibuprofen 800mg , and goody's powder without relief of symptoms. Prior to headache onset patient endorses poor sleep, but otherwise endorses good health; she denies fevers, chills, night sweats, weight loss, appetite change. She denies prior VTE.   Please see problem based Assessment and Plan for status of patients chronic conditions.  Past Medical History:  Diagnosis Date  . Anemia   . Asthma   . Hypertension   . Obesity     Review of Systems:   ROS Per HPI  Physical Exam:  Vitals:   10/24/16 1422  BP: 130/82  Pulse: 66  Temp: 98.1 F (36.7 C)  TempSrc: Oral  SpO2: 100%  Weight: (!) 320 lb 14.4 oz (145.6 kg)  Height: 5\' 6"  (1.676 m)   GENERAL- alert, co-operative, appears as stated age, not in any distress. HEENT- Atraumatic, normocephalic, PERRL, EOMI, oral mucosa appears moist CARDIAC- RRR, no murmurs, rubs or gallops. RESP- Moving equal volumes of air, and clear to auscultation bilaterally, no wheezes or crackles. ABDOMEN- Soft, nontender, bowel sounds present. NEURO- No Cr N abnormality. EXTREMITIES- pulse 2+, symmetric, no pedal edema, strength and sensation intact throughout. SKIN- Warm, dry, no rash or lesion. PSYCH- Normal mood and affect, appropriate thought content and speech.  Assessment & Plan:   See Encounters Tab for problem based charting.   Patient discussed with Dr. Fredrich Romans,  MD Internal Medicine PGY2

## 2016-10-24 NOTE — Patient Instructions (Signed)
For your migraine we gave you an injection of an anti-inflammatory medication.  When you get home, if you are still having the migraine, take  of benadryl (over the counter) followed by 1 pill of compazine (prescription given).   Please don't take any more aleve, excedrin/goody's, or ibuprofen today.   If you still have a headache tomorrow, go to an urgent care.      Practice Good Sleep Hygiene Here are some suggestions Avoid napping during the day. It can disturb the normal pattern of sleep and wakefulness.  Avoid stimulants such as caffeine, nicotine, and alcohol too close to bedtime. While alcohol is well known to speed the onset of sleep, it disrupts sleep in the second half as the body begins to metabolize the alcohol, causing arousal.  Exercise can promote good sleep. Vigorous exercise should be taken in the morning or late afternoon. A relaxing exercise, like yoga, can be done before bed to help initiate a restful night's sleep. Food can be disruptive right before sleep. Stay away from large meals close to bedtime. Also dietary changes can cause sleep problems, if someone is struggling with a sleep problem, it's not a good time to start experimenting with spicy dishes. And, remember, chocolate has caffeine.  Ensure adequate exposure to natural light. This is particularly important for older people who may not venture outside as frequently as children and adults. Light exposure helps maintain a healthy sleep-wake cycle.  Establish a regular relaxing bedtime routine. Try to avoid emotionally upsetting conversations and activities before trying to go to sleep. Don't dwell on, or bring your problems to bed.  Associate your bed with sleep. It's not a good idea to use your bed to watch TV, listen to the radio, or read.  Make sure that the sleep environment is pleasant and relaxing. The bed should be comfortable, the room should not be too hot or cold, or too bright.   Migraine Headache A  migraine headache is an intense, throbbing pain on one side or both sides of the head. Migraines may also cause other symptoms, such as nausea, vomiting, and sensitivity to light and noise. What are the causes? Doing or taking certain things may also trigger migraines, such as:  Alcohol.  Smoking.  Medicines, such as: ? Medicine used to treat chest pain (nitroglycerine). ? Birth control pills. ? Estrogen pills. ? Certain blood pressure medicines.  Aged cheeses, chocolate, or caffeine.  Foods or drinks that contain nitrates, glutamate, aspartame, or tyramine.  Physical activity.  Other things that may trigger a migraine include:  Menstruation.  Pregnancy.  Hunger.  Stress, lack of sleep, too much sleep, or fatigue.  Weather changes.  What increases the risk? The following factors may make you more likely to experience migraine headaches:  Age. Risk increases with age.  Family history of migraine headaches.  Being Caucasian.  Depression and anxiety.  Obesity.  Being a woman.  Having a hole in the heart (patent foramen ovale) or other heart problems.  What are the signs or symptoms? The main symptom of this condition is pulsating or throbbing pain. Pain may:  Happen in any area of the head, such as on one side or both sides.  Interfere with daily activities.  Get worse with physical activity.  Get worse with exposure to bright lights or loud noises.  Other symptoms may include:  Nausea.  Vomiting.  Dizziness.  General sensitivity to bright lights, loud noises, or smells.  Before you get a migraine, you  may get warning signs that a migraine is developing (aura). An aura may include:  Seeing flashing lights or having blind spots.  Seeing bright spots, halos, or zigzag lines.  Having tunnel vision or blurred vision.  Having numbness or a tingling feeling.  Having trouble talking.  Having muscle weakness.  How is this diagnosed? A migraine  headache can be diagnosed based on:  Your symptoms.  A physical exam.  Tests, such as CT scan or MRI of the head. These imaging tests can help rule out other causes of headaches.  Taking fluid from the spine (lumbar puncture) and analyzing it (cerebrospinal fluid analysis, or CSF analysis).  How is this treated? A migraine headache is usually treated with medicines that:  Relieve pain.  Relieve nausea.  Prevent migraines from coming back.  Treatment may also include:  Acupuncture.  Lifestyle changes like avoiding foods that trigger migraines.  Follow these instructions at home: Medicines  Take over-the-counter and prescription medicines only as told by your health care provider.  Do not drive or use heavy machinery while taking prescription pain medicine.  To prevent or treat constipation while you are taking prescription pain medicine, your health care provider may recommend that you: ? Drink enough fluid to keep your urine clear or pale yellow. ? Take over-the-counter or prescription medicines. ? Eat foods that are high in fiber, such as fresh fruits and vegetables, whole grains, and beans. ? Limit foods that are high in fat and processed sugars, such as fried and sweet foods. Lifestyle  Avoid alcohol use.  Do not use any products that contain nicotine or tobacco, such as cigarettes and e-cigarettes. If you need help quitting, ask your health care provider.  Get at least 8 hours of sleep every night.  Limit your stress. General instructions   Keep a journal to find out what may trigger your migraine headaches. For example, write down: ? What you eat and drink. ? How much sleep you get. ? Any change to your diet or medicines.  If you have a migraine: ? Avoid things that make your symptoms worse, such as bright lights. ? It may help to lie down in a dark, quiet room. ? Do not drive or use heavy machinery. ? Ask your health care provider what activities are  safe for you while you are experiencing symptoms.  Keep all follow-up visits as told by your health care provider. This is important. Contact a health care provider if:  You develop symptoms that are different or more severe than your usual migraine symptoms. Get help right away if:  Your migraine becomes severe.  You have a fever.  You have a stiff neck.  You have vision loss.  Your muscles feel weak or like you cannot control them.  You start to lose your balance often.  You develop trouble walking.  You faint. This information is not intended to replace advice given to you by your health care provider. Make sure you discuss any questions you have with your health care provider. Document Released: 01/28/2005 Document Revised: 08/18/2015 Document Reviewed: 07/17/2015 Elsevier Interactive Patient Education  2017 [employer].

## 2016-10-24 NOTE — Addendum Note (Signed)
Addended by: Neomia Dear on: 10/24/2016 01:46 PM   Modules accepted: Orders

## 2016-10-24 NOTE — Assessment & Plan Note (Signed)
Patient with signs/symptoms consistent with new onset migraine lasting for 2 days without relief with oral NSAIDs. She does not endorse alarm symptoms that would be concerning for CVA, ICH, or intracranial lesion. Exam is unremarkable.  Plan: --patient given toradol  IM injectio in office --she was advised if headache persists when she gets home, to take  PO benadryl and  compazine (script provided) - advised of increased drowsiness and sedation and not to operate machinery --patient advised to avoid other NSAIDs today and tomorrow at least --patient advised to f/u in urgent care tomorrow if headache persists despite above intervention --discussed basics of migraines and migraine prevention - handout given to patient --given handout on sleep hygiene

## 2016-11-06 NOTE — Progress Notes (Signed)
Internal Medicine Clinic Attending  Case discussed with Dr. Svalina  at the time of the visit.  We reviewed the resident's history and exam and pertinent patient test results.  I agree with the assessment, diagnosis, and plan of care documented in the resident's note.  

## 2016-11-20 ENCOUNTER — Ambulatory Visit (INDEPENDENT_AMBULATORY_CARE_PROVIDER_SITE_OTHER): Payer: BLUE CROSS/BLUE SHIELD | Admitting: Internal Medicine

## 2016-11-20 ENCOUNTER — Telehealth: Payer: Self-pay | Admitting: *Deleted

## 2016-11-20 ENCOUNTER — Encounter: Payer: Self-pay | Admitting: Internal Medicine

## 2016-11-20 ENCOUNTER — Ambulatory Visit (HOSPITAL_COMMUNITY)
Admission: RE | Admit: 2016-11-20 | Discharge: 2016-11-20 | Disposition: A | Discharge status: Home / Self Care | Payer: BLUE CROSS/BLUE SHIELD | Source: Ambulatory Visit | Attending: Internal Medicine | Admitting: Internal Medicine

## 2016-11-20 VITALS — BP 148/80 | HR 79 | Temp 98.7°F | Ht 66.0 in | Wt 319.2 lb

## 2016-11-20 DIAGNOSIS — Z6841 Body Mass Index (BMI) 40.0 and over, adult: Secondary | ICD-10-CM | POA: Diagnosis not present

## 2016-11-20 DIAGNOSIS — J45909 Unspecified asthma, uncomplicated: Secondary | ICD-10-CM | POA: Insufficient documentation

## 2016-11-20 DIAGNOSIS — G43119 Migraine with aura, intractable, without status migrainosus: Secondary | ICD-10-CM

## 2016-11-20 DIAGNOSIS — Z79899 Other long term (current) drug therapy: Secondary | ICD-10-CM

## 2016-11-20 DIAGNOSIS — I10 Essential (primary) hypertension: Secondary | ICD-10-CM | POA: Insufficient documentation

## 2016-11-20 MED ORDER — SUMATRIPTAN SUCCINATE 25 MG PO TABS: 25.0000 mg | ORAL_TABLET | Freq: Once | ORAL | 0 refills | Status: DC | PRN

## 2016-11-20 MED ORDER — CARVEDILOL 6.25 MG PO TABS: 6.2500 mg | ORAL_TABLET | Freq: Two times a day (BID) | ORAL | 1 refills | Status: DC

## 2016-11-20 NOTE — Assessment & Plan Note (Addendum)
The patient presented today to get her blood pressure check. Her bp during this visit was 148/80. She was last seen in September 2018 when her bp was 130/82. She is currently taking amlodipine 10mg   Qd, spironolactone 100mg  qd, and hctz 50mg  qd. The patient has lost 1lb from her previous visit in September 2018 from 320lbs to 319lbs.  The patient denied chest pain, but stated that she has been having palpitations every other day for the past 6-7 months. The patient's last ekg was done in October 2016 when she was shown to have sinus arrhythmia. No interventions were made, however the patient was instructed to inform her physician should she experience symptoms again. The patient stated that she thought the palpitations were due to her acid reflux.   The patient states that she tries to maintain a healthy diet. She has someone who meal preps healthy foods for her. She has cut sodas out of her diet, but continues to drink sweet tea. She states that she has gym membership, but does not go to the gym. She walks around the track at work.   EKG was done in office which showed normal sinus rhythm.   -Instructed the patient about the importance of lifestyle modifications.  -started carvedilol 6.25bid for htn, migraine prevention, and palpitations -follow up in 4 weeks to re-check blood pressure and discuss weight loss options

## 2016-11-20 NOTE — Telephone Encounter (Signed)
Pt called ask for all scripts to be sent to [facility], took [employer] [place] off profile

## 2016-11-20 NOTE — Assessment & Plan Note (Addendum)
The patient states that she has been having frontal headaches intermittently for the past 2 months. She states that the headache starts on her right frontal side and then becomes bilateral and last 3 days. The headaches worsen with loud noises and bright lights. She has accompanied nausea and vomiting. She has been taking ibuprofen and tylenol which have not helped. BC powder has helped. She has some occasional tingling in left hand. No speech changes. During the patient's last visit on 10/24/16, she had similar concerns and was given a tramadol injection in clinic and recommended to take compazine and benadryl at home. States that the compazine gave slight relief. She states that the benadryl does not help.   The patient states that she sleeps about 4 hrs per night. She does not have difficulty falling asleep, but has difficulty staying asleep. The patient is getting married next weekend and therefore might be acutely stressed.   The patient likely has migraine headaches. -sumatriptan 25mg  prn  -carvedilol 6.25 bid was prescribed for migraine prophylaxis -sleep hygiene was emphasized as they worsen migraine headaches

## 2016-11-20 NOTE — Progress Notes (Signed)
   CC: Blood Pressure check and headaches  HPI:  Ms.Kasidi A Paoletti is a 36 y.o. female with pmh of essential hypertension, chronic asthma, pcos, obesity, and microcytic anemia who presents for a blood pressure check and regarding headaches. Please see assessment and plan for additional details.    Past Medical History:  Diagnosis Date  . Anemia   . Asthma   . Hypertension   . Obesity    Review of Systems:  Per HPI  Physical Exam:  There were no vitals filed for this visit.  Physical Exam  Constitutional: She appears well-developed and well-nourished. No distress.  HENT:  Head: Normocephalic and atraumatic.  Eyes: Conjunctivae are normal.  Cardiovascular: Normal rate, regular rhythm and normal heart sounds.   Pulmonary/Chest: Effort normal and breath sounds normal. No respiratory distress. She has no wheezes.  Abdominal: Soft. Bowel sounds are normal. She exhibits no distension. There is no tenderness.  Neurological: She is alert. She has normal strength. No cranial nerve deficit or sensory deficit. Coordination normal.  Psychiatric: She has a normal mood and affect. Her behavior is normal. Judgment and thought content normal.    Assessment & Plan:   See Encounters Tab for problem based charting.  Patient seen with Dr. Oswaldo Done

## 2016-11-20 NOTE — Patient Instructions (Addendum)
It was a pleasure to see you today Victoria Booth. During your visit we spoke about blood pressure control and migraine headaches.   -Please try to make lifestyle modifications (diet and exercise). Exercise alone has been shown to decrease both systolic and diastolic bp by 2-40mmhg. A 15lb weight loss has been shown to decrease systolic bp by and diastolic by .   -Start sumatriptan 25mg  once daily for migraines  -Start carvedilol 6.25mg  twice a day for migraine prevention and blood pressure control  Hope you have an amazing wedding! Look forward to seeing you again.  Please contact us if you have any questions. Thank you for allowing me to be a part of your care.   Best,  Lorenso Courier, MD Internal Medicine PGY1   Migraine Headache A migraine headache is a very strong throbbing pain on one side or both sides of your head. Migraines can also cause other symptoms. Talk with your doctor about what things may bring on (trigger) your migraine headaches. Follow these instructions at home: Medicines  Take over-the-counter and prescription medicines only as told by your doctor.  Do not drive or use heavy machinery while taking prescription pain medicine.  To prevent or treat constipation while you are taking prescription pain medicine, your doctor may recommend that you: ? Drink enough fluid to keep your pee (urine) clear or pale yellow. ? Take over-the-counter or prescription medicines. ? Eat foods that are high in fiber. These include fresh fruits and vegetables, whole grains, and beans. ? Limit foods that are high in fat and processed sugars. These include fried and sweet foods. Lifestyle  Avoid alcohol.  Do not use any products that contain nicotine or tobacco, such as cigarettes and e-cigarettes. If you need help quitting, ask your doctor.  Get at least 8 hours of sleep every night.  Limit your stress. General instructions   Keep a journal to find out what may bring on  your migraines. For example, write down: ? What you eat and drink. ? How much sleep you get. ? Any change in what you eat or drink. ? Any change in your medicines.  If you have a migraine: ? Avoid things that make your symptoms worse, such as bright lights. ? It may help to lie down in a dark, quiet room. ? Do not drive or use heavy machinery. ? Ask your doctor what activities are safe for you.  Keep all follow-up visits as told by your doctor. This is important. Contact a doctor if:  You get a migraine that is different or worse than your usual migraines. Get help right away if:  Your migraine gets very bad.  You have a fever.  You have a stiff neck.  You have trouble seeing.  Your muscles feel weak or like you cannot control them.  You start to lose your balance a lot.  You start to have trouble walking.  You pass out (faint). This information is not intended to replace advice given to you by your health care provider. Make sure you discuss any questions you have with your health care provider. Document Released: 11/07/2007 Document Revised: 08/18/2015 Document Reviewed: 07/17/2015 Elsevier Interactive Patient Education  2017 [employer].

## 2016-11-21 MED ORDER — CARVEDILOL 6.25 MG PO TABS: 6.2500 mg | ORAL_TABLET | Freq: Two times a day (BID) | ORAL | 1 refills | Status: DC

## 2016-11-21 MED ORDER — SUMATRIPTAN SUCCINATE 25 MG PO TABS: 25.0000 mg | ORAL_TABLET | Freq: Once | ORAL | 0 refills | Status: DC | PRN

## 2016-11-21 NOTE — Addendum Note (Signed)
Addended by: Lorenso Courier on: 11/21/2016 09:12 AM   Modules accepted: Orders

## 2016-11-22 NOTE — Progress Notes (Signed)
Internal Medicine Clinic Attending  I saw and evaluated the patient.  I personally confirmed the key portions of the history and exam documented by Dr. Chundi and I reviewed pertinent patient test results.  The assessment, diagnosis, and plan were formulated together and I agree with the documentation in the resident's note. 

## 2016-12-24 ENCOUNTER — Other Ambulatory Visit: Payer: Self-pay

## 2016-12-24 ENCOUNTER — Encounter: Payer: Self-pay | Admitting: Internal Medicine

## 2016-12-24 ENCOUNTER — Ambulatory Visit (INDEPENDENT_AMBULATORY_CARE_PROVIDER_SITE_OTHER): Payer: BLUE CROSS/BLUE SHIELD | Admitting: Internal Medicine

## 2016-12-24 ENCOUNTER — Encounter (INDEPENDENT_AMBULATORY_CARE_PROVIDER_SITE_OTHER): Payer: Self-pay

## 2016-12-24 DIAGNOSIS — I10 Essential (primary) hypertension: Secondary | ICD-10-CM

## 2016-12-24 DIAGNOSIS — G43119 Migraine with aura, intractable, without status migrainosus: Secondary | ICD-10-CM

## 2016-12-24 MED ORDER — AMLODIPINE BESYLATE 10 MG PO TABS: 10.0000 mg | ORAL_TABLET | Freq: Every day | ORAL | 0 refills | Status: DC

## 2016-12-24 MED ORDER — AMLODIPINE BESYLATE 10 MG PO TABS: 10.0000 mg | ORAL_TABLET | Freq: Every day | ORAL | 11 refills | Status: DC

## 2016-12-24 NOTE — Assessment & Plan Note (Addendum)
BP Readings from Last 3 Encounters:  12/24/16 (!) 154/77  11/20/16 (!) 148/80  10/24/16 130/82    Lab Results  Component Value Date   NA 142 09/24/2016   K 4.4 09/24/2016   CREATININE 0.65 09/24/2016   BP today is 154/77 with HR 81  She is currently prescribed amlodipine 10 mg daily, spironolactone 100 mg daily, and HCTZ 50 mg daily. She was started on Carvedilol 6.25 mg bid at last visit (hx of migraines and palpitations). She reports that she takes 3 pills every day (100 mg, 50 mg and the new medication - coreg but only daily not bid).   Reports her BP at home runs in the 150s/70-80s. Still having migraine headaches and reports she also has headaches when her BP is elevated. No chest pain. Does report some SOB with migraines but none otherwise.   Her pharmacy shows that she last filled the amlodipine and HCTZ in May 2018 and the spironolactone in April 2018. She is adamant that she is taking her medications despite this.   A/P: HTN likely secondary to medication non-compliance. Will stop spironolactone and HCTZ. Re-start amlodipine 10 mg daily. Instructed to take coreg 6.25 mg bid instead of daily. Will give 3 month trial to see if this helps migraines as well.  Follow up in 2 weeks.

## 2016-12-24 NOTE — Progress Notes (Addendum)
Spoke to pharmacist at [facility] for refill history: amlodipine and HCTZ last filled in May 2018; carvedilol last filled 11/22/16, spironolactone was April 2018.

## 2016-12-24 NOTE — Progress Notes (Signed)
Internal Medicine Clinic Attending  Case discussed with Dr. Boswell soon after the resident saw the patient.  We reviewed the resident's history and exam and pertinent patient test results.  I agree with the assessment, diagnosis, and plan of care documented in the resident's note. 

## 2016-12-24 NOTE — Progress Notes (Signed)
   CC: HTN and migraine HA follow up  HPI:  [name] is a 36 y.o. female with a past medical history listed below here today for follow up of her HTN and migraines.  For details of today's visit and the status of her chronic medical issues please refer to the assessment and plan.   Past Medical History:  Diagnosis Date  . Anemia   . Asthma   . Hypertension   . Obesity    Review of Systems:   No chest pain or shortness of breath  Physical Exam:  Vitals:   12/24/16 0918  BP: (!) 154/77  Pulse: 81  Temp: 97.8 F (36.6 C)  TempSrc: Oral  SpO2: 98%  Weight: (!) 316 lb 14.4 oz (143.7 kg)  Height: 5\' 6"  (1.676 m)   Physical Exam  Constitutional: She is oriented to person, place, and time and well-developed, well-nourished, and in no distress.  Obese female  HENT:  Head: Normocephalic and atraumatic.  Eyes: EOM are normal. Pupils are equal, round, and reactive to light.  Neck: Normal range of motion.  Cardiovascular: Normal rate, regular rhythm and normal heart sounds.  Pulmonary/Chest: Effort normal and breath sounds normal.  Abdominal: Soft. Bowel sounds are normal.  Neurological: She is alert and oriented to person, place, and time. No cranial nerve deficit.  Skin: Skin is warm and dry.  Psychiatric: Mood and affect normal.     Assessment & Plan:   See Encounters Tab for problem based charting.  Patient discussed with Dr. Criselda Peaches

## 2016-12-24 NOTE — Patient Instructions (Addendum)
Victoria Booth,   I would like you to continue the Coreg but please start taking it twice a day. Please stop your other medications and start taking the new medication I am sending in called amlodipine 10 mg daily. Continue to use the sumatriptan as needed if you start to have migraines come on. Instead of the Tylenol please try Excedrin migraine.   Please follow up with me in 2 weeks (the week after Thanksgiving)

## 2016-12-24 NOTE — Assessment & Plan Note (Addendum)
Patient with recent diagnosis of migraine headaches 10/2016. Has reported photophobia, phonophobia and nausea/vomiting associated with frontal headaches. She was started on carvedilol 6.25 mg bid for migraine prophylaxis at 10/10 visit as well as sumatriptan 25 mg prn.   Reports she has used the sumatriptan 6-7 times in the last month. She reports that it helps some if she lays down with it. Reports that it makes her have palpitations. No chest pain. Does note some shortness of breath. Reports she feels anxious. Reports blurry vision, photophobia and phonophobia. Episodes last for hours with nausea but not emesis. Reports episodes are becoming more frequent and more intense. Reports headaches are now starting in her neck bilaterally and progressing frontally.   Exam unremarkable. No papilledema appreciated.  A/P: Given her age, gender, and body habitus consideration of pseudotumor cerebri however no papilledema appreciated on exam today. Symptoms consistent with migraine headaches though symptoms recently suggest tension headaches developing into migraine headaches.   Recommend continuation of sumatriptan prn. Excedrin migraine instead of tylenol. Continue trial of coreg (instructed to take bid instead of daily).

## 2017-02-10 ENCOUNTER — Encounter (HOSPITAL_COMMUNITY): Payer: Self-pay | Admitting: Emergency Medicine

## 2017-02-10 ENCOUNTER — Other Ambulatory Visit: Payer: Self-pay

## 2017-02-10 DIAGNOSIS — I10 Essential (primary) hypertension: Secondary | ICD-10-CM | POA: Diagnosis not present

## 2017-02-10 DIAGNOSIS — J45909 Unspecified asthma, uncomplicated: Secondary | ICD-10-CM | POA: Insufficient documentation

## 2017-02-10 DIAGNOSIS — R1114 Bilious vomiting: Secondary | ICD-10-CM | POA: Insufficient documentation

## 2017-02-10 DIAGNOSIS — R1032 Left lower quadrant pain: Secondary | ICD-10-CM | POA: Diagnosis not present

## 2017-02-10 DIAGNOSIS — Z79899 Other long term (current) drug therapy: Secondary | ICD-10-CM | POA: Insufficient documentation

## 2017-02-10 DIAGNOSIS — R1111 Vomiting without nausea: Secondary | ICD-10-CM | POA: Diagnosis not present

## 2017-02-10 DIAGNOSIS — E860 Dehydration: Secondary | ICD-10-CM | POA: Diagnosis not present

## 2017-02-10 DIAGNOSIS — R110 Nausea: Secondary | ICD-10-CM | POA: Diagnosis not present

## 2017-02-10 DIAGNOSIS — Z87891 Personal history of nicotine dependence: Secondary | ICD-10-CM | POA: Insufficient documentation

## 2017-02-10 DIAGNOSIS — R109 Unspecified abdominal pain: Secondary | ICD-10-CM | POA: Diagnosis not present

## 2017-02-10 DIAGNOSIS — R1110 Vomiting, unspecified: Secondary | ICD-10-CM | POA: Diagnosis not present

## 2017-02-10 LAB — COMPREHENSIVE METABOLIC PANEL
ALK PHOS: 91 U/L (ref 38–126)
ALT: 19 U/L (ref 14–54)
AST: 21 U/L (ref 15–41)
Albumin: 3.5 g/dL (ref 3.5–5.0)
Anion gap: 12 (ref 5–15)
BUN: 8 mg/dL (ref 6–20)
CALCIUM: 8.9 mg/dL (ref 8.9–10.3)
CHLORIDE: 105 mmol/L (ref 101–111)
CO2: 21 mmol/L — AB (ref 22–32)
CREATININE: 0.66 mg/dL (ref 0.44–1.00)
GFR calc non Af Amer: >60 mL/min (ref >60)
GLUCOSE: 85 mg/dL (ref 65–99)
Potassium: 3.9 mmol/L (ref 3.5–5.1)
SODIUM: 138 mmol/L (ref 135–145)
Total Bilirubin: 0.4 mg/dL (ref 0.3–1.2)
Total Protein: 7.4 g/dL (ref 6.5–8.1)

## 2017-02-10 LAB — CBC
HCT: 33.6 % — ABNORMAL LOW (ref 36.0–46.0)
Hemoglobin: 10.2 g/dL — ABNORMAL LOW (ref 12.0–15.0)
MCH: 19.5 pg — AB (ref 26.0–34.0)
MCHC: 30.4 g/dL (ref 30.0–36.0)
MCV: 64.2 fL — AB (ref 78.0–100.0)
PLATELETS: 324 10*3/uL (ref 150–400)
RBC: 5.23 MIL/uL — AB (ref 3.87–5.11)
RDW: 19.5 % — AB (ref 11.5–15.5)
WBC: 8.6 10*3/uL (ref 4.0–10.5)

## 2017-02-10 LAB — URINALYSIS, ROUTINE W REFLEX MICROSCOPIC
Bilirubin Urine: NEGATIVE
GLUCOSE, UA: NEGATIVE mg/dL
KETONES UR: NEGATIVE mg/dL
Leukocytes, UA: NEGATIVE
Nitrite: NEGATIVE
PH: 5 (ref 5.0–8.0)
Protein, ur: NEGATIVE mg/dL
Specific Gravity, Urine: 1.026 (ref 1.005–1.030)

## 2017-02-10 LAB — I-STAT BETA HCG BLOOD, ED (MC, WL, AP ONLY): I-stat hCG, quantitative: <5 m[IU]/mL (ref <5)

## 2017-02-10 LAB — LIPASE, BLOOD: Lipase: 20 U/L (ref 11–51)

## 2017-02-10 NOTE — ED Triage Notes (Signed)
Pt reports vomiting x3 weeks, reports some left flank pain that began Thursday, denies any urinary symptoms. A/ox4, resp e/u, nad.

## 2017-02-11 ENCOUNTER — Emergency Department (HOSPITAL_COMMUNITY)
Admission: EM | Admit: 2017-02-11 | Discharge: 2017-02-11 | Disposition: A | Discharge status: Home / Self Care | Payer: BLUE CROSS/BLUE SHIELD | Attending: Emergency Medicine | Admitting: Emergency Medicine

## 2017-02-11 ENCOUNTER — Emergency Department (HOSPITAL_COMMUNITY): Payer: BLUE CROSS/BLUE SHIELD

## 2017-02-11 DIAGNOSIS — I10 Essential (primary) hypertension: Secondary | ICD-10-CM | POA: Diagnosis not present

## 2017-02-11 DIAGNOSIS — E860 Dehydration: Secondary | ICD-10-CM | POA: Diagnosis not present

## 2017-02-11 DIAGNOSIS — R1114 Bilious vomiting: Secondary | ICD-10-CM

## 2017-02-11 DIAGNOSIS — Z79899 Other long term (current) drug therapy: Secondary | ICD-10-CM | POA: Diagnosis not present

## 2017-02-11 DIAGNOSIS — R109 Unspecified abdominal pain: Secondary | ICD-10-CM | POA: Diagnosis not present

## 2017-02-11 DIAGNOSIS — J45909 Unspecified asthma, uncomplicated: Secondary | ICD-10-CM | POA: Diagnosis not present

## 2017-02-11 DIAGNOSIS — R1111 Vomiting without nausea: Secondary | ICD-10-CM | POA: Diagnosis not present

## 2017-02-11 DIAGNOSIS — R1110 Vomiting, unspecified: Secondary | ICD-10-CM | POA: Diagnosis not present

## 2017-02-11 DIAGNOSIS — R1032 Left lower quadrant pain: Secondary | ICD-10-CM | POA: Diagnosis not present

## 2017-02-11 DIAGNOSIS — Z87891 Personal history of nicotine dependence: Secondary | ICD-10-CM | POA: Diagnosis not present

## 2017-02-11 MED ORDER — SODIUM CHLORIDE 0.9 % IV BOLUS (SEPSIS)
1000.0000 mL | Freq: Once | INTRAVENOUS | Status: AC
  Administered 2017-02-11: 1000 mL via INTRAVENOUS

## 2017-02-11 MED ORDER — KETOROLAC TROMETHAMINE 30 MG/ML IJ SOLN
30.0000 mg | Freq: Once | INTRAMUSCULAR | Status: AC
  Administered 2017-02-11: 30 mg via INTRAVENOUS
  Filled 2017-02-11: qty 1

## 2017-02-11 MED ORDER — PROMETHAZINE HCL 25 MG PO TABS: 25.0000 mg | ORAL_TABLET | Freq: Four times a day (QID) | ORAL | 0 refills | Status: DC | PRN

## 2017-02-11 MED ORDER — ONDANSETRON HCL 4 MG/2ML IJ SOLN
4.0000 mg | Freq: Once | INTRAMUSCULAR | Status: AC
  Administered 2017-02-11: 4 mg via INTRAVENOUS
  Filled 2017-02-11: qty 2

## 2017-02-11 NOTE — ED Notes (Signed)
2 x RN attempted IV start; IV team consulted

## 2017-02-11 NOTE — ED Provider Notes (Signed)
[facility] EMERGENCY DEPARTMENT Provider Note   CSN: [id number] Arrival date & time: 02/10/17  1724     History   Chief Complaint Chief Complaint  Patient presents with  . Emesis    HPI Victoria Booth is a 37 y.o. female.  HPI Patient presents to the emergency department with vomiting times 3 weeks.  The patient states she is also had some left flank pain that began Thursday.  The patient states that nothing seems to make the condition better or worse.  Patient states that the flank pain began on Thursday.  She states that she does have some lower back pain but no urinary symptoms.  The patient denies chest pain, shortness of breath, headache,blurred vision, neck pain, fever, cough, weakness, numbness, dizziness, anorexia, edema,rash, back pain, dysuria, hematemesis, bloody stool, near syncope, or syncope. Past Medical History:  Diagnosis Date  . Anemia   . Asthma   . Hypertension   . Obesity     Patient Active Problem List   Diagnosis Date Noted  . Migraine 10/24/2016  . Hematuria 09/24/2016  . Prediabetes 08/21/2016  . Lumbar back pain 08/21/2016  . Allergic rhinitis 06/18/2016  . Left flank pain 06/05/2016  . Obesity, morbid, BMI 50 or higher (HCC) 06/05/2016  . PCOS (polycystic ovarian syndrome) 04/05/2016  . Essential hypertension, benign 12/01/2014  . Asthma, chronic 12/01/2014  . Microcytic anemia 12/01/2014  . Preventative health care 12/01/2014    Past Surgical History:  Procedure Laterality Date  . TOENAIL EXCISION      OB History    No data available       Home Medications    Prior to Admission medications   Medication Sig Start Date End Date Taking? Authorizing Provider  albuterol (PROVENTIL HFA;VENTOLIN HFA) 108 (90 Base) MCG/ACT inhaler Inhale 1-2 puffs into the lungs every 6 (six) hours as needed for wheezing or shortness of breath. 05/01/16  Yes Domenick Gong, MD  amLODipine (NORVASC) 10 MG tablet Take 1 tablet (10  mg total) daily by mouth. 12/24/16 12/24/17 Yes Valentino Nose, MD  carvedilol (COREG) 6.25 MG tablet Take 1 tablet (6.25 mg total) by mouth 2 (two) times daily. 11/21/16 02/11/17 Yes Chundi, Vahini, MD  mometasone (NASONEX) 50 MCG/ACT nasal spray Place 2 sprays into the nose daily. Patient not taking: Reported on 02/11/2017 05/01/16   Domenick Gong, MD  montelukast (SINGULAIR) 10 MG tablet Take 1 tablet (10 mg total) by mouth daily. Patient not taking: Reported on 02/11/2017 06/18/16 06/18/17  Reymundo Poll, MD  SUMAtriptan (IMITREX) 25 MG tablet Take 1 tablet (25 mg total) by mouth once as needed for migraine. May repeat in 2 hours if headache persists or recurs. Patient not taking: Reported on 02/11/2017 11/21/16 02/12/23  Lorenso Courier, MD    Family History Family History  Problem Relation Age of Onset  . Diabetes Mother   . Hypertension Mother     Social History Social History   Tobacco Use  . Smoking status: Former Smoker    Packs/day: 0.10    Types: Cigarettes    Last attempt to quit: 04/06/2015    Years since quitting: 1.8  . Smokeless tobacco: Never Used  . Tobacco comment: 1 cig / month.  Substance Use Topics  . Alcohol use: No    Alcohol/week: 0.0 oz  . Drug use: No     Allergies   Penicillins; Shellfish allergy; Sulfa antibiotics; and Iodine   Review of Systems Review of Systems All other systems negative  except as documented in the HPI. All pertinent positives and negatives as reviewed in the HPI.  Physical Exam Updated Vital Signs BP 128/84   Pulse 78   Temp 98 F (36.7 C)   Resp 18   LMP 01/08/2017 (Exact Date)   SpO2 98%   Physical Exam  Constitutional: She is oriented to person, place, and time. She appears well-developed and well-nourished. No distress.  HENT:  Head: Normocephalic and atraumatic.  Mouth/Throat: Oropharynx is clear and moist.  Eyes: Pupils are equal, round, and reactive to light.  Neck: Normal range of motion. Neck supple.    Cardiovascular: Normal rate, regular rhythm and normal heart sounds. Exam reveals no gallop and no friction rub.  No murmur heard. Pulmonary/Chest: Effort normal and breath sounds normal. No respiratory distress. She has no wheezes.  Abdominal: Soft. Bowel sounds are normal. She exhibits no distension. There is no tenderness.  Neurological: She is alert and oriented to person, place, and time. She exhibits normal muscle tone. Coordination normal.  Skin: Skin is warm and dry. Capillary refill takes less than 2 seconds. No rash noted. No erythema.  Psychiatric: She has a normal mood and affect. Her behavior is normal.  Nursing note and vitals reviewed.    ED Treatments / Results  Labs (all labs ordered are listed, but only abnormal results are displayed) Labs Reviewed  COMPREHENSIVE METABOLIC PANEL - Abnormal; Notable for the following components:      Result Value   CO2 21 (*)    All other components within normal limits  CBC - Abnormal; Notable for the following components:   RBC 5.23 (*)    Hemoglobin 10.2 (*)    HCT 33.6 (*)    MCV 64.2 (*)    MCH 19.5 (*)    RDW 19.5 (*)    All other components within normal limits  URINALYSIS, ROUTINE W REFLEX MICROSCOPIC - Abnormal; Notable for the following components:   Hgb urine dipstick MODERATE (*)    Bacteria, UA RARE (*)    Squamous Epithelial / LPF 0-5 (*)    All other components within normal limits  LIPASE, BLOOD  I-STAT BETA HCG BLOOD, ED ([facility], [facility], [facility] ONLY)    EKG  EKG Interpretation None       Radiology Ct Renal Stone Study  Result Date: 02/11/2017 CLINICAL DATA:  Subacute onset of vomiting. Left flank pain. Left lower quadrant and epigastric abdominal pain. EXAM: CT ABDOMEN AND PELVIS WITHOUT CONTRAST TECHNIQUE: Multidetector CT imaging of the abdomen and pelvis was performed following the standard protocol without IV contrast. COMPARISON:  CT of the abdomen and pelvis performed 06/14/2016, and pelvic ultrasound  performed 06/17/2016 FINDINGS: Lower chest: The visualized lung bases are grossly clear. The visualized portions of the mediastinum are unremarkable. Hepatobiliary: The liver is unremarkable in appearance. The gallbladder is unremarkable in appearance. The common bile duct remains normal in caliber. Pancreas: The pancreas is within normal limits. Spleen: The spleen is unremarkable in appearance. Adrenals/Urinary Tract: The adrenal glands are unremarkable in appearance. Nonobstructing bilateral renal stones measure up to 6 mm in size. There is no evidence of hydronephrosis. No obstructing ureteral stones are identified. No perinephric stranding is seen. Stomach/Bowel: The stomach is unremarkable in appearance. The small bowel is within normal limits. The appendix is normal in caliber, without evidence of appendicitis. The colon is unremarkable in appearance. Vascular/Lymphatic: The abdominal aorta is unremarkable in appearance. The inferior vena cava is grossly unremarkable. No retroperitoneal lymphadenopathy is seen. No pelvic  sidewall lymphadenopathy is identified. Reproductive: The bladder is mildly distended and grossly unremarkable. The uterus is grossly unremarkable, though not fully characterized without contrast. No suspicious adnexal masses are seen. The ovaries are relatively symmetric. Other: No additional soft tissue abnormalities are seen. Musculoskeletal: No acute osseous abnormalities are identified. The visualized musculature is unremarkable in appearance. IMPRESSION: 1. No acute abnormality seen within the abdomen or pelvis. 2. Nonobstructing bilateral renal stones measure up to 6 mm in size. Electronically Signed   By: Roanna Raider M.D.   On: 02/11/2017 02:50    Procedures Procedures (including critical care time)  Medications Ordered in ED Medications  sodium chloride 0.9 % bolus 1,000 mL (1,000 mLs Intravenous New Bag/Given 02/11/17 0501)  ondansetron (ZOFRAN) injection 4 mg (4 mg  Intravenous Given 02/11/17 0500)  ketorolac (TORADOL) 30 MG/ML injection 30 mg (30 mg Intravenous Given 02/11/17 0510)     Initial Impression / Assessment and Plan / ED Course  I have reviewed the triage vital signs and the nursing notes.  Pertinent labs & imaging results that were available during my care of the patient were reviewed by me and considered in my medical decision making (see chart for details).     Patient is advised that she may have passed a kidney stone.  I do not know that the nausea and vomiting over the last few weeks would be explained by this.  I advised the patient to return here as needed we will give patient antiemetics for home.  Told to return for any worsening in her condition.  Patient agrees the plan and all questions were answered.  Patient is given IV fluids as well.  Final Clinical Impressions(s) / ED Diagnoses   Final diagnoses:  None    ED Discharge Orders    None       Charlestine Night, PA-C 02/11/17 0544    Ward, Layla Maw, DO 02/11/17 [phone]

## 2017-02-11 NOTE — Discharge Instructions (Signed)
Slowly increase her fluid intake.  Rest as much as possible.  Follow-up with your primary doctor.

## 2017-02-12 ENCOUNTER — Other Ambulatory Visit: Payer: Self-pay

## 2017-02-12 ENCOUNTER — Encounter: Payer: Self-pay | Admitting: Internal Medicine

## 2017-02-12 ENCOUNTER — Ambulatory Visit: Payer: BLUE CROSS/BLUE SHIELD | Admitting: Internal Medicine

## 2017-02-12 VITALS — BP 168/109 | HR 73 | Temp 97.6°F | Ht 66.0 in | Wt 316.9 lb

## 2017-02-12 DIAGNOSIS — I10 Essential (primary) hypertension: Secondary | ICD-10-CM | POA: Diagnosis not present

## 2017-02-12 DIAGNOSIS — Z6841 Body Mass Index (BMI) 40.0 and over, adult: Secondary | ICD-10-CM | POA: Diagnosis not present

## 2017-02-12 DIAGNOSIS — N200 Calculus of kidney: Secondary | ICD-10-CM | POA: Diagnosis not present

## 2017-02-12 DIAGNOSIS — Z79899 Other long term (current) drug therapy: Secondary | ICD-10-CM | POA: Diagnosis not present

## 2017-02-12 DIAGNOSIS — E669 Obesity, unspecified: Secondary | ICD-10-CM

## 2017-02-12 DIAGNOSIS — Z87891 Personal history of nicotine dependence: Secondary | ICD-10-CM | POA: Diagnosis not present

## 2017-02-12 DIAGNOSIS — R109 Unspecified abdominal pain: Secondary | ICD-10-CM

## 2017-02-12 MED ORDER — ACETAMINOPHEN 500 MG PO TABS: 1000.0000 mg | ORAL_TABLET | Freq: Three times a day (TID) | ORAL | 0 refills | Status: AC | PRN

## 2017-02-12 MED ORDER — ONDANSETRON 4 MG PO TBDP: 4.0000 mg | ORAL_TABLET | Freq: Three times a day (TID) | ORAL | 0 refills | Status: DC | PRN

## 2017-02-12 NOTE — Assessment & Plan Note (Addendum)
Assessment She was just seen in the ED yesterday for abdominal pain, nausea and vomiting. She had a CT renal stone study that showed a nonobstructing left kidney stone but was otherwise unremarkable. She returns to clinic today complaining of continued lower abdominal pain, nausea, vomiting, and diarrhea for the last 3 weeks. She states that she has had nonbloody emesis 3 and 5 episodes of loose watery bloody stool in the last 24 hours. She denies any recent changes in her diet, recent travel, or sick contacts. She states she has not been able to take food in but she has been able to keep water and brought down. She states that she has not been able to take her blood pressure medications because of this. She denies fevers, dysuria or leg swelling. She has a history of kidney stones, however she states this pain is not similar. Unknown etiology of abdominal pain and N/V/D, possibly secondary to bacterial pathogen. She does have a nonobstructing renal stone on her CT however this would not explain her diarrhea. She does state that she is able to keep down fluids and broth.  Plan - GI pathogen panel  - Will try acetaminophen 1000 mg every 8 hours as needed for pain - Zofran 4 mg ODT every 8 hours as needed for nausea - Patient advised that if she develops fevers or dysuria, to call the clinic or to present to the ED.

## 2017-02-12 NOTE — Progress Notes (Signed)
   CC: ED Follow-up of kidney stones and blood pressure management  HPI:  Ms.Lamyah A Friedlander is a 37 y.o. with PMH of hypertension who presents for ED follow-up of kidney stones and blood pressure management.  She was just seen seen in the ED yesterday for abdominal pain, nausea, and vomiting. She had a CT renal stone study which showed a nonobstructing left kidney stone. Otherwise it was unremarkable. She states that she was told to come follow up with her PCP and that she was not given any medications for the pain or nausea.  Past Medical History:  Diagnosis Date  . Anemia   . Asthma   . Hypertension   . Obesity    Review of Systems:   Constitutional: Negative for diaphoresis, malaise/fatigue, and weight loss, fevers. HEENT: Negative for blurred vision, hearing loss, sinus pain, congestion, and sore throat. Respiratory: Negative for shortness of breath and wheezing. Cardiovascular: Negative for chest pain, palpitations, leg swelling. Gastrointestinal:. Positive for abdominal pain, diarrhea, nausea, vomiting and bloody stool. Genitourinary: Negative for dysuria and hematuria.  Musculoskeletal: Negative for joint pain and myalgias. Neurological: Negative for dizziness, focal weakness, weakness and headaches.  Physical Exam:  Vitals:   02/12/17 1111  BP: (!) 168/109  Pulse: 73  Temp: 97.6 F (36.4 C)  TempSrc: Oral  SpO2: 99%  Weight: (!) 316 lb 14.4 oz (143.7 kg)  Height: 5\' 6"  (1.676 m)   GEN: Well-appearing, well-nourished. Alert and oriented. No acute distress.  HENT: [place]/AT. Moist mucous membranes. No visible lesions. EYES: PERRL. Sclera non-icteric. Conjunctiva clear. RESP: Clear to auscultation bilaterally. No wheezes, rales, or rhonchi. No increased work of breathing. CV: Normal rate and regular rhythm. No murmurs, gallops, or rubs. No LE edema. ABD: Soft. Tender to palpation in epigastrium, left abdomen, and lower abdomen. Decreased bowel sounds. Non-distended.  Obese. No rebound or guarding. EXT: No edema. Warm and well perfused. BACK: Negative for CVA tenderness. Positive for tenderness to palpation over left flank. NEURO: Cranial nerves II-XII grossly intact. Able to lift all four extremities against gravity. No apparent audiovisual hallucinations. Speech fluent and appropriate. PSYCH: Patient is intermittently tearful. Appropriate affect. Well-groomed; speech is appropriate and on-subject.  Assessment & Plan:   See Encounters Tab for problem based charting.  Patient seen with Dr. Heide Spark

## 2017-02-12 NOTE — Assessment & Plan Note (Addendum)
Assessment She was previously seen on November 13, at which time her elevated blood pressure was thought to be due to medication noncompliance. Her spironolactone and HCTZ were stopped. She was restarted on amlodipine 10 mg daily and carvedilol 6.25 mg twice a day. Her blood pressure today is 168/109. She states that she did not know she was supposed to take amlodipine. She has not been able to take the carvedilol due to nausea and vomiting. Her elevated blood pressure today is likely secondary to not being up to take her medications.  Plan - Continue carvedilol 6.25 mg twice a day. - Instructed patient to start taking amlodipine 10 mg daily - Follow up in 1 month with PCP.

## 2017-02-12 NOTE — Patient Instructions (Addendum)
FOLLOW-UP INSTRUCTIONS When: 1 month For: follow up of blood pressure and pain What to bring: medications   Victoria Booth,  It was a pleasure to meet you today.  Please pick up amlodipine 10mg  from your pharmacy. Please start taking one pill every day for your blood pressure.  We are collecting GI bacteria panel to see whether you might have an infection. We will call you with the results. Please use zofran every 8 hours as needed for your nausea. Please also take tylenol 1000mg  3 times daily as needed for pain.  If you start developing fevers, pain with urination, or worsening pain, please come back to the clinic or go to the emergency room.

## 2017-02-13 ENCOUNTER — Other Ambulatory Visit: Payer: BLUE CROSS/BLUE SHIELD

## 2017-02-13 DIAGNOSIS — R109 Unspecified abdominal pain: Secondary | ICD-10-CM | POA: Diagnosis not present

## 2017-02-14 LAB — SPECIMEN STATUS REPORT

## 2017-02-14 NOTE — Progress Notes (Signed)
Internal Medicine Clinic Attending  I saw and evaluated the patient.  I personally confirmed the key portions of the history and exam documented by Dr. Huang and I reviewed pertinent patient test results.  The assessment, diagnosis, and plan were formulated together and I agree with the documentation in the resident's note.  

## 2017-02-17 LAB — GI PROFILE, STOOL, PCR
ADENOVIRUS F 40/41: NOT DETECTED
ASTROVIRUS: NOT DETECTED
C difficile toxin A/B: NOT DETECTED
CYCLOSPORA CAYETANENSIS: NOT DETECTED
Campylobacter: NOT DETECTED
Cryptosporidium: NOT DETECTED
ENTAMOEBA HISTOLYTICA: NOT DETECTED
ENTEROPATHOGENIC E COLI: NOT DETECTED
Enteroaggregative E coli: NOT DETECTED
Enterotoxigenic E coli: NOT DETECTED
GIARDIA LAMBLIA: NOT DETECTED
Norovirus GI/GII: NOT DETECTED
Plesiomonas shigelloides: NOT DETECTED
Rotavirus A: NOT DETECTED
SAPOVIRUS: NOT DETECTED
Salmonella: NOT DETECTED
Shiga-toxin-producing E coli: NOT DETECTED
Shigella/Enteroinvasive E coli: NOT DETECTED
VIBRIO CHOLERAE: NOT DETECTED
VIBRIO: NOT DETECTED
Yersinia enterocolitica: NOT DETECTED

## 2017-02-25 ENCOUNTER — Ambulatory Visit: Payer: BLUE CROSS/BLUE SHIELD

## 2017-02-28 ENCOUNTER — Encounter: Payer: Self-pay | Admitting: Internal Medicine

## 2017-02-28 ENCOUNTER — Ambulatory Visit: Payer: BLUE CROSS/BLUE SHIELD

## 2017-02-28 ENCOUNTER — Telehealth: Payer: Self-pay | Admitting: Internal Medicine

## 2017-02-28 NOTE — Telephone Encounter (Signed)
PT WANTS RESULTS FROM TESTS

## 2017-03-06 MED ORDER — AMLODIPINE BESYLATE 10 MG PO TABS: 10.0000 mg | ORAL_TABLET | Freq: Every day | ORAL | 1 refills | Status: DC

## 2017-03-06 NOTE — Addendum Note (Signed)
Addended by: Pearletha Alfred on: 03/06/2017 04:29 PM   Modules accepted: Orders

## 2017-03-06 NOTE — Telephone Encounter (Signed)
Spoke to patient over phone to let her know about negative GI pathogen panel. She reports continued abdominal pain, N/V. I offered a clinic appointment soon for further evaluation, however she states that she has limited ability to leave from work. She has an appointment scheduled for 2/8 with PCP and states that she requested time off from her work but is unsure whether this will go through. I advised that if she starts to develop fevers, inability to tolerate PO intake, or worsening pain, that she go to the emergency room.  She also states she has not been able to take her BP medication (amlodipine 10mg  daily) because it is too expensive with her current insurance. I informed her that she can pick this medication up from [facility] for $16 for a 90 day supply. She is agreeable to this plan. I have sent a prescription to [facility] on [address].

## 2017-03-21 ENCOUNTER — Other Ambulatory Visit: Payer: Self-pay

## 2017-03-21 ENCOUNTER — Ambulatory Visit (INDEPENDENT_AMBULATORY_CARE_PROVIDER_SITE_OTHER): Payer: BLUE CROSS/BLUE SHIELD | Admitting: Internal Medicine

## 2017-03-21 VITALS — BP 155/89 | HR 94 | Wt 317.2 lb

## 2017-03-21 DIAGNOSIS — J45909 Unspecified asthma, uncomplicated: Secondary | ICD-10-CM

## 2017-03-21 DIAGNOSIS — I10 Essential (primary) hypertension: Secondary | ICD-10-CM

## 2017-03-21 DIAGNOSIS — Z87891 Personal history of nicotine dependence: Secondary | ICD-10-CM | POA: Diagnosis not present

## 2017-03-21 DIAGNOSIS — Z79899 Other long term (current) drug therapy: Secondary | ICD-10-CM

## 2017-03-21 DIAGNOSIS — G43119 Migraine with aura, intractable, without status migrainosus: Secondary | ICD-10-CM

## 2017-03-21 DIAGNOSIS — F329 Major depressive disorder, single episode, unspecified: Secondary | ICD-10-CM | POA: Diagnosis not present

## 2017-03-21 DIAGNOSIS — Z0000 Encounter for general adult medical examination without abnormal findings: Secondary | ICD-10-CM

## 2017-03-21 DIAGNOSIS — G43909 Migraine, unspecified, not intractable, without status migrainosus: Secondary | ICD-10-CM

## 2017-03-21 DIAGNOSIS — E282 Polycystic ovarian syndrome: Secondary | ICD-10-CM

## 2017-03-21 DIAGNOSIS — F321 Major depressive disorder, single episode, moderate: Secondary | ICD-10-CM

## 2017-03-21 MED ORDER — SERTRALINE HCL 50 MG PO TABS: 50.0000 mg | ORAL_TABLET | Freq: Every day | ORAL | 0 refills | Status: DC

## 2017-03-21 NOTE — Patient Instructions (Signed)
It was a pleasure to see you today Victoria Booth. Please make the following changes:  -Please start sertraline 50mg  daily -Please go to counseling to help with mood -Please take your amlodipine and carvedilol for blood pressure and migraine headaches  If you have any questions or concerns, please call our clinic at [phone] between 9am-5pm and after hours call [phone] and ask for the internal medicine resident on call. If you feel you are having a medical emergency please call 911.   Thank you, we look forward to help you remain healthy!  Lorenso Courier, MD Internal Medicine PGY1

## 2017-03-21 NOTE — Progress Notes (Signed)
   CC: Hypertension follow up  HPI:  Victoria Booth is a 37 y.o. female with essential hypertension, migraines, pcos, and chronic asthma. Please see problem based charting for evaluation, assessment, and plan.  Past Medical History:  Diagnosis Date  . Anemia   . Asthma   . Hypertension   . Obesity    Review of Systems:    Decreased appetite, depressed mood, fatigue, headaches  Physical Exam:  Vitals:   03/21/17 1603 03/21/17 1702  BP: (!) 155/136 (!) 155/89  Pulse: 94   SpO2: 100%   Weight: (!) 317 lb 3.2 oz (143.9 kg)    Physical Exam  Constitutional: She appears well-developed and well-nourished. No distress.  HENT:  Head: Normocephalic and atraumatic.  Eyes: Conjunctivae are normal.  Cardiovascular: Normal rate, regular rhythm and normal heart sounds.  Respiratory: Effort normal and breath sounds normal. No respiratory distress. She has no wheezes.  GI: Soft. Bowel sounds are normal. She exhibits no distension. There is no tenderness.  Musculoskeletal: She exhibits no edema.  Neurological: She is alert.  Skin: She is not diaphoretic. No erythema.  Psychiatric: Her speech is normal. Judgment and thought content normal. Her mood appears anxious. She is slowed and withdrawn. She exhibits a depressed mood. She expresses no homicidal and no suicidal ideation.  Tearful throughout exam    Assessment & Plan:   See Encounters Tab for problem based charting.  Patient discussed with Dr. Rogelia Boga

## 2017-03-22 NOTE — Assessment & Plan Note (Signed)
Assessment The patient's blood pressure during this visit was 155/136 and repeat was 155/89. The patient is being prescribed amlodipine 10mg  and carvedilol 6.25mg  bid. The patient states that she has been missing several doses of her medication as she just feels down and does not feel like taking the medication.   The patient's blood pressure is likely remaining uncontrolled due to her noncompliance with medication.   Plan -Encouraged the patient to take amlodipine 10mg  daily and carvedilol 6.25mg  bid -Went over different medication compliance methods such as alarms on phone and getting a family member/friend to help motivate to take medication

## 2017-03-22 NOTE — Assessment & Plan Note (Addendum)
Assessment The patient states that she has been having decreased motivation, anhedonia, decreased concentration, she was tearful throughout exam, decreased sleep, decreased appetite, and irritability. She does not have any homicidal or suicidal ideation. She scored 12 on her PHQ 9 and 19 on GAD screening tools.   The patient was previously engaged and her engagement did not go through. She also seems to have some other personal stressors which she did not wish to discuss.   The patient meets criteria for major depressive disorder and will benefit from an ssri.   Plan -started the patient on sertraline 50mg  as the patient works throughout the day and does not want medication that is sedating also due to its decreased effect in comparison to other ssris on weight.  -referred patient for psychiatric counseling

## 2017-03-22 NOTE — Assessment & Plan Note (Signed)
Assessment The patient has frequent migraines for which she is being prescribed sumatriptan 25mg  prn and carvedilol 6.25mg  bid for prophylaxis. The patient states that she continued to have migraines frequently. She has not been taking carvedilol due to decreased motivation. The patient also states that she has not been exercising and states that she has been sleeping less than 4hrs nightly.   The patient's poor sleep hygiene, decreased amount of exercise, and noncompliance to medication is likely making the patient's migraines worse. Will need to control patient's depression in order to help her take her other medication.   Plan -Encouraged patient to take sumatriptan 25mg  prn and carvedilol 6.25mg  bid for prophylaxis  -Counseled patient on importance of sleep and exercise to decrease frequency of migraines

## 2017-03-22 NOTE — Assessment & Plan Note (Signed)
Pap smear done last year by ob/gyn Influenza vaccine received at work

## 2017-03-24 NOTE — Progress Notes (Signed)
Internal Medicine Clinic Attending  Case discussed with Dr. Chundi at the time of the visit.  We reviewed the resident's history and exam and pertinent patient test results.  I agree with the assessment, diagnosis, and plan of care documented in the resident's note. 

## 2017-03-26 ENCOUNTER — Encounter: Payer: Self-pay | Admitting: Internal Medicine

## 2017-04-08 ENCOUNTER — Other Ambulatory Visit: Payer: Self-pay | Admitting: Internal Medicine

## 2017-04-08 DIAGNOSIS — I10 Essential (primary) hypertension: Secondary | ICD-10-CM

## 2017-04-08 NOTE — Telephone Encounter (Signed)
Refill Request ° ° °   ° amLODipine (NORVASC) 10 MG tablet  ° ° °

## 2017-04-15 ENCOUNTER — Encounter (HOSPITAL_COMMUNITY): Payer: Self-pay | Admitting: Emergency Medicine

## 2017-04-15 ENCOUNTER — Ambulatory Visit (HOSPITAL_COMMUNITY)
Admission: EM | Admit: 2017-04-15 | Discharge: 2017-04-15 | Disposition: A | Discharge status: Home / Self Care | Payer: BLUE CROSS/BLUE SHIELD | Attending: Family Medicine | Admitting: Family Medicine

## 2017-04-15 ENCOUNTER — Ambulatory Visit (INDEPENDENT_AMBULATORY_CARE_PROVIDER_SITE_OTHER): Payer: BLUE CROSS/BLUE SHIELD

## 2017-04-15 DIAGNOSIS — R1032 Left lower quadrant pain: Secondary | ICD-10-CM

## 2017-04-15 DIAGNOSIS — K5900 Constipation, unspecified: Secondary | ICD-10-CM | POA: Diagnosis not present

## 2017-04-15 DIAGNOSIS — R311 Benign essential microscopic hematuria: Secondary | ICD-10-CM

## 2017-04-15 LAB — POCT I-STAT, CHEM 8
BUN: 7 mg/dL (ref 6–20)
CALCIUM ION: 1.12 mmol/L — AB (ref 1.15–1.40)
Chloride: 106 mmol/L (ref 101–111)
Creatinine, Ser: 0.6 mg/dL (ref 0.44–1.00)
GLUCOSE: 93 mg/dL (ref 65–99)
HCT: 33 % — ABNORMAL LOW (ref 36.0–46.0)
Hemoglobin: 11.2 g/dL — ABNORMAL LOW (ref 12.0–15.0)
Potassium: 4.1 mmol/L (ref 3.5–5.1)
SODIUM: 140 mmol/L (ref 135–145)
TCO2: 23 mmol/L (ref 22–32)

## 2017-04-15 LAB — POCT URINALYSIS DIP (DEVICE)
Glucose, UA: NEGATIVE mg/dL
Leukocytes, UA: NEGATIVE
NITRITE: NEGATIVE
PH: 5.5 (ref 5.0–8.0)
Protein, ur: 30 mg/dL — AB
Specific Gravity, Urine: >=1.03 (ref 1.005–1.030)
UROBILINOGEN UA: 0.2 mg/dL (ref 0.0–1.0)

## 2017-04-15 MED ORDER — LIDOCAINE VISCOUS 2 % MT SOLN: 5.0000 mL | OROMUCOSAL | 0 refills | Status: DC | PRN

## 2017-04-15 MED ORDER — DOCUSATE SODIUM 250 MG PO CAPS: 250.0000 mg | ORAL_CAPSULE | Freq: Every day | ORAL | 0 refills | Status: DC

## 2017-04-15 MED ORDER — CIPROFLOXACIN HCL 500 MG PO TABS: 500.0000 mg | ORAL_TABLET | Freq: Two times a day (BID) | ORAL | 0 refills | Status: AC

## 2017-04-15 NOTE — ED Triage Notes (Addendum)
PT reports LLQ abdominal pain with LUQ pain and left low back pain as well. Pain started Friday.   PT noticed blood in urine today.   PT had vomiting on Friday and Sunday.   Diarrhea started Saturday and has continued through today. 3x diarrhea today.   PT reports an unquenchable thirst and increased urination since Saturday.

## 2017-04-15 NOTE — Discharge Instructions (Signed)
Please drink lots of water and take the Colace as prescribed.  Start you on Cipro to cover for UTI.  If your pain is not getting better in the next 2-3 days then I would like you to call over to [facility] or see your primary for further workup of a GYN etiology for this pain.

## 2017-04-15 NOTE — ED Provider Notes (Signed)
04/15/2017 7:39 PM   DOB: [birth year] / MRN: [id number]  SUBJECTIVE:  Victoria Booth is a 37 y.o. female presenting for left-sided abdominal pain.  Symptoms have been present now for about 4 days.  She states "I am taking way too much ibuprofen."  She is allergic to penicillins; shellfish allergy; sulfa antibiotics; and iodine.   She  has a past medical history of Anemia, Asthma, Hypertension, and Obesity.    She  reports that she quit smoking about 2 years ago. Her smoking use included cigarettes. She smoked 0.10 packs per day. she has never used smokeless tobacco. She reports that she does not drink alcohol or use drugs. She  has no sexual activity history on file. The patient  has a past surgical history that includes toenail excision.  Her family history includes Diabetes in her mother; Hypertension in her mother.  Review of Systems  Constitutional: Negative for chills, diaphoresis and fever.  Respiratory: Negative for shortness of breath.   Cardiovascular: Negative for chest pain, orthopnea and leg swelling.  Gastrointestinal: Negative for abdominal pain, blood in stool, constipation, diarrhea, heartburn, melena, nausea and vomiting.  Genitourinary: Negative for flank pain.  Skin: Negative for rash.  Neurological: Negative for dizziness. Seizures:      OBJECTIVE:  BP (!) 155/109   Pulse 63   Temp 97.6 F (36.4 C) (Oral)   Resp 16   Wt (!) 308 lb (139.7 kg)   SpO2 100%   BMI 49.71 kg/m   Rechecked manually by me at 145/93.   Physical Exam  Constitutional: She is active.  Non-toxic appearance.  Cardiovascular: Normal rate, regular rhythm, S1 normal, S2 normal, normal heart sounds and intact distal pulses. Exam reveals no gallop, no friction rub and no decreased pulses.  No murmur heard. Pulmonary/Chest: Effort normal. No stridor. No tachypnea. No respiratory distress. She has no wheezes. She has no rales.  Abdominal: She exhibits no distension and no mass. There is  tenderness (Left lower quadrant). There is no rebound and no guarding.  Genitourinary:  Genitourinary Comments: Exquisite tenderness with speculum examination.  The exam could not be completed secondary to tenderness.  The vaginal vault was negative for bleeding.  Musculoskeletal: She exhibits no edema, tenderness or deformity.  Neurological: She is alert.  Skin: Skin is warm and dry. She is not diaphoretic. No pallor.    Results for orders placed or performed during the hospital encounter of 04/15/17 (from the past 72 hour(s))  POCT urinalysis dip (device)     Status: Abnormal   Collection Time: 04/15/17  6:15 PM  Result Value Ref Range   Glucose, UA NEGATIVE NEGATIVE mg/dL   Bilirubin Urine SMALL (A) NEGATIVE   Ketones, ur TRACE (A) NEGATIVE mg/dL   Specific Gravity, Urine >=1.030 1.005 - 1.030   Hgb urine dipstick LARGE (A) NEGATIVE   pH 5.5 5.0 - 8.0   Protein, ur 30 (A) NEGATIVE mg/dL   Urobilinogen, UA 0.2 0.0 - 1.0 mg/dL   Nitrite NEGATIVE NEGATIVE   Leukocytes, UA NEGATIVE NEGATIVE    Comment: Biochemical Testing Only. Please order routine urinalysis from main lab if confirmatory testing is needed.  I-STAT, chem 8     Status: Abnormal   Collection Time: 04/15/17  6:58 PM  Result Value Ref Range   Sodium 140 135 - 145 mmol/L   Potassium 4.1 3.5 - 5.1 mmol/L   Chloride 106 101 - 111 mmol/L   BUN 7 6 - 20 mg/dL   Creatinine, Ser 4.09  0.44 - 1.00 mg/dL   Glucose, Bld 93 65 - 99 mg/dL   Calcium, Ion 1.61 (L) 1.15 - 1.40 mmol/L   TCO2 23 22 - 32 mmol/L   Hemoglobin 11.2 (L) 12.0 - 15.0 g/dL   HCT 09.6 (L) 04.5 - 40.9 %    Dg Abd 2 Views  Result Date: 04/15/2017 CLINICAL DATA:  Left lower quadrant abdominal pain. EXAM: ABDOMEN - 2 VIEW COMPARISON:  02/11/2017 CT abdomen/pelvis. FINDINGS: No dilated small bowel loops or air-fluid levels. Moderate stool throughout the large bowel. No evidence of pneumatosis or pneumoperitoneum. No radiopaque nephrolithiasis. IMPRESSION:  Nonobstructive bowel gas pattern. Moderate colonic stool volume may indicate constipation. Electronically Signed   By: Delbert Phenix M.D.   On: 04/15/2017 18:59    ASSESSMENT AND PLAN:  Orders Placed This Encounter  Procedures  . DG Abd 2 Views    Standing Status:   Standing    Number of Occurrences:   1    Order Specific Question:   Reason for Exam (SYMPTOM  OR DIAGNOSIS REQUIRED)    Answer:   Abdominal pain, left lower quadrant.  No gaurding or rigidity.  Marland Kitchen POCT urinalysis dip (device)    Standing Status:   Standing    Number of Occurrences:   1  . I-STAT, chem 8    Standing Status:   Standing    Number of Occurrences:   1     Left lower quadrant pain patient with a large stool burden about the left colon.  I think this explains her left-sided abdominal pain despite her complaint of diarrhea.  With regard to the hematuria I tried to do a pelvic exam however she was too tender for this.  Of advised that if her pain is not getting better that she seek the care of a GYN or her PCP.  Apparently she has had Paps before however her exam was not this tender.  She may simply be guarding however I  feel that her level of tenderness with exam was well out of proportion to normal.    Benign essential microscopic hematuria  Constipation, unspecified constipation type      The patient is advised to call or return to clinic if she does not see an improvement in symptoms, or to seek the care of the closest emergency department if she worsens with the above plan.   Deliah Boston, MHS, PA-C 04/15/2017 7:39 PM    Ofilia Neas, PA-C 04/15/17 1953

## 2017-04-16 NOTE — Telephone Encounter (Addendum)
rtc to pt, script sent in jan #90 w/ 1 refill, pt did not answer, no vmail after 8 rings

## 2017-04-17 ENCOUNTER — Other Ambulatory Visit: Payer: Self-pay | Admitting: Internal Medicine

## 2017-04-18 ENCOUNTER — Ambulatory Visit: Payer: BLUE CROSS/BLUE SHIELD

## 2017-04-18 ENCOUNTER — Ambulatory Visit (INDEPENDENT_AMBULATORY_CARE_PROVIDER_SITE_OTHER): Payer: BLUE CROSS/BLUE SHIELD | Admitting: Internal Medicine

## 2017-04-18 ENCOUNTER — Encounter: Payer: Self-pay | Admitting: Internal Medicine

## 2017-04-18 VITALS — BP 155/92 | HR 76 | Temp 98.2°F | Wt 318.0 lb

## 2017-04-18 DIAGNOSIS — I10 Essential (primary) hypertension: Secondary | ICD-10-CM | POA: Diagnosis not present

## 2017-04-18 DIAGNOSIS — F339 Major depressive disorder, recurrent, unspecified: Secondary | ICD-10-CM | POA: Diagnosis not present

## 2017-04-18 DIAGNOSIS — F321 Major depressive disorder, single episode, moderate: Secondary | ICD-10-CM

## 2017-04-18 DIAGNOSIS — Z79899 Other long term (current) drug therapy: Secondary | ICD-10-CM | POA: Diagnosis not present

## 2017-04-18 MED ORDER — SERTRALINE HCL 50 MG PO TABS: 50.0000 mg | ORAL_TABLET | Freq: Every day | ORAL | 1 refills | Status: DC

## 2017-04-18 NOTE — Progress Notes (Signed)
   CC: Hypertension, Depression  HPI:  [name] is a 37 y.o. F with PMHx listed below presenting for Hypertension, Depression. Please see the A&P for the status of the patient's chronic medical problems.   Past Medical History:  Diagnosis Date  . Anemia   . Asthma   . Hypertension   . Obesity    Review of Systems: Performed and all others negative.  Physical Exam:  Vitals:   04/18/17 1117  BP: (!) 155/92  Pulse: 76  Temp: 98.2 F (36.8 C)  TempSrc: Oral  SpO2: 98%  Weight: (!) 318 lb (144.2 kg)   Physical Exam  Constitutional: She is oriented to person, place, and time. She appears well-developed and well-nourished.  Cardiovascular: Normal rate, regular rhythm, normal heart sounds and intact distal pulses.  Pulmonary/Chest: Effort normal and breath sounds normal. No respiratory distress.  Abdominal: Soft. Bowel sounds are normal. She exhibits no distension.  Obese abdomen  Musculoskeletal: She exhibits no edema or deformity.  Neurological: She is alert and oriented to person, place, and time.  Skin: Skin is warm and dry.     Assessment & Plan:   See Encounters Tab for problem based charting.  Patient discussed with Dr. Oswaldo Booth

## 2017-04-18 NOTE — Assessment & Plan Note (Addendum)
BP today 155/92, her BP at last visit was 155/89. She states that she has started taking her medications regularly; however, she did not take her medications this morning. 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 She believes her blood pressure has been in the 130s Systolic at these work checks. She states she can bring a log of her work reading to her next visit. She was reminded to take her medication prior to her visits so we can accurately evaluate her blood pressure. - Amlodipine 10mg  Daily - Carvedilol 6.25mg  Daily - Bring log of work measurements - Take medications prior to office visits

## 2017-04-18 NOTE — Assessment & Plan Note (Signed)
Patient presenting for follow up of depression. She was seen on 03/22/17 and reported symptoms of depression with PHQ-9 of 12 at that time. She was started on Sertraline and referred to Psychiatry at [facility].   Today she continues to score 12 on PHQ-9, but reports some improvement in her symptoms despite stopping the sertraline after about 2 weeks. She states she has improvement in her motivation (now taking antihypertensives), she has less anhedonia, and her appetite is somewhat improved. She continues to endorse trouble sleeping, but states this is chronic for her. She states that she tried taking the medication for 1 week ing the AM and felt too relaxed and somewhat unfocused and the tried taking it in the PM and had more trouble sleeping. Upon further questioning, she states that she really stopped taking her sertraline due to concern/anxiety about potential side effects when she read that it can cause suicidal ideation on the Internet. She was reassured that this side effect has been rarely noted in studies but is not expected; however, she should contact us if she does experience and suicidal ideation. She states that she would like to give Sertraline another try now that her concerns have been addressed. She also has an appointment scheduled with [facility] (psychiatry) later this month.  - Continue Sertraline 50mg  Daily - Follow up with [facility] - Call clinic with any further concerns (including about her medications) - Follow up in 4-6 weeks

## 2017-04-18 NOTE — Patient Instructions (Addendum)
Thank you for allowing [place] to care for you  For your high blood pressure: - Your blood pressure remains elevated today at 155/92 - Please brign your log from work measurement to your next visit  - Please take you medication before your follow up visit  For your depression: - Restart sertraline 50mg  Daily - Follow up with [facility] as scheduled - Please call the clinic if you have further concerns  Please follow up in 4-6 weeks to evaluate your progress and recheck blood pressure

## 2017-04-18 NOTE — Progress Notes (Signed)
Internal Medicine Clinic Attending  Case discussed with Dr. Melvin  at the time of the visit.  We reviewed the resident's history and exam and pertinent patient test results.  I agree with the assessment, diagnosis, and plan of care documented in the resident's note.  

## 2017-04-20 ENCOUNTER — Emergency Department (HOSPITAL_COMMUNITY): Payer: BLUE CROSS/BLUE SHIELD

## 2017-04-20 ENCOUNTER — Encounter (HOSPITAL_COMMUNITY): Payer: Self-pay

## 2017-04-20 ENCOUNTER — Emergency Department (HOSPITAL_COMMUNITY)
Admission: EM | Admit: 2017-04-20 | Discharge: 2017-04-20 | Disposition: A | Discharge status: Home / Self Care | Payer: BLUE CROSS/BLUE SHIELD | Attending: Emergency Medicine | Admitting: Emergency Medicine

## 2017-04-20 ENCOUNTER — Other Ambulatory Visit: Payer: Self-pay

## 2017-04-20 DIAGNOSIS — R079 Chest pain, unspecified: Secondary | ICD-10-CM | POA: Diagnosis not present

## 2017-04-20 DIAGNOSIS — R0789 Other chest pain: Secondary | ICD-10-CM | POA: Diagnosis not present

## 2017-04-20 DIAGNOSIS — R0602 Shortness of breath: Secondary | ICD-10-CM | POA: Diagnosis not present

## 2017-04-20 DIAGNOSIS — Z87891 Personal history of nicotine dependence: Secondary | ICD-10-CM | POA: Insufficient documentation

## 2017-04-20 DIAGNOSIS — Z79899 Other long term (current) drug therapy: Secondary | ICD-10-CM | POA: Insufficient documentation

## 2017-04-20 DIAGNOSIS — J45909 Unspecified asthma, uncomplicated: Secondary | ICD-10-CM | POA: Insufficient documentation

## 2017-04-20 DIAGNOSIS — R110 Nausea: Secondary | ICD-10-CM | POA: Insufficient documentation

## 2017-04-20 DIAGNOSIS — R682 Dry mouth, unspecified: Secondary | ICD-10-CM | POA: Diagnosis not present

## 2017-04-20 DIAGNOSIS — R002 Palpitations: Secondary | ICD-10-CM | POA: Diagnosis not present

## 2017-04-20 DIAGNOSIS — I10 Essential (primary) hypertension: Secondary | ICD-10-CM | POA: Diagnosis not present

## 2017-04-20 LAB — I-STAT BETA HCG BLOOD, ED (MC, WL, AP ONLY)

## 2017-04-20 LAB — BASIC METABOLIC PANEL
ANION GAP: 9 (ref 5–15)
BUN: 10 mg/dL (ref 6–20)
CALCIUM: 9.3 mg/dL (ref 8.9–10.3)
CHLORIDE: 107 mmol/L (ref 101–111)
CO2: 25 mmol/L (ref 22–32)
Creatinine, Ser: 0.63 mg/dL (ref 0.44–1.00)
GFR calc non Af Amer: >60 mL/min (ref >60)
Glucose, Bld: 98 mg/dL (ref 65–99)
Potassium: 4.2 mmol/L (ref 3.5–5.1)
SODIUM: 141 mmol/L (ref 135–145)

## 2017-04-20 LAB — CBC
HCT: 36.2 % (ref 36.0–46.0)
HEMOGLOBIN: 10.7 g/dL — AB (ref 12.0–15.0)
MCH: 19.3 pg — AB (ref 26.0–34.0)
MCHC: 29.6 g/dL — ABNORMAL LOW (ref 30.0–36.0)
MCV: 65.2 fL — ABNORMAL LOW (ref 78.0–100.0)
Platelets: 329 10*3/uL (ref 150–400)
RBC: 5.55 MIL/uL — AB (ref 3.87–5.11)
RDW: 19.6 % — ABNORMAL HIGH (ref 11.5–15.5)
WBC: 9.2 10*3/uL (ref 4.0–10.5)

## 2017-04-20 LAB — I-STAT TROPONIN, ED: Troponin i, poc: 0 ng/mL (ref 0.00–0.08)

## 2017-04-20 MED ORDER — PROMETHAZINE HCL 25 MG PO TABS: 25.0000 mg | ORAL_TABLET | Freq: Four times a day (QID) | ORAL | 0 refills | Status: DC | PRN

## 2017-04-20 MED ORDER — ONDANSETRON 4 MG PO TBDP
4.0000 mg | ORAL_TABLET | Freq: Once | ORAL | Status: AC
Start: 2017-04-20 — End: 2017-04-20
  Administered 2017-04-20: 4 mg via ORAL
  Filled 2017-04-20: qty 1

## 2017-04-20 MED ORDER — GI COCKTAIL ~~LOC~~
30.0000 mL | Freq: Once | ORAL | Status: AC
  Administered 2017-04-20: 30 mL via ORAL
  Filled 2017-04-20: qty 30

## 2017-04-20 NOTE — ED Triage Notes (Signed)
She c/o central chest "pressure" with shortness of breath since late last night. She was awakened abruptly from sleep at ~0530 this morning with palpitations and chest pressure and shortness of breath. At this time she is in no distress and is breathing normally. EKG/labs performed at triage.

## 2017-04-20 NOTE — Discharge Instructions (Signed)
Please call and schedule follow-up appointment with your primary care provider in the next 2-3 days for a recheck.   Take 1 tablet of Phenergan every 6 hours as needed for nausea.  Make sure to continue your home blood pressure medications.  If you develop any new or worsening symptoms including chest pain with activity, shortness of breath, jaw or left arm pain, lightheadedness with your pain, or other new concerning symptoms, please return to the emergency department for re-evaluation.

## 2017-04-20 NOTE — ED Notes (Signed)
Bed: WA04 Expected date:  Expected time:  Means of arrival:  Comments: 

## 2017-04-20 NOTE — ED Notes (Signed)
Pt stated she was a hard stick, unable to get lab draw

## 2017-04-20 NOTE — ED Provider Notes (Signed)
[facility]-EMERGENCY DEPT Provider Note   CSN: [id number] Arrival date & time: 04/20/17  1214     History   Chief Complaint Chief Complaint  Patient presents with  . Chest Pain    HPI Victoria Booth is a 37 y.o. female with a history of PCOS, HTN prediabetes, morbid obesity, and asthma who presents to the emergency department with a chief complaint of chest pain, palpitations, nausea, xerostomia, and polydipsia that began last night.   She reports sudden onset constant, central, non-radiating CP, characterized as "heaviness" that began last night with associated intermittent palpitations that she describes as "it feels like my heart is skipping a beat" and constant nausea. Symptoms began while she was sitting at a baby shower.  No aggravating or alleviating factors.   She also reports that she has been extremely thirsty and had one episode of diarrhea this AM. She states "I feel off, like something isn't right, and I feel like I'm moving slow."   She reports that the CP and palpitations awoke her from sleep at 5:30 AM, and she felt like she had to catch her breath, which has since resolved. She states "I have asthma and know what that feels like, but this isn't my asthma."    She treated her symptoms with one dose of Nexium, tums, and alkaseltzer with minimal improvement.   She denies fever, chills, back pain, dyspnea, headache, leg swelling, dizziness, lightheadedness, visual changes, abdominal pain, or URI sx.   She was seen by her PCP 2 days ago and was started on Sertraline. She has taken only one dose, which was two days ago. She reports that she was restarted on spironolactone for her hypertension at the same visit.   The history is provided by the patient. No language interpreter was used.  Chest Pain   This is a new problem. The current episode started yesterday. The problem occurs constantly. The problem has not changed since onset.Associated symptoms  include nausea and palpitations. Pertinent negatives include no abdominal pain, no back pain, no cough, no dizziness, no fever, no headaches, no numbness, no shortness of breath, no vomiting and no weakness.    Past Medical History:  Diagnosis Date  . Anemia   . Asthma   . Hypertension   . Obesity     Patient Active Problem List   Diagnosis Date Noted  . Major depressive disorder 03/21/2017  . Migraine 10/24/2016  . Hematuria 09/24/2016  . Prediabetes 08/21/2016  . Lumbar back pain 08/21/2016  . Allergic rhinitis 06/18/2016  . Left flank pain 06/05/2016  . Obesity, morbid, BMI 50 or higher (HCC) 06/05/2016  . PCOS (polycystic ovarian syndrome) 04/05/2016  . Essential hypertension, benign 12/01/2014  . Asthma, chronic 12/01/2014  . Microcytic anemia 12/01/2014  . Preventative health care 12/01/2014    Past Surgical History:  Procedure Laterality Date  . TOENAIL EXCISION      OB History    No data available       Home Medications    Prior to Admission medications   Medication Sig Start Date End Date Taking? Authorizing Provider  albuterol (PROVENTIL HFA;VENTOLIN HFA) 108 (90 Base) MCG/ACT inhaler Inhale 1-2 puffs into the lungs every 6 (six) hours as needed for wheezing or shortness of breath. 05/01/16  Yes Domenick Gong, MD  carvedilol (COREG) 6.25 MG tablet Take 1 tablet (6.25 mg total) by mouth 2 (two) times daily. Patient taking differently: Take 6.25 mg by mouth daily.  11/21/16 04/20/17 Yes  Lorenso Courier, MD  metoprolol succinate (TOPROL-XL) 25 MG 24 hr tablet Take 25 mg by mouth daily.   Yes [provider]  sertraline (ZOLOFT) 50 MG tablet Take 1 tablet (50 mg total) by mouth daily. 04/18/17 05/18/17 Yes Beola Cord, MD  spironolactone (ALDACTONE) 50 MG tablet Take 50 mg by mouth daily.   Yes [provider]  amLODipine (NORVASC) 10 MG tablet Take 1 tablet (10 mg total) by mouth daily. Patient not taking: Reported on 04/20/2017 03/06/17  09/02/17  Scherrie Gerlach, MD  ciprofloxacin (CIPRO) 500 MG tablet Take 1 tablet (500 mg total) by mouth 2 (two) times daily for 5 days. Patient not taking: Reported on 04/20/2017 04/15/17 04/20/17  Ofilia Neas, PA-C  docusate sodium (COLACE) 250 MG capsule Take 1 capsule (250 mg total) by mouth daily. Patient not taking: Reported on 04/20/2017 04/15/17   Ofilia Neas, PA-C  ondansetron (ZOFRAN ODT) 4 MG disintegrating tablet Take 1 tablet (4 mg total) by mouth every 8 (eight) hours as needed for nausea or vomiting. Patient not taking: Reported on 04/20/2017 02/12/17   Scherrie Gerlach, MD  promethazine (PHENERGAN) 25 MG tablet Take 1 tablet (25 mg total) by mouth every 6 (six) hours as needed for nausea or vomiting. 04/20/17   Everlina Gotts A, PA-C    Family History Family History  Problem Relation Age of Onset  . Diabetes Mother   . Hypertension Mother     Social History Social History   Tobacco Use  . Smoking status: Former Smoker    Packs/day: 0.10    Types: Cigarettes    Last attempt to quit: 04/06/2015    Years since quitting: 2.0  . Smokeless tobacco: Never Used  . Tobacco comment: 1 cig / month.  Substance Use Topics  . Alcohol use: No    Alcohol/week: 0.0 oz  . Drug use: No     Allergies   Penicillins; Shellfish allergy; Sulfa antibiotics; and Iodine   Review of Systems Review of Systems  Constitutional: Negative for activity change, chills and fever.  HENT: Negative for congestion and sore throat.   Respiratory: Negative for cough and shortness of breath.   Cardiovascular: Positive for chest pain and palpitations. Negative for leg swelling.  Gastrointestinal: Positive for nausea. Negative for abdominal pain, diarrhea and vomiting.  Endocrine: Positive for polydipsia and polyuria.  Genitourinary: Negative for dysuria.  Musculoskeletal: Negative for back pain, myalgias and neck pain.  Skin: Negative for rash.  Allergic/Immunologic: Negative for immunocompromised  state.  Neurological: Negative for dizziness, weakness, numbness and headaches.  Psychiatric/Behavioral: Negative for confusion.     Physical Exam Updated Vital Signs BP (!) 143/97 (BP Location: Left Wrist)   Pulse 65   Temp 97.8 F (36.6 C) (Oral)   Resp 19   Ht 5\' 7"  (1.702 m)   Wt (!) 144.2 kg (318 lb)   LMP 03/26/2017 (Approximate)   SpO2 98%   BMI 49.81 kg/m   Physical Exam  Constitutional: She is oriented to person, place, and time. She does not appear ill. No distress.  Morbidly obese  HENT:  Head: Normocephalic.  Eyes: Conjunctivae are normal.  Neck: Normal range of motion. Neck supple. No JVD present.  Cardiovascular: Normal rate, regular rhythm, normal heart sounds and intact distal pulses. Exam reveals no gallop and no friction rub.  No murmur heard. Pulses:      Radial pulses are 2+ on the right side, and 2+ on the left side.  Dorsalis pedis pulses are 2+ on the right side, and 2+ on the left side.       Posterior tibial pulses are 2+ on the right side, and 2+ on the left side.  Pulmonary/Chest: Effort normal and breath sounds normal. No stridor. No respiratory distress. She has no wheezes. She has no rales. She exhibits no tenderness.  Abdominal: Soft. Bowel sounds are normal. She exhibits no distension and no mass. There is no tenderness. There is no rebound and no guarding. No hernia.  Musculoskeletal:       Right lower leg: She exhibits no tenderness and no edema.       Left lower leg: She exhibits no tenderness and no edema.  Lymphadenopathy:    She has no cervical adenopathy.  Neurological: She is alert and oriented to person, place, and time.  Skin: Skin is warm. Capillary refill takes less than 2 seconds. No rash noted. She is not diaphoretic.  Psychiatric: Her behavior is normal. Her mood appears not anxious.  Nursing note and vitals reviewed.    ED Treatments / Results  Labs (all labs ordered are listed, but only abnormal results are  displayed) Labs Reviewed  CBC - Abnormal; Notable for the following components:      Result Value   RBC 5.55 (*)    Hemoglobin 10.7 (*)    MCV 65.2 (*)    MCH 19.3 (*)    MCHC 29.6 (*)    RDW 19.6 (*)    All other components within normal limits  BASIC METABOLIC PANEL  TSH  I-STAT TROPONIN, ED  I-STAT BETA HCG BLOOD, ED ([facility], [facility], [facility] ONLY)    EKG  EKG Interpretation  Date/Time:  Sunday April 20 2017 12:22:27 EDT Ventricular Rate:  71 PR Interval:    QRS Duration: 78 QT Interval:  380 QTC Calculation: 413 R Axis:   -3 Text Interpretation:  Sinus rhythm Low voltage, precordial leads No significant change since last tracing Confirmed by Haviland, Julie ([id number]) on 04/20/2017 2:20:14 PM       Radiology Dg Chest 2 View  Result Date: 04/20/2017 CLINICAL DATA:  Heart palpitations, shortness of breath and left-sided chest pain onset yesterday. EXAM: CHEST - 2 VIEW COMPARISON:  Chest x-ray dated 04/19/2015 FINDINGS: The heart size and mediastinal contours are within normal limits. Both lungs are clear. The visualized skeletal structures are unremarkable. IMPRESSION: No active cardiopulmonary disease. Electronically Signed   By: Stan  Maynard M.D.   On: 04/20/2017 13:07    Procedures Procedures (including critical care time)  Medications Ordered in ED Medications  ondansetron (ZOFRAN-ODT) disintegrating tablet 4 mg (4 mg Oral Given 04/20/17 1443)  gi cocktail (Maalox,Lidocaine,Donnatal) (30 mLs Oral Given 04/20/17 1638)     Initial Impression / Assessment and Plan / ED Course  I have reviewed the triage vital signs and the nursing notes.  Pertinent labs & imaging results that were available during my care of the patient were reviewed by me and considered in my medical decision making (see chart for details).      36  year old female with a history of PCOS, HTN prediabetes, morbid obesity, and asthma who presents to the emergency department with chest pain, palpitations, nausea,  xerostomia, and polydipsia that began last night.   Patient is to be discharged with recommendation to follow up with PCP in regards to today's hospital visit. Chest pain is not likely of cardiac or pulmonary etiology d/t presentation, PERC negative, VSS, no tracheal deviation, no JVD or new murmur,  RRR, breath sounds equal bilaterally, EKG without acute abnormalities and unchanged from previous, negative troponin, and negative CXR.  Second troponin not indicated at this time given length of symptoms.  Glucose is 98. TSH is pending and patient can follow up on this with her PCP.  Nausea improved with Zofran.  Patient is for GI cocktail she is concerned for GERD, but after starting to drink the cocktail she stopped drinking it because she did not like the numbing sensation from the lidocaine.  She states that she was restarted on spironolactone by her PCP 2 days ago, but after reviewing the note it appears that she  supposed to be taking amlodipine and carvedilol.  She also started taking the sertraline again 2 days ago, but has not taking any additional doses of medication.  Pt has been advised to return to the ED if CP becomes exertional, associated with diaphoresis or nausea, radiates to left jaw/arm, worsens or becomes concerning in any way. Pt appears reliable for follow up and is agreeable to discharge.    Final Clinical Impressions(s) / ED Diagnoses   Final diagnoses:  Atypical chest pain    ED Discharge Orders        Ordered    promethazine (PHENERGAN) 25 MG tablet  Every 6 hours PRN     04/20/17 1646       Augustine Brannick A, PA-C 04/20/17 1702    Jacalyn Lefevre, MD 04/22/17 [phone]

## 2017-04-24 ENCOUNTER — Ambulatory Visit (INDEPENDENT_AMBULATORY_CARE_PROVIDER_SITE_OTHER): Payer: BLUE CROSS/BLUE SHIELD | Admitting: Internal Medicine

## 2017-04-24 ENCOUNTER — Telehealth: Payer: Self-pay | Admitting: *Deleted

## 2017-04-24 VITALS — BP 144/81 | HR 81 | Temp 98.0°F | Wt 313.8 lb

## 2017-04-24 DIAGNOSIS — R002 Palpitations: Secondary | ICD-10-CM

## 2017-04-24 DIAGNOSIS — R319 Hematuria, unspecified: Secondary | ICD-10-CM

## 2017-04-24 DIAGNOSIS — D509 Iron deficiency anemia, unspecified: Secondary | ICD-10-CM | POA: Diagnosis not present

## 2017-04-24 DIAGNOSIS — Z79899 Other long term (current) drug therapy: Secondary | ICD-10-CM | POA: Diagnosis not present

## 2017-04-24 DIAGNOSIS — F339 Major depressive disorder, recurrent, unspecified: Secondary | ICD-10-CM

## 2017-04-24 DIAGNOSIS — F5089 Other specified eating disorder: Secondary | ICD-10-CM | POA: Diagnosis not present

## 2017-04-24 DIAGNOSIS — F419 Anxiety disorder, unspecified: Secondary | ICD-10-CM

## 2017-04-24 DIAGNOSIS — R5383 Other fatigue: Secondary | ICD-10-CM | POA: Diagnosis not present

## 2017-04-24 DIAGNOSIS — Z6841 Body Mass Index (BMI) 40.0 and over, adult: Secondary | ICD-10-CM | POA: Diagnosis not present

## 2017-04-24 DIAGNOSIS — E282 Polycystic ovarian syndrome: Secondary | ICD-10-CM | POA: Diagnosis not present

## 2017-04-24 DIAGNOSIS — I10 Essential (primary) hypertension: Secondary | ICD-10-CM

## 2017-04-24 DIAGNOSIS — Z87442 Personal history of urinary calculi: Secondary | ICD-10-CM | POA: Diagnosis not present

## 2017-04-24 MED ORDER — FERROUS SULFATE 325 (65 FE) MG PO TABS: 325.0000 mg | ORAL_TABLET | Freq: Every day | ORAL | 3 refills | Status: DC

## 2017-04-24 MED ORDER — HYDROCHLOROTHIAZIDE 12.5 MG PO CAPS: 12.5000 mg | ORAL_CAPSULE | Freq: Every day | ORAL | 2 refills | Status: DC

## 2017-04-24 MED ORDER — LISINOPRIL 10 MG PO TABS: 10.0000 mg | ORAL_TABLET | Freq: Every day | ORAL | 11 refills | Status: DC

## 2017-04-24 NOTE — Patient Instructions (Addendum)
Thank You for visiting clinic today. As we discussed I am checking your iron levels, I am also giving you a prescription for iron supplement as your symptoms are most likely due to your anemia. I am also adding one medicine called lisinopril for your blood pressure, the most common side effect is cough and a rare but dangerous side effect is the swelling of your lips and face.  If you experience any 1 of those please stop taking that medicine and seek medical attention. I am also giving you a referral for sleep study to see if you are having any sleep apnea. Please follow-up in 1 month.

## 2017-04-24 NOTE — Progress Notes (Signed)
   CC: Emergency room follow-up-complaining of generalized fatigue and intermittent palpitations.  HPI:  [name] is a 37 y.o. with past medical history as listed below came to the clinic for her emergency room visit follow-up.  She was seen in emergency room on April 20, 2017 due to atypical chest pain and palpitations, ACS was rule out with no change in EKG and negative troponin.  Patient was complaining of generalized fatigue, easily get tired, exertional dyspnea, cannot go full flight of stairs up without taking a break, on flat surface can hardly walk half a block.  She denies any more chest pain, intermittently feels some palpitations, denies any presyncope or syncope.  She denies any orthopnea or PND.  She denies any headache or dizziness. Patient does snore a lot, not aware of any sleep apnea, no morning headaches but she does not feel refreshed during the day.  She is having pica with ice craving.  She has an history of PCO S and her last menstrual cycle was in May 2018. She had one episode of gross hematuria on April 15, 2017, which she was seen at an urgent care and was given a course of Cipro due to possible UTI.  She had a renal stone study in January 2019 which was positive for small nonobstructive renal calculi.  Patient has an history of depression and recently started on sertraline by her PCP, denies any worsening of her symptoms or suicidal ideations. She denies any excessive cold intolerance, she does not feel hot most of the time, no complaint of any excessive dry skin. She denies any urinary symptoms. She denies any recent change in her bowel habits or appetite.  Please see assessment and plan for her chronic conditions.  Past Medical History:  Diagnosis Date  . Anemia   . Asthma   . Hypertension   . Obesity    Review of Systems: Negative except mentioned in HPI.  Physical Exam:  Vitals:   04/24/17 1456  BP: (!) 144/81  Pulse: 81  Temp: 98 F (36.7 C)    TempSrc: Oral  Weight: (!) 313 lb 12.8 oz (142.3 kg)    General: Vital signs reviewed.  Patient is well-developed and morbidly obese, in no acute distress and cooperative with exam.  Head: Normocephalic and atraumatic. Eyes: EOMI, conjunctivae normal, no scleral icterus.  Cardiovascular: RRR, S1 normal, S2 normal, no murmurs, gallops, or rubs. Pulmonary/Chest: Clear to auscultation bilaterally, no wheezes, rales, or rhonchi. Abdominal: Soft, non-tender, non-distended, BS +, no masses, organomegaly, or guarding present.  Extremities: No lower extremity edema bilaterally,  pulses symmetric and intact bilaterally. No cyanosis or clubbing. Skin: Warm, dry and intact. No rashes or erythema. Psychiatric: Normal mood and affect. speech and behavior is normal. Cognition and memory are normal.  Assessment & Plan:   See Encounters Tab for problem based charting.  Patient discussed with Dr. Josem Kaufmann.

## 2017-04-24 NOTE — Telephone Encounter (Signed)
Call from Debra,RN at [facility] (at pt's job) about pt's abd pain and abnl urine. I asked to speak to pt - stated she went to UC last week - urine done for UTI and abd x-ray for c/o abd pain. Nurse had re-check urine which still showed high WBC's and pt is on last day of abx. I asked if she need to be seen today, stated yes - appt scheduled in [facility] @ 1415 PM.

## 2017-04-25 DIAGNOSIS — R5383 Other fatigue: Secondary | ICD-10-CM | POA: Insufficient documentation

## 2017-04-25 LAB — IRON AND TIBC
IRON SATURATION: 6 % — AB (ref 15–55)
Iron: 18 ug/dL — ABNORMAL LOW (ref 27–159)
Total Iron Binding Capacity: 292 ug/dL (ref 250–450)
UIBC: 274 ug/dL (ref 131–425)

## 2017-04-25 LAB — FERRITIN: Ferritin: 79 ng/mL (ref 15–150)

## 2017-04-25 LAB — TSH: TSH: 1.25 u[IU]/mL (ref 0.450–4.500)

## 2017-04-25 NOTE — Assessment & Plan Note (Signed)
BP Readings from Last 3 Encounters:  04/24/17 (!) 144/81  04/20/17 (!) 143/97  04/18/17 (!) 155/92   Her blood pressure was elevated today.  According to patient whenever she checked her blood pressure at work normally stays in the 150s. Patient currently on amlodipine 10 mg daily. We will add lisinopril 10 mg daily-we will titrate the dose if needed. Thiazide diuretics should be avoided because of her history of renal calculi.

## 2017-04-25 NOTE — Assessment & Plan Note (Signed)
Urine dipstick done in clinic was negative for any blood. She does not have any urinary symptoms.

## 2017-04-25 NOTE — Assessment & Plan Note (Addendum)
Her last hemoglobin checked on April 20, 2017 was 10.7, she does has some symptoms of iron deficiency as craving for ice. Her anemia with hemoglobin of 10.7 might be responsible for her exertional dyspnea and palpitations but less likely.  Iron levels and ferritin was checked which shows low iron stores with iron saturation of 6.  Ferritin was within normal limit. She was given a prescription of iron supplement. If needed she can be given a dose of Feraheme. We will reevaluate during next follow-up visit.  Addendum.  According to recommendations she will need a colonoscopy instead of stool cards.  Called the patient and she agreed to proceed with colonoscopy.  A GI consult referral was placed.

## 2017-04-25 NOTE — Assessment & Plan Note (Signed)
Most likely contributory to her exertional dyspnea. According to patient she is trying to lose some weight and she has to show some motivation to become a candidate for bariatric surgery.  We had a long discussion regarding low carbohydrate diet with portion control and exercise regularly in order to lose some weight that will help with her current symptoms. She might be having obstructive sleep apnea because of her body habitus-which can contribute to her fatigue.

## 2017-04-25 NOTE — Assessment & Plan Note (Signed)
Her symptoms of generalized nonspecific fatigue and intermittent palpitations can be multifactorial.  She has mild iron deficiency anemia, she is morbidly obese and might be having obstructive sleep apnea.  Anxiety and depression might be playing a role.  TSH was checked which was within normal limit. Split-night sleep study was ordered. She was given stool card for FOBT-as she is having iron deficiency without any heavy menstrual cycle her last menstrual cycle was in May 2018.  Pregnancy test done on April 20, 2017 was negative.

## 2017-04-28 ENCOUNTER — Other Ambulatory Visit: Payer: Self-pay

## 2017-04-28 ENCOUNTER — Telehealth: Payer: Self-pay | Admitting: Internal Medicine

## 2017-04-28 NOTE — Progress Notes (Signed)
Patient ID: Victoria Booth, female   DOB: June 08, 1980, 36 y.o.   MRN: [id number]  Upon further review of the iron profile, I am not convinced this is consistent with iron deficiency anemia as the TIBC is not even near high normal and the ferritin is well within the normal range.  She also has a previous history of a low iron saturation.  We will hold off on the colonoscopy, stool cards, and iron supplementation and review the iron profile, its history, and the ferritin with Dr. Cyndie Chime before deciding upon the need for further evaluation.  We will provide an update once we have this review.

## 2017-04-28 NOTE — Telephone Encounter (Signed)
Rec'd a call from [place] @ the [facility].  In order for thi order to be processed and approved you must call [employer] and do a Peer-to-peer review with their Dr's to get this approved to be scheduled.  The number to call is [phone] for this peer to peer and must be completed with 24 hours to be approved.  Once you receive an Authorization number please let me know so that it can be called in to the sleep [facility] for scheduling.

## 2017-04-28 NOTE — Progress Notes (Signed)
Patient ID: Victoria Booth, female   DOB: 1980/05/05, 37 y.o.   MRN: [id number]  We discussed the iron profile and ferritin with Dr. Cyndie Chime.  He believes with the iron sat being so low, this could very well be an iron deficiency anemia.  His recommendation was to empirically treat with oral iron therapy.  We will therefore do so and observe her response via the iron profile.  Her iron profile is essentially unchanged over 2 1/2 years and therefore, given this and her young age, my suspicion for a GI malignancy is low, although the GI tract could be the source of her presumed iron deficiency.  We will discuss this with the patient and recommend a GI evaluation for her presumed iron deficiency to include endoscopy (upper and lower).  I do not believe stool cards have any role in the assessment of this patient given their lack of sensitivity and specificity.  Technically, she is not anemic and has a persistent microcytosis, thus there may be a component of an undiagnosed alpha thalassemia as well.  Dr. Nelson Chimes will document the patient's thoughts once she has spoken with her.

## 2017-04-28 NOTE — Progress Notes (Signed)
Case discussed with Dr. Nelson Chimes at the time of the visit.  We reviewed the resident's history and exam and pertinent patient test results.  I agree with the assessment, diagnosis and plan of care documented in the resident's note.  As the patient has not menstruated in 10 months and has iron deficiency anemia, which is otherwise not explained, she will require a colonoscopy, as stool cards are not specific enough to rule out serious GI pathology as a possible cause.  Will ask Dr. Nelson Chimes to inform the patient and place a referral for a colonoscopy to further work-up this iron deficiency in a woman who has not had a menstrual cycle in 10 months.

## 2017-04-28 NOTE — Addendum Note (Signed)
Addended by: Arnetha Courser on: 04/28/2017 12:53 PM   Modules accepted: Orders

## 2017-05-06 DIAGNOSIS — H6691 Otitis media, unspecified, right ear: Secondary | ICD-10-CM | POA: Diagnosis not present

## 2017-05-06 DIAGNOSIS — J00 Acute nasopharyngitis [common cold]: Secondary | ICD-10-CM | POA: Diagnosis not present

## 2017-05-09 ENCOUNTER — Telehealth: Payer: Self-pay | Admitting: Internal Medicine

## 2017-05-09 NOTE — Telephone Encounter (Signed)
Pt would like a call back about her BP medications.  PT states since she started her lisinopril medication she is keeping a consistent dry cough that is very aggravating.

## 2017-05-09 NOTE — Telephone Encounter (Signed)
Call the patient and advise her to stop taking lisinopril. She should be seen in [facility] early next week, patient will call Monday morning to make an appointment.

## 2017-05-10 NOTE — Telephone Encounter (Signed)
Thank you for following up with this Dr. Nelson Chimes. Agree with plan.  Aymee Fomby

## 2017-05-27 ENCOUNTER — Ambulatory Visit: Payer: BLUE CROSS/BLUE SHIELD

## 2017-06-02 DIAGNOSIS — B349 Viral infection, unspecified: Secondary | ICD-10-CM | POA: Diagnosis not present

## 2017-06-02 DIAGNOSIS — R5383 Other fatigue: Secondary | ICD-10-CM | POA: Diagnosis not present

## 2017-06-02 DIAGNOSIS — R112 Nausea with vomiting, unspecified: Secondary | ICD-10-CM | POA: Diagnosis not present

## 2017-06-05 ENCOUNTER — Other Ambulatory Visit: Payer: Self-pay

## 2017-06-05 ENCOUNTER — Encounter: Payer: Self-pay | Admitting: Internal Medicine

## 2017-06-05 ENCOUNTER — Ambulatory Visit (INDEPENDENT_AMBULATORY_CARE_PROVIDER_SITE_OTHER): Payer: BLUE CROSS/BLUE SHIELD | Admitting: Internal Medicine

## 2017-06-05 VITALS — BP 153/98 | HR 81 | Temp 98.0°F | Ht 67.0 in | Wt 313.5 lb

## 2017-06-05 DIAGNOSIS — D509 Iron deficiency anemia, unspecified: Secondary | ICD-10-CM | POA: Diagnosis not present

## 2017-06-05 DIAGNOSIS — E669 Obesity, unspecified: Secondary | ICD-10-CM | POA: Diagnosis not present

## 2017-06-05 DIAGNOSIS — G4733 Obstructive sleep apnea (adult) (pediatric): Secondary | ICD-10-CM

## 2017-06-05 DIAGNOSIS — I10 Essential (primary) hypertension: Secondary | ICD-10-CM

## 2017-06-05 DIAGNOSIS — R351 Nocturia: Secondary | ICD-10-CM | POA: Diagnosis not present

## 2017-06-05 DIAGNOSIS — K5900 Constipation, unspecified: Secondary | ICD-10-CM | POA: Diagnosis not present

## 2017-06-05 DIAGNOSIS — Z6841 Body Mass Index (BMI) 40.0 and over, adult: Secondary | ICD-10-CM

## 2017-06-05 DIAGNOSIS — E282 Polycystic ovarian syndrome: Secondary | ICD-10-CM | POA: Diagnosis not present

## 2017-06-05 DIAGNOSIS — Z79899 Other long term (current) drug therapy: Secondary | ICD-10-CM | POA: Diagnosis not present

## 2017-06-05 MED ORDER — SPIRONOLACTONE 25 MG PO TABS: 25.0000 mg | ORAL_TABLET | Freq: Every day | ORAL | 1 refills | Status: DC

## 2017-06-05 NOTE — Assessment & Plan Note (Signed)
Patient was started on iron supplementation 1 month ago for possible iron deficiency anemia.  On review of labs, she has chronic microcytic anemia most recently with hemoglobin 10.7 with MCV 65.  She does not menstruate secondary to her PCOS.  Recent iron studies are not consistent with iron deficiency anemia.  Although iron is low, ferritin is normal at 79.  She is complaining of constipation with iron pills that is not relieved with MiraLAX.  We will discontinue iron supplements and monitor off treatment for now.  Plan to repeat CBC in 3 months.  Given the degree of microcytosis, it is possible that she has thalassemia trait. If she has persistent microcytic anemia on recheck, consider work-up with hemoglobin electrophoresis. -- Stop iron -- Repeat CBC in 3 months  -- If persistent microcytic anemia, consider work up for thalassemia trait

## 2017-06-05 NOTE — Assessment & Plan Note (Addendum)
Patient is here for blood pressure follow-up.  Lisinopril was discontinued due to side effect of cough.  She is currently uncontrolled on amlodipine 10 mg daily.  She reports previously being prescribed spironolactone for both hypertension and PCOS with good blood pressure control.  It was discontinued because she was noncompliant.  She is interested in restarting this medication today. --Continue amlodipine 10 mg daily --Start spironolactone 25 mg daily --Follow-up 1 month for blood pressure recheck and BMP, will likely need up titration of spironolactone (previously on 100 mg daily)

## 2017-06-05 NOTE — Assessment & Plan Note (Signed)
Patient reports frequent nighttime awakenings, unrefreshing sleep, and nocturia.  She has been told by multiple family members that she snores and her sister has witnessed her experience apneic episodes.  She is obese with uncontrolled hypertension.  Today we discussed possible diagnosis of OSA and treatment with CPAP.  She is agreeable to sleep study. --Referral to sleep medicine

## 2017-06-05 NOTE — Progress Notes (Signed)
   CC: Blood pressure follow-up  HPI:  [name] is a 37 y.o. female with past medical history outlined below here for blood pressure follow-up. For the details of today's visit, please refer to the assessment and plan.  Past Medical History:  Diagnosis Date  . Anemia   . Asthma   . Hypertension   . Obesity     Review of Systems  Respiratory: Negative for shortness of breath.   Cardiovascular: Negative for chest pain.  Gastrointestinal: Positive for constipation.    Physical Exam:  Vitals:   06/05/17 1609  BP: (!) 153/98  Pulse: 81  Temp: 98 F (36.7 C)  TempSrc: Oral  SpO2: 99%  Weight: (!) 313 lb 8 oz (142.2 kg)  Height: 5\' 7"  (1.702 m)    Constitutional: Obese, NAD, appears comfortable Cardiovascular: RRR, no murmurs, rubs, or gallops.  Pulmonary/Chest: CTAB, no wheezes, rales, or rhonchi.  Extremities: Warm and well perfused. No edema.  Psychiatric: Normal mood and affect  Assessment & Plan:   See Encounters Tab for problem based charting.  Patient discussed with Dr. Oswaldo Done

## 2017-06-05 NOTE — Patient Instructions (Signed)
FOLLOW-UP INSTRUCTIONS When: 1 month For: BP follow up What to bring: Medication  Ms. Ogier,  It was a pleasure to meet you. I have started you back on spironolactone. Please take 25 mg daily. Continue to take amlodipine as previously prescribed. Follow up with [place] again in 1 month for blood pressure recheck and lab work.   You may stop taking your iron pills. I am not convinced you have iron deficiency. We will monitor you for now and repeat labs in 3 months.   If you have any questions or concerns, call our clinic at [phone] or after hours call [phone] and ask for the internal medicine resident on call. Thank you!  - Dr. Antony Contras

## 2017-06-06 NOTE — Progress Notes (Signed)
Internal Medicine Clinic Attending  Case discussed with Dr. Guilloud at the time of the visit.  We reviewed the resident's history and exam and pertinent patient test results.  I agree with the assessment, diagnosis, and plan of care documented in the resident's note.  

## 2017-06-19 ENCOUNTER — Telehealth: Payer: Self-pay | Admitting: Internal Medicine

## 2017-07-04 ENCOUNTER — Telehealth: Payer: Self-pay

## 2017-07-04 NOTE — Telephone Encounter (Signed)
What does the patient want naproxen for? Where is the pain?

## 2017-07-04 NOTE — Telephone Encounter (Signed)
Reviewed med history, looks like last prescribed in 2018 by dr rivet

## 2017-07-04 NOTE — Telephone Encounter (Signed)
Requesting naproxen to be filled. Please call pt back.

## 2017-07-07 DIAGNOSIS — F411 Generalized anxiety disorder: Secondary | ICD-10-CM | POA: Diagnosis not present

## 2017-07-09 ENCOUNTER — Telehealth: Payer: Self-pay | Admitting: Internal Medicine

## 2017-07-09 NOTE — Telephone Encounter (Signed)
Thank you :)

## 2017-07-09 NOTE — Telephone Encounter (Signed)
rtc to pt, lm for rtc 

## 2017-07-09 NOTE — Telephone Encounter (Signed)
Pt rtn called and stated she no longer needs a refill for the naproxen medication she is feeling better.. Please disregard previous message.

## 2017-07-10 NOTE — Progress Notes (Signed)
CC: Blood pressure follow up  HPI:  [name] is a 37 y.o. female with essential hypertension, OSA, PCOS, Chronic asthma, and allergic rhinitis who presents for hypertension follow up. Please see problem based charting for evaluation, assessment, and plan.  Past Medical History:  Diagnosis Date  . Anemia   . Asthma   . Hypertension   . Obesity    Review of Systems:   Denies headache, sob, or chest pain Has nausea, vomiting, abdominal pain, and diarrhea  Physical Exam:  Vitals:   07/11/17 1509  BP: (!) 153/79  Pulse: 93  Temp: 98.2 F (36.8 C)  TempSrc: Oral  SpO2: 99%  Weight: (!) 309 lb 9.6 oz (140.4 kg)   Physical Exam  Constitutional: She appears well-developed and well-nourished. No distress.  HENT:  Head: Normocephalic and atraumatic.  Eyes: Conjunctivae are normal.  Cardiovascular: Normal rate, regular rhythm and normal heart sounds.  Respiratory: Effort normal and breath sounds normal. No respiratory distress. She has no wheezes.  GI: Soft. Bowel sounds are normal. She exhibits no distension. There is tenderness (left abdomen ).  Musculoskeletal: She exhibits no edema.  Neurological: She is alert.  Skin: She is not diaphoretic.  Psychiatric: She has a normal mood and affect. Her behavior is normal. Judgment and thought content normal.     Assessment & Plan:   See Encounters Tab for problem based charting.  Essential Hypertension The patient presented with a blood pressure of 153/79. The patient takes amlodipine 10mg  qd and spironolactone 25mg  qd. She states that she has been taking all of her medication and not missed any doses.   Assessment The patient continues to be hypertensive despite being on two anti-hypertensives. Will continue spironolactone due to the patient's PCOS. Will start the patient on chlorthalidone as it is more long acting. Also counseled the patient on diet and exercise.   -Chlorthalidone 25mg  qd  OSA The patient states  that she has been feeling very tired. She mentioned that she snores at night, has a large neck circumference, and has been having apnic episodes. The patient has a score of 9 on STOP BANG. Due to the patient's risk of further increasing hypertension and pulmonary hypertension have placed referral to pulmonary medicine for possible OSA diagnosis.   -Requested patient to follow up with pulmonology for sleep study  -Counseled patient on diet and exercise   Microcytic anemia  The patient was previously on ferrous sulfate 325mg  daily, but was discontinued due to constipation and concern that the patient did not truly have iron deficiency anemia   Assessment and plan  Repeat CBC was obtained which showed low hb=10.4, iron=17, low iron sat=6, normal tibc=303, and normal ferritin=84. Will order hemoglobin electrophoresis to evaluate for possible thalassemia.   Abdominal pain The patient presented with left lower quadrant pain that radiates to the back. The patient states that she has been having the pain intermittently for several months now. The pain is 9/10 intensity, sharp and achy in nature, and accompanied by vomiting diarrhea and constipation. Tylenol and ibuprofen did not help the pain. She has lost 4lbs since previous visit in April 2019.  Abdominal xray done in March 2019 showed nonobstructive bowel gas pattern that may indicate constipation. Pelvic ultrasound done in May 2018 showed a right ovarian cyst without abnormalities in uterus or left ovary. CT abdomen done in May 2018 showed a 61mm left midpole nephrolithiasis without ureteral stones or hydronephrosis.   The patient does not have kidney stones or  diverticulosis. Less likely that the patient has ovarian torsion or abscess as the patient has had chronic abdominal pain. Recommended follow up with gastroenterology and ob/gyn.   -Follow up with gastroenterology -Follow up with Ob/gyn  Health Maintenance Pap Smear that the patient will get  at ob/gyn appointment   Patient discussed with Dr. Cleda Daub

## 2017-07-11 ENCOUNTER — Ambulatory Visit (INDEPENDENT_AMBULATORY_CARE_PROVIDER_SITE_OTHER): Payer: BLUE CROSS/BLUE SHIELD | Admitting: Internal Medicine

## 2017-07-11 ENCOUNTER — Other Ambulatory Visit: Payer: Self-pay

## 2017-07-11 VITALS — BP 153/79 | HR 93 | Temp 98.2°F | Wt 309.6 lb

## 2017-07-11 DIAGNOSIS — E282 Polycystic ovarian syndrome: Secondary | ICD-10-CM | POA: Diagnosis not present

## 2017-07-11 DIAGNOSIS — G4733 Obstructive sleep apnea (adult) (pediatric): Secondary | ICD-10-CM | POA: Diagnosis not present

## 2017-07-11 DIAGNOSIS — J45998 Other asthma: Secondary | ICD-10-CM

## 2017-07-11 DIAGNOSIS — I10 Essential (primary) hypertension: Secondary | ICD-10-CM

## 2017-07-11 DIAGNOSIS — R109 Unspecified abdominal pain: Secondary | ICD-10-CM

## 2017-07-11 DIAGNOSIS — R1032 Left lower quadrant pain: Secondary | ICD-10-CM

## 2017-07-11 DIAGNOSIS — D509 Iron deficiency anemia, unspecified: Secondary | ICD-10-CM

## 2017-07-11 DIAGNOSIS — R197 Diarrhea, unspecified: Secondary | ICD-10-CM

## 2017-07-11 DIAGNOSIS — Z79899 Other long term (current) drug therapy: Secondary | ICD-10-CM

## 2017-07-11 DIAGNOSIS — R112 Nausea with vomiting, unspecified: Secondary | ICD-10-CM | POA: Diagnosis not present

## 2017-07-11 DIAGNOSIS — G8929 Other chronic pain: Secondary | ICD-10-CM

## 2017-07-11 DIAGNOSIS — Z0000 Encounter for general adult medical examination without abnormal findings: Secondary | ICD-10-CM

## 2017-07-11 MED ORDER — CHLORTHALIDONE 25 MG PO TABS: 25.0000 mg | ORAL_TABLET | Freq: Every day | ORAL | 1 refills | Status: DC

## 2017-07-11 NOTE — Patient Instructions (Signed)
It was a pleasure to see you today Ms. Manson Passey. Please make the following changes:  Your blood pressure is high- please start taking chlorthalidone 25mg  once a day  Follow up with sleep medicine physician  Follow up with ob/gyn  If you have any questions or concerns, please call our clinic at [phone] between 9am-5pm and after hours call [phone] and ask for the internal medicine resident on call. If you feel you are having a medical emergency please call 911.   Thank you, we look forward to help you remain healthy!  Lorenso Courier, MD Internal Medicine PGY1

## 2017-07-12 LAB — CBC WITH DIFFERENTIAL/PLATELET
Basophils Absolute: 0.1 10*3/uL (ref 0.0–0.2)
Basos: 1 %
EOS (ABSOLUTE): 0.4 10*3/uL (ref 0.0–0.4)
EOS: 4 %
HEMOGLOBIN: 10.4 g/dL — AB (ref 11.1–15.9)
Hematocrit: 35.3 % (ref 34.0–46.6)
Immature Grans (Abs): 0 10*3/uL (ref 0.0–0.1)
Immature Granulocytes: 0 %
LYMPHS ABS: 4 10*3/uL — AB (ref 0.7–3.1)
Lymphs: 34 %
MCH: 19.3 pg — AB (ref 26.6–33.0)
MCHC: 29.5 g/dL — AB (ref 31.5–35.7)
MCV: 65 fL — AB (ref 79–97)
MONOCYTES: 6 %
MONOS ABS: 0.7 10*3/uL (ref 0.1–0.9)
NEUTROS ABS: 6.7 10*3/uL (ref 1.4–7.0)
Neutrophils: 55 %
Platelets: 388 10*3/uL (ref 150–450)
RBC: 5.4 x10E6/uL — ABNORMAL HIGH (ref 3.77–5.28)
RDW: 18.9 % — AB (ref 12.3–15.4)
WBC: 11.9 10*3/uL — ABNORMAL HIGH (ref 3.4–10.8)

## 2017-07-12 LAB — IRON AND TIBC
IRON SATURATION: 6 % — AB (ref 15–55)
Iron: 17 ug/dL — ABNORMAL LOW (ref 27–159)
Total Iron Binding Capacity: 303 ug/dL (ref 250–450)
UIBC: 286 ug/dL (ref 131–425)

## 2017-07-12 LAB — FERRITIN: Ferritin: 84 ng/mL (ref 15–150)

## 2017-07-12 NOTE — Assessment & Plan Note (Signed)
Pap Smear that the patient will get at ob/gyn appointment

## 2017-07-12 NOTE — Assessment & Plan Note (Signed)
Patient recommended to follow up with ob/gyn regarding right ovarian cyst and pcos. Patient should continue spironolactone 25mg  daily.

## 2017-07-12 NOTE — Assessment & Plan Note (Signed)
The patient states that she has been feeling very tired. She mentioned that she snores at night, has a large neck circumference, and has been having apnic episodes. The patient has a score of 9 on STOP BANG. Due to the patient's risk of further increasing hypertension and pulmonary hypertension have placed referral to pulmonary medicine for possible OSA diagnosis.   -Requested patient to follow up with pulmonology for sleep study  -Counseled patient on diet and exercise

## 2017-07-12 NOTE — Assessment & Plan Note (Signed)
The patient presented with a blood pressure of 153/79. The patient takes amlodipine 10mg  qd and spironolactone 25mg  qd. She states that she has been taking all of her medication and not missed any doses.   Assessment The patient continues to be hypertensive despite being on two anti-hypertensives. Will continue spironolactone due to the patient's PCOS. Will start the patient on chlorthalidone as it is more long acting. Also counseled the patient on diet and exercise.   -Chlorthalidone 25mg  qd

## 2017-07-13 DIAGNOSIS — R1032 Left lower quadrant pain: Secondary | ICD-10-CM | POA: Insufficient documentation

## 2017-07-13 NOTE — Assessment & Plan Note (Addendum)
The patient presented with left lower quadrant pain that radiates to the back. The patient states that she has been having the pain intermittently for several months now. The pain is 9/10 intensity, sharp and achy in nature, and accompanied by vomiting diarrhea and constipation. Tylenol and ibuprofen did not help the pain. She has lost 4lbs since previous visit in April 2019.  Abdominal xray done in March 2019 showed nonobstructive bowel gas pattern that may indicate constipation. Pelvic ultrasound done in May 2018 showed a right ovarian cyst without abnormalities in uterus or left ovary. CT abdomen done in May 2018 showed a 30mm left midpole nephrolithiasis without ureteral stones or hydronephrosis.   The patient does not have kidney stones or diverticulosis. Less likely that the patient has ovarian torsion or abscess as the patient has had chronic abdominal pain. Recommended follow up with gastroenterology and ob/gyn.   -Follow up with gastroenterology -Follow up with Ob/gyn

## 2017-07-13 NOTE — Assessment & Plan Note (Signed)
The patient was previously on ferrous sulfate 325mg  daily, but was discontinued due to constipation and concern that the patient did not truly have iron deficiency anemia   Assessment and plan  Repeat CBC was obtained which showed low hb=10.4, iron=17, low iron sat=6, normal tibc=303, and normal ferritin=84. Will order hemoglobin electrophoresis to evaluate for possible thalassemia.

## 2017-07-13 NOTE — Progress Notes (Signed)
Internal Medicine Clinic Attending  Case discussed with Dr. Delma Officer at the time of the visit.  We reviewed the resident's history and exam and pertinent patient test results.  I agree with the assessment, diagnosis, and plan of care documented in the resident's note with the following additions: Patient does have Iron deficiency from her iron studies, ferritin is likely falsely normal.  Given that she failed to improve with oral iron she needs referral to GI to evaluate for iron malabsorption.  We discussed that it would be fine to obtain Hemoglobin electrophoresis to rule out concomitant thalassemia.

## 2017-07-16 ENCOUNTER — Telehealth: Payer: Self-pay | Admitting: Internal Medicine

## 2017-07-16 NOTE — Telephone Encounter (Signed)
WOULD LIKE RESULTS FROM URINE TEST AND LAB WORK

## 2017-07-21 ENCOUNTER — Ambulatory Visit: Payer: BLUE CROSS/BLUE SHIELD

## 2017-07-22 ENCOUNTER — Encounter: Payer: Self-pay | Admitting: Internal Medicine

## 2017-07-22 ENCOUNTER — Ambulatory Visit (INDEPENDENT_AMBULATORY_CARE_PROVIDER_SITE_OTHER): Payer: BLUE CROSS/BLUE SHIELD | Admitting: Internal Medicine

## 2017-07-22 VITALS — BP 162/86 | HR 82 | Temp 98.5°F | Wt 310.7 lb

## 2017-07-22 DIAGNOSIS — A084 Viral intestinal infection, unspecified: Secondary | ICD-10-CM

## 2017-07-22 DIAGNOSIS — I10 Essential (primary) hypertension: Secondary | ICD-10-CM

## 2017-07-22 MED ORDER — PROMETHAZINE HCL 25 MG PO TABS: 25.0000 mg | ORAL_TABLET | Freq: Once | ORAL | Status: DC

## 2017-07-22 MED ORDER — SODIUM CHLORIDE 0.9 % IV BOLUS
1000.0000 mL | Freq: Once | INTRAVENOUS | Status: AC
  Administered 2017-07-22: 1000 mL via INTRAVENOUS

## 2017-07-22 MED ORDER — PROMETHAZINE HCL 12.5 MG PO TABS: 12.5000 mg | ORAL_TABLET | Freq: Four times a day (QID) | ORAL | 0 refills | Status: DC | PRN

## 2017-07-22 MED ORDER — SUCRALFATE 1 GM/10ML PO SUSP: 1.0000 g | Freq: Four times a day (QID) | ORAL | 1 refills | Status: DC

## 2017-07-22 MED ORDER — PROMETHAZINE HCL 25 MG PO TABS
25.0000 mg | ORAL_TABLET | Freq: Once | ORAL | Status: AC
  Administered 2017-07-22: 25 mg via ORAL

## 2017-07-22 NOTE — Assessment & Plan Note (Addendum)
Patient presenting with 4 days of nausea, vomiting, epigastric abdominal pain, decreased p.o. intake, and headache.  Endorses generalized weakness. Denies diarrhea, fevers, chills or body aches.  Patient symptoms seem consistent with a viral gastroenteritis.  Vital signs are stable.  Patient is afebrile, hypertensive, and has a normal heart rate.  She is nontoxic-appearing.  Abdominal exam with epigastric tenderness to palpation and normal bowel sounds.   Even though the patient is hemodynamically stable I believe she will benefit from 1 L normal saline bolus due to her generalized weakness and decreased p.o. intake for the past several days. Have given her phenergan 25 mg while in clinic waiting for IVF, which improved her nausea but did not relieve her epigastric abdominal pain.   Patient does endorse a 1 to 2-week history of epigastric abdominal pain and given her frequent NSAID use I am concerned that she may be developing gastritis or peptic ulcer disease.  It is somewhat difficult to tease out her symptoms given her acute illness at this time.  If her viral syndrome resolves, but she continues to have epigastric abdominal pain I would consider a GI referral for possible EGD.  In the interim, I am going to try Carafate 4 times daily in addition to the patient's Nexium.  Plan: -1 liter NS bolus -Will prescribe Phenergan 12.5 mg every 6 hours as needed for nausea or vomiting, as Zofran gives her headache -Carafate 4 times daily as needed for epigastric discomfort -Continue Nexium daily -Reassured patient this is likely a self-limited illness and she will feel improved by the end of the week -If her symptoms worsen or fail to improve she has been instructed to return to clinic -At follow-up visit please reevaluate the patient's epigastric abdominal pain in more detail and refer to GI if clinically warranted

## 2017-07-22 NOTE — Patient Instructions (Addendum)
Ms. Spitale,   I believe your symptoms are due to viral gastroenteritis and will resolve with time.  He received IV fluids and anti-nausea medicine in clinic.   I have sent two medications to your pharmacy. Phenergan and Carafate.   Take the Phenergran for nausea and vomiting as needed.   Take the Carafate for the burning abdominal pain. You can take it as needed up to four times daily.   Return to clinic if your symptoms do not improve.   I would like you to return to clinic and have a PCP visit to discuss your blood pressure.

## 2017-07-22 NOTE — Assessment & Plan Note (Signed)
BP Readings from Last 3 Encounters:  07/22/17 (!) 162/86  07/11/17 (!) 153/79  06/05/17 (!) 153/98  Not well controlled on current regimen. Patient states she checks her BP daily at work and it is typically elevated in the 150s. She is acutely ill this morning and had not taken her BP meds yet this morning. Discussed with patient that she will need to follow up with her PCP in 1 month or so to discuss her BP management.   Plan: -No changes to regimen at this time -Follow up with PCP in ~1 month

## 2017-07-22 NOTE — Progress Notes (Signed)
   CC: headache, abdominal pain, vomiting    HPI:  [name] is a 37 y.o. female with a past medical history listed below here today with a chief complaint of headache, nausea, vomiting, and abdominal pain for 4 days duration. The patient states she has had a posterior right sided headache on and off for four days duration. This is not like her typical migraine, which is usually frontal, right sided and throbbing in nature. She has been taking ibuprofen and execedrin for the headache, which helps temporarily. She has been previously prescribed sumatriptan, but does not like how the medication makes her feel so does not that for her migraines. Her headache has also been associated with epigastric abdominal pain, nausea, and vomiting. She describes the epigastric abdominal pain as burning, non-radiating. She has been unable to keep down a meal or fluids the past several days. She is prescribed zofran, but has not taken any because that medication gives her a headache. She endorses generalized weakness and fatigue. No body aches or myalgias, no fever or chills. Denies diarrhea and has been constipated, but had a normal bowel movement yesterday.    Patient does say that the epigastric abdominal pain has been occurring for 1 to 2 weeks duration.  She does take frequent NSAIDs for her intermittent headaches.  At times she will take them 3-4 times a day.  She denies melena.  Patient takes Nexium daily for GERD.  States epigastric pain is worse at night when she is lying down.  For details of today's visit and the status of her chronic medical issues please refer to the assessment and plan.  Past Medical History:  Diagnosis Date  . Anemia   . Asthma   . Hypertension   . Obesity    Review of Systems:   Review of Systems  Constitutional: Positive for malaise/fatigue. Negative for chills and fever.  Gastrointestinal: Positive for abdominal pain, nausea and vomiting. Negative for diarrhea.    Neurological: Positive for weakness and headaches.    Physical Exam:  Vitals:   07/22/17 0917  BP: (!) 162/86  Pulse: 82  Temp: 98.5 F (36.9 C)  TempSrc: Oral  SpO2: 98%  Weight: (!) 310 lb 11.2 oz (140.9 kg)   Physical Exam  Constitutional: She is oriented to person, place, and time.  Non-toxic appearance. No distress.  HENT:  Head: Normocephalic and atraumatic.  Mouth/Throat: Oropharynx is clear and moist.  Eyes: Pupils are equal, round, and reactive to light. Conjunctivae and EOM are normal. No scleral icterus.  Neck: Normal range of motion. Neck supple.  Cardiovascular: Normal rate, regular rhythm and normal heart sounds.  Pulmonary/Chest: Effort normal and breath sounds normal.  Abdominal: Soft. Bowel sounds are normal. There is tenderness in the epigastric area. There is no guarding.  Musculoskeletal: Normal range of motion. She exhibits no edema.  Neurological: She is alert and oriented to person, place, and time. She has normal strength. No cranial nerve deficit or sensory deficit.  Skin: Skin is warm and dry.  Psychiatric: She has a normal mood and affect.    Assessment & Plan:   See Encounters Tab for problem based charting.  Patient discussed with Dr. Sandre Kitty

## 2017-07-24 NOTE — Telephone Encounter (Signed)
I have been trying to get in touch with patient. Will keep trying.

## 2017-07-25 NOTE — Progress Notes (Signed)
Internal Medicine Clinic Attending  Case discussed with Dr. LaCroce  at the time of the visit.  We reviewed the resident's history and exam and pertinent patient test results.  I agree with the assessment, diagnosis, and plan of care documented in the resident's note.  Alexander N Raines, MD   

## 2017-07-29 ENCOUNTER — Encounter: Payer: Self-pay | Admitting: Pulmonary Disease

## 2017-07-29 ENCOUNTER — Ambulatory Visit: Payer: BLUE CROSS/BLUE SHIELD | Admitting: Pulmonary Disease

## 2017-07-29 DIAGNOSIS — F5104 Psychophysiologic insomnia: Secondary | ICD-10-CM

## 2017-07-29 DIAGNOSIS — G4733 Obstructive sleep apnea (adult) (pediatric): Secondary | ICD-10-CM

## 2017-07-29 DIAGNOSIS — G4700 Insomnia, unspecified: Secondary | ICD-10-CM | POA: Insufficient documentation

## 2017-07-29 NOTE — Patient Instructions (Signed)
You seem to have insomnia as well as obstructive sleep apnea.  Schedule home sleep study.  Keep sleep diary for 2 to 4 weeks  Rules of sleep hygiene were discussed  - light exercise -avoid caffeinated beverages - no more than 20 mins staying awake in bed, if not asleep, get out of bed & reading or light music - No TV or computer games at bedtime.   -Trial of melatonin 5 to 10 mg 1 hour before bedtime

## 2017-07-29 NOTE — Assessment & Plan Note (Signed)
Given excessive daytime somnolence, narrow pharyngeal exam, witnessed apneas & loud snoring, obstructive sleep apnea is very likely & an overnight polysomnogram will be scheduled as a home study. The pathophysiology of obstructive sleep apnea , it's cardiovascular consequences & modes of treatment including CPAP were discused with the patient in detail & they evidenced understanding.  Pretest probability is high 

## 2017-07-29 NOTE — Assessment & Plan Note (Signed)
Keep sleep diary for 2 to 4 weeks -appears to be sleep onset issues, does not seem to be delayed sleep phase syndrome since this is a poor recent onset  Rules of sleep hygiene were discussed  - light exercise -avoid caffeinated beverages - no more than 20 mins staying awake in bed, if not asleep, get out of bed & reading or light music - No TV or computer games at bedtime.   -Trial of melatonin 5 to 10 mg 1 hour before bedtime

## 2017-07-29 NOTE — Progress Notes (Signed)
Subjective:    Patient ID: Victoria Booth, female    DOB: October 20, 1980, 37 y.o.   MRN: [id number]  HPI  Chief Complaint  Patient presents with  . Sleep Consult    Referred by PCP to rule out OSA. Denies ever having a SS done before. States she has high blood pressure and wants to make sure OSA is not causing it.     37 year old female left [employer] employee presents for evaluation of sleep disordered breathing and insomnia. She has difficult to control hypertension requiring 3 medications.  She reports difficulty getting to sleep and also maintaining her sleep.  Her fianc has noted deep breathing and wheezing during sleep he has also noted loud snoring for the last 2 years and witnessed apneas.  Epworth sleepiness score is 6 but she seems to be under reporting.  She reports sleepiness that creeps into her workday. Bedtime is around 9 PM, sleep latency can be up to 3 to 4 hours, TV stays on through the night, she sleeps on her back with 2-3 pillows and then turns over on her side, on weekends with them again be as late as 3 AM, she reports 3 or more nocturnal awakenings including nocturia and is out of bed 6:54 AM on weekdays and around 8 AM on weekends feeling tired with dryness of mouth but denies headaches.  There is no history suggestive of cataplexy or parasomnias She does report episodes of sleep paralysis especially in the last few months where she feels that she cannot move her arms or legs while lying in bed. She is to drink about 5 cups of coffee daily to keep herself awake but she quit cold [place] about 3 months ago and has gone through withdrawal symptoms including headaches and tremors  She has mild asthma which is controlled on albuterol which she takes no seasonal basis     Past Medical History:  Diagnosis Date  . Anemia   . Asthma   . Hypertension   . Obesity    Past Surgical History:  Procedure Laterality Date  . TOENAIL EXCISION      Allergies  Allergen Reactions    . Penicillins Shortness Of Breath and Rash    Has patient had a PCN reaction causing immediate rash, facial/tongue/throat swelling, SOB or lightheadedness with hypotension:yes Has patient had a PCN reaction causing severe rash involving mucus membranes or skin necrosis: no Has patient had a PCN reaction that required hospitalization : no Has patient had a PCN reaction occurring within the last 10 years:no  If all of the above answers are "NO", then may proceed with Cephalosporin use.   . Shellfish Allergy Swelling  . Sulfa Antibiotics Hives  . Lisinopril     Cough   . Iodine Rash      Social History   Socioeconomic History  . Marital status: Single    Spouse name: Not on file  . Number of children: Not on file  . Years of education: Not on file  . Highest education level: Not on file  Occupational History  . Not on file  Social Needs  . Financial resource strain: Not on file  . Food insecurity:    Worry: Not on file    Inability: Not on file  . Transportation needs:    Medical: Not on file    Non-medical: Not on file  Tobacco Use  . Smoking status: Former Smoker    Packs/day: 0.10    Types: Cigarettes  Last attempt to quit: 04/06/2015    Years since quitting: 2.3  . Smokeless tobacco: Never Used  . Tobacco comment: 1 cig / month.  Substance and Sexual Activity  . Alcohol use: No    Alcohol/week: 0.0 oz  . Drug use: No  . Sexual activity: Not on file  Lifestyle  . Physical activity:    Days per week: Not on file    Minutes per session: Not on file  . Stress: Not on file  Relationships  . Social connections:    Talks on phone: Not on file    Gets together: Not on file    Attends religious service: Not on file    Active member of club or organization: Not on file    Attends meetings of clubs or organizations: Not on file    Relationship status: Not on file  . Intimate partner violence:    Fear of current or ex partner: Not on file    Emotionally abused:  Not on file    Physically abused: Not on file    Forced sexual activity: Not on file  Other Topics Concern  . Not on file  Social History Narrative  . Not on file     Family History  Problem Relation Age of Onset  . Diabetes Mother   . Hypertension Mother      Review of Systems   Positive for shortness of breath with activity, acid, headaches and anxiety   Constitutional: negative for anorexia, fevers and sweats  Eyes: negative for irritation, redness and visual disturbance  Ears, nose, mouth, throat, and face: negative for earaches, epistaxis, nasal congestion and sore throat  Respiratory: negative for cough,  sputum and wheezing  Cardiovascular: negative for chest pain,  lower extremity edema, orthopnea, palpitations and syncope  Gastrointestinal: negative for abdominal pain, constipation, diarrhea, melena, nausea and vomiting  Genitourinary:negative for dysuria, frequency and hematuria  Hematologic/lymphatic: negative for bleeding, easy bruising and lymphadenopathy  Musculoskeletal:negative for arthralgias, muscle weakness and stiff joints  Neurological: negative for coordination problems, gait problems and weakness  Endocrine: negative for diabetic symptoms including polydipsia, polyuria and weight loss     Objective:   Physical Exam   Gen. Pleasant, obese, in no distress, normal affect ENT - no lesions, no post nasal drip, class 2 airway Neck: No JVD, no thyromegaly, no carotid bruits Lungs: no use of accessory muscles, no dullness to percussion, decreased without rales or rhonchi  Cardiovascular: Rhythm regular, heart sounds  normal, no murmurs or gallops, no peripheral edema Abdomen: soft and non-tender, no hepatosplenomegaly, BS normal. Musculoskeletal: No deformities, no cyanosis or clubbing Neuro:  alert, non focal, no tremors        Assessment & Plan:

## 2017-08-05 ENCOUNTER — Telehealth: Payer: Self-pay | Admitting: Pulmonary Disease

## 2017-08-05 NOTE — Telephone Encounter (Signed)
Called pt & told her that one of [place] would be calling her in the next few days to set up her sleep study.  She just wanted to make sure since [employer] sent her the notice she didn't need to provide it to [place].  Nothing further needed at this time.

## 2017-08-05 NOTE — Telephone Encounter (Signed)
Will route this message to the PCCs who handle the HSTs.

## 2017-08-11 ENCOUNTER — Other Ambulatory Visit: Payer: Self-pay

## 2017-08-11 ENCOUNTER — Encounter (HOSPITAL_COMMUNITY): Payer: Self-pay | Admitting: Emergency Medicine

## 2017-08-11 ENCOUNTER — Ambulatory Visit (HOSPITAL_COMMUNITY)
Admission: EM | Admit: 2017-08-11 | Discharge: 2017-08-11 | Disposition: A | Discharge status: Home / Self Care | Payer: BLUE CROSS/BLUE SHIELD | Attending: Family Medicine | Admitting: Family Medicine

## 2017-08-11 DIAGNOSIS — R51 Headache: Secondary | ICD-10-CM

## 2017-08-11 DIAGNOSIS — H60391 Other infective otitis externa, right ear: Secondary | ICD-10-CM

## 2017-08-11 DIAGNOSIS — H9201 Otalgia, right ear: Secondary | ICD-10-CM | POA: Diagnosis not present

## 2017-08-11 MED ORDER — FLUTICASONE PROPIONATE 50 MCG/ACT NA SUSP: 2.0000 | Freq: Every day | NASAL | 0 refills | Status: DC

## 2017-08-11 MED ORDER — NEOMYCIN-POLYMYXIN-HC 3.5-10000-1 OT SUSP: 4.0000 [drp] | Freq: Four times a day (QID) | OTIC | 0 refills | Status: AC

## 2017-08-11 MED ORDER — KETOROLAC TROMETHAMINE 60 MG/2ML IM SOLN
INTRAMUSCULAR | Status: AC
  Filled 2017-08-11: qty 2

## 2017-08-11 MED ORDER — KETOROLAC TROMETHAMINE 60 MG/2ML IM SOLN
60.0000 mg | Freq: Once | INTRAMUSCULAR | Status: AC
  Administered 2017-08-11: 60 mg via INTRAMUSCULAR

## 2017-08-11 NOTE — ED Triage Notes (Signed)
Pt reports right ear pain that started two weeks ago.

## 2017-08-11 NOTE — ED Provider Notes (Signed)
[facility]    CSN: [id number] Arrival date & time: 08/11/17  1829     History   Chief Complaint Chief Complaint  Patient presents with  . Otalgia    right    HPI Victoria Booth is a 37 y.o. female.   37 year old female comes in for 2-week history of right ear pain.  States now pain is severe, causing headache.  Has had ear popping during yawning, coughing, and more before during that time.  Denies decrease in hearing.  Does have rhinorrhea, nasal congestion at baseline due to allergic rhinitis.  Denies cough, fever, chills, night sweats.  Does use cotton swabs regularly to clean the ear canal.  Denies recent swimming, travels.  Took leftover Keflex without relief.     Past Medical History:  Diagnosis Date  . Anemia   . Asthma   . Hypertension   . Obesity     Patient Active Problem List   Diagnosis Date Noted  . Insomnia 07/29/2017  . Left lower quadrant abdominal pain of unknown etiology 07/13/2017  . OSA (obstructive sleep apnea) 06/05/2017  . Fatigue 04/25/2017  . Major depressive disorder 03/21/2017  . Migraine 10/24/2016  . Hematuria 09/24/2016  . Prediabetes 08/21/2016  . Lumbar back pain 08/21/2016  . Allergic rhinitis 06/18/2016  . Left flank pain 06/05/2016  . Obesity, morbid, BMI 50 or higher (HCC) 06/05/2016  . PCOS (polycystic ovarian syndrome) 04/05/2016  . Essential hypertension, benign 12/01/2014  . Asthma, chronic 12/01/2014  . Microcytic anemia 12/01/2014  . Preventative health care 12/01/2014    Past Surgical History:  Procedure Laterality Date  . TOENAIL EXCISION      OB History   None      Home Medications    Prior to Admission medications   Medication Sig Start Date End Date Taking? Authorizing Provider  albuterol (PROVENTIL HFA;VENTOLIN HFA) 108 (90 Base) MCG/ACT inhaler Inhale 1-2 puffs into the lungs every 6 (six) hours as needed for wheezing or shortness of breath. 05/01/16  Yes Domenick Gong, MD  amLODipine  (NORVASC) 10 MG tablet Take 10 mg by mouth daily.   Yes [provider]  chlorthalidone (HYGROTON) 25 MG tablet Take 1 tablet (25 mg total) by mouth daily. 07/11/17 08/11/17 Yes Chundi, Vahini, MD  spironolactone (ALDACTONE) 25 MG tablet Take 1 tablet (25 mg total) by mouth daily. 06/05/17  Yes Reymundo Poll, MD  ferrous sulfate 325 (65 FE) MG tablet Take 1 tablet (325 mg total) by mouth daily. 04/24/17 04/24/18  Arnetha Courser, MD  fluticasone (FLONASE) 50 MCG/ACT nasal spray Place 2 sprays into both nostrils daily. 08/11/17   Cathie Hoops, Amy V, PA-C  neomycin-polymyxin-hydrocortisone (CORTISPORIN) 3.5-10000-1 OTIC suspension Place 4 drops into the right ear 4 (four) times daily for 7 days. 08/11/17 08/18/17  Belinda Fisher, PA-C  promethazine (PHENERGAN) 12.5 MG tablet Take 1 tablet (12.5 mg total) by mouth every 6 (six) hours as needed for nausea, vomiting or refractory nausea / vomiting. 07/22/17   Lacroce, Ames Coupe, MD  sertraline (ZOLOFT) 50 MG tablet Take 1 tablet (50 mg total) by mouth daily. 04/18/17 05/18/17  Beola Cord, MD  sucralfate (CARAFATE) 1 GM/10ML suspension Take 10 mLs (1 g total) by mouth 4 (four) times daily. 07/22/17 07/22/18  Toney Rakes, MD    Family History Family History  Problem Relation Age of Onset  . Diabetes Mother   . Hypertension Mother     Social History Social History   Tobacco Use  .  Smoking status: Former Smoker    Packs/day: 0.10    Types: Cigarettes    Last attempt to quit: 04/06/2015    Years since quitting: 2.3  . Smokeless tobacco: Never Used  . Tobacco comment: 1 cig / month.  Substance Use Topics  . Alcohol use: No    Alcohol/week: 0.0 oz  . Drug use: No     Allergies   Penicillins; Shellfish allergy; Sulfa antibiotics; Lisinopril; and Iodine   Review of Systems Review of Systems  Reason unable to perform ROS: See HPI as above.     Physical Exam Triage Vital Signs ED Triage Vitals  Enc Vitals Group     BP 08/11/17 1913 (!)  156/101     Pulse Rate 08/11/17 1913 79     Resp --      Temp 08/11/17 1913 98 F (36.7 C)     Temp Source 08/11/17 1913 Oral     SpO2 08/11/17 1913 100 %     Weight --      Height --      Head Circumference --      Peak Flow --      Pain Score 08/11/17 1912 9     Pain Loc --      Pain Edu? --      Excl. in GC? --    No data found.  Updated Vital Signs BP (!) 156/101 (BP Location: Left Wrist)   Pulse 79   Temp 98 F (36.7 C) (Oral)   SpO2 100%   Physical Exam  Constitutional: She is oriented to person, place, and time. She appears well-developed and well-nourished. No distress.  HENT:  Head: Normocephalic and atraumatic.  Right Ear: Tympanic membrane, external ear and ear canal normal. Tympanic membrane is not erythematous and not bulging.  Left Ear: Tympanic membrane, external ear and ear canal normal. Tympanic membrane is not erythematous and not bulging.  Nose: Mucosal edema present. Right sinus exhibits frontal sinus tenderness. Right sinus exhibits no maxillary sinus tenderness. Left sinus exhibits frontal sinus tenderness. Left sinus exhibits no maxillary sinus tenderness.  Mouth/Throat: Uvula is midline, oropharynx is clear and moist and mucous membranes are normal.  Tenderness to palpation of right tragus.  Mild erythema of the ear canal. Cerumen in ear canal, TM partially visible without obvious erythema or bulging.  Due to ear canal pain, unable to tolerate irrigation.   Eyes: Pupils are equal, round, and reactive to light. Conjunctivae are normal.  Neck: Normal range of motion. Neck supple.  Cardiovascular: Normal rate, regular rhythm and normal heart sounds. Exam reveals no gallop and no friction rub.  No murmur heard. Pulmonary/Chest: Effort normal and breath sounds normal. She has no decreased breath sounds. She has no wheezes. She has no rhonchi. She has no rales.  Lymphadenopathy:    She has no cervical adenopathy.  Neurological: She is alert and oriented to  person, place, and time.  Skin: Skin is warm and dry.  Psychiatric: She has a normal mood and affect. Her behavior is normal. Judgment normal.     UC Treatments / Results  Labs (all labs ordered are listed, but only abnormal results are displayed) Labs Reviewed - No data to display  EKG None  Radiology No results found.  Procedures Procedures (including critical care time)  Medications Ordered in UC Medications  ketorolac (TORADOL) injection 60 mg (60 mg Intramuscular Given 08/11/17 1945)    Initial Impression / Assessment and Plan / UC Course  I  have reviewed the triage vital signs and the nursing notes.  Pertinent labs & imaging results that were available during my care of the patient were reviewed by me and considered in my medical decision making (see chart for details).    Will treat for otitis externa with Cortisporin. Although with cerumen, no impaction, TM still visible, less suspicious for cerumen causing ear pain.  Discussed possible eustachian tube dysfunction.  Start Flonase as directed.  Continue antihistamine.  Return precautions given.  Patient expresses understanding and agrees to plan.  Final Clinical Impressions(s) / UC Diagnoses   Final diagnoses:  Other infective acute otitis externa of right ear  Right ear pain    ED Prescriptions    Medication Sig Dispense Auth. Provider   neomycin-polymyxin-hydrocortisone (CORTISPORIN) 3.5-10000-1 OTIC suspension Place 4 drops into the right ear 4 (four) times daily for 7 days. 5.6 mL Yu, Amy V, PA-C   fluticasone (FLONASE) 50 MCG/ACT nasal spray Place 2 sprays into both nostrils daily. 1 g Threasa Alpha, [place] 08/11/17 2045

## 2017-08-11 NOTE — Discharge Instructions (Signed)
Start Cortisporin for possible outer ear infection.  Refrain from cotton swab use.  Ear pain could also be caused by eustachian tube dysfunction.  Start Flonase as directed.  Continue allergy medicine.  You can use hydrogen peroxide to break down the earwax in your ear. Follow up here or with PCP if symptoms not improving.

## 2017-08-15 NOTE — Progress Notes (Signed)
CC: Blood pressure follow up  HPI:  [name] is a 37 y.o. essential hypertension, OSA, PCOS, major depressive disorder, and migraines who presents to the clinic for blood pressure follow up. Please see problem based charting for evaluation, assessment, and plan.   Past Medical History:  Diagnosis Date  . Anemia   . Asthma   . Hypertension   . Obesity    Review of Systems:    Review of Systems  Constitutional: Negative for weight loss.  Cardiovascular: Negative for chest pain.  Gastrointestinal: Positive for abdominal pain.  Musculoskeletal: Positive for joint pain (left shoulder).  Psychiatric/Behavioral: Negative for depression and suicidal ideas.   Physical Exam:  Vitals:   08/18/17 1557  BP: (!) 151/84  Pulse: 80  Temp: 97.8 F (36.6 C)  TempSrc: Oral  SpO2: 100%  Weight: (!) 312 lb 11.2 oz (141.8 kg)  Height: 5\' 7"  (1.702 m)   Physical Exam  Constitutional: She appears well-developed and well-nourished. No distress.  HENT:  Head: Normocephalic and atraumatic.  Eyes: Conjunctivae are normal.  Cardiovascular: Normal rate, regular rhythm and normal heart sounds.  Respiratory: Effort normal and breath sounds normal. No respiratory distress. She has no wheezes.  GI: Soft. Bowel sounds are normal. She exhibits no distension. There is no tenderness.  Musculoskeletal: She exhibits edema (1+ pitting edema) and tenderness (left shoulder).  3/5 strength in left upper extremity, 5/5 in right upper extremity. Tingling in left upper extremity. No numbness. Mildly decreased sensation in left upper arm.   Neurological: She is alert.  Skin: She is not diaphoretic. No erythema.  Psychiatric: She has a normal mood and affect. Her behavior is normal. Judgment and thought content normal.    Assessment & Plan:   See Encounters Tab for problem based charting.  Essential Hypertension The patient presented with blood pressure of . The patient is currently taking  spironolactone 25mg  qd, chlorthalidone 25 mg, and amlodipine 10mg  qd.   Assessment and plan The patient's blood pressure remains uncontrolled.  Although part of the reason for elevated blood pressure may be due to acute pain from her recent fall the patient consistently has been having increased blood pressure.  Therefore, we will increase chlorthalidone from 25 mg daily to 50 mg daily.  We will follow-up with the patient in 1 month.  Major Depressive Disorder  PHQ9=5. The patient notes that her mood has significantly improved and she feels that she is neither at a extreme high or extremely low. She is currently taking sertraline 50mg  qd.  Assessment and plan The patient's mood has significantly improved and her current regimen seems to be working well for her.  Therefore, will encourage the patient to continue sertraline 50 mg daily and have encouraged patient to continue counseling.  Iron Deficiency Anemia  The patient has a TIBC=303, Iron=17, Iron saturation=6, Ferritin=84, hb=10.4, hct=35.3, mcv=65, mch=19.3.   Assessment and plan  The patient's iron level continues to be low despites oral iron therapy for 3 months.  The patient states that she took iron supplements daily without missing any doses although it caused her severe constipation.  Fecal immunochemical testing to evaluate for lower GI bleed.  Will also evaluate for malabsorption as a possible cause of iron deficiency. We will also set up iron infusions for the patient as she is not responding to oral iron.  -hemoglobin electrophoresis to evaluate for possible concrement and thalassemia -continue po iron or can consider ferraheme injections -We will consider GI referral if malabsorption  is ruled out  Left shoulder pain The patient states that she slipped and she fell on her back last Friday 08/15/2017 while at [facility].  The patient states that since the fall she feels  that she has left shoulder pain that is 8/10 in  intensity.  Over the past day she feels that the pain has radiated down her left arm.  She has some accompanying tingling in her left forearm.  Assessment and plan The patient will be sent for left shoulder and left elbow x-ray to evaluate for possible left scapular fracture and left radius/ulnar fracture.  The patient likely has muscular contusion that is contributing to the pain.  She is left-hand dominant.   -Recommended the patient rest the left arm and ice it -Return to clinic if the shoulder pain is not alleviated  Patient discussed with Dr. Criselda Peaches

## 2017-08-18 ENCOUNTER — Encounter: Payer: Self-pay | Admitting: Internal Medicine

## 2017-08-18 ENCOUNTER — Ambulatory Visit (HOSPITAL_COMMUNITY)
Admission: RE | Admit: 2017-08-18 | Discharge: 2017-08-18 | Disposition: A | Discharge status: Home / Self Care | Payer: BLUE CROSS/BLUE SHIELD | Source: Ambulatory Visit | Attending: Internal Medicine | Admitting: Internal Medicine

## 2017-08-18 ENCOUNTER — Other Ambulatory Visit: Payer: Self-pay

## 2017-08-18 ENCOUNTER — Ambulatory Visit (INDEPENDENT_AMBULATORY_CARE_PROVIDER_SITE_OTHER): Payer: BLUE CROSS/BLUE SHIELD | Admitting: Internal Medicine

## 2017-08-18 VITALS — BP 151/84 | HR 80 | Temp 97.8°F | Ht 67.0 in | Wt 312.7 lb

## 2017-08-18 DIAGNOSIS — G4733 Obstructive sleep apnea (adult) (pediatric): Secondary | ICD-10-CM

## 2017-08-18 DIAGNOSIS — Z79899 Other long term (current) drug therapy: Secondary | ICD-10-CM | POA: Diagnosis not present

## 2017-08-18 DIAGNOSIS — I10 Essential (primary) hypertension: Secondary | ICD-10-CM | POA: Diagnosis not present

## 2017-08-18 DIAGNOSIS — R109 Unspecified abdominal pain: Secondary | ICD-10-CM

## 2017-08-18 DIAGNOSIS — M7989 Other specified soft tissue disorders: Secondary | ICD-10-CM | POA: Diagnosis not present

## 2017-08-18 DIAGNOSIS — G8911 Acute pain due to trauma: Secondary | ICD-10-CM

## 2017-08-18 DIAGNOSIS — G43909 Migraine, unspecified, not intractable, without status migrainosus: Secondary | ICD-10-CM

## 2017-08-18 DIAGNOSIS — S4992XA Unspecified injury of left shoulder and upper arm, initial encounter: Secondary | ICD-10-CM | POA: Diagnosis not present

## 2017-08-18 DIAGNOSIS — M25512 Pain in left shoulder: Secondary | ICD-10-CM | POA: Insufficient documentation

## 2017-08-18 DIAGNOSIS — E282 Polycystic ovarian syndrome: Secondary | ICD-10-CM

## 2017-08-18 DIAGNOSIS — D509 Iron deficiency anemia, unspecified: Secondary | ICD-10-CM | POA: Insufficient documentation

## 2017-08-18 DIAGNOSIS — F339 Major depressive disorder, recurrent, unspecified: Secondary | ICD-10-CM

## 2017-08-18 DIAGNOSIS — S59902A Unspecified injury of left elbow, initial encounter: Secondary | ICD-10-CM | POA: Diagnosis not present

## 2017-08-18 DIAGNOSIS — F321 Major depressive disorder, single episode, moderate: Secondary | ICD-10-CM

## 2017-08-18 DIAGNOSIS — M25522 Pain in left elbow: Secondary | ICD-10-CM | POA: Diagnosis not present

## 2017-08-18 MED ORDER — CHLORTHALIDONE 50 MG PO TABS: 50.0000 mg | ORAL_TABLET | Freq: Every day | ORAL | 0 refills | Status: DC

## 2017-08-18 NOTE — Assessment & Plan Note (Signed)
The patient states that she slipped and she fell on her back last Friday 08/15/2017 while at [facility].  The patient states that since the fall she feels  that she has left shoulder pain that is 8/10 in intensity.  Over the past day she feels that the pain has radiated down her left arm.  She has some accompanying tingling in her left forearm.  Assessment and plan The patient will be sent for left shoulder and left elbow x-ray to evaluate for possible left scapular fracture and left radius/ulnar fracture.  The patient likely has muscular contusion that is contributing to the pain.  She is left-hand dominant.   -Recommended the patient rest the left arm and ice it -Return to clinic if the shoulder pain is not alleviated

## 2017-08-18 NOTE — Assessment & Plan Note (Signed)
The patient has a TIBC=303, Iron=17, Iron saturation=6, Ferritin=84, hb=10.4, hct=35.3, mcv=65, mch=19.3.   Assessment and plan  The patient's iron level continues to be low despites oral iron therapy for 3 months.  The patient states that she took iron supplements daily without missing any doses although it caused her severe constipation.  Fecal immunochemical testing to evaluate for lower GI bleed.  Will also evaluate for malabsorption as a possible cause of iron deficiency. We will also set up iron infusions for the patient as she is not responding to oral iron.  -hemoglobin electrophoresis to evaluate for possible concrement and thalassemia -continue po iron or can consider ferraheme injections -We will consider GI referral if malabsorption is ruled out

## 2017-08-18 NOTE — Patient Instructions (Addendum)
It was a pleasure to see you today Ms. Browtn. Please make the following changes:  -Please get left shoulder and elbow xray -Please rest your shoulder and arm -Increase chlorthalidone from 25mg  daily to 50mg  daily -Please return to clinic sooner if you do not have relief for your left shoulder pain  If you have any questions or concerns, please call our clinic at [phone] between 9am-5pm and after hours call [phone] and ask for the internal medicine resident on call. If you feel you are having a medical emergency please call 911.   Thank you, we look forward to help you remain healthy!  Lorenso Courier, MD Internal Medicine PGY1

## 2017-08-18 NOTE — Assessment & Plan Note (Signed)
The patient presented with blood pressure of . The patient is currently taking spironolactone 25mg  qd, chlorthalidone 25 mg, and amlodipine 10mg  qd.   Assessment and plan The patient's blood pressure remains uncontrolled.  Although part of the reason for elevated blood pressure may be due to acute pain from her recent fall the patient consistently has been having increased blood pressure.  Therefore, we will increase chlorthalidone from 25 mg daily to 50 mg daily.  We will follow-up with the patient in 1 month.

## 2017-08-18 NOTE — Assessment & Plan Note (Signed)
PHQ9=5. The patient notes that her mood has significantly improved and she feels that she is neither at a extreme high or extremely low. She is currently taking sertraline 50mg  qd.  Assessment and plan The patient's mood has significantly improved and her current regimen seems to be working well for her.  Therefore, will encourage the patient to continue sertraline 50 mg daily and have encouraged patient to continue counseling.

## 2017-08-19 ENCOUNTER — Other Ambulatory Visit: Payer: Self-pay | Admitting: Internal Medicine

## 2017-08-19 DIAGNOSIS — D509 Iron deficiency anemia, unspecified: Secondary | ICD-10-CM

## 2017-08-19 DIAGNOSIS — G4733 Obstructive sleep apnea (adult) (pediatric): Secondary | ICD-10-CM | POA: Diagnosis not present

## 2017-08-19 NOTE — Progress Notes (Unsigned)
Setup feraheme injections for patient at short stay

## 2017-08-20 ENCOUNTER — Telehealth: Payer: Self-pay | Admitting: *Deleted

## 2017-08-20 LAB — HEMOGLOBINOPATHY EVALUATION
HGB C: 0 %
HGB S: 0 %
HGB VARIANT: 0 %
Hemoglobin A2 Quantitation: 1.3 % — ABNORMAL LOW (ref 1.8–3.2)
Hemoglobin F Quantitation: 0 % (ref 0.0–2.0)
Hgb A: 98.7 % (ref 96.4–98.8)

## 2017-08-20 LAB — CELIAC DISEASE PANEL
ENDOMYSIAL IGA: NEGATIVE
IGA/IMMUNOGLOBULIN A, SERUM: 234 mg/dL (ref 87–352)

## 2017-08-20 NOTE — Telephone Encounter (Signed)
Saw Dr Delma Officer 7/8 @ [facility].

## 2017-08-20 NOTE — Telephone Encounter (Signed)
Called pt - no answer; left to give me a call back. Feraheme infusion has been scheduled for next Tuesday 7/16 @ 1000 AM her @ [facility] short stay. Pt will schedule 2nd infusion herself after the first one.

## 2017-08-20 NOTE — Progress Notes (Signed)
Thanks for the update

## 2017-08-20 NOTE — Telephone Encounter (Signed)
Return call from pt - appt information given to pt; also instructed to arrive 15 mins early to register at Admissions. Voiced understanding.

## 2017-08-20 NOTE — Telephone Encounter (Signed)
-----   Message from Lorenso Courier, MD sent at 08/19/2017  5:58 PM EDT ----- 2 sets thanks! ----- Message ----- From: Hassan Buckler, RN Sent: 08/19/2017   2:17 PM To: Lorenso Courier, MD  Hi Dr Delma Officer, will pt be receiving 1 or 2 feraheme injections? Thanks Rivka Barbara I'm doing fine thanks! ----- Message ----- From: Lorenso Courier, MD Sent: 08/19/2017   1:17 PM To: Hassan Buckler, RN  Hi Glenda,   Hope you're doing well! Please call the patient and setup an appointment for the patient to get feraheme injections. The number is [phone]. I have already placed the order. Thanks!  Vahini

## 2017-08-21 ENCOUNTER — Other Ambulatory Visit: Payer: Self-pay | Admitting: *Deleted

## 2017-08-21 DIAGNOSIS — G4733 Obstructive sleep apnea (adult) (pediatric): Secondary | ICD-10-CM

## 2017-08-22 ENCOUNTER — Encounter: Payer: BLUE CROSS/BLUE SHIELD | Admitting: Internal Medicine

## 2017-08-22 NOTE — Progress Notes (Signed)
Internal Medicine Clinic Attending  Case discussed with Dr. Chundi at the time of the visit.  We reviewed the resident's history and exam and pertinent patient test results.  I agree with the assessment, diagnosis, and plan of care documented in the resident's note. 

## 2017-08-25 ENCOUNTER — Telehealth: Payer: Self-pay | Admitting: Pulmonary Disease

## 2017-08-25 DIAGNOSIS — G4733 Obstructive sleep apnea (adult) (pediatric): Secondary | ICD-10-CM | POA: Diagnosis not present

## 2017-08-25 NOTE — Telephone Encounter (Signed)
Per RA, HST showed mild OSA with 6 events per hour. Recommends weight loss and repeat again in 6 months. Otherwise recommends auto CPAP.

## 2017-08-26 ENCOUNTER — Ambulatory Visit (HOSPITAL_COMMUNITY): Payer: BLUE CROSS/BLUE SHIELD | Attending: Internal Medicine

## 2017-08-28 NOTE — Telephone Encounter (Signed)
Spoke with patient. She is aware of results. She wishes to repeat the study in 6 months. Reminder has been placed on her chart to place order in 6 months.   Nothing else needed at time of call.

## 2017-09-02 ENCOUNTER — Other Ambulatory Visit: Payer: BLUE CROSS/BLUE SHIELD

## 2017-09-02 ENCOUNTER — Encounter (HOSPITAL_COMMUNITY): Payer: BLUE CROSS/BLUE SHIELD

## 2017-09-02 DIAGNOSIS — D509 Iron deficiency anemia, unspecified: Secondary | ICD-10-CM

## 2017-09-08 ENCOUNTER — Other Ambulatory Visit: Payer: Self-pay

## 2017-09-08 DIAGNOSIS — R072 Precordial pain: Secondary | ICD-10-CM | POA: Diagnosis not present

## 2017-09-08 DIAGNOSIS — I499 Cardiac arrhythmia, unspecified: Secondary | ICD-10-CM | POA: Diagnosis not present

## 2017-09-08 DIAGNOSIS — D649 Anemia, unspecified: Secondary | ICD-10-CM | POA: Diagnosis not present

## 2017-09-08 DIAGNOSIS — R197 Diarrhea, unspecified: Secondary | ICD-10-CM | POA: Diagnosis not present

## 2017-09-08 DIAGNOSIS — R079 Chest pain, unspecified: Secondary | ICD-10-CM | POA: Diagnosis not present

## 2017-09-08 DIAGNOSIS — R109 Unspecified abdominal pain: Secondary | ICD-10-CM | POA: Diagnosis not present

## 2017-09-08 DIAGNOSIS — R1012 Left upper quadrant pain: Secondary | ICD-10-CM | POA: Diagnosis not present

## 2017-09-08 MED ORDER — ALBUTEROL SULFATE HFA 108 (90 BASE) MCG/ACT IN AERS: 1.0000 | INHALATION_SPRAY | Freq: Four times a day (QID) | RESPIRATORY_TRACT | 3 refills | Status: DC | PRN

## 2017-09-08 NOTE — Telephone Encounter (Signed)
Patient currently in [place] for work and cannot find albuterol inhaler. Requesting refill at [facility] an [address]. Patient able to speak in full sentences without difficulty. No audible wheezing or SHOB noted. States allergies are acting up causing nasal sx also. Patient takes claritin daily. Discussed trying nasal saline lavage as well. Patient very appreciative. Kinnie Feil, RN, BSN

## 2017-09-08 NOTE — Telephone Encounter (Signed)
albuterol (PROVENTIL HFA;VENTOLIN HFA) 108 (90 Base) MCG/ACT inhaler, refill request @ [facility] on [address].

## 2017-09-08 NOTE — Telephone Encounter (Signed)
Attempted to notify patient that refill has been sent to pharmacy in [place], however, no answer and VM Box is full and cannot accept messages at this time. Kinnie Feil, RN, BSN

## 2017-09-08 NOTE — Telephone Encounter (Signed)
Pt calling back yet, pt is asking if there is an inhaler that she can buy over the counter, she is having a real bad time with her asthma, pt contact [phone].

## 2017-09-09 NOTE — Telephone Encounter (Signed)
I have had issues trying to reach the patient in the past as well. Hopefully, we can try again. Thanks!

## 2017-09-09 NOTE — Addendum Note (Signed)
Addended by: Bufford Spikes on: 09/09/2017 02:43 PM   Modules accepted: Orders

## 2017-09-10 ENCOUNTER — Other Ambulatory Visit: Payer: Self-pay | Admitting: Internal Medicine

## 2017-09-10 ENCOUNTER — Other Ambulatory Visit: Payer: Self-pay

## 2017-09-10 ENCOUNTER — Ambulatory Visit (INDEPENDENT_AMBULATORY_CARE_PROVIDER_SITE_OTHER): Payer: BLUE CROSS/BLUE SHIELD | Admitting: Internal Medicine

## 2017-09-10 ENCOUNTER — Encounter: Payer: Self-pay | Admitting: Internal Medicine

## 2017-09-10 VITALS — BP 147/91 | HR 68 | Temp 97.6°F | Ht 66.0 in | Wt 322.0 lb

## 2017-09-10 DIAGNOSIS — E669 Obesity, unspecified: Secondary | ICD-10-CM

## 2017-09-10 DIAGNOSIS — J45909 Unspecified asthma, uncomplicated: Secondary | ICD-10-CM | POA: Diagnosis not present

## 2017-09-10 DIAGNOSIS — G4733 Obstructive sleep apnea (adult) (pediatric): Secondary | ICD-10-CM

## 2017-09-10 DIAGNOSIS — G43909 Migraine, unspecified, not intractable, without status migrainosus: Secondary | ICD-10-CM | POA: Diagnosis not present

## 2017-09-10 DIAGNOSIS — K219 Gastro-esophageal reflux disease without esophagitis: Secondary | ICD-10-CM

## 2017-09-10 DIAGNOSIS — Z79899 Other long term (current) drug therapy: Secondary | ICD-10-CM

## 2017-09-10 DIAGNOSIS — R7303 Prediabetes: Secondary | ICD-10-CM

## 2017-09-10 DIAGNOSIS — Z6841 Body Mass Index (BMI) 40.0 and over, adult: Secondary | ICD-10-CM

## 2017-09-10 DIAGNOSIS — I10 Essential (primary) hypertension: Secondary | ICD-10-CM

## 2017-09-10 LAB — FECAL OCCULT BLOOD, IMMUNOCHEMICAL: Fecal Occult Bld: NEGATIVE

## 2017-09-10 MED ORDER — PANTOPRAZOLE SODIUM 40 MG PO TBEC: 40.0000 mg | DELAYED_RELEASE_TABLET | Freq: Every day | ORAL | 6 refills | Status: DC

## 2017-09-10 NOTE — Patient Instructions (Addendum)
It was nice seeing you today. Thank you for choosing [facility] for your Primary Care.   Sounds like you're having severe reflux.  We will start you on an acid reducing medicine called Protonix.  Take this medicine daily. You can also take Tums up to 3 times a day and Zantac up to twice a day if you need it.   I have sent Protonix to your pharmacy. Take this medicine 30 minutes before you eat in the morning.   Take all 3 medicines like clock work to try and calm down your symptoms.   Take Miralax to get your bowels moving.   If you aren't feeling any better by Friday, give [place] a call and come back to see [place].    FOLLOW-UP INSTRUCTIONS When: as needed on Friday For: reflux  Please contact the clinic if you have any problems, or need to be seen sooner.

## 2017-09-10 NOTE — Assessment & Plan Note (Addendum)
Presents for evaluation of a 4-day history of acute onset shortness of breath and chest discomfort after eating.  She also endorses a morning cough, daily nausea, a gurgling sensation in her throat when she lies down, constipation, dyspnea on exertion, orthopnea, and one episode of PND.  Symptoms not relieved with albuterol, Nexium, or Maalox.  She takes milk of magnesium occasionally.  Had a 1 bowel movement with bright red blood on the toilet paper.  Control includes angina, MI, PE, CHF, aortic aneurysm. However, patient presented to the ED 2 days ago for these symptoms and work-up was unremarkable.  Troponin negative, d-dimer negative, stool Hemoccult negative, chest x-ray unremarkable without enlarged cardiac silhouette or enlarged aortic notch, CT abdomen with left renal calculus and right ovarian cyst.  Symptoms most consistent with severe GERD.   Plan: - Protonix 40mg  daily - Zantac BID - Tums TID - Patient instructed to take above medicines like clock work for the next few days and return Friday if symptoms have not improved - Miralax prn, goal is daily BMs - Consider GI referral for upper endoscopy and h.pylori testing if symptoms do not improve.

## 2017-09-10 NOTE — Progress Notes (Signed)
   CC: Shortness of breath and chest discomfort  HPI:  [name] is a 37 y.o. female with hypertension, migraines, asthma, OSA, prediabetes, obesity who presents for evaluation of 4-day history of acute onset shortness of breath and chest discomfort.  Please see encounter tab for full details of HPI.  Past Medical History:  Diagnosis Date  . Anemia   . Asthma   . Hypertension   . Obesity     Physical Exam:  Vitals:   09/10/17 1457 09/10/17 1534  BP: (!) 171/102 (!) 147/91  Pulse: 80 68  Temp: 97.6 F (36.4 C)   TempSrc: Oral   SpO2: 99% 100%  Weight: (!) 322 lb (146.1 kg)   Height: 5\' 6"  (1.676 m)    Gen: Well appearing, NAD CV: RRR, no murmurs, no JVD Pulm: Normal effort, CTA throughout, no wheezing Abd: Soft, LLQ tenderness, ND, normal BS.  Ext: Warm, no edema   Assessment & Plan:   See Encounters Tab for problem based charting.  Patient seen with Dr. Criselda Peaches

## 2017-09-14 NOTE — Progress Notes (Signed)
Internal Medicine Clinic Attending  I saw and evaluated the patient.  I personally confirmed the key portions of the history and exam documented by Dr. Vogel and I reviewed pertinent patient test results.  The assessment, diagnosis, and plan were formulated together and I agree with the documentation in the resident's note.  

## 2017-09-14 NOTE — Addendum Note (Signed)
Addended by: Debe Coder B on: 09/14/2017 09:34 AM   Modules accepted: Level of Service

## 2017-09-29 ENCOUNTER — Other Ambulatory Visit: Payer: Self-pay

## 2017-09-29 ENCOUNTER — Encounter: Payer: Self-pay | Admitting: Internal Medicine

## 2017-09-29 ENCOUNTER — Ambulatory Visit (INDEPENDENT_AMBULATORY_CARE_PROVIDER_SITE_OTHER): Payer: BLUE CROSS/BLUE SHIELD | Admitting: Internal Medicine

## 2017-09-29 VITALS — BP 144/88 | HR 91 | Temp 98.4°F | Ht 67.0 in | Wt 303.6 lb

## 2017-09-29 DIAGNOSIS — R197 Diarrhea, unspecified: Secondary | ICD-10-CM

## 2017-09-29 DIAGNOSIS — R112 Nausea with vomiting, unspecified: Secondary | ICD-10-CM | POA: Diagnosis not present

## 2017-09-29 DIAGNOSIS — R1032 Left lower quadrant pain: Secondary | ICD-10-CM

## 2017-09-29 DIAGNOSIS — G43909 Migraine, unspecified, not intractable, without status migrainosus: Secondary | ICD-10-CM

## 2017-09-29 DIAGNOSIS — G8929 Other chronic pain: Secondary | ICD-10-CM

## 2017-09-29 DIAGNOSIS — I10 Essential (primary) hypertension: Secondary | ICD-10-CM

## 2017-09-29 DIAGNOSIS — J45909 Unspecified asthma, uncomplicated: Secondary | ICD-10-CM

## 2017-09-29 DIAGNOSIS — R5383 Other fatigue: Secondary | ICD-10-CM

## 2017-09-29 DIAGNOSIS — R531 Weakness: Secondary | ICD-10-CM

## 2017-09-29 LAB — POCT URINALYSIS DIPSTICK
Blood, UA: NEGATIVE
Glucose, UA: NEGATIVE
Ketones, UA: NEGATIVE
NITRITE UA: NEGATIVE
PH UA: 5.5 (ref 5.0–8.0)
PROTEIN UA: NEGATIVE
Spec Grav, UA: 1.02 (ref 1.010–1.025)
Urobilinogen, UA: 0.2 E.U./dL

## 2017-09-29 MED ORDER — ONDANSETRON 4 MG PO TBDP: 4.0000 mg | ORAL_TABLET | Freq: Three times a day (TID) | ORAL | 0 refills | Status: AC | PRN

## 2017-09-29 MED ORDER — DICYCLOMINE HCL 10 MG PO CAPS: 10.0000 mg | ORAL_CAPSULE | Freq: Four times a day (QID) | ORAL | 0 refills | Status: DC

## 2017-09-29 NOTE — Patient Instructions (Signed)
It was a pleasure to see you today Ms. Manson Passey. Please make the following changes:  -Please use Bentyl 10 mg 4 times a day for abdominal cramping -Please use Zofran 4 mg every 8 hours as needed for your nausea -Return to clinic in the next 2 to 3 days if your symptoms do not resolve -Follow with gastroenterology   If you have any questions or concerns, please call our clinic at [phone] between 9am-5pm and after hours call [phone] and ask for the internal medicine resident on call. If you feel you are having a medical emergency please call 911.   Thank you, we look forward to help you remain healthy!  Lorenso Courier, MD Internal Medicine PGY2

## 2017-09-29 NOTE — Progress Notes (Addendum)
   CC: Abdominal pain, nausea, diarrhea  HPI:  Victoria Booth is a 37 y.o. with hypertension, asthma, and migraines who presents with abdominal pain, nausea, diarrhea. Please see problem based charting for evaluation, assessment, and plan.  Past Medical History:  Diagnosis Date  . Anemia   . Asthma   . Hypertension   . Obesity    Review of Systems:    Has nausea, vomiting, diarrhea, abdominal pain, left flank pain, fatigue, weakness Denies fever  Physical Exam: Vitals:   09/29/17 1333  BP: (!) 144/88  Pulse: 91  Temp: 98.4 F (36.9 C)  TempSrc: Oral  Weight: (!) 303 lb 9.6 oz (137.7 kg)  Height: 5\' 7"  (1.702 m)   Physical Exam  Constitutional: She appears well-developed and well-nourished. No distress.  HENT:  Head: Normocephalic and atraumatic.  Eyes: Conjunctivae are normal.  Cardiovascular: Normal rate, regular rhythm and normal heart sounds.  Respiratory: Effort normal and breath sounds normal. No respiratory distress. She has no wheezes.  GI: Soft. Bowel sounds are normal. She exhibits no distension and no mass. There is tenderness (Suprapubic and epigastric to palpation). There is no rebound and no guarding.  Gurgling sound audible in the left upper quadrant  Musculoskeletal: She exhibits no edema.  Neurological: She is alert.  Skin: She is not diaphoretic. No erythema.  Psychiatric: She has a normal mood and affect. Her behavior is normal. Judgment and thought content normal.    Assessment & Plan:   See Encounters Tab for problem based charting.  Abdominal pain The patient states that she has been having abdominal pains for several months now that has worsened over the past month.  She describes the pain as 7/10 in intensity, achy/cramping in nature, present over the epigastric and suprapubic areas.  She denies any blood in her stools, dark stools, mucus. She has not had any recent travel.   She was seen 3 weeks ago regarding same symptoms and was  thought to have severe GERD and was treated with Protonix 40 mg daily, Zantac twice daily, Tums 3 times daily and MiraLAX as needed.  The patient states that despite these interventions she has not had improvement of symptoms.  Since her last visit (09/10/2017) the patient has lost 19 pounds.   The patient states that she started developing watery diarrhea on Friday and has had it for the last 3 days.  She states that she has also had nausea and vomiting.  The patient had 2 episodes of vomiting yesterday and one episode today.  He has not been able to keep any food or liquids down.  Assessment and plan The patient has had a chronic abdominal pain for several months now.  Her symptoms and physical exam are consistent with PUD versus IBS.  Therefore, referred the patient to GI at previous visit.  However, the patient has a overdue gastroenterology bill that she has not paid for and therefore not able to be seen.  Will check with clinic staff as to whether it is possible to refer the patient to a different gastroenterology practice.  She will likely need endoscopy and colonoscopy.  UA showed negative nitrites and leukocytes.  -Prescribed Zofran 4 mg every 8 hours as needed for 5 days -Bentyl 10 mg 4 times daily as needed -Recommended the patient return to clinic in a week if her symptoms do not resolve -Referral to gastroenterology   Patient discussed with Dr. Heide Spark

## 2017-09-30 LAB — URINALYSIS, ROUTINE W REFLEX MICROSCOPIC
Bilirubin, UA: NEGATIVE
Glucose, UA: NEGATIVE
Ketones, UA: NEGATIVE
Leukocytes, UA: NEGATIVE
Nitrite, UA: NEGATIVE
Protein, UA: NEGATIVE
RBC, UA: NEGATIVE
Specific Gravity, UA: 1.021 (ref 1.005–1.030)
Urobilinogen, Ur: 0.2 mg/dL (ref 0.2–1.0)
pH, UA: 5.5 (ref 5.0–7.5)

## 2017-09-30 NOTE — Progress Notes (Signed)
Internal Medicine Clinic Attending  Case discussed with Dr. Chundi at the time of the visit.  We reviewed the resident's history and exam and pertinent patient test results.  I agree with the assessment, diagnosis, and plan of care documented in the resident's note. 

## 2017-09-30 NOTE — Assessment & Plan Note (Signed)
The patient states that she has been having abdominal pains for several months now that has worsened over the past month.  She describes the pain as 7/10 in intensity, achy/cramping in nature, present over the epigastric and suprapubic areas.  She denies any blood in her stools, dark stools, mucus. She has not had any recent travel.   She was seen 3 weeks ago regarding same symptoms and was thought to have severe GERD and was treated with Protonix 40 mg daily, Zantac twice daily, Tums 3 times daily and MiraLAX as needed.  The patient states that despite these interventions she has not had improvement of symptoms.  Since her last visit (09/10/2017) the patient has lost 19 pounds.   The patient states that she started developing watery diarrhea on Friday and has had it for the last 3 days.  She states that she has also had nausea and vomiting.  The patient had 2 episodes of vomiting yesterday and one episode today.  He has not been able to keep any food or liquids down.  Assessment and plan The patient has had a chronic abdominal pain for several months now.  Her symptoms and physical exam are consistent with PUD versus IBS.  Therefore, referred the patient to GI at previous visit.  However, the patient has a overdue gastroenterology bill that she has not paid for and therefore not able to be seen.  Will check with clinic staff as to whether it is possible to refer the patient to a different gastroenterology practice.  She will likely need endoscopy and colonoscopy.  UA showed negative nitrites and leukocytes.  -Prescribed Zofran 4 mg every 8 hours as needed for 5 days -Bentyl 10 mg 4 times daily as needed -Recommended the patient return to clinic in a week if her symptoms do not resolve -Referral to gastroenterology

## 2017-10-01 ENCOUNTER — Other Ambulatory Visit: Payer: Self-pay | Admitting: Internal Medicine

## 2017-10-01 LAB — URINE CULTURE

## 2017-10-01 MED ORDER — CEPHALEXIN 500 MG PO CAPS: 500.0000 mg | ORAL_CAPSULE | Freq: Two times a day (BID) | ORAL | 0 refills | Status: DC

## 2017-10-01 NOTE — Progress Notes (Signed)
Patient's urine culture returned positive for group b strep. First line is amoxicillin for simple cystitis, but the patient as a insulin allergy that caused her shortness of breath and lightheadedness.  Therefore start gave the patient cephalexin 500 mg twice daily for 5 days as cephalasporins usually do not have same adverse reactions as penicillins.   Lorenso Courier, MD Internal Medicine PGY2 [phone] 10/01/2017, 5:26 PM

## 2017-10-02 ENCOUNTER — Encounter: Payer: Self-pay | Admitting: Internal Medicine

## 2017-10-02 NOTE — Progress Notes (Unsigned)
Patient returned call today.  Informed her of her urine culture results and that Keflex was sent to her pharmacy.  She stated that she would pick it up and start using it today.  Recommended the patient return to clinic if her symptoms are persistent and continue to worsen.  Lorenso Courier, MD Internal Medicine PGY2 [phone] 10/02/2017, 9:59 AM

## 2017-10-03 ENCOUNTER — Emergency Department (HOSPITAL_COMMUNITY)
Admission: EM | Admit: 2017-10-03 | Discharge: 2017-10-04 | Disposition: A | Discharge status: Home / Self Care | Payer: BLUE CROSS/BLUE SHIELD | Attending: Emergency Medicine | Admitting: Emergency Medicine

## 2017-10-03 ENCOUNTER — Encounter: Payer: Self-pay | Admitting: Internal Medicine

## 2017-10-03 ENCOUNTER — Other Ambulatory Visit: Payer: Self-pay

## 2017-10-03 ENCOUNTER — Telehealth: Payer: Self-pay | Admitting: *Deleted

## 2017-10-03 ENCOUNTER — Encounter (HOSPITAL_COMMUNITY): Payer: Self-pay | Admitting: Emergency Medicine

## 2017-10-03 DIAGNOSIS — Z79899 Other long term (current) drug therapy: Secondary | ICD-10-CM | POA: Insufficient documentation

## 2017-10-03 DIAGNOSIS — Z87891 Personal history of nicotine dependence: Secondary | ICD-10-CM | POA: Diagnosis not present

## 2017-10-03 DIAGNOSIS — I10 Essential (primary) hypertension: Secondary | ICD-10-CM | POA: Diagnosis not present

## 2017-10-03 DIAGNOSIS — R109 Unspecified abdominal pain: Secondary | ICD-10-CM

## 2017-10-03 DIAGNOSIS — J45909 Unspecified asthma, uncomplicated: Secondary | ICD-10-CM | POA: Diagnosis not present

## 2017-10-03 DIAGNOSIS — R1084 Generalized abdominal pain: Secondary | ICD-10-CM | POA: Insufficient documentation

## 2017-10-03 DIAGNOSIS — N390 Urinary tract infection, site not specified: Secondary | ICD-10-CM | POA: Diagnosis not present

## 2017-10-03 LAB — I-STAT BETA HCG BLOOD, ED (MC, WL, AP ONLY): I-stat hCG, quantitative: <5 m[IU]/mL (ref <5)

## 2017-10-03 LAB — URINALYSIS, ROUTINE W REFLEX MICROSCOPIC
Bilirubin Urine: NEGATIVE
GLUCOSE, UA: NEGATIVE mg/dL
Hgb urine dipstick: NEGATIVE
Ketones, ur: NEGATIVE mg/dL
LEUKOCYTES UA: NEGATIVE
Nitrite: NEGATIVE
PH: 5 (ref 5.0–8.0)
Protein, ur: NEGATIVE mg/dL
Specific Gravity, Urine: 1.021 (ref 1.005–1.030)

## 2017-10-03 LAB — COMPREHENSIVE METABOLIC PANEL
ALT: 25 U/L (ref 0–44)
AST: 21 U/L (ref 15–41)
Albumin: 3.6 g/dL (ref 3.5–5.0)
Alkaline Phosphatase: 97 U/L (ref 38–126)
Anion gap: 9 (ref 5–15)
BUN: 5 mg/dL — AB (ref 6–20)
CHLORIDE: 107 mmol/L (ref 98–111)
CO2: 24 mmol/L (ref 22–32)
CREATININE: 0.71 mg/dL (ref 0.44–1.00)
Calcium: 9.4 mg/dL (ref 8.9–10.3)
GFR calc Af Amer: >60 mL/min (ref >60)
Glucose, Bld: 102 mg/dL — ABNORMAL HIGH (ref 70–99)
Potassium: 3.9 mmol/L (ref 3.5–5.1)
SODIUM: 140 mmol/L (ref 135–145)
Total Bilirubin: 0.6 mg/dL (ref 0.3–1.2)
Total Protein: 7.9 g/dL (ref 6.5–8.1)

## 2017-10-03 LAB — CBC
HEMATOCRIT: 38.1 % (ref 36.0–46.0)
Hemoglobin: 10.6 g/dL — ABNORMAL LOW (ref 12.0–15.0)
MCH: 18.4 pg — AB (ref 26.0–34.0)
MCHC: 27.8 g/dL — ABNORMAL LOW (ref 30.0–36.0)
MCV: 66 fL — AB (ref 78.0–100.0)
Platelets: 391 10*3/uL (ref 150–400)
RBC: 5.77 MIL/uL — AB (ref 3.87–5.11)
RDW: 19.7 % — AB (ref 11.5–15.5)
WBC: 9.6 10*3/uL (ref 4.0–10.5)

## 2017-10-03 LAB — LIPASE, BLOOD: LIPASE: 26 U/L (ref 11–51)

## 2017-10-03 MED ORDER — FAMOTIDINE IN NACL 20-0.9 MG/50ML-% IV SOLN
20.0000 mg | Freq: Once | INTRAVENOUS | Status: AC
  Administered 2017-10-03: 20 mg via INTRAVENOUS
  Filled 2017-10-03: qty 50

## 2017-10-03 MED ORDER — ACETAMINOPHEN 325 MG PO TABS
650.0000 mg | ORAL_TABLET | Freq: Once | ORAL | Status: AC
  Administered 2017-10-03: 650 mg via ORAL
  Filled 2017-10-03: qty 2

## 2017-10-03 MED ORDER — SODIUM CHLORIDE 0.9 % IV SOLN
2.0000 g | Freq: Once | INTRAVENOUS | Status: AC
  Administered 2017-10-03: 2 g via INTRAVENOUS
  Filled 2017-10-03: qty 20

## 2017-10-03 MED ORDER — ONDANSETRON HCL 4 MG/2ML IJ SOLN
4.0000 mg | Freq: Once | INTRAMUSCULAR | Status: AC
  Administered 2017-10-03: 4 mg via INTRAVENOUS
  Filled 2017-10-03: qty 2

## 2017-10-03 MED ORDER — DIPHENHYDRAMINE HCL 50 MG/ML IJ SOLN
25.0000 mg | Freq: Once | INTRAMUSCULAR | Status: AC
  Administered 2017-10-03: 25 mg via INTRAVENOUS
  Filled 2017-10-03: qty 1

## 2017-10-03 MED ORDER — SODIUM CHLORIDE 0.9 % IV BOLUS
1000.0000 mL | Freq: Once | INTRAVENOUS | Status: AC
  Administered 2017-10-03: 1000 mL via INTRAVENOUS

## 2017-10-03 NOTE — Telephone Encounter (Signed)
Patient called in states took first dose of keflex at noon yesterday and second dose at 8 pm. Woke at midnight 2/2 hands itching. Worse this AM and now with legs itching also. Denies any other symptoms. Spoke with Dr. Delma Officer and patient instructed to stop keflex as something else will be sent to pharmacy. Patient states understanding. Keflex added to med allergy list. Patient reports no change in how she is feeling; still with no appetite. Kinnie Feil, RN, BSN

## 2017-10-03 NOTE — Telephone Encounter (Signed)
Returned call to patient. States she's feeling very weak and lightheaded. Has vomited 3 times today. Had no food yesterday and is unable to keep soup down today. Spoke with PCP. Patient states she does not feel stable enough to drive herself to [facility]. Will have friend take her to ED. Kinnie Feil, RN, BSN

## 2017-10-03 NOTE — ED Provider Notes (Signed)
[facility] EMERGENCY DEPARTMENT Provider Note  CSN: [id number] Arrival date & time: 10/03/17  1443  History   Chief Complaint Chief Complaint  Patient presents with  . Abdominal Pain    HPI Victoria Booth is a 37 y.o. female with a medical history of HTN, OSA, PCOS and GERD who presented to the ED for abdominal pain x3 months. Victoria Booth describes intermittent, generalized cramping abdominal pain that has been unchanged. Associated symptoms: nausea. Denies fever, chills, vomiting, dysuria, hematuria, vaginal pain, vaginal discharge or changes in bowel habits. Victoria Booth reports being seen by Victoria Booth PCP for this issue multiple times and states that Victoria Booth is supposed to follow-up with GI, but is unable to follow-up at the referred clinic because Victoria Booth has outstanding bills there.  Additional history obtained by medical chart. Patient last seen by PCP on 09/29/17 for abdominal pain. Patient was treated for UTI despite having no urinary complaints and normal UA. However, urine culture grew bacteria. Victoria Booth reports that Victoria Booth PCP sent Victoria Booth to the ED for antibiotic change as Victoria Booth has PCN allergy and had allergic reaction (rash) to Keflex which was prescribed as treatment.   Past Medical History:  Diagnosis Date  . Anemia   . Asthma   . Hypertension   . Obesity     Patient Active Problem List   Diagnosis Date Noted  . Iron deficiency anemia 08/18/2017  . Acute pain of left shoulder 08/18/2017  . Insomnia 07/29/2017  . Left lower quadrant abdominal pain of unknown etiology 07/13/2017  . OSA (obstructive sleep apnea) 06/05/2017  . Fatigue 04/25/2017  . Major depressive disorder 03/21/2017  . Migraine 10/24/2016  . Hematuria 09/24/2016  . Prediabetes 08/21/2016  . Lumbar back pain 08/21/2016  . Allergic rhinitis 06/18/2016  . Left flank pain 06/05/2016  . Obesity, morbid, BMI 50 or higher (HCC) 06/05/2016  . PCOS (polycystic ovarian syndrome) 04/05/2016  . Essential hypertension, benign  12/01/2014  . Asthma, chronic 12/01/2014  . Microcytic anemia 12/01/2014  . Gastroesophageal reflux disease 12/01/2014  . Preventative health care 12/01/2014    Past Surgical History:  Procedure Laterality Date  . TOENAIL EXCISION       OB History   None      Home Medications    Prior to Admission medications   Medication Sig Start Date End Date Taking? Authorizing Provider  albuterol (PROVENTIL HFA;VENTOLIN HFA) 108 (90 Base) MCG/ACT inhaler Inhale 1-2 puffs into the lungs every 6 (six) hours as needed for wheezing or shortness of breath. 09/08/17  Yes Anne Shutter, MD  amLODipine (NORVASC) 10 MG tablet Take 10 mg by mouth daily.   Yes [provider]  pantoprazole (PROTONIX) 40 MG tablet Take 1 tablet (40 mg total) by mouth daily. 09/10/17  Yes Ali Lowe, MD  spironolactone (ALDACTONE) 25 MG tablet Take 1 tablet (25 mg total) by mouth daily for 90 doses. Patient taking differently: Take 50 mg by mouth daily.  09/11/17 12/10/17 Yes Chundi, Vahini, MD  chlorthalidone (HYGROTON) 50 MG tablet Take 1 tablet (50 mg total) by mouth daily. Patient not taking: Reported on 10/03/2017 08/18/17 10/03/25  Lorenso Courier, MD  dicyclomine (BENTYL) 10 MG capsule Take 1 capsule (10 mg total) by mouth 4 (four) times daily for 14 days. Patient not taking: Reported on 10/03/2017 09/29/17 10/13/17  Lorenso Courier, MD  ferrous sulfate 325 (65 FE) MG tablet Take 1 tablet (325 mg total) by mouth daily. Patient not taking: Reported on 10/03/2017 04/24/17 04/24/18  Nelson Chimes,  Sumayya, MD  fluticasone (FLONASE) 50 MCG/ACT nasal spray Place 2 sprays into both nostrils daily. Patient not taking: Reported on 10/03/2017 08/11/17   Belinda Fisher, PA-C  ondansetron (ZOFRAN ODT) 4 MG disintegrating tablet Take 1 tablet (4 mg total) by mouth every 8 (eight) hours as needed for up to 5 days for nausea or vomiting. Patient not taking: Reported on 10/03/2017 09/29/17 10/04/17  Lorenso Courier, MD  promethazine (PHENERGAN)  12.5 MG tablet Take 1 tablet (12.5 mg total) by mouth every 6 (six) hours as needed for nausea, vomiting or refractory nausea / vomiting. Patient not taking: Reported on 10/03/2017 07/22/17   Toney Rakes, MD  sertraline (ZOLOFT) 50 MG tablet Take 1 tablet (50 mg total) by mouth daily. Patient not taking: Reported on 10/03/2017 04/18/17 10/03/25  Beola Cord, MD  sucralfate (CARAFATE) 1 GM/10ML suspension Take 10 mLs (1 g total) by mouth 4 (four) times daily. Patient not taking: Reported on 10/03/2017 07/22/17 07/22/18  Toney Rakes, MD    Family History Family History  Problem Relation Age of Onset  . Diabetes Mother   . Hypertension Mother     Social History Social History   Tobacco Use  . Smoking status: Former Smoker    Packs/day: 0.10    Types: Cigarettes    Last attempt to quit: 04/06/2015    Years since quitting: 2.4  . Smokeless tobacco: Never Used  Substance Use Topics  . Alcohol use: No    Alcohol/week: 0.0 standard drinks  . Drug use: No     Allergies   Penicillins; Shellfish allergy; Sulfa antibiotics; Keflex [cephalexin]; Lisinopril; and Iodine   Review of Systems Review of Systems  Constitutional: Negative for chills, fatigue and fever.  Respiratory: Negative.   Cardiovascular: Negative.   Gastrointestinal: Positive for abdominal pain and nausea. Negative for constipation, diarrhea and vomiting.  Genitourinary: Negative for dysuria, frequency, hematuria, vaginal bleeding, vaginal discharge and vaginal pain.  Musculoskeletal: Negative.   Skin: Negative.   Neurological: Negative.    Physical Exam Updated Vital Signs BP (!) 145/82   Pulse 64   Temp 98.3 F (36.8 C) (Oral)   Resp 17   Ht 5\' 7"  (1.702 m)   Wt (!) 137.4 kg   LMP 09/05/2017   SpO2 94%   BMI 47.46 kg/m   Physical Exam  Constitutional: Vital signs are normal.  Non-toxic appearance. Victoria Booth does not appear ill.  Obese. Laying comfortably in bed.  Cardiovascular: Normal rate,  regular rhythm and normal heart sounds.  Pulmonary/Chest: Effort normal and breath sounds normal.  Abdominal: Soft. Normal appearance and bowel sounds are normal. There is no tenderness.  Skin: Skin is warm. Capillary refill takes less than 2 seconds.  Nursing note and vitals reviewed.   ED Treatments / Results  Labs (all labs ordered are listed, but only abnormal results are displayed) Labs Reviewed  COMPREHENSIVE METABOLIC PANEL - Abnormal; Notable for the following components:      Result Value   Glucose, Bld 102 (*)    BUN 5 (*)    All other components within normal limits  CBC - Abnormal; Notable for the following components:   RBC 5.77 (*)    Hemoglobin 10.6 (*)    MCV 66.0 (*)    MCH 18.4 (*)    MCHC 27.8 (*)    RDW 19.7 (*)    All other components within normal limits  LIPASE, BLOOD  URINALYSIS, ROUTINE W REFLEX MICROSCOPIC  I-STAT BETA HCG BLOOD, ED (  [facility], [facility], [facility] ONLY)    EKG None  Radiology No results found.  Procedures Procedures (including critical care time)  Medications Ordered in ED Medications  acetaminophen (TYLENOL) tablet 650 mg (650 mg Oral Given 10/03/17 2057)  sodium chloride 0.9 % bolus 1,000 mL (0 mLs Intravenous Stopped 10/03/17 2321)  ondansetron (ZOFRAN) injection 4 mg (4 mg Intravenous Given 10/03/17 2137)  famotidine (PEPCID) IVPB 20 mg premix (0 mg Intravenous Stopped 10/03/17 2210)  cefTRIAXone (ROCEPHIN) 2 g in sodium chloride 0.9 % 100 mL IVPB (0 g Intravenous Stopped 10/03/17 2321)  diphenhydrAMINE (BENADRYL) injection 25 mg (25 mg Intravenous Given 10/03/17 2137)     Initial Impression / Assessment and Plan / ED Course  Triage vital signs and the nursing notes have been reviewed.  Pertinent labs & imaging results that were available during care of the patient were reviewed and considered in medical decision making (see chart for details).  Patient presents afebrile and well appearing with complaints of chronic abdominal pain. Victoria Booth  complains of generalized abdominal pain, but there was no abdominal tenderness on exam or other abnormal abdominal findings on exam. Victoria Booth also reports that Victoria Booth PCP sent Victoria Booth to the ED for antibiotic changes for Victoria Booth UTI due to allergic reaction to Keflex. At this time, there is no indication for abdominal imaging. Medication will be provided for acute abdominal pain relief.  Clinical Course as of Oct 04 224  Caleen Essex Oct 03, 2017  2047 Labs unremarkable.   [GM]  2100 IV fluids, Zofran and Pepcid ordered for abdominal complaints. Physical exam unremarkable and no indication for imaging today. Call placed to pharmacist regarding antibiotic for patient's UTI. Has PCN allergy and endorsing reaction to Keflex. Pharmacist states that IV Rocephin 2g x1 can be given for UTI.   [GM]    Clinical Course User Index [GM] Oasis Goehring, Sharyon Medicus, PA-C    Final Clinical Impressions(s) / ED Diagnoses  1. Abdominal Pain. Chronic. Advised to follow-up with GI for complaints. IV fluids, Zofran and Pepcid administered for acute relief. 2. UTI. IV Rocephin 2g x1 given for relief. Patient had allergic reaction to Keflex and stopped taking medication 1 day after prescribed.  Dispo: Home. After thorough clinical evaluation, this patient is determined to be medically stable and can be safely discharged with the previously mentioned treatment and/or outpatient follow-up/referral(s). At this time, there are no other apparent medical conditions that require further screening, evaluation or treatment.   Final diagnoses:  Abdominal pain, unspecified abdominal location  Urinary tract infection without hematuria, site unspecified    ED Discharge Orders    None        Reva Bores 10/04/17 2202    Vanetta Mulders, MD 10/12/17 1710

## 2017-10-03 NOTE — Progress Notes (Signed)
I think she probably is having an allergic reaction to keflex as well. I would ask her to stop the keflex and follow up in the ED if her symptoms are worsening.

## 2017-10-03 NOTE — Telephone Encounter (Signed)
Pt would like a callback she have another question, pt contact# [phone]

## 2017-10-03 NOTE — Telephone Encounter (Signed)
Patient currently in ED. Kinnie Feil, RN, BSN

## 2017-10-03 NOTE — ED Triage Notes (Signed)
Pt reports she feels "off" like an "out of body experience" for the last 3 weeks. Pt reports epigastric and LUQ abd pain for the last 3 months with N/V/D and heartburn. Pt was taking an antibiotic for bacteria in her stomach but stopped d/t an allergic reaction. Pt reports losing 17lbs in 3 weeks cause she feels really bad, chills and weakness.

## 2017-10-03 NOTE — Progress Notes (Signed)
Patient called today stating that she has been noticing itching and erythema in her bilateral hands.  The patient has a documented allergy to penicillin but there is low cross-reactivity between penicillin and cephalosporins therefore opted to treat the patient for UTI with Keflex.  She later called back stating that she is continuing to worsen.  Informed the patient to go to the ED as she is likely volume depleted and has been having nausea and vomiting along with significant weight loss.  Lorenso Courier, MD Internal Medicine PGY2 Pager:[phone] 10/03/2017, 3:52 PM

## 2017-10-04 NOTE — Discharge Instructions (Addendum)
You have received all the antibiotics you need today to treat your UTI. You do not need to take any more after today. The rest of your lab work today is consistent with past lab values which is good.  I have placed information for a [facility] who you can follow-up with for your chronic abdominal pain.

## 2017-10-06 ENCOUNTER — Encounter: Payer: Self-pay | Admitting: Nurse Practitioner

## 2017-10-09 ENCOUNTER — Encounter: Payer: Self-pay | Admitting: Internal Medicine

## 2017-10-09 ENCOUNTER — Other Ambulatory Visit: Payer: Self-pay

## 2017-10-09 ENCOUNTER — Ambulatory Visit (INDEPENDENT_AMBULATORY_CARE_PROVIDER_SITE_OTHER): Payer: BLUE CROSS/BLUE SHIELD | Admitting: Internal Medicine

## 2017-10-09 DIAGNOSIS — Z6841 Body Mass Index (BMI) 40.0 and over, adult: Secondary | ICD-10-CM

## 2017-10-09 DIAGNOSIS — R1013 Epigastric pain: Secondary | ICD-10-CM | POA: Diagnosis not present

## 2017-10-09 DIAGNOSIS — K5900 Constipation, unspecified: Secondary | ICD-10-CM

## 2017-10-09 DIAGNOSIS — R634 Abnormal weight loss: Secondary | ICD-10-CM

## 2017-10-09 DIAGNOSIS — N760 Acute vaginitis: Secondary | ICD-10-CM | POA: Diagnosis not present

## 2017-10-09 DIAGNOSIS — R1032 Left lower quadrant pain: Secondary | ICD-10-CM | POA: Diagnosis not present

## 2017-10-09 DIAGNOSIS — B9689 Other specified bacterial agents as the cause of diseases classified elsewhere: Secondary | ICD-10-CM

## 2017-10-09 MED ORDER — METRONIDAZOLE 500 MG PO TABS: 500.0000 mg | ORAL_TABLET | Freq: Two times a day (BID) | ORAL | 0 refills | Status: DC

## 2017-10-09 NOTE — Patient Instructions (Addendum)
It was a pleasure to see you today [name]. Please make the following changes:  -Please follow up with gastroenterology  -Please start using metronidazole for your yeast infection  If you have any questions or concerns, please call our clinic at [phone] between 9am-5pm and after hours call [phone] and ask for the internal medicine resident on call. If you feel you are having a medical emergency please call 911.   Thank you, we look forward to help you remain healthy!  Lorenso Courier, MD Internal Medicine PGY2

## 2017-10-09 NOTE — Progress Notes (Signed)
   CC: Follow up for abdominal pain  HPI:  [name] is a 37 y.o. with past medical history documented below presents for follow up for abdominal pain. Please see problem based charting for evaluation, assessment, and plan.   Past Medical History:  Diagnosis Date  . Anemia   . Asthma   . Hypertension   . Obesity    Review of Systems:    Review of Systems  Constitutional: Positive for malaise/fatigue and weight loss. Negative for chills and fever.  Cardiovascular: Negative for chest pain.  Gastrointestinal: Positive for abdominal pain and constipation. Negative for diarrhea, nausea and vomiting.  Genitourinary: Negative for urgency.       Vaginal discharge  Psychiatric/Behavioral: Negative for depression.   Physical Exam:  Vitals:   10/09/17 0905  BP: (!) 142/87  Pulse: 67  Temp: 97.9 F (36.6 C)  TempSrc: Oral  SpO2: 98%  Weight: (!) 305 lb 6.4 oz (138.5 kg)  Height: 5\' 7"  (1.702 m)   Physical Exam  Constitutional: She appears well-developed and well-nourished. No distress.  HENT:  Head: Normocephalic and atraumatic.  Eyes: Conjunctivae are normal.  Cardiovascular: Normal rate, regular rhythm and normal heart sounds.  Respiratory: Effort normal and breath sounds normal. No respiratory distress. She has no wheezes.  GI: Soft. Bowel sounds are normal. She exhibits no distension and no mass. There is tenderness (epigastric region and left lower quadrant). There is no guarding.  Musculoskeletal: She exhibits no edema.  Neurological: She is alert.  Skin: She is not diaphoretic.  Psychiatric: She has a normal mood and affect. Her behavior is normal. Judgment and thought content normal.    Assessment & Plan:   See Encounters Tab for problem based charting.  Patient discussed with Dr. Sandre Kitty

## 2017-10-10 DIAGNOSIS — N760 Acute vaginitis: Secondary | ICD-10-CM

## 2017-10-10 DIAGNOSIS — B9689 Other specified bacterial agents as the cause of diseases classified elsewhere: Secondary | ICD-10-CM | POA: Insufficient documentation

## 2017-10-10 NOTE — Assessment & Plan Note (Signed)
Presenting for ED follow-up (10/03/17) for generalized abdominal pain for the past 3 months, nausea, vomiting, diarrhea.  Patient has been having alternating episodes of diarrhea and constipation along with epigastric and left lower quadrant abdominal pain for the past 3 months.    The patient states that after she eats she cannot digest the food and she has a feeling that something is stuck in her stomach.  She has not vomited in the past 6 to 7 days and she has not had any episodes of diarrhea.  She is currently constipated and has been using MiraLAX for relief.  Assessment and plan Patient has alarm symptoms of nocturnal awakening secondary to GI symptoms, history of some blood noted on toilet paper, and a recent weight loss of 19 pounds during this month which cause concern for an organic cause of her abdominal pain.  She is scheduled for an appointment with gastroenterology on October 23, 2017.  I recommend the patient get both an EGD and colonoscopy to evaluate for inflammatory bowel syndrome, strictures, small bowel bacterial overgrowth.  Other organic causes are ruled out she may have pain predominant irritable bowel syndrome.  The meantime the patient will be symptomatically managed with Carafate, pantoprazole, Bentyl.  Recommended the patient increase her dose of pantoprazole to 40 mg twice daily.

## 2017-10-10 NOTE — Assessment & Plan Note (Addendum)
The patient is been having a 1 week history of vaginal irritation, itchiness, discharge.  The patient describes the discharge is white in color and can consistency.  She has recently been treated with antibiotics.  The patient is sexually active with only females.  She states that she has not had sexual intercourse in the past 7 months.  She only participates in oral intercourse.  Assessment and plan The patient likely has bacterial vaginosis that will be treated with metronidazole 500 mg twice daily for 7 days.  She was told to avoid alcohol during this time.  Due to the patient's history of not having intercourse in the past 7 months will not evaluate for other STDs at this time.

## 2017-10-12 ENCOUNTER — Other Ambulatory Visit: Payer: Self-pay

## 2017-10-12 ENCOUNTER — Emergency Department (HOSPITAL_COMMUNITY)
Admission: EM | Admit: 2017-10-12 | Discharge: 2017-10-12 | Disposition: A | Discharge status: Home / Self Care | Payer: BLUE CROSS/BLUE SHIELD | Attending: Emergency Medicine | Admitting: Emergency Medicine

## 2017-10-12 ENCOUNTER — Encounter (HOSPITAL_COMMUNITY): Payer: Self-pay | Admitting: Emergency Medicine

## 2017-10-12 ENCOUNTER — Emergency Department (HOSPITAL_COMMUNITY): Payer: BLUE CROSS/BLUE SHIELD

## 2017-10-12 DIAGNOSIS — I10 Essential (primary) hypertension: Secondary | ICD-10-CM | POA: Insufficient documentation

## 2017-10-12 DIAGNOSIS — Z79899 Other long term (current) drug therapy: Secondary | ICD-10-CM | POA: Diagnosis not present

## 2017-10-12 DIAGNOSIS — Z87891 Personal history of nicotine dependence: Secondary | ICD-10-CM | POA: Insufficient documentation

## 2017-10-12 DIAGNOSIS — R079 Chest pain, unspecified: Secondary | ICD-10-CM | POA: Insufficient documentation

## 2017-10-12 DIAGNOSIS — R002 Palpitations: Secondary | ICD-10-CM

## 2017-10-12 LAB — I-STAT BETA HCG BLOOD, ED (MC, WL, AP ONLY)

## 2017-10-12 LAB — CBC WITH DIFFERENTIAL/PLATELET
BASOS ABS: 0 10*3/uL (ref 0.0–0.1)
BASOS PCT: 1 %
EOS ABS: 0.2 10*3/uL (ref 0.0–0.7)
EOS PCT: 3 %
HEMATOCRIT: 31.6 % — AB (ref 36.0–46.0)
Hemoglobin: 9.3 g/dL — ABNORMAL LOW (ref 12.0–15.0)
Lymphocytes Relative: 43 %
Lymphs Abs: 3 10*3/uL (ref 0.7–4.0)
MCH: 18.9 pg — ABNORMAL LOW (ref 26.0–34.0)
MCHC: 29.4 g/dL — AB (ref 30.0–36.0)
MCV: 64.1 fL — ABNORMAL LOW (ref 78.0–100.0)
Monocytes Absolute: 0.4 10*3/uL (ref 0.1–1.0)
Monocytes Relative: 6 %
NEUTROS ABS: 3.4 10*3/uL (ref 1.7–7.7)
Neutrophils Relative %: 47 %
PLATELETS: 377 10*3/uL (ref 150–400)
RBC: 4.93 MIL/uL (ref 3.87–5.11)
RDW: 18.8 % — AB (ref 11.5–15.5)
WBC: 7 10*3/uL (ref 4.0–10.5)

## 2017-10-12 LAB — COMPREHENSIVE METABOLIC PANEL
ALBUMIN: 3.4 g/dL — AB (ref 3.5–5.0)
ALK PHOS: 80 U/L (ref 38–126)
ALT: 16 U/L (ref 0–44)
ANION GAP: 10 (ref 5–15)
AST: 23 U/L (ref 15–41)
BUN: 9 mg/dL (ref 6–20)
CALCIUM: 8.7 mg/dL — AB (ref 8.9–10.3)
CHLORIDE: 106 mmol/L (ref 98–111)
CO2: 23 mmol/L (ref 22–32)
Creatinine, Ser: 0.73 mg/dL (ref 0.44–1.00)
GFR calc non Af Amer: >60 mL/min (ref >60)
GLUCOSE: 105 mg/dL — AB (ref 70–99)
POTASSIUM: 4 mmol/L (ref 3.5–5.1)
SODIUM: 139 mmol/L (ref 135–145)
Total Bilirubin: 0.4 mg/dL (ref 0.3–1.2)
Total Protein: 7 g/dL (ref 6.5–8.1)

## 2017-10-12 LAB — TSH: TSH: 1.208 u[IU]/mL (ref 0.350–4.500)

## 2017-10-12 LAB — TROPONIN I

## 2017-10-12 NOTE — ED Triage Notes (Signed)
Pt c/o heart palpitations since last night. Pt denies pain or other symptoms.

## 2017-10-12 NOTE — ED Provider Notes (Signed)
[facility]-EMERGENCY DEPT Provider Note   CSN: [id number] Arrival date & time: 10/12/17  2841     History   Chief Complaint Chief Complaint  Patient presents with  . Palpitations    HPI Victoria Booth is a 37 y.o. female.  37 year old female presents with palpitations and chest pressure that began last night.  Symptoms were at rest and nothing was taken for them.  No prior history of same.  Patient went to sleep with bed and woke up and continued to have this feeling of her heart having a funny beat.  Her chest pressure was nonradiating and not associated with nausea, diaphoresis, dyspnea.  She states that she did not feel like her heart was beating fast or slow.  She had no associated weakness or syncope or near syncope.  No recent cough or congestion.  No exertional component.  No leg pain or swelling.  No pleuritic component.  Nothing seemed to make it better or worse.     Past Medical History:  Diagnosis Date  . Anemia   . Asthma   . Hypertension   . Obesity     Patient Active Problem List   Diagnosis Date Noted  . Vaginal candidiasis 10/10/2017  . Iron deficiency anemia 08/18/2017  . Acute pain of left shoulder 08/18/2017  . Insomnia 07/29/2017  . Left lower quadrant abdominal pain of unknown etiology 07/13/2017  . OSA (obstructive sleep apnea) 06/05/2017  . Fatigue 04/25/2017  . Major depressive disorder 03/21/2017  . Migraine 10/24/2016  . Hematuria 09/24/2016  . Prediabetes 08/21/2016  . Lumbar back pain 08/21/2016  . Allergic rhinitis 06/18/2016  . Left flank pain 06/05/2016  . Obesity, morbid, BMI 50 or higher (HCC) 06/05/2016  . PCOS (polycystic ovarian syndrome) 04/05/2016  . Essential hypertension, benign 12/01/2014  . Asthma, chronic 12/01/2014  . Microcytic anemia 12/01/2014  . Gastroesophageal reflux disease 12/01/2014  . Preventative health care 12/01/2014    Past Surgical History:  Procedure Laterality Date  . TOENAIL  EXCISION       OB History   None      Home Medications    Prior to Admission medications   Medication Sig Start Date End Date Taking? Authorizing Provider  albuterol (PROVENTIL HFA;VENTOLIN HFA) 108 (90 Base) MCG/ACT inhaler Inhale 1-2 puffs into the lungs every 6 (six) hours as needed for wheezing or shortness of breath. 09/08/17  Yes Anne Shutter, MD  amLODipine (NORVASC) 10 MG tablet Take 10 mg by mouth daily.   Yes [provider]  pantoprazole (PROTONIX) 40 MG tablet Take 1 tablet (40 mg total) by mouth daily. 09/10/17  Yes Ali Lowe, MD  spironolactone (ALDACTONE) 25 MG tablet Take 1 tablet (25 mg total) by mouth daily for 90 doses. Patient taking differently: Take 50 mg by mouth daily.  09/11/17 12/10/17 Yes Chundi, Vahini, MD  chlorthalidone (HYGROTON) 50 MG tablet Take 1 tablet (50 mg total) by mouth daily. Patient not taking: Reported on 10/03/2017 08/18/17 10/03/25  Lorenso Courier, MD  dicyclomine (BENTYL) 10 MG capsule Take 1 capsule (10 mg total) by mouth 4 (four) times daily for 14 days. Patient not taking: Reported on 10/03/2017 09/29/17 10/13/17  Lorenso Courier, MD  ferrous sulfate 325 (65 FE) MG tablet Take 1 tablet (325 mg total) by mouth daily. Patient not taking: Reported on 10/03/2017 04/24/17 04/24/18  Arnetha Courser, MD  fluticasone [facility]) 50 MCG/ACT nasal spray Place 2 sprays into both nostrils daily. Patient not taking: Reported  on 10/03/2017 08/11/17   Belinda Fisher, PA-C  promethazine (PHENERGAN) 12.5 MG tablet Take 1 tablet (12.5 mg total) by mouth every 6 (six) hours as needed for nausea, vomiting or refractory nausea / vomiting. Patient not taking: Reported on 10/03/2017 07/22/17   Toney Rakes, MD  sertraline (ZOLOFT) 50 MG tablet Take 1 tablet (50 mg total) by mouth daily. Patient not taking: Reported on 10/03/2017 04/18/17 10/03/25  Beola Cord, MD  sucralfate (CARAFATE) 1 GM/10ML suspension Take 10 mLs (1 g total) by mouth 4 (four) times  daily. Patient not taking: Reported on 10/03/2017 07/22/17 07/22/18  Toney Rakes, MD    Family History Family History  Problem Relation Age of Onset  . Diabetes Mother   . Hypertension Mother     Social History Social History   Tobacco Use  . Smoking status: Former Smoker    Packs/day: 0.10    Types: Cigarettes    Last attempt to quit: 04/06/2015    Years since quitting: 2.5  . Smokeless tobacco: Never Used  Substance Use Topics  . Alcohol use: No    Alcohol/week: 0.0 standard drinks  . Drug use: No     Allergies   Penicillins; Shellfish allergy; Sulfa antibiotics; Keflex [cephalexin]; Lisinopril; and Iodine   Review of Systems Review of Systems  All other systems reviewed and are negative.    Physical Exam Updated Vital Signs BP (!) 185/99   Pulse 72   Temp 97.6 F (36.4 C)   Resp 16   LMP 09/09/2017 (Exact Date)   SpO2 99%   Physical Exam  Constitutional: She is oriented to person, place, and time. She appears well-developed and well-nourished.  Non-toxic appearance. No distress.  HENT:  Head: Normocephalic and atraumatic.  Eyes: Pupils are equal, round, and reactive to light. Conjunctivae, EOM and lids are normal.  Neck: Normal range of motion. Neck supple. No tracheal deviation present. No thyroid mass present.  Cardiovascular: Normal rate, regular rhythm and normal heart sounds. Exam reveals no gallop.  No murmur heard. Pulmonary/Chest: Effort normal and breath sounds normal. No stridor. No respiratory distress. She has no decreased breath sounds. She has no wheezes. She has no rhonchi. She has no rales.  Abdominal: Soft. Normal appearance and bowel sounds are normal. She exhibits no distension. There is no tenderness. There is no rebound and no CVA tenderness.  Musculoskeletal: Normal range of motion. She exhibits no edema or tenderness.  Neurological: She is alert and oriented to person, place, and time. She has normal strength. No cranial nerve  deficit or sensory deficit. GCS eye subscore is 4. GCS verbal subscore is 5. GCS motor subscore is 6.  Skin: Skin is warm and dry. No abrasion and no rash noted.  Psychiatric: She has a normal mood and affect. Her speech is normal and behavior is normal.  Nursing note and vitals reviewed.    ED Treatments / Results  Labs (all labs ordered are listed, but only abnormal results are displayed) Labs Reviewed  CBC WITH DIFFERENTIAL/PLATELET  TROPONIN I  COMPREHENSIVE METABOLIC PANEL  TSH    EKG EKG Interpretation  Date/Time:  Sunday October 12 2017 10:22:40 EDT Ventricular Rate:  70 PR Interval:    QRS Duration: 81 QT Interval:  374 QTC Calculation: 404 R Axis:     Text Interpretation:  Sinus rhythm Inferior infarct, age indeterminate Lateral leads are also involved No significant change since last tracing Confirmed by Lorre Nick ([id number]) on 10/12/2017 12:53:46 PM  Radiology No results found.  Procedures Procedures (including critical care time)  Medications Ordered in ED Medications - No data to display   Initial Impression / Assessment and Plan / ED Course  I have reviewed the triage vital signs and the nursing notes.  Pertinent labs & imaging results that were available during my care of the patient were reviewed by me and considered in my medical decision making (see chart for details).     Patient's work-up here did not show any acute abnormalities.  Electrolytes CBC troponin all normal.  Low suspicion for ACS.  EKG without acute findings.  Patient stable for discharge and return precautions given  Final Clinical Impressions(s) / ED Diagnoses   Final diagnoses:  Chest pain    ED Discharge Orders    None       Lorre Nick, MD 10/12/17 1328

## 2017-10-16 ENCOUNTER — Encounter: Payer: Self-pay | Admitting: Gastroenterology

## 2017-10-16 ENCOUNTER — Encounter (INDEPENDENT_AMBULATORY_CARE_PROVIDER_SITE_OTHER): Payer: Self-pay

## 2017-10-16 ENCOUNTER — Encounter

## 2017-10-16 ENCOUNTER — Ambulatory Visit (INDEPENDENT_AMBULATORY_CARE_PROVIDER_SITE_OTHER): Payer: BLUE CROSS/BLUE SHIELD | Admitting: Gastroenterology

## 2017-10-16 VITALS — BP 128/88 | HR 73 | Ht 67.0 in | Wt 304.0 lb

## 2017-10-16 DIAGNOSIS — R1012 Left upper quadrant pain: Secondary | ICD-10-CM

## 2017-10-16 DIAGNOSIS — K219 Gastro-esophageal reflux disease without esophagitis: Secondary | ICD-10-CM | POA: Diagnosis not present

## 2017-10-16 DIAGNOSIS — R194 Change in bowel habit: Secondary | ICD-10-CM | POA: Diagnosis not present

## 2017-10-16 DIAGNOSIS — R112 Nausea with vomiting, unspecified: Secondary | ICD-10-CM | POA: Diagnosis not present

## 2017-10-16 HISTORY — DX: Left upper quadrant pain: R10.12

## 2017-10-16 MED ORDER — ONDANSETRON 4 MG PO TBDP: 4.0000 mg | ORAL_TABLET | Freq: Three times a day (TID) | ORAL | 0 refills | Status: DC | PRN

## 2017-10-16 NOTE — Progress Notes (Signed)
10/16/2017 Victoria Booth [id number] 04/15/1980   HISTORY OF PRESENT ILLNESS:  This is a 37 year old female who is new to our office.  She is here today with multiple GI complaints including nausea ad vomiting, poor appetite, weight loss of 20 pounds in 3 weeks, change in bowel habits with alternating constipation and diarrhea, GERD, and epigastric abdominal pain.  She had an abdominal x-ray back in March that showed moderate colonic stool volume.  CT scan in July showed the following:  1.There is a 7 mm left renal pelvis calculus with adjacent inflammatory changes which could contribute to left-sided abdominal pain. No hydronephrosis. 2.There is a 2.7 cm cyst in the cervix or adjacent right adnexa, possibly representing a large nabothian cyst or fibroid. If the patient is having symptoms referrable to the pelvis, further evaluation could be performed with pelvic ultrasound. 3.Edema and stranding in the left greater than right abdominal wall. Recommend correlation with physical exam for evidence of cellulitis.  She says that she has seen urology about stones in the past but they said they should not be causing any symptoms.  Anyway, she complains of all of the symptoms beginning about 3 or so months ago.  She says this started out of nowhere.  Never had any GI issues in the past.  She says that she has not been able to tolerate much by mouth and has even vomited up water at times.  She says that at times she will have loose stools, but then other times she is constipated.  She has now not moved her bowels in 4 days.  She has been taking MiraLAX twice daily.  She complains of left-sided abdominal pain.  She says that she is taking pantoprazole 40 mg twice daily and Zantac twice daily along with Maalox and has had no relief of her reflux symptoms.  Feels burning in her throat.  Says she has lost 22 pounds in 3 weeks.  She has seen blood in her stools in the past, just bright red blood upon  wiping.  Has not seen anything like that recently.  No dysphasia or issues with food getting stuck as it is going down, but says that it feels like as soon as it hits her stomach it wants to come right back up.  She denies NSAID use.   Past Medical History:  Diagnosis Date  . Anemia   . Asthma   . Hypertension   . Obesity    Past Surgical History:  Procedure Laterality Date  . TOENAIL EXCISION      reports that she quit smoking about 2 years ago. Her smoking use included cigarettes. She smoked 0.10 packs per day. She has never used smokeless tobacco. She reports that she does not drink alcohol or use drugs. family history includes Diabetes in her mother; Heart attack in her mother; Hypertension in her mother. Allergies  Allergen Reactions  . Penicillins Shortness Of Breath, Rash and Other (See Comments)    Has patient had a PCN reaction causing immediate rash, facial/tongue/throat swelling, SOB or lightheadedness with hypotension:yes Has patient had a PCN reaction causing severe rash involving mucus membranes or skin necrosis: no Has patient had a PCN reaction that required hospitalization : no Has patient had a PCN reaction occurring within the last 10 years:no  If all of the above answers are "NO", then may proceed with Cephalosporin use.   . Shellfish Allergy Swelling  . Sulfa Antibiotics Hives  . Keflex [Cephalexin]  Itching and Other (See Comments)    Itching hands and legs  . Lisinopril Cough  . Iodine Rash      Outpatient Encounter Medications as of 10/16/2017  Medication Sig  . albuterol (PROVENTIL HFA;VENTOLIN HFA) 108 (90 Base) MCG/ACT inhaler Inhale 1-2 puffs into the lungs every 6 (six) hours as needed for wheezing or shortness of breath.  Marland Kitchen amLODipine (NORVASC) 10 MG tablet Take 10 mg by mouth daily.  . pantoprazole (PROTONIX) 40 MG tablet Take 1 tablet (40 mg total) by mouth daily. (Patient taking differently: Take 40 mg by mouth 2 (two) times daily. )  .  spironolactone (ALDACTONE) 25 MG tablet Take 1 tablet (25 mg total) by mouth daily for 90 doses. (Patient taking differently: Take 50 mg by mouth daily. )  . [DISCONTINUED] chlorthalidone (HYGROTON) 50 MG tablet Take 1 tablet (50 mg total) by mouth daily.  . [DISCONTINUED] ferrous sulfate 325 (65 FE) MG tablet Take 1 tablet (325 mg total) by mouth daily.  . [DISCONTINUED] fluticasone (FLONASE) 50 MCG/ACT nasal spray Place 2 sprays into both nostrils daily.  . [DISCONTINUED] promethazine (PHENERGAN) 12.5 MG tablet Take 1 tablet (12.5 mg total) by mouth every 6 (six) hours as needed for nausea, vomiting or refractory nausea / vomiting.  . [DISCONTINUED] sertraline (ZOLOFT) 50 MG tablet Take 1 tablet (50 mg total) by mouth daily.  . [DISCONTINUED] sucralfate (CARAFATE) 1 GM/10ML suspension Take 10 mLs (1 g total) by mouth 4 (four) times daily.  . ondansetron (ZOFRAN-ODT) 4 MG disintegrating tablet Take 1 tablet (4 mg total) by mouth every 8 (eight) hours as needed for nausea or vomiting.  . [DISCONTINUED] dicyclomine (BENTYL) 10 MG capsule Take 1 capsule (10 mg total) by mouth 4 (four) times daily for 14 days. (Patient not taking: Reported on 10/03/2017)   No facility-administered encounter medications on file as of 10/16/2017.      REVIEW OF SYSTEMS  : All other systems reviewed and negative except where noted in the History of Present Illness.   PHYSICAL EXAM: BP 128/88   Pulse 73   Ht 5\' 7"  (1.702 m)   Wt (!) 304 lb (137.9 kg)   BMI 47.61 kg/m  General: Well developed black female in no acute distress Head: Normocephalic and atraumatic Eyes:  Sclerae anicteric, conjunctiva pink. Ears: Normal auditory acuity Lungs: Clear throughout to auscultation; no increased WOB Heart: Regular rate and rhythm; no M/R/G. Abdomen: Soft, non-distended.  BS present.  Epigastric TTP and left sided TTP. Rectal:  Will be done at the time of colonoscopy. Musculoskeletal: Symmetrical with no gross deformities    Skin: No lesions on visible extremities Extremities: No edema  Neurological: Alert oriented x 4, grossly non-focal Psychological:  Alert and cooperative. Normal mood and affect  ASSESSMENT AND PLAN: 37 year old female with multiple GI complaints including nausea ad vomiting, poor appetite, weight loss of 20 pounds in 3 weeks, change in bowel habits with alternating constipation and diarrhea, epigastric abdominal pain with GERD, and left sided abdominal pain. *Left sided abdominal pain:  Seems more LUQ.  ? Related to constipation or possibly the renal stone issue with inflammatory changes seen on 08/2017 CT scan, but she says that urology previously told her the renal stones weren't an issue.  I am going to clean her out with some magnesium citrate tonight, take one bottle and if no BM in an hour then drink the second.  Then will have her resume her BID Miralax tomorrow.  I think that in  light of her pain, change in bowel habits, and weight loss that she needs colonoscopy to evaluate for IBD, etc.  If colon unremarkable then may need sent back to urology for reevaluation.   *GERD, nausea, vomiting:  Is on BID PPI, zantac BID, and maalox without relief.  Will plan for EGD as well.  **EGD and colonoscopy scheduled with Dr. Leone Payor.  The risks, benefits, and alternatives to EGD and colonoscopy were discussed with the patient and she consents to proceed.  CC:  Lorenso Courier, MD

## 2017-10-16 NOTE — Patient Instructions (Signed)
We have sent the following medications to your pharmacy for you to pick up at your convenience: Zofran 4 mg every 8 hours as needed for nausea  2 bottles of magnesium tonight, drink one if no BM in one hour then drink the other.   You have been scheduled for an endoscopy and colonoscopy. Please follow the written instructions given to you at your visit today. Please pick up your prep supplies at the pharmacy within the next 1-3 days. If you use inhalers (even only as needed), please bring them with you on the day of your procedure. Your physician has requested that you go to www.startemmi.com and enter the access code given to you at your visit today. This web site gives a general overview about your procedure. However, you should still follow specific instructions given to you by our office regarding your preparation for the procedure.

## 2017-10-17 ENCOUNTER — Inpatient Hospital Stay (HOSPITAL_COMMUNITY): Admission: RE | Admit: 2017-10-17 | Payer: BLUE CROSS/BLUE SHIELD | Source: Ambulatory Visit

## 2017-10-17 NOTE — Progress Notes (Signed)
Internal Medicine Clinic Attending  Case discussed with Dr. Chundi at the time of the visit.  We reviewed the resident's history and exam and pertinent patient test results.  I agree with the assessment, diagnosis, and plan of care documented in the resident's note.  Alexander Raines, M.D., Ph.D.  

## 2017-10-21 ENCOUNTER — Ambulatory Visit (AMBULATORY_SURGERY_CENTER): Payer: BLUE CROSS/BLUE SHIELD | Admitting: Internal Medicine

## 2017-10-21 ENCOUNTER — Encounter: Payer: Self-pay | Admitting: Internal Medicine

## 2017-10-21 ENCOUNTER — Telehealth: Payer: Self-pay

## 2017-10-21 VITALS — BP 110/53 | HR 90 | Temp 97.3°F | Resp 18 | Ht 67.0 in | Wt 304.0 lb

## 2017-10-21 DIAGNOSIS — R194 Change in bowel habit: Secondary | ICD-10-CM

## 2017-10-21 DIAGNOSIS — K319 Disease of stomach and duodenum, unspecified: Secondary | ICD-10-CM | POA: Diagnosis not present

## 2017-10-21 DIAGNOSIS — K633 Ulcer of intestine: Secondary | ICD-10-CM | POA: Diagnosis not present

## 2017-10-21 DIAGNOSIS — R112 Nausea with vomiting, unspecified: Secondary | ICD-10-CM

## 2017-10-21 MED ORDER — SODIUM CHLORIDE 0.9 % IV SOLN: 500.0000 mL | Freq: Once | INTRAVENOUS | Status: DC

## 2017-10-21 MED ORDER — ONDANSETRON 8 MG PO TBDP: 8.0000 mg | ORAL_TABLET | Freq: Three times a day (TID) | ORAL | 1 refills | Status: DC

## 2017-10-21 NOTE — Progress Notes (Signed)
Called to room to assist during endoscopic procedure.  Patient ID and intended procedure confirmed with present staff. Received instructions for my participation in the procedure from the performing physician.  

## 2017-10-21 NOTE — Op Note (Signed)
[facility] Patient Name: Victoria Booth Procedure Date: 10/21/2017 9:20 AM MRN: [id number] Endoscopist: Iva Boop , MD Age: 37 Referring MD:  Date of Birth: December 28, 1980 Gender: Female Account #: [id number] Procedure:                Upper GI endoscopy Indications:              Nausea with vomiting, Persistent vomiting Medicines:                Propofol per Anesthesia, Monitored Anesthesia Care Procedure:                After obtaining informed consent, the endoscope was                            passed under direct vision. Throughout the                            procedure, the patient's blood pressure, pulse, and                            oxygen saturations were monitored continuously. The                            Model GIF-[id number]) scope was introduced                            through the mouth, and advanced to the second part                            of duodenum. The upper GI endoscopy was                            accomplished without difficulty. The patient                            tolerated the procedure well. Scope In: Scope Out: Findings:                 The esophagus was normal.                           The stomach was normal.                           The examined duodenum was normal. Complications:            No immediate complications. Estimated Blood Loss:     Estimated blood loss: none. Impression:               - Normal esophagus.                           - Normal stomach.                           - Normal examined duodenum.                           - No  specimens collected. Recommendation:           - See the other procedure note for documentation of                            additional recommendations. Colonoscopy next. Iva Boop, MD 10/21/2017 10:00:23 AM This report has been signed electronically.

## 2017-10-21 NOTE — Op Note (Signed)
[facility] Patient Name: Victoria Booth Procedure Date: 10/21/2017 9:20 AM MRN: [id number] Endoscopist: Victoria Booth , MD Age: 37 Referring MD:  Date of Birth: Sep 13, 1980 Gender: Female Account #: [id number] Procedure:                Colonoscopy Indications:              Change in bowel habits Medicines:                Propofol per Anesthesia, Monitored Anesthesia Care Procedure:                Pre-Anesthesia Assessment:                           - Prior to the procedure, a History and Physical                            was performed, and patient medications and                            allergies were reviewed. The patient's tolerance of                            previous anesthesia was also reviewed. The risks                            and benefits of the procedure and the sedation                            options and risks were discussed with the patient.                            All questions were answered, and informed consent                            was obtained. Prior Anticoagulants: The patient has                            taken no previous anticoagulant or antiplatelet                            agents. ASA Grade Assessment: III - A patient with                            severe systemic disease. After reviewing the risks                            and benefits, the patient was deemed in                            satisfactory condition to undergo the procedure.                           After obtaining informed consent, the colonoscope  was passed under direct vision. Throughout the                            procedure, the patient's blood pressure, pulse, and                            oxygen saturations were monitored continuously. The                            Model CF-HQ190L [id number] scope was introduced                            through the anus and advanced to the the terminal                            ileum,  with identification of the appendiceal                            orifice and IC valve. The colonoscopy was performed                            without difficulty. The patient tolerated the                            procedure well. The quality of the bowel                            preparation was excellent. The terminal ileum,                            ileocecal valve, appendiceal orifice, and rectum                            were photographed. Scope In: 9:39:48 AM Scope Out: 9:47:18 AM Scope Withdrawal Time: 0 hours 6 minutes 7 seconds  Total Procedure Duration: 0 hours 7 minutes 30 seconds  Findings:                 The perianal and digital rectal examinations were                            normal.                           A localized area of mucosa in the terminal ileum                            was erythematous, granular, inflamed and ulcerated.                            Biopsies were taken with a cold forceps for                            histology. Verification of patient identification  for the specimen was done. Estimated blood loss was                            minimal.                           The exam was otherwise without abnormality on                            direct and retroflexion views. Complications:            No immediate complications. Estimated Blood Loss:     Estimated blood loss was minimal. Impression:               - Erythematous, granular, inflamed and ulcerated                            mucosa in the terminal ileum. Biopsied.                           - The examination was otherwise normal on direct                            and retroflexion views. Recommendation:           - Patient has a contact number available for                            emergencies. The signs and symptoms of potential                            delayed complications were discussed with the                            patient. Return to normal  activities tomorrow.                            Written discharge instructions were provided to the                            patient.                           - Resume previous diet.                           - Continue present medications.                           - Await pathology results.                           - Ondansetron 8 mg tid-qid                           await bxs ? IBD, ? incidental finding  I think nephrolithiasis could be part of overall                            symptom complex despite what she was told at [facility]                           She had hematuria and Left renal pelvis stone -                            needs urology evaluation we will refer Victoria Boop, MD 10/21/2017 10:06:06 AM This report has been signed electronically.

## 2017-10-21 NOTE — Telephone Encounter (Signed)
I spoke with [name] at [facility].  She will review the records and contact the patient for an appt. I left a detailed message that [facility] will be contacting her directly to schedule an appointment.

## 2017-10-21 NOTE — Patient Instructions (Addendum)
There is inflammation in the ileum - end of the small intestine. I took biopsies.  All else looks ok.  I am prescribing a new higher dose of ondansetron and want you to take 3-4 times a day to see if that helsp until I get biopsy results back. Will call with results and plans.  I appreciate the opportunity to care for you. Iva Boop, MD, FACG YOU HAD AN ENDOSCOPIC PROCEDURE TODAY AT THE  ENDOSCOPY CENTER:   Refer to the procedure report that was given to you for any specific questions about what was found during the examination.  If the procedure report does not answer your questions, please call your gastroenterologist to clarify.  If you requested that your care partner not be given the details of your procedure findings, then the procedure report has been included in a sealed envelope for you to review at your convenience later.  YOU SHOULD EXPECT: Some feelings of bloating in the abdomen. Passage of more gas than usual.  Walking can help get rid of the air that was put into your GI tract during the procedure and reduce the bloating. If you had a lower endoscopy (such as a colonoscopy or flexible sigmoidoscopy) you may notice spotting of blood in your stool or on the toilet paper. If you underwent a bowel prep for your procedure, you may not have a normal bowel movement for a few days.  Please Note:  You might notice some irritation and congestion in your nose or some drainage.  This is from the oxygen used during your procedure.  There is no need for concern and it should clear up in a day or so.  SYMPTOMS TO REPORT IMMEDIATELY:   Following lower endoscopy (colonoscopy or flexible sigmoidoscopy):  Excessive amounts of blood in the stool  Significant tenderness or worsening of abdominal pains  Swelling of the abdomen that is new, acute  Fever of 100F or higher   Following upper endoscopy (EGD)  Vomiting of blood or coffee ground material  New chest pain or pain  under the shoulder blades  Painful or persistently difficult swallowing  New shortness of breath  Fever of 100F or higher  Black, tarry-looking stools  For urgent or emergent issues, a gastroenterologist can be reached at any hour by calling [phone].   DIET:  We do recommend a small meal at first, but then you may proceed to your regular diet.  Drink plenty of fluids but you should avoid alcoholic beverages for 24 hours.  ACTIVITY:  You should plan to take it easy for the rest of today and you should NOT DRIVE or use heavy machinery until tomorrow (because of the sedation medicines used during the test).    FOLLOW UP: Our staff will call the number listed on your records the next business day following your procedure to check on you and address any questions or concerns that you may have regarding the information given to you following your procedure. If we do not reach you, we will leave a message.  However, if you are feeling well and you are not experiencing any problems, there is no need to return our call.  We will assume that you have returned to your regular daily activities without incident.  If any biopsies were taken you will be contacted by phone or by letter within the next 1-3 weeks.  Please call us at [phone] if you have not heard about the biopsies in 3 weeks.  SIGNATURES/CONFIDENTIALITY: You and/or your care partner have signed paperwork which will be entered into your electronic medical record.  These signatures attest to the fact that that the information above on your After Visit Summary has been reviewed and is understood.  Full responsibility of the confidentiality of this discharge information lies with you and/or your care-partner.

## 2017-10-21 NOTE — Telephone Encounter (Signed)
-----   Message from Iva Boop, MD sent at 10/21/2017 10:11 AM EDT ----- Regarding: GU referral Refer to [facility] re: nephrolithiasis  See by next week please  Make sure they know [facility] CT scan and UA from July show problems

## 2017-10-21 NOTE — Progress Notes (Signed)
Report given to PACU, vss 

## 2017-10-22 ENCOUNTER — Telehealth: Payer: Self-pay

## 2017-10-22 NOTE — Telephone Encounter (Signed)
  Follow up Call-  Call back number 10/21/2017  Post procedure Call Back phone  # [phone]  Permission to leave phone message Yes  Some recent data might be hidden     Patient questions:  Do you have a fever, pain , or abdominal swelling? No. Pain Score  0 *  Have you tolerated food without any problems? Yes.    Have you been able to return to your normal activities? Yes.    Do you have any questions about your discharge instructions: Diet   No. Medications  No. Follow up visit  No.  Do you have questions or concerns about your Care? No.  Actions: * If pain score is 4 or above: No action needed, pain <4.

## 2017-10-23 ENCOUNTER — Ambulatory Visit: Payer: BLUE CROSS/BLUE SHIELD | Admitting: Nurse Practitioner

## 2017-10-24 ENCOUNTER — Ambulatory Visit (HOSPITAL_COMMUNITY)
Admission: RE | Admit: 2017-10-24 | Discharge: 2017-10-24 | Disposition: A | Discharge status: Home / Self Care | Payer: BLUE CROSS/BLUE SHIELD | Source: Ambulatory Visit | Attending: Internal Medicine | Admitting: Internal Medicine

## 2017-10-24 DIAGNOSIS — D509 Iron deficiency anemia, unspecified: Secondary | ICD-10-CM | POA: Diagnosis not present

## 2017-10-24 MED ORDER — SODIUM CHLORIDE 0.9 % IV SOLN
510.0000 mg | INTRAVENOUS | Status: DC
  Administered 2017-10-24: 510 mg via INTRAVENOUS
  Filled 2017-10-24: qty 17

## 2017-10-24 NOTE — Discharge Instructions (Signed)

## 2017-10-28 DIAGNOSIS — N200 Calculus of kidney: Secondary | ICD-10-CM | POA: Diagnosis not present

## 2017-10-28 DIAGNOSIS — R8271 Bacteriuria: Secondary | ICD-10-CM | POA: Diagnosis not present

## 2017-10-28 DIAGNOSIS — R1111 Vomiting without nausea: Secondary | ICD-10-CM | POA: Diagnosis not present

## 2017-10-31 ENCOUNTER — Telehealth: Payer: Self-pay | Admitting: Internal Medicine

## 2017-10-31 ENCOUNTER — Encounter (HOSPITAL_COMMUNITY): Payer: BLUE CROSS/BLUE SHIELD

## 2017-10-31 ENCOUNTER — Other Ambulatory Visit (HOSPITAL_COMMUNITY): Payer: Self-pay | Admitting: *Deleted

## 2017-10-31 DIAGNOSIS — N83201 Unspecified ovarian cyst, right side: Secondary | ICD-10-CM

## 2017-10-31 NOTE — Telephone Encounter (Signed)
Patient is asking for pathology results from 10/21/17. Please advise

## 2017-10-31 NOTE — Progress Notes (Signed)
Biopsies are normal Sorry she had to call  [facility] she is better  Should be seeing urology

## 2017-11-03 ENCOUNTER — Encounter (HOSPITAL_COMMUNITY)
Admission: RE | Admit: 2017-11-03 | Discharge: 2017-11-03 | Disposition: A | Discharge status: Home / Self Care | Payer: BLUE CROSS/BLUE SHIELD | Source: Ambulatory Visit | Attending: Internal Medicine | Admitting: Internal Medicine

## 2017-11-03 DIAGNOSIS — D509 Iron deficiency anemia, unspecified: Secondary | ICD-10-CM

## 2017-11-03 MED ORDER — SODIUM CHLORIDE 0.9 % IV SOLN
510.0000 mg | INTRAVENOUS | Status: DC
  Administered 2017-11-03: 510 mg via INTRAVENOUS
  Filled 2017-11-03: qty 17

## 2017-11-03 NOTE — Discharge Instructions (Signed)

## 2017-11-13 DIAGNOSIS — K5904 Chronic idiopathic constipation: Secondary | ICD-10-CM | POA: Diagnosis not present

## 2017-11-17 ENCOUNTER — Telehealth: Payer: Self-pay

## 2017-11-17 NOTE — Telephone Encounter (Signed)
Patient reports that no improvement in her symptoms "nothing has changed" She was not told if there was any growth in her urine culture

## 2017-11-17 NOTE — Telephone Encounter (Signed)
-----   Message from Iva Boop, MD sent at 11/16/2017  3:29 PM EDT ----- Regarding: Follow-up, appt Tell her I saw urology evaluation  1) Did the urine cx grow anything - did they tell her (not part of the note)  2) dr. Vernie Ammons did not think she had sympomatic kidney stones  Is she still having flank pain, nausea and vomiting?

## 2017-11-24 MED ORDER — METOCLOPRAMIDE HCL 10 MG PO TBDP: 10.0000 mg | ORAL_TABLET | Freq: Three times a day (TID) | ORAL | 0 refills | Status: DC

## 2017-11-24 NOTE — Telephone Encounter (Signed)
I called her  Still w/ nausea and vomiting and reflux problems  She has been on Nexium, carafate and now pantoprazole and ondansetron  Plan:  1) metaclopramide 10 mg qac and hs # 120 no refill (I reviewed possible side effects) - she is to try instead of ondansetron BUT may take both  2) schedule pelvic ultrasound transabdominal and vaginal - dx: right ovarian cyst - follow-up (seen 2018 pelvic US)

## 2017-11-24 NOTE — Telephone Encounter (Signed)
Patient has been scheduled for 12/03/17 10:00.  She is asked to arrive at 9:45 and be NPO after midnight at [facility] radiology.  Rx sent

## 2017-11-26 ENCOUNTER — Ambulatory Visit (HOSPITAL_COMMUNITY): Payer: BLUE CROSS/BLUE SHIELD

## 2017-11-28 ENCOUNTER — Encounter: Payer: BLUE CROSS/BLUE SHIELD | Admitting: Internal Medicine

## 2017-12-02 ENCOUNTER — Other Ambulatory Visit: Payer: Self-pay

## 2017-12-02 ENCOUNTER — Other Ambulatory Visit: Payer: Self-pay | Admitting: *Deleted

## 2017-12-02 DIAGNOSIS — K219 Gastro-esophageal reflux disease without esophagitis: Secondary | ICD-10-CM

## 2017-12-02 MED ORDER — PANTOPRAZOLE SODIUM 40 MG PO TBEC: 40.0000 mg | DELAYED_RELEASE_TABLET | Freq: Two times a day (BID) | ORAL | 0 refills | Status: DC

## 2017-12-02 NOTE — Telephone Encounter (Signed)
Needs to speak with nurse about pantoprazole (PROTONIX) 40 MG tablet. Please call back.

## 2017-12-02 NOTE — Telephone Encounter (Signed)
Rtc, request in new encounter

## 2017-12-03 ENCOUNTER — Ambulatory Visit (HOSPITAL_COMMUNITY): Payer: BLUE CROSS/BLUE SHIELD | Attending: Internal Medicine

## 2017-12-03 ENCOUNTER — Ambulatory Visit (HOSPITAL_COMMUNITY): Admission: RE | Admit: 2017-12-03 | Payer: BLUE CROSS/BLUE SHIELD | Source: Ambulatory Visit

## 2017-12-03 ENCOUNTER — Ambulatory Visit: Payer: BLUE CROSS/BLUE SHIELD

## 2017-12-03 ENCOUNTER — Encounter: Payer: Self-pay | Admitting: Internal Medicine

## 2017-12-19 ENCOUNTER — Ambulatory Visit (INDEPENDENT_AMBULATORY_CARE_PROVIDER_SITE_OTHER): Payer: BLUE CROSS/BLUE SHIELD | Admitting: Internal Medicine

## 2017-12-19 ENCOUNTER — Other Ambulatory Visit: Payer: Self-pay

## 2017-12-19 ENCOUNTER — Encounter: Payer: Self-pay | Admitting: Internal Medicine

## 2017-12-19 VITALS — BP 149/76 | HR 76 | Temp 97.8°F | Wt 303.9 lb

## 2017-12-19 DIAGNOSIS — D509 Iron deficiency anemia, unspecified: Secondary | ICD-10-CM | POA: Diagnosis not present

## 2017-12-19 DIAGNOSIS — R1032 Left lower quadrant pain: Secondary | ICD-10-CM

## 2017-12-19 DIAGNOSIS — F329 Major depressive disorder, single episode, unspecified: Secondary | ICD-10-CM

## 2017-12-19 DIAGNOSIS — N200 Calculus of kidney: Secondary | ICD-10-CM | POA: Diagnosis not present

## 2017-12-19 DIAGNOSIS — Z79899 Other long term (current) drug therapy: Secondary | ICD-10-CM

## 2017-12-19 DIAGNOSIS — G43909 Migraine, unspecified, not intractable, without status migrainosus: Secondary | ICD-10-CM | POA: Diagnosis not present

## 2017-12-19 DIAGNOSIS — G43119 Migraine with aura, intractable, without status migrainosus: Secondary | ICD-10-CM

## 2017-12-19 DIAGNOSIS — E282 Polycystic ovarian syndrome: Secondary | ICD-10-CM

## 2017-12-19 DIAGNOSIS — I10 Essential (primary) hypertension: Secondary | ICD-10-CM | POA: Diagnosis not present

## 2017-12-19 DIAGNOSIS — G4733 Obstructive sleep apnea (adult) (pediatric): Secondary | ICD-10-CM

## 2017-12-19 DIAGNOSIS — J45909 Unspecified asthma, uncomplicated: Secondary | ICD-10-CM

## 2017-12-19 MED ORDER — PROPRANOLOL HCL 20 MG PO TABS: 20.0000 mg | ORAL_TABLET | Freq: Four times a day (QID) | ORAL | 1 refills | Status: DC

## 2017-12-19 MED ORDER — SUMATRIPTAN SUCCINATE 50 MG PO TABS: 50.0000 mg | ORAL_TABLET | Freq: Once | ORAL | 1 refills | Status: DC | PRN

## 2017-12-19 NOTE — Patient Instructions (Addendum)
It was a pleasure to see you today [name]. Please make the following changes:  -Headache please start taking sumatriptan 50 mg daily, atenolol 20 mg 4 times daily -Please schedule ultrasound done next Tuesday -We have gotten some blood work for you today I will call you if there are any abnormal results  If you have any questions or concerns, please call our clinic at [phone] between 9am-5pm and after hours call [phone] and ask for the internal medicine resident on call. If you feel you are having a medical emergency please call 911.   Thank you, we look forward to help you remain healthy!  Lorenso Courier, MD Internal Medicine PGY2

## 2017-12-19 NOTE — Assessment & Plan Note (Signed)
The patient had colonoscopy and upper endoscopy done on 10/21/17 which showed normal esophagus, stomach and duodenum. A localized area of mucosa in the terminal ileum wa erythematous, granular, inflamed, and ulcerated which on biopsy returned normal.   Gastroenterology felt that the abdominal pain maybe due to renal stones found on previous ct abdomen (measuring upto 78mm in size). Urology referral was made by gastroenterology.  He was seen by urology and they recommended abdominal ultrasound which the patient is scheduled for on 12/23/2017.  We will request [facility] for records.

## 2017-12-19 NOTE — Progress Notes (Signed)
   CC: Headaches  HPI:  [name] is a 37 y.o. with essential hypertension, headaches, chronic asthma, OSA, PCOS, and major depressive disorder who presents be evaluated regarding her headaches. Please see problem based charting for evaluation, assessment, and plan.    Past Medical History:  Diagnosis Date  . Anemia   . Asthma   . Hypertension   . LUQ abdominal pain 10/16/2017  . Obesity    Review of Systems:    Patient has headaches, abdominal pain, nausea, vomiting Denies chest pain, dizziness, dyspnea  Physical Exam:  Vitals:   12/19/17 1359  BP: (!) 149/76  Pulse: 76  Temp: 97.8 F (36.6 C)  TempSrc: Oral  SpO2: 98%  Weight: (!) 303 lb 14.4 oz (137.8 kg)   Physical Exam  Constitutional: She appears well-developed and well-nourished. No distress.  HENT:  Head: Normocephalic and atraumatic.  Eyes: Conjunctivae are normal.  Cardiovascular: Normal rate, regular rhythm and normal heart sounds.  Respiratory: Effort normal and breath sounds normal. No respiratory distress. She has no wheezes.  GI: Soft. Bowel sounds are normal. She exhibits no distension. There is no tenderness.  Musculoskeletal: She exhibits no edema.  Neurological: She is alert.  Skin: She is not diaphoretic.  Psychiatric: She has a normal mood and affect. Her behavior is normal. Judgment and thought content normal.   Assessment & Plan:   See Encounters Tab for problem based charting.  Patient discussed with Dr. Cleda Daub

## 2017-12-19 NOTE — Assessment & Plan Note (Addendum)
The patient's blood pressure during this visit was 149/76. The patient is currently taking spironolactone 25mg  qd, amlodipine 10mg  qd. His last blood pressure visits are   BP Readings from Last 3 Encounters:  12/19/17 (!) 149/76  11/03/17 (!) 149/96  10/24/17 140/82   The patient does not report palpitations, dizziness, chest pain, sob.  Assessment and Plan The patient's blood pressure continues to not be goal of less than 140/80.  Recommend patient continue spironolactone 25 g daily and amlodipine 10 mg daily along with propranolol 80 mg daily that is also being used for migraine prophylaxis.  I suspect that there is a small component of blood pressure elevation and due to the patient's headache.  Patient's pulse has been ranging mostly in the 60 to 70s range and therefore she should be able to tolerate the beta-blocker.    Spoke to the patient at length regarding need for lifestyle modification healthy diet and exercise.  Encouraged patient to nutritionist for support.

## 2017-12-19 NOTE — Assessment & Plan Note (Signed)
Patient has been having migraine for the past 2 to 3 years occurring once.  Patient states that the migraines usually last a few days.  Her most recent episode started last Sunday (a week ago) and has progressively gotten worse.  She says initially started in her bilateral temples and then went down her neck.  The headaches are accompanied with blurry vision.  She has used Excedrin and [facility] which is only given temporary relief for a few hours.  Assessment and plan Headaches are consistent with migraine.  We will start the patient on sumatriptan 50 mg daily and propranolol 20 mg 4 times daily for prophylaxis.

## 2017-12-19 NOTE — Assessment & Plan Note (Signed)
Patient had Feraheme infusions done on 10/29/2017, 11/03/2017.  GI imaging to find any source of the patient's bleeding is likely that the pain is rather to heavy menses.  Assessment and plan Ordered CBC, ferritin, iron and TIBC to follow-up regarding patient's patient's

## 2017-12-20 LAB — CBC
Hematocrit: 37.2 % (ref 34.0–46.6)
Hemoglobin: 11 g/dL — ABNORMAL LOW (ref 11.1–15.9)
MCH: 21.7 pg — AB (ref 26.6–33.0)
MCHC: 29.6 g/dL — AB (ref 31.5–35.7)
MCV: 73 fL — AB (ref 79–97)
PLATELETS: 290 10*3/uL (ref 150–450)
RBC: 5.08 x10E6/uL (ref 3.77–5.28)
RDW: 23.5 % — AB (ref 12.3–15.4)
WBC: 7.2 10*3/uL (ref 3.4–10.8)

## 2017-12-20 LAB — IRON AND TIBC
IRON SATURATION: 10 % — AB (ref 15–55)
Iron: 25 ug/dL — ABNORMAL LOW (ref 27–159)
TIBC: 253 ug/dL (ref 250–450)
UIBC: 228 ug/dL (ref 131–425)

## 2017-12-20 LAB — FERRITIN: Ferritin: 204 ng/mL — ABNORMAL HIGH (ref 15–150)

## 2017-12-22 NOTE — Progress Notes (Signed)
Internal Medicine Clinic Attending  Case discussed with Dr. Chundi at the time of the visit.  We reviewed the resident's history and exam and pertinent patient test results.  I agree with the assessment, diagnosis, and plan of care documented in the resident's note. 

## 2018-01-15 NOTE — Progress Notes (Deleted)
   CC: ***  HPI:  Ms.Jonai A Beas is a 37 y.o. female with essential hypertension, pcos, mdd, left lower quadrant abdominal pain who presents for hypertension follow up. Please see problem based charting for evaluation, assessment, and plan.   htn  The patient's blood pressure during this visit was ***. The patient is currently taking spironolactone 25mg  qd, amlodipine 10 mg daily, propranolol 20 mg 4 times daily. His last blood pressure visits are  BP Readings from Last 3 Encounters:  12/19/17 (!) 149/76  11/03/17 (!) 149/96  10/24/17 140/82    The patient does/does not *** report palpitations, dizziness, chest pain, sob ***.  Assessment and Plan ***   Left lower quadrant abdominal pain Patient with chronic history of generalized abdominal pain for the past 7 months with accompanying nausea, vomiting, diarrhea.  She had colonoscopy and upper GI EGD done in September 2019 which did not show any significant findings that could be attributed to the abdominal pain.  She was told to follow-up but at urology to be managed for a 6 mm renal stone.  Urology***  Assessment and plan    Asthma The patient states that she has been using her albuterol inhaler ***.      Past Medical History:  Diagnosis Date  . Anemia   . Asthma   . Hypertension   . LUQ abdominal pain 10/16/2017  . Obesity    Review of Systems:  ***  Physical Exam:  There were no vitals filed for this visit. ***  Assessment & Plan:   See Encounters Tab for problem based charting.  Patient {GC/GE:3044014::"discussed with","seen with"} Dr. {NAMES:[id number]::"Butcher","Granfortuna","E. Hoffman","Klima","Mullen","Narendra","Raines","Vincent"}

## 2018-01-16 ENCOUNTER — Encounter: Payer: Self-pay | Admitting: Internal Medicine

## 2018-01-16 ENCOUNTER — Encounter: Payer: BLUE CROSS/BLUE SHIELD | Admitting: Internal Medicine

## 2018-01-16 ENCOUNTER — Encounter: Payer: BLUE CROSS/BLUE SHIELD | Admitting: Dietician

## 2018-01-21 NOTE — Addendum Note (Signed)
Addended by: Neomia Dear on: 01/21/2018 05:58 PM   Modules accepted: Orders

## 2018-02-20 ENCOUNTER — Encounter (INDEPENDENT_AMBULATORY_CARE_PROVIDER_SITE_OTHER): Payer: Self-pay

## 2018-02-20 ENCOUNTER — Encounter: Payer: Self-pay | Admitting: Internal Medicine

## 2018-02-20 ENCOUNTER — Other Ambulatory Visit: Payer: Self-pay

## 2018-02-20 ENCOUNTER — Ambulatory Visit: Payer: BLUE CROSS/BLUE SHIELD | Admitting: Internal Medicine

## 2018-02-20 DIAGNOSIS — K219 Gastro-esophageal reflux disease without esophagitis: Secondary | ICD-10-CM

## 2018-02-20 DIAGNOSIS — J45909 Unspecified asthma, uncomplicated: Secondary | ICD-10-CM | POA: Diagnosis not present

## 2018-02-20 DIAGNOSIS — Z6841 Body Mass Index (BMI) 40.0 and over, adult: Secondary | ICD-10-CM

## 2018-02-20 DIAGNOSIS — K30 Functional dyspepsia: Secondary | ICD-10-CM | POA: Diagnosis not present

## 2018-02-20 DIAGNOSIS — K581 Irritable bowel syndrome with constipation: Secondary | ICD-10-CM | POA: Diagnosis not present

## 2018-02-20 DIAGNOSIS — E669 Obesity, unspecified: Secondary | ICD-10-CM

## 2018-02-20 DIAGNOSIS — K589 Irritable bowel syndrome without diarrhea: Secondary | ICD-10-CM | POA: Insufficient documentation

## 2018-02-20 DIAGNOSIS — I10 Essential (primary) hypertension: Secondary | ICD-10-CM

## 2018-02-20 MED ORDER — PANTOPRAZOLE SODIUM 40 MG PO TBEC: 40.0000 mg | DELAYED_RELEASE_TABLET | Freq: Two times a day (BID) | ORAL | 0 refills | Status: DC

## 2018-02-20 NOTE — Progress Notes (Signed)
   CC: abdominal pain  HPI:  [name] is a 38 y.o. with a PMH of HTN, Asthma, Obesity and chronic abdominal pain presenting to clinic for abdominal pain.  Patient reports about 1 yr history of abdominal pain located in the epigastric region and left upper quadrant/flank. The epigastric pain is burning, pulsating in nature and episodically will cause nausea and vomiting. Epigastric pain and acid reflux symptoms are not improved with PPI, H2 blockers. Left sided pain is aching in nature and constant. Patient has not found a pattern in what makes her abd pain worse or better. She also has associated constipation with bowel movement at most 1-2 times per week, with hard small stools and feeling of incomplete emptying. For her symptoms she has changed her diet to include multiple bottles of water per day, multiple servings of fruits and veggies, she has started exercising, eliminated NSAIDs. She is taking reglan and zofran prn when nausea and vomiting start which somewhat provide relief. Other than Tylenol prn, she denies using other OTCs, teas, or herbal supplements.  She sought a second opinion from [facility] who started [place] for her constipation; patient states it did not improve her symptoms or constipation, and overall she did not feel well after starting the medication which improved when she stopped it.   Work up has included a CT which revealed non obstructing bilateral renal stone up to 6mm. EGD w/o inflammation or ulcers. Colonoscopy revealed mild ileal inflammation with a benign biopsy. She was referred to urology who performed a urogram which did not reveal ureteral stones.   Cmet 10/2017 was unremarable with normal Ca. TSH 10/2017 was wnl. Stool studies 02/2017 were negative.   Please see problem based Assessment and Plan for status of patients chronic conditions.  Past Medical History:  Diagnosis Date  . Anemia   . Asthma   . Hypertension   . LUQ abdominal pain 10/16/2017  .  Obesity     Review of Systems:   Per HPI  Physical Exam:  Vitals:   02/20/18 1335  BP: (!) 151/78  Pulse: 92  Temp: 98 F (36.7 C)  TempSrc: Oral  SpO2: 90%  Weight: (!) 301 lb 14.4 oz (136.9 kg)  Height: 5\' 7"  (1.702 m)   GENERAL- alert, co-operative, appears as stated age, not in any distress. HEENT- oral mucosa appears moist. CARDIAC- RRR, no murmurs, rubs or gallops. RESP- Moving equal volumes of air, and clear to auscultation bilaterally, no wheezes or crackles. ABDOMEN- Soft, nondistended. Epigastric TTP. TTP with even light touch to left lateral upper and lower quadrants extending posteriorly about an inch past the mid-axillary line. No rebound. SKIN- Warm, dry, no rash or lesion on abdomen.  Assessment & Plan:   See Encounters Tab for problem based charting.   Patient discussed with Dr. Lorin PicketVincent   Faysal Fenoglio, MD Internal Medicine PGY-3

## 2018-02-20 NOTE — Progress Notes (Signed)
Internal Medicine Clinic Attending  Case discussed with Dr. Svalina  at the time of the visit.  We reviewed the resident's history and exam and pertinent patient test results.  I agree with the assessment, diagnosis, and plan of care documented in the resident's note.  

## 2018-02-20 NOTE — Assessment & Plan Note (Signed)
Patient with 1 year history of abdominal pain, change in bowel habits and consistency, consistent with the diagnosis of IBS. She has had an extensive, negative workup.   Plan: --low-FODMAP diet --advised continuing high fiber diet with adequate hydration and regular exercise --continue daily to BID miralax --add daily to BID senna or bicosidyl --f/u in 1 month --if no improvement at f/u, consider Linaclotide (secretagogue which acts differently from the [place]) and PT referral for neuromuscular re-education with biofeedback

## 2018-02-20 NOTE — Patient Instructions (Addendum)
For your constipation: Continue eating high fiber diet Drink at least 80 ounces of water a day Continue daily to twice daily miralax Start taking daily to twice daily senna or bicosidyl Continue increasing your activity like you have been doing  Try to eliminate dairy (lactose), gluten, and follow the below eating plan.    Low-FODMAP Eating Plan  FODMAPs (fermentable oligosaccharides, disaccharides, monosaccharides, and polyols) are sugars that are hard for some people to digest. A low-FODMAP eating plan may help some people who have bowel (intestinal) diseases to manage their symptoms. This meal plan can be complicated to follow. Work with a diet and nutrition specialist (dietitian) to make a low-FODMAP eating plan that is right for you. A dietitian can make sure that you get enough nutrition from this diet. What are tips for following this plan? Reading food labels  Check labels for hidden FODMAPs such as: ? High-fructose syrup. ? Honey. ? Agave. ? Natural fruit flavors. ? Onion or garlic powder.  Choose low-FODMAP foods that contain 3-4 grams of fiber per serving.  Check food labels for serving sizes. Eat only one serving at a time to make sure FODMAP levels stay low. Meal planning  Follow a low-FODMAP eating plan for up to 6 weeks, or as told by your health care provider or dietitian.  To follow the eating plan: 1. Eliminate high-FODMAP foods from your diet completely. 2. Gradually reintroduce high-FODMAP foods into your diet one at a time. Most people should wait a few days after introducing one high-FODMAP food before they introduce the next high-FODMAP food. Your dietitian can recommend how quickly you may reintroduce foods. 3. Keep a daily record of what you eat and drink, and make note of any symptoms that you have after eating. 4. Review your daily record with a dietitian regularly. Your dietitian can help you identify which foods you can eat and which foods you should  avoid. General tips  Drink enough fluid each day to keep your urine pale yellow.  Avoid processed foods. These often have added sugar and may be high in FODMAPs.  Avoid most dairy products, whole grains, and sweeteners.  Work with a dietitian to make sure you get enough fiber in your diet. Recommended foods Grains  Gluten-free grains, such as rice, oats, buckwheat, quinoa, corn, polenta, and millet. Gluten-free pasta, bread, or cereal. Rice noodles. Corn tortillas. Vegetables  Eggplant, zucchini, cucumber, peppers, green beans, Brussels sprouts, bean sprouts, lettuce, arugula, kale, Swiss chard, spinach, collard greens, bok choy, summer squash, potato, and tomato. Limited amounts of corn, carrot, and sweet potato. Green parts of scallions. Fruits  Bananas, oranges, lemons, limes, blueberries, raspberries, strawberries, grapes, cantaloupe, honeydew melon, kiwi, papaya, passion fruit, and pineapple. Limited amounts of dried cranberries, banana chips, and shredded coconut. Dairy  Lactose-free milk, yogurt, and kefir. Lactose-free cottage cheese and ice cream. Non-dairy milks, such as almond, coconut, hemp, and rice milk. Yogurts made of non-dairy milks. Limited amounts of goat cheese, brie, mozzarella, parmesan, swiss, and other hard cheeses. Meats and other protein foods  Unseasoned beef, pork, poultry, or fish. Eggs. Tomasa Blase. Tofu (firm) and tempeh. Limited amounts of nuts and seeds, such as almonds, walnuts, [place] nuts, pecans, peanuts, pumpkin seeds, chia seeds, and sunflower seeds. Fats and oils  Butter-free spreads. Vegetable oils, such as olive, canola, and sunflower oil. Seasoning and other foods  Artificial sweeteners with names that do not end in "ol" such as aspartame, saccharine, and stevia. Maple syrup, white table sugar, raw sugar, Castrogiovanni sugar,  and molasses. Fresh basil, coriander, parsley, rosemary, and thyme. Beverages  Water and mineral water. Sugar-sweetened soft  drinks. Small amounts of orange juice or cranberry juice. Black and green tea. Most dry wines. Coffee. This may not be a complete list of low-FODMAP foods. Talk with your dietitian for more information. Foods to avoid Grains  Wheat, including kamut, durum, and semolina. Barley and bulgur. Couscous. Wheat-based cereals. Wheat noodles, bread, crackers, and pastries. Vegetables  Chicory root, artichoke, asparagus, cabbage, snow peas, sugar snap peas, mushrooms, and cauliflower. Onions, garlic, leeks, and the white part of scallions. Fruits  Fresh, dried, and juiced forms of apple, pear, watermelon, peach, plum, cherries, apricots, blackberries, boysenberries, figs, nectarines, and mango. Avocado. Dairy  Milk, yogurt, ice cream, and soft cheese. Cream and sour cream. Milk-based sauces. Custard. Meats and other protein foods  Fried or fatty meat. Sausage. Cashews and pistachios. Soybeans, baked beans, black beans, chickpeas, kidney beans, fava beans, navy beans, lentils, and split peas. Seasoning and other foods  Any sugar-free gum or candy. Foods that contain artificial sweeteners such as sorbitol, mannitol, isomalt, or xylitol. Foods that contain honey, high-fructose corn syrup, or agave. Bouillon, vegetable stock, beef stock, and chicken stock. Garlic and onion powder. Condiments made with onion, such as hummus, chutney, pickles, relish, salad dressing, and salsa. Tomato paste. Beverages  Chicory-based drinks. Coffee substitutes. Chamomile tea. Fennel tea. Sweet or fortified wines such as port or sherry. Diet soft drinks made with isomalt, mannitol, maltitol, sorbitol, or xylitol. Apple, pear, and mango juice. Juices with high-fructose corn syrup. This may not be a complete list of high-FODMAP foods. Talk with your dietitian to discuss what dietary choices are best for you.  Summary  A low-FODMAP eating plan is a short-term diet that eliminates FODMAPs from your diet to help ease symptoms of  certain bowel diseases.  The eating plan usually lasts up to 6 weeks. After that, high-FODMAP foods are restarted gradually, one at a time, so you can find out which may be causing symptoms.  A low-FODMAP eating plan can be complicated. It is best to work with a dietitian who has experience with this type of plan. This information is not intended to replace advice given to you by your health care provider. Make sure you discuss any questions you have with your health care provider. Document Released: 09/24/2016 Document Revised: 09/24/2016 Document Reviewed: 09/24/2016 Elsevier Interactive Patient Education  [employer].

## 2018-02-20 NOTE — Assessment & Plan Note (Signed)
Patient with epigastric burning and pain without endoscopic evidence for disease consistent with diagnosis of functional dyspepsia.   Plan: --continue PPI --advised on low-FODMAP diet, avoidance of dairy --f/u in 1 month --if no improvement in symptoms can try changing reglan to buspar or a tricyclic

## 2018-03-05 ENCOUNTER — Telehealth: Payer: Self-pay | Admitting: Internal Medicine

## 2018-03-17 ENCOUNTER — Ambulatory Visit: Payer: BLUE CROSS/BLUE SHIELD

## 2018-03-17 ENCOUNTER — Encounter: Payer: Self-pay | Admitting: Internal Medicine

## 2018-03-25 ENCOUNTER — Encounter (HOSPITAL_COMMUNITY): Payer: Self-pay

## 2018-03-25 ENCOUNTER — Ambulatory Visit (HOSPITAL_COMMUNITY)
Admission: EM | Admit: 2018-03-25 | Discharge: 2018-03-25 | Disposition: A | Discharge status: Home / Self Care | Payer: BLUE CROSS/BLUE SHIELD | Attending: Internal Medicine | Admitting: Internal Medicine

## 2018-03-25 DIAGNOSIS — J019 Acute sinusitis, unspecified: Secondary | ICD-10-CM | POA: Diagnosis not present

## 2018-03-25 MED ORDER — DOXYCYCLINE HYCLATE 100 MG PO CAPS: 100.0000 mg | ORAL_CAPSULE | Freq: Two times a day (BID) | ORAL | 0 refills | Status: AC

## 2018-03-25 MED ORDER — FLUTICASONE PROPIONATE 50 MCG/ACT NA SUSP: 1.0000 | Freq: Every day | NASAL | 0 refills | Status: DC

## 2018-03-25 NOTE — Discharge Instructions (Signed)
Begin doxycycline twice daily to treat sinus infection Continue claritin Begin flonase  Sore throat: Please continue Tylenol or Ibuprofen for fever and pain. May try salt water gargles, cepacol lozenges, throat spray, or OTC cold relief medicine for throat discomfort. If you also have congestion take a daily anti-histamine like Zyrtec, Claritin, and a oral decongestant to help with post nasal drip that may be irritating your throat.   Stay hydrated and drink plenty of fluids to keep your throat coated relieve irritation.

## 2018-03-25 NOTE — ED Triage Notes (Signed)
Pt presents with sore throat, right ear pain and ongoing headache.

## 2018-03-26 NOTE — ED Provider Notes (Signed)
[facility]    CSN: [id number] Arrival date & time: 03/25/18  1834     History   Chief Complaint Chief Complaint  Patient presents with  . Sore Throat  . Headache  . Otalgia    Right Ear     HPI Victoria Booth is a 38 y.o. female history of GERD, hypertension, PCOS, presenting today for evaluation of URI symptoms.  Patient states that she has had nasal congestion, and headache.  More recently she has developed ear discomfort and sore throat.  Symptoms have been going on for approximately 2 weeks.  She has tried Claritin, Cepacol, Robitussin and Alka-Seltzer.  She denies any fevers.  Her main complaint is the pressure and headaches.  Denies associated GI upset.  HPI  Past Medical History:  Diagnosis Date  . Anemia   . Asthma   . Hypertension   . LUQ abdominal pain 10/16/2017  . Obesity     Patient Active Problem List   Diagnosis Date Noted  . Functional dyspepsia 02/20/2018  . IBS (irritable colon syndrome) 02/20/2018  . Iron deficiency anemia 08/18/2017  . Acute pain of left shoulder 08/18/2017  . Insomnia 07/29/2017  . Left lower quadrant abdominal pain of unknown etiology 07/13/2017  . OSA (obstructive sleep apnea) 06/05/2017  . Major depressive disorder 03/21/2017  . Migraine 10/24/2016  . Hematuria 09/24/2016  . Prediabetes 08/21/2016  . Allergic rhinitis 06/18/2016  . Left flank pain 06/05/2016  . PCOS (polycystic ovarian syndrome) 04/05/2016  . Essential hypertension, benign 12/01/2014  . Asthma, chronic 12/01/2014  . Microcytic anemia 12/01/2014  . Gastroesophageal reflux disease 12/01/2014  . Preventative health care 12/01/2014    Past Surgical History:  Procedure Laterality Date  . TOENAIL EXCISION      OB History   No obstetric history on file.      Home Medications    Prior to Admission medications   Medication Sig Start Date End Date Taking? Authorizing Provider  albuterol (PROVENTIL HFA;VENTOLIN HFA) 108 (90 Base) MCG/ACT  inhaler Inhale 1-2 puffs into the lungs every 6 (six) hours as needed for wheezing or shortness of breath. 09/08/17   Anne Shutter, MD  amLODipine (NORVASC) 10 MG tablet Take 10 mg by mouth daily.    [provider]  doxycycline (VIBRAMYCIN) 100 MG capsule Take 1 capsule (100 mg total) by mouth 2 (two) times daily for 10 days. 03/25/18 04/04/18  Damarea Merkel C, PA-C  fluticasone (FLONASE) 50 MCG/ACT nasal spray Place 1-2 sprays into both nostrils daily for 7 days. 03/25/18 04/01/18  Silena Wyss C, PA-C  Metoclopramide HCl 10 MG TBDP Take 1 tablet (10 mg total) by mouth 4 (four) times daily -  before meals and at bedtime. 11/24/17   Iva Boop, MD  ondansetron (ZOFRAN-ODT) 8 MG disintegrating tablet Take 1 tablet (8 mg total) by mouth 3 (three) times daily. 10/21/17   Iva Boop, MD  pantoprazole (PROTONIX) 40 MG tablet Take 1 tablet (40 mg total) by mouth 2 (two) times daily. 02/20/18 05/21/18  Nyra Market, MD  propranolol (INDERAL) 20 MG tablet Take 1 tablet (20 mg total) by mouth 4 (four) times daily. 12/19/17 01/18/18  Lorenso Courier, MD  spironolactone (ALDACTONE) 25 MG tablet Take 1 tablet (25 mg total) by mouth daily for 90 doses. Patient taking differently: Take 50 mg by mouth daily.  09/11/17 12/10/17  Lorenso Courier, MD  SUMAtriptan (IMITREX) 50 MG tablet Take 1 tablet (50 mg total) by mouth once as  needed for migraine. May repeat in 2 hours if headache persists or recurs. 12/19/17 03/19/18  Lorenso Courierhundi, Vahini, MD    Family History Family History  Problem Relation Age of Onset  . Diabetes Mother   . Hypertension Mother   . Heart attack Mother        2016- 52 yrs  . Breast cancer Maternal Grandmother   . Colon cancer Neg Hx   . Stomach cancer Neg Hx   . Pancreatic cancer Neg Hx     Social History Social History   Tobacco Use  . Smoking status: Former Smoker    Packs/day: 0.10    Types: Cigarettes    Last attempt to quit: 04/06/2015    Years since quitting:  2.9  . Smokeless tobacco: Never Used  Substance Use Topics  . Alcohol use: No    Alcohol/week: 0.0 standard drinks  . Drug use: No     Allergies   Penicillins; Shellfish allergy; Sulfa antibiotics; Keflex [cephalexin]; Lisinopril; and Iodine   Review of Systems Review of Systems  Constitutional: Negative for activity change, appetite change, chills, fatigue and fever.  HENT: Positive for congestion, ear pain, rhinorrhea, sinus pressure and sore throat. Negative for trouble swallowing.   Eyes: Negative for discharge and redness.  Respiratory: Positive for cough. Negative for chest tightness and shortness of breath.   Cardiovascular: Negative for chest pain.  Gastrointestinal: Negative for abdominal pain, diarrhea, nausea and vomiting.  Musculoskeletal: Negative for myalgias.  Skin: Negative for rash.  Neurological: Positive for headaches. Negative for dizziness and light-headedness.     Physical Exam Triage Vital Signs ED Triage Vitals  Enc Vitals Group     BP 03/25/18 1918 (!) 143/86     Pulse Rate 03/25/18 1918 75     Resp 03/25/18 1918 20     Temp 03/25/18 1918 98 F (36.7 C)     Temp Source 03/25/18 1918 Oral     SpO2 03/25/18 1918 100 %     Weight --      Height --      Head Circumference --      Peak Flow --      Pain Score 03/25/18 1923 9     Pain Loc --      Pain Edu? --      Excl. in GC? --    No data found.  Updated Vital Signs BP (!) 143/86 (BP Location: Left Arm)   Pulse 75   Temp 98 F (36.7 C) (Oral)   Resp 20   SpO2 100%   Visual Acuity Right Eye Distance:   Left Eye Distance:   Bilateral Distance:    Right Eye Near:   Left Eye Near:    Bilateral Near:     Physical Exam Vitals signs and nursing note reviewed.  Constitutional:      General: She is not in acute distress.    Appearance: She is well-developed.  HENT:     Head: Normocephalic and atraumatic.     Ears:     Comments: Bilateral ears without tenderness to palpation of  external auricle, tragus and mastoid, EAC's without erythema or swelling, TM's with good bony landmarks and cone of light. Non erythematous.     Nose:     Comments: Nasal mucosa swollen    Mouth/Throat:     Comments: Oral mucosa pink and moist, no tonsillar enlargement or exudate. Posterior pharynx patent and nonerythematous, no uvula deviation or swelling. Normal phonation. Eyes:  Conjunctiva/sclera: Conjunctivae normal.  Neck:     Musculoskeletal: Neck supple.  Cardiovascular:     Rate and Rhythm: Normal rate and regular rhythm.     Heart sounds: No murmur.  Pulmonary:     Effort: Pulmonary effort is normal. No respiratory distress.     Breath sounds: Normal breath sounds.     Comments: Breathing comfortably at rest, CTABL, no wheezing, rales or other adventitious sounds auscultated Abdominal:     Palpations: Abdomen is soft.     Tenderness: There is no abdominal tenderness.  Skin:    General: Skin is warm and dry.  Neurological:     Mental Status: She is alert.      UC Treatments / Results  Labs (all labs ordered are listed, but only abnormal results are displayed) Labs Reviewed - No data to display  EKG None  Radiology No results found.  Procedures Procedures (including critical care time)  Medications Ordered in UC Medications - No data to display  Initial Impression / Assessment and Plan / UC Course  I have reviewed the triage vital signs and the nursing notes.  Pertinent labs & imaging results that were available during my care of the patient were reviewed by me and considered in my medical decision making (see chart for details).     URI symptoms x2 weeks, exam nonfocal, lungs clear, vital signs stable.  Will treat for sinusitis given length of symptoms, will provide cycling and Flonase, continue Claritin.  Discussed symptomatic recommendations for sore throat.Discussed strict return precautions. Patient verbalized understanding and is agreeable with  plan.  Final Clinical Impressions(s) / UC Diagnoses   Final diagnoses:  Acute sinusitis with symptoms > 10 days     Discharge Instructions     Begin doxycycline twice daily to treat sinus infection Continue claritin Begin flonase  Sore throat: Please continue Tylenol or Ibuprofen for fever and pain. May try salt water gargles, cepacol lozenges, throat spray, or OTC cold relief medicine for throat discomfort. If you also have congestion take a daily anti-histamine like Zyrtec, Claritin, and a oral decongestant to help with post nasal drip that may be irritating your throat.   Stay hydrated and drink plenty of fluids to keep your throat coated relieve irritation.    ED Prescriptions    Medication Sig Dispense Auth. Provider   doxycycline (VIBRAMYCIN) 100 MG capsule Take 1 capsule (100 mg total) by mouth 2 (two) times daily for 10 days. 20 capsule Chimene Salo C, PA-C   fluticasone (FLONASE) 50 MCG/ACT nasal spray Place 1-2 sprays into both nostrils daily for 7 days. 1 g Tasheika Kitzmiller, [place] C, PA-C     Controlled Substance Prescriptions [place] Controlled Substance Registry consulted? Not Applicable   Lew Dawes, [place] 03/26/18 1145

## 2018-03-28 ENCOUNTER — Other Ambulatory Visit: Payer: Self-pay | Admitting: Internal Medicine

## 2018-04-09 ENCOUNTER — Encounter (HOSPITAL_COMMUNITY): Payer: Self-pay

## 2018-04-09 ENCOUNTER — Ambulatory Visit (HOSPITAL_COMMUNITY)
Admission: EM | Admit: 2018-04-09 | Discharge: 2018-04-09 | Disposition: A | Discharge status: Home / Self Care | Payer: BLUE CROSS/BLUE SHIELD | Attending: Family Medicine | Admitting: Family Medicine

## 2018-04-09 DIAGNOSIS — G43909 Migraine, unspecified, not intractable, without status migrainosus: Secondary | ICD-10-CM

## 2018-04-09 MED ORDER — DEXAMETHASONE SODIUM PHOSPHATE 10 MG/ML IJ SOLN
INTRAMUSCULAR | Status: AC
  Filled 2018-04-09: qty 1

## 2018-04-09 MED ORDER — KETOROLAC TROMETHAMINE 60 MG/2ML IM SOLN
60.0000 mg | Freq: Once | INTRAMUSCULAR | Status: AC
  Administered 2018-04-09: 60 mg via INTRAMUSCULAR

## 2018-04-09 MED ORDER — METOCLOPRAMIDE HCL 5 MG/ML IJ SOLN
5.0000 mg | Freq: Once | INTRAMUSCULAR | Status: AC
  Administered 2018-04-09: 5 mg via INTRAMUSCULAR

## 2018-04-09 MED ORDER — DEXAMETHASONE SODIUM PHOSPHATE 10 MG/ML IJ SOLN
10.0000 mg | Freq: Once | INTRAMUSCULAR | Status: AC
  Administered 2018-04-09: 10 mg via INTRAMUSCULAR

## 2018-04-09 MED ORDER — ONDANSETRON 4 MG PO TBDP: 4.0000 mg | ORAL_TABLET | Freq: Three times a day (TID) | ORAL | 0 refills | Status: DC | PRN

## 2018-04-09 MED ORDER — KETOROLAC TROMETHAMINE 60 MG/2ML IM SOLN
INTRAMUSCULAR | Status: AC
  Filled 2018-04-09: qty 2

## 2018-04-09 MED ORDER — NAPROXEN 500 MG PO TABS: 500.0000 mg | ORAL_TABLET | Freq: Two times a day (BID) | ORAL | 0 refills | Status: DC

## 2018-04-09 MED ORDER — METOCLOPRAMIDE HCL 5 MG/ML IJ SOLN
INTRAMUSCULAR | Status: AC
  Filled 2018-04-09: qty 2

## 2018-04-09 NOTE — ED Provider Notes (Addendum)
[facility]    CSN: [id number] Arrival date & time: 04/09/18  1734     History   Chief Complaint Chief Complaint  Patient presents with  . Migraine    HPI Victoria Booth is a 38 y.o. female history of migraines, prediabetic, PCOS, hypertension, asthma presenting today for evaluation of a headache.  Patient states that she has had a headache since she woke up yesterday.  It is gradually worsened since.  It was initially located on the right side, but is not on the left side.  She has had associated nausea but denies vomiting.  Denies photophobia or phonophobia.  She is noted some occasional blurry vision, but overall denies vision changes.  She does state that she has a history of headaches and they are similar nature but if never been this intense.  She has tried ibuprofen, Tylenol, Excedrin and Goody powders.  Denies any difficulty speaking, weakness.  Patient has had recent sinus issues, she was recently treated for sinusitis with doxycycline which did improve her symptoms, but has had worsening congestion recently as well.  She feels her headache is separate from her sinus symptoms as her headache is more temporal rather than of her sinuses.  Denies head injury or head trauma.  HPI  Past Medical History:  Diagnosis Date  . Anemia   . Asthma   . Hypertension   . LUQ abdominal pain 10/16/2017  . Obesity     Patient Active Problem List   Diagnosis Date Noted  . Functional dyspepsia 02/20/2018  . IBS (irritable colon syndrome) 02/20/2018  . Iron deficiency anemia 08/18/2017  . Acute pain of left shoulder 08/18/2017  . Insomnia 07/29/2017  . Left lower quadrant abdominal pain of unknown etiology 07/13/2017  . OSA (obstructive sleep apnea) 06/05/2017  . Major depressive disorder 03/21/2017  . Migraine 10/24/2016  . Hematuria 09/24/2016  . Prediabetes 08/21/2016  . Allergic rhinitis 06/18/2016  . Left flank pain 06/05/2016  . PCOS (polycystic ovarian syndrome)  04/05/2016  . Essential hypertension, benign 12/01/2014  . Asthma, chronic 12/01/2014  . Microcytic anemia 12/01/2014  . Gastroesophageal reflux disease 12/01/2014  . Preventative health care 12/01/2014    Past Surgical History:  Procedure Laterality Date  . TOENAIL EXCISION      OB History   No obstetric history on file.      Home Medications    Prior to Admission medications   Medication Sig Start Date End Date Taking? Authorizing Provider  albuterol (PROVENTIL HFA;VENTOLIN HFA) 108 (90 Base) MCG/ACT inhaler INHALE 1 TO 2 PUFFS INTO THE LUNGS EVERY 6 HOURS AS NEEDED FOR WHEEZING OR SHORTNESS OF BREATH 03/31/18   Chundi, Vahini, MD  amLODipine (NORVASC) 10 MG tablet Take 10 mg by mouth daily.    [provider]  fluticasone (FLONASE) 50 MCG/ACT nasal spray Place 1-2 sprays into both nostrils daily for 7 days. 03/25/18 04/01/18  Wieters, Hallie C, PA-C  Metoclopramide HCl 10 MG TBDP Take 1 tablet (10 mg total) by mouth 4 (four) times daily -  before meals and at bedtime. 11/24/17   Iva Boop, MD  naproxen (NAPROSYN) 500 MG tablet Take 1 tablet (500 mg total) by mouth 2 (two) times daily. 04/09/18   Wieters, Hallie C, PA-C  ondansetron (ZOFRAN ODT) 4 MG disintegrating tablet Take 1 tablet (4 mg total) by mouth every 8 (eight) hours as needed for nausea or vomiting. 04/09/18   Wieters, Hallie C, PA-C  pantoprazole (PROTONIX) 40 MG tablet Take  1 tablet (40 mg total) by mouth 2 (two) times daily. 02/20/18 05/21/18  Nyra Market, MD  propranolol (INDERAL) 20 MG tablet Take 1 tablet (20 mg total) by mouth 4 (four) times daily. 12/19/17 01/18/18  Lorenso Courier, MD  spironolactone (ALDACTONE) 25 MG tablet Take 1 tablet (25 mg total) by mouth daily for 90 doses. Patient taking differently: Take 50 mg by mouth daily.  09/11/17 12/10/17  Lorenso Courier, MD  SUMAtriptan (IMITREX) 50 MG tablet Take 1 tablet (50 mg total) by mouth once as needed for migraine. May repeat in 2 hours if  headache persists or recurs. 12/19/17 03/19/18  Lorenso Courier, MD    Family History Family History  Problem Relation Age of Onset  . Diabetes Mother   . Hypertension Mother   . Heart attack Mother        2016- 52 yrs  . Breast cancer Maternal Grandmother   . Colon cancer Neg Hx   . Stomach cancer Neg Hx   . Pancreatic cancer Neg Hx     Social History Social History   Tobacco Use  . Smoking status: Former Smoker    Packs/day: 0.10    Types: Cigarettes    Last attempt to quit: 04/06/2015    Years since quitting: 3.0  . Smokeless tobacco: Never Used  Substance Use Topics  . Alcohol use: No    Alcohol/week: 0.0 standard drinks  . Drug use: No     Allergies   Penicillins; Shellfish allergy; Sulfa antibiotics; Keflex [cephalexin]; Lisinopril; and Iodine   Review of Systems Review of Systems  Constitutional: Negative for fatigue and fever.  HENT: Negative for congestion, sinus pressure and sore throat.   Eyes: Negative for photophobia, pain and visual disturbance.  Respiratory: Negative for cough and shortness of breath.   Cardiovascular: Negative for chest pain.  Gastrointestinal: Positive for nausea. Negative for abdominal pain and vomiting.  Genitourinary: Negative for decreased urine volume and hematuria.  Musculoskeletal: Negative for myalgias, neck pain and neck stiffness.  Neurological: Positive for headaches. Negative for dizziness, syncope, facial asymmetry, speech difficulty, weakness, light-headedness and numbness.     Physical Exam Triage Vital Signs ED Triage Vitals [04/09/18 1846]  Enc Vitals Group     BP (!) 155/90     Pulse Rate 67     Resp 20     Temp 98.9 F (37.2 C)     Temp Source Oral     SpO2 100 %     Weight      Height      Head Circumference      Peak Flow      Pain Score 10     Pain Loc      Pain Edu?      Excl. in GC?    No data found.  Updated Vital Signs BP (!) 155/90 (BP Location: Right Arm)   Pulse 67   Temp 98.9 F  (37.2 C) (Oral)   Resp 20   SpO2 100%   Visual Acuity Right Eye Distance:   Left Eye Distance:   Bilateral Distance:    Right Eye Near:   Left Eye Near:    Bilateral Near:     Physical Exam Vitals signs and nursing note reviewed.  Constitutional:      General: She is not in acute distress.    Appearance: She is well-developed.     Comments: Laying head on table, slightly tearful, appears uncomfortable  HENT:     Head: Normocephalic  and atraumatic.     Ears:     Comments: Bilateral cerumen impaction    Mouth/Throat:     Comments: Oral mucosa pink and moist, no tonsillar enlargement or exudate. Posterior pharynx patent and nonerythematous, no uvula deviation or swelling. Normal phonation. Eyes:     Extraocular Movements: Extraocular movements intact.     Conjunctiva/sclera: Conjunctivae normal.     Pupils: Pupils are equal, round, and reactive to light.     Comments: Initially when testing extraocular motions had some slight horizontal nystagmus when looking towards the left, this was not repeatable.  Neck:     Musculoskeletal: Neck supple.     Comments: Full active range of motion of neck, negative kernig/brudzinski Cardiovascular:     Rate and Rhythm: Normal rate and regular rhythm.     Heart sounds: No murmur.  Pulmonary:     Effort: Pulmonary effort is normal. No respiratory distress.     Breath sounds: Normal breath sounds.     Comments: Breathing comfortably at rest, CTABL, no wheezing, rales or other adventitious sounds auscultated Abdominal:     Palpations: Abdomen is soft.     Tenderness: There is no abdominal tenderness.  Skin:    General: Skin is warm and dry.  Neurological:     General: No focal deficit present.     Mental Status: She is alert and oriented to person, place, and time. Mental status is at baseline.     Comments: Patient A&O x3, cranial nerves II-XII grossly intact, strength at shoulders, hips and knees 5/5, equal bilaterally, patellar reflex  2+ bilaterally.  Gait without abnormality.      UC Treatments / Results  Labs (all labs ordered are listed, but only abnormal results are displayed) Labs Reviewed - No data to display  EKG None  Radiology No results found.  Procedures Procedures (including critical care time)  Medications Ordered in UC Medications  ketorolac (TORADOL) injection 60 mg (60 mg Intramuscular Given 04/09/18 1947)  metoCLOPramide (REGLAN) injection 5 mg (5 mg Intramuscular Given 04/09/18 1947)  dexamethasone (DECADRON) injection 10 mg (10 mg Intramuscular Given 04/09/18 1948)    Initial Impression / Assessment and Plan / UC Course  I have reviewed the triage vital signs and the nursing notes.  Pertinent labs & imaging results that were available during my care of the patient were reviewed by me and considered in my medical decision making (see chart for details).     Neurologically intact, vital signs stable. No nuchal rigidity. History of migraines, will treat as such with Toradol, Decadron and Reglan in clinic.  Will send home with Zofran and Naprosyn for continued headache management.  Given headache is more intense than her typical headaches advised that if her headache is not resolving with this or symptoms worsening to go to the emergency room for further evaluation.  Continue to monitor,Discussed strict return precautions. Patient verbalized understanding and is agreeable with plan.  Final Clinical Impressions(s) / UC Diagnoses   Final diagnoses:  Migraine without status migrainosus, not intractable, unspecified migraine type     Discharge Instructions     We gave you Toradol, Decadron and reglan for your headache, these should begin working in 30-40 min.  For further headache relief at home: You may take up to 800 mg Ibuprofen every 8 hours with food. You may supplement Ibuprofen with Tylenol [phone] mg every 8 hours.  OR Naprosyn twice daily as needed Zofran as needed for  nausea  Follow up in ED  if headache not improving, worsening, developing vision changes, weakness, dizziness, lighteheadedness   ED Prescriptions    Medication Sig Dispense Auth. Provider   naproxen (NAPROSYN) 500 MG tablet Take 1 tablet (500 mg total) by mouth 2 (two) times daily. 30 tablet Wieters, Hallie C, PA-C   ondansetron (ZOFRAN ODT) 4 MG disintegrating tablet Take 1 tablet (4 mg total) by mouth every 8 (eight) hours as needed for nausea or vomiting. 20 tablet Wieters, [place] C, PA-C     Controlled Substance Prescriptions [place] Controlled Substance Registry consulted? Not Applicable   Lew Dawes, PA-C 04/09/18 2017    Lew Dawes, [place] 04/09/18 2040

## 2018-04-09 NOTE — Discharge Instructions (Signed)
We gave you Toradol, Decadron and reglan for your headache, these should begin working in 30-40 min.  For further headache relief at home: You may take up to 800 mg Ibuprofen every 8 hours with food. You may supplement Ibuprofen with Tylenol [phone] mg every 8 hours.  OR Naprosyn twice daily as needed Zofran as needed for nausea  Follow up in ED if headache not improving, worsening, developing vision changes, weakness, dizziness, lighteheadedness

## 2018-04-09 NOTE — ED Triage Notes (Signed)
Pt present a headache, symptoms started yesterday.  Pt tried otc medication with no relief.

## 2018-04-14 ENCOUNTER — Emergency Department (HOSPITAL_COMMUNITY)
Admission: EM | Admit: 2018-04-14 | Discharge: 2018-04-15 | Disposition: A | Discharge status: Home / Self Care | Payer: BLUE CROSS/BLUE SHIELD | Attending: Emergency Medicine | Admitting: Emergency Medicine

## 2018-04-14 ENCOUNTER — Emergency Department (HOSPITAL_COMMUNITY): Payer: BLUE CROSS/BLUE SHIELD

## 2018-04-14 ENCOUNTER — Other Ambulatory Visit: Payer: Self-pay

## 2018-04-14 DIAGNOSIS — Z880 Allergy status to penicillin: Secondary | ICD-10-CM | POA: Diagnosis not present

## 2018-04-14 DIAGNOSIS — Z87891 Personal history of nicotine dependence: Secondary | ICD-10-CM | POA: Diagnosis not present

## 2018-04-14 DIAGNOSIS — Z79899 Other long term (current) drug therapy: Secondary | ICD-10-CM | POA: Insufficient documentation

## 2018-04-14 DIAGNOSIS — R519 Headache, unspecified: Secondary | ICD-10-CM

## 2018-04-14 DIAGNOSIS — I10 Essential (primary) hypertension: Secondary | ICD-10-CM | POA: Diagnosis not present

## 2018-04-14 DIAGNOSIS — R51 Headache: Secondary | ICD-10-CM | POA: Diagnosis not present

## 2018-04-14 LAB — CBC WITH DIFFERENTIAL/PLATELET
Abs Immature Granulocytes: 0 10*3/uL (ref 0.00–0.07)
Basophils Absolute: 0.1 10*3/uL (ref 0.0–0.1)
Basophils Relative: 1 %
Eosinophils Absolute: 0.3 10*3/uL (ref 0.0–0.5)
Eosinophils Relative: 3 %
HEMATOCRIT: 40.7 % (ref 36.0–46.0)
HEMOGLOBIN: 11.8 g/dL — AB (ref 12.0–15.0)
Lymphocytes Relative: 48 %
Lymphs Abs: 5 10*3/uL — ABNORMAL HIGH (ref 0.7–4.0)
MCH: 23.5 pg — ABNORMAL LOW (ref 26.0–34.0)
MCHC: 29 g/dL — ABNORMAL LOW (ref 30.0–36.0)
MCV: 80.9 fL (ref 80.0–100.0)
MONO ABS: 0.2 10*3/uL (ref 0.1–1.0)
MONOS PCT: 2 %
NEUTROS ABS: 4.8 10*3/uL (ref 1.7–7.7)
Neutrophils Relative %: 46 %
Platelets: 306 10*3/uL (ref 150–400)
RBC: 5.03 MIL/uL (ref 3.87–5.11)
RDW: 17.1 % — ABNORMAL HIGH (ref 11.5–15.5)
WBC: 10.5 10*3/uL (ref 4.0–10.5)
nRBC: 0 % (ref 0.0–0.2)

## 2018-04-14 LAB — BASIC METABOLIC PANEL
Anion gap: 6 (ref 5–15)
BUN: 13 mg/dL (ref 6–20)
CALCIUM: 8.7 mg/dL — AB (ref 8.9–10.3)
CO2: 25 mmol/L (ref 22–32)
CREATININE: 0.77 mg/dL (ref 0.44–1.00)
Chloride: 107 mmol/L (ref 98–111)
GFR calc Af Amer: >60 mL/min (ref >60)
GLUCOSE: 103 mg/dL — AB (ref 70–99)
POTASSIUM: 4 mmol/L (ref 3.5–5.1)
Sodium: 138 mmol/L (ref 135–145)

## 2018-04-14 LAB — I-STAT BETA HCG BLOOD, ED (MC, WL, AP ONLY): I-stat hCG, quantitative: <5 m[IU]/mL (ref <5)

## 2018-04-14 MED ORDER — METHYLPREDNISOLONE SODIUM SUCC 125 MG IJ SOLR
125.0000 mg | Freq: Once | INTRAMUSCULAR | Status: AC
  Administered 2018-04-14: 125 mg via INTRAVENOUS
  Filled 2018-04-14: qty 2

## 2018-04-14 MED ORDER — MAGNESIUM SULFATE IN D5W 1-5 GM/100ML-% IV SOLN
1.0000 g | Freq: Once | INTRAVENOUS | Status: AC
  Administered 2018-04-15: 1 g via INTRAVENOUS
  Filled 2018-04-14: qty 100

## 2018-04-14 MED ORDER — SODIUM CHLORIDE 0.9 % IV BOLUS
1000.0000 mL | Freq: Once | INTRAVENOUS | Status: AC
  Administered 2018-04-14: 1000 mL via INTRAVENOUS

## 2018-04-14 MED ORDER — METOCLOPRAMIDE HCL 5 MG/ML IJ SOLN
10.0000 mg | Freq: Once | INTRAMUSCULAR | Status: AC
  Administered 2018-04-14: 10 mg via INTRAVENOUS
  Filled 2018-04-14: qty 2

## 2018-04-14 MED ORDER — KETOROLAC TROMETHAMINE 30 MG/ML IJ SOLN
15.0000 mg | Freq: Once | INTRAMUSCULAR | Status: AC
  Administered 2018-04-14: 15 mg via INTRAVENOUS
  Filled 2018-04-14: qty 1

## 2018-04-14 NOTE — ED Provider Notes (Signed)
[facility]-EMERGENCY DEPT Provider Note   CSN: [id number] Arrival date & time: 04/14/18  1723    History   Chief Complaint Chief Complaint  Patient presents with  . Headache    HPI Ruhee A Christy is a 38 y.o. female.     HPI   Shadawn A Giovannini is a 38 y.o. female, with a history of HTN and asthma, presenting to the ED with headache for the past 2 weeks.  Pain is frontal, bilateral, throbbing, 6/10, nonradiating.  States this feels similar to previous headaches, however, her typical headaches resolve after several days. She has been under increased stress lately and has had decreased sleep.  She has tried ibuprofen and Tylenol without relief.  She has been compliant with her antihypertensive medications.  Denies fever/chills, recent illness, vision abnormality, neuro deficit, neck pain/stiffness, falls/trauma, numbness, weakness, congestion, rhinorrhea, or any other complaints.     Past Medical History:  Diagnosis Date  . Anemia   . Asthma   . Hypertension   . LUQ abdominal pain 10/16/2017  . Obesity     Patient Active Problem List   Diagnosis Date Noted  . Functional dyspepsia 02/20/2018  . IBS (irritable colon syndrome) 02/20/2018  . Iron deficiency anemia 08/18/2017  . Acute pain of left shoulder 08/18/2017  . Insomnia 07/29/2017  . Left lower quadrant abdominal pain of unknown etiology 07/13/2017  . OSA (obstructive sleep apnea) 06/05/2017  . Major depressive disorder 03/21/2017  . Migraine 10/24/2016  . Hematuria 09/24/2016  . Prediabetes 08/21/2016  . Allergic rhinitis 06/18/2016  . Left flank pain 06/05/2016  . PCOS (polycystic ovarian syndrome) 04/05/2016  . Essential hypertension, benign 12/01/2014  . Asthma, chronic 12/01/2014  . Microcytic anemia 12/01/2014  . Gastroesophageal reflux disease 12/01/2014  . Preventative health care 12/01/2014    Past Surgical History:  Procedure Laterality Date  . TOENAIL EXCISION       OB  History   No obstetric history on file.      Home Medications    Prior to Admission medications   Medication Sig Start Date End Date Taking? Authorizing Provider  albuterol (PROVENTIL HFA;VENTOLIN HFA) 108 (90 Base) MCG/ACT inhaler INHALE 1 TO 2 PUFFS INTO THE LUNGS EVERY 6 HOURS AS NEEDED FOR WHEEZING OR SHORTNESS OF BREATH Patient taking differently: Inhale 2 puffs into the lungs every 6 (six) hours as needed for wheezing or shortness of breath.  03/31/18  Yes Chundi, Vahini, MD  amLODipine (NORVASC) 10 MG tablet Take 10 mg by mouth daily.   Yes [provider]  fluticasone (FLONASE) 50 MCG/ACT nasal spray Place 1-2 sprays into both nostrils daily for 7 days. Patient taking differently: Place 1 spray into both nostrils daily as needed for allergies.  03/25/18 04/14/18 Yes Wieters, Hallie C, PA-C  naproxen (NAPROSYN) 500 MG tablet Take 1 tablet (500 mg total) by mouth 2 (two) times daily. 04/09/18  Yes Wieters, Hallie C, PA-C  ondansetron (ZOFRAN ODT) 4 MG disintegrating tablet Take 1 tablet (4 mg total) by mouth every 8 (eight) hours as needed for nausea or vomiting. 04/09/18  Yes Wieters, Hallie C, PA-C  pantoprazole (PROTONIX) 40 MG tablet Take 1 tablet (40 mg total) by mouth 2 (two) times daily. 02/20/18 05/21/18 Yes Nyra Market, MD  spironolactone (ALDACTONE) 50 MG tablet Take 50 mg by mouth daily.   Yes [provider]  SUMAtriptan (IMITREX) 50 MG tablet Take 1 tablet (50 mg total) by mouth once as needed for migraine. May repeat  in 2 hours if headache persists or recurs. 12/19/17 04/14/18 Yes Chundi, Vahini, MD  Metoclopramide HCl 10 MG TBDP Take 1 tablet (10 mg total) by mouth 4 (four) times daily -  before meals and at bedtime. Patient not taking: Reported on 04/14/2018 11/24/17   Iva Boop, MD  propranolol (INDERAL) 20 MG tablet Take 1 tablet (20 mg total) by mouth 4 (four) times daily. Patient not taking: Reported on 04/14/2018 12/19/17 01/18/18  Lorenso Courier, MD    spironolactone (ALDACTONE) 25 MG tablet Take 1 tablet (25 mg total) by mouth daily for 90 doses. Patient not taking: Reported on 04/14/2018 09/11/17 12/10/17  Lorenso Courier, MD    Family History Family History  Problem Relation Age of Onset  . Diabetes Mother   . Hypertension Mother   . Heart attack Mother        2016- 52 yrs  . Breast cancer Maternal Grandmother   . Colon cancer Neg Hx   . Stomach cancer Neg Hx   . Pancreatic cancer Neg Hx     Social History Social History   Tobacco Use  . Smoking status: Former Smoker    Packs/day: 0.10    Types: Cigarettes    Last attempt to quit: 04/06/2015    Years since quitting: 3.0  . Smokeless tobacco: Never Used  Substance Use Topics  . Alcohol use: No    Alcohol/week: 0.0 standard drinks  . Drug use: No     Allergies   Penicillins; Shellfish allergy; Sulfa antibiotics; Keflex [cephalexin]; Lisinopril; and Iodine   Review of Systems Review of Systems  Constitutional: Negative for chills, diaphoresis and fever.  HENT: Negative for congestion, rhinorrhea and sore throat.   Eyes: Negative for visual disturbance.  Respiratory: Negative for cough and shortness of breath.   Gastrointestinal: Negative for abdominal pain, diarrhea, nausea and vomiting.  Musculoskeletal: Negative for neck pain and neck stiffness.  Neurological: Positive for headaches. Negative for dizziness, seizures, syncope, facial asymmetry, weakness, light-headedness and numbness.  Psychiatric/Behavioral: Negative for confusion.  All other systems reviewed and are negative.    Physical Exam Updated Vital Signs BP (!) 157/103 (BP Location: Left Arm)   Pulse 66   Temp 98.4 F (36.9 C) (Oral)   Resp 20   Wt 135.2 kg   SpO2 98%   BMI 46.67 kg/m   Physical Exam Vitals signs and nursing note reviewed.  Constitutional:      General: She is not in acute distress.    Appearance: She is well-developed. She is not diaphoretic.  HENT:     Head:  Normocephalic and atraumatic.     Nose: Nose normal.     Mouth/Throat:     Mouth: Mucous membranes are moist.     Pharynx: Oropharynx is clear.  Eyes:     Extraocular Movements: Extraocular movements intact.     Conjunctiva/sclera: Conjunctivae normal.     Pupils: Pupils are equal, round, and reactive to light.  Neck:     Musculoskeletal: Normal range of motion and neck supple. No neck rigidity.  Cardiovascular:     Rate and Rhythm: Normal rate and regular rhythm.     Pulses: Normal pulses.     Heart sounds: Normal heart sounds.     Comments: Tactile temperature in the extremities appropriate and equal bilaterally. Pulmonary:     Effort: Pulmonary effort is normal. No respiratory distress.     Breath sounds: Normal breath sounds.  Abdominal:     Palpations: Abdomen is soft.  Tenderness: There is no abdominal tenderness. There is no guarding.  Musculoskeletal:     Right lower leg: No edema.     Left lower leg: No edema.  Lymphadenopathy:     Cervical: No cervical adenopathy.  Skin:    General: Skin is warm and dry.  Neurological:     Mental Status: She is alert and oriented to person, place, and time.     Comments: Sensation grossly intact to light touch in the extremities.  Grip strengths equal bilaterally.  Strength 5/5 in all extremities. No gait disturbance. Coordination intact. Cranial nerves III-XII grossly intact. No facial droop.   Psychiatric:        Mood and Affect: Mood and affect normal.        Speech: Speech normal.        Behavior: Behavior normal.      ED Treatments / Results  Labs (all labs ordered are listed, but only abnormal results are displayed) Labs Reviewed  BASIC METABOLIC PANEL - Abnormal; Notable for the following components:      Result Value   Glucose, Bld 103 (*)    Calcium 8.7 (*)    All other components within normal limits  CBC WITH DIFFERENTIAL/PLATELET - Abnormal; Notable for the following components:   Hemoglobin 11.8 (*)    MCH  23.5 (*)    MCHC 29.0 (*)    RDW 17.1 (*)    Lymphs Abs 5.0 (*)    All other components within normal limits  I-STAT BETA HCG BLOOD, ED ([facility], [facility], [facility] ONLY)    EKG None  Radiology Ct Head Wo Contrast  Result Date: 04/14/2018 CLINICAL DATA:  Headache for the past 2 weeks, causing nausea and visual disturbance. EXAM: CT HEAD WITHOUT CONTRAST TECHNIQUE: Contiguous axial images were obtained from the base of the skull through the vertex without intravenous contrast. COMPARISON:  04/04/2016. FINDINGS: Brain: Normal appearing cerebral hemispheres and posterior fossa structures. Normal size and position of the ventricles. No intracranial hemorrhage, mass lesion or CT evidence of acute infarction. Vascular: No hyperdense vessel or unexpected calcification. Skull: Normal. Negative for fracture or focal lesion. Sinuses/Orbits: Unremarkable. Other: None. IMPRESSION: Normal examination. Electronically Signed   By: Beckie Salts M.D.   On: 04/14/2018 21:15    Procedures Ultrasound ED Peripheral IV (Provider) Date/Time: 04/14/2018 11:04 PM Performed by: Anselm Pancoast, PA-C Authorized by: Anselm Pancoast, PA-C   Procedure details:    Indications: multiple failed IV attempts and poor IV access     Skin Prep: chlorhexidine gluconate     Location:  Left AC   Angiocath:  20 G   Bedside Ultrasound Guided: Yes     Images: not archived     Patient tolerated procedure without complications: Yes     Dressing applied: Yes   Comments:     Positive flash.  Advanced and flushed without pain, swelling, or other signs of infiltration.   (including critical care time)  Medications Ordered in ED Medications  sodium chloride 0.9 % bolus 1,000 mL (1,000 mLs Intravenous New Bag/Given 04/14/18 2245)  metoCLOPramide (REGLAN) injection 10 mg (10 mg Intravenous Given 04/14/18 2314)  methylPREDNISolone sodium succinate (SOLU-MEDROL) 125 mg/2 mL injection 125 mg (125 mg Intravenous Given 04/14/18 2313)  ketorolac (TORADOL) 30 MG/ML  injection 15 mg (15 mg Intravenous Given 04/14/18 2314)  magnesium sulfate IVPB 1 g 100 mL (1 g Intravenous New Bag/Given 04/15/18 0001)     Initial Impression / Assessment and Plan / ED Course  I have reviewed the triage vital signs and the nursing notes.  Pertinent labs & imaging results that were available during my care of the patient were reviewed by me and considered in my medical decision making (see chart for details).  Clinical Course as of Apr 14 32  Tue Apr 14, 2018  2340 Patient states she has had no improvement in her headache.    [SJ]  Wed Apr 15, 2018  0007 Patient's pain is now 4/10.   [SJ]  0020 Patient states her headache has resolved and she is ready to go home.   [SJ]    Clinical Course User Index [SJ] ,  C, PA-C       Patient presents with persistent headache.  No focal neuro deficits.  No red flag symptoms.  We were able to resolve patient's headache here in the ED. The patient was given instructions for home care as well as return precautions. Patient voices understanding of these instructions, accepts the plan, and is comfortable with discharge.  Vitals:   04/14/18 1743 04/14/18 1752 04/14/18 2119 04/15/18 0032  BP: (!) 157/103  (!) 149/87 135/84  Pulse: 66  68 72  Resp: 20  16 18   Temp: 98.4 F (36.9 C)     TempSrc: Oral     SpO2: 98%  98% 96%  Weight:  135.2 kg       Final Clinical Impressions(s) / ED Diagnoses   Final diagnoses:  Persistent headaches    ED Discharge Orders    None       Concepcion Living 04/15/18 0041    Mancel Bale, MD 04/15/18 1108

## 2018-04-14 NOTE — ED Triage Notes (Signed)
Pt reports that she has had a headache for 2 weeks. Pt reports that the headache is causing nausea. Pt seen at [facility] and told to come to ER if symptoms persist

## 2018-04-15 NOTE — Discharge Instructions (Signed)
Headache Your lab results showed no acute abnormalities.  For future headaches please try the following regimen: Antiinflammatory medications: Take 600 mg of ibuprofen every 6 hours or 440 mg (over the counter dose) to 500 mg (prescription dose) of naproxen every 12 hours.  Use this regimen until headache subsides for up to 3 days .  After this time, these medications may be used as needed for pain. Take these medications with food to avoid upset stomach. Choose only one of these medications, do not take them together. Tylenol: Should you continue to have additional pain while taking the ibuprofen or naproxen, you may add in tylenol as needed. Your daily total maximum amount of tylenol from all sources should be limited to 4000mg /day for persons without liver problems, or 2000mg /day for those with liver problems.  Hydration: Have a goal of about a half liter of water every couple hours to stay well hydrated.   Sleep: Please be sure to get plenty of sleep with a goal of 8 hours per night. Having a regular bed time and bedtime routine can help with this.  Screens: Reduce the amount of time you are in front of screens.  Take about a 5-10-minute break every hour or every couple hours to give your eyes rest.  Do not use screens in dark rooms.  Glasses with a blue light filter may also help reduce eye fatigue.  Stress: Take steps to reduce stress as much as possible.   Follow up: Follow-up with your primary care provider on this issue.  May also need to follow-up with the neurologist for increased frequency of headaches. Your blood pressure was noted to be a little higher than normal today.  Please follow-up with a primary care provider on this matter.

## 2018-04-17 ENCOUNTER — Ambulatory Visit: Payer: BLUE CROSS/BLUE SHIELD | Admitting: Internal Medicine

## 2018-04-17 ENCOUNTER — Other Ambulatory Visit: Payer: Self-pay

## 2018-04-17 DIAGNOSIS — I10 Essential (primary) hypertension: Secondary | ICD-10-CM | POA: Diagnosis not present

## 2018-04-17 DIAGNOSIS — G43119 Migraine with aura, intractable, without status migrainosus: Secondary | ICD-10-CM

## 2018-04-17 DIAGNOSIS — Z79899 Other long term (current) drug therapy: Secondary | ICD-10-CM

## 2018-04-17 DIAGNOSIS — G43909 Migraine, unspecified, not intractable, without status migrainosus: Secondary | ICD-10-CM

## 2018-04-17 MED ORDER — SPIRONOLACTONE 25 MG PO TABS: 25.0000 mg | ORAL_TABLET | Freq: Every day | ORAL | 0 refills | Status: DC

## 2018-04-17 MED ORDER — AMLODIPINE BESYLATE 10 MG PO TABS: 10.0000 mg | ORAL_TABLET | Freq: Every day | ORAL | 0 refills | Status: DC

## 2018-04-17 NOTE — Assessment & Plan Note (Signed)
History of present illness: Patient states that she has been out of her medication for a week.  She states she has switched pharmacies and therefore cannot get her medications.  Assessment: Essential hypertension Patient states she is prescribed amlodipine and spironolactone.   Plan -Refill spironolactone and amlodipine to new pharmacy -Return in 2 weeks for blood pressure recheck and bmet

## 2018-04-17 NOTE — Patient Instructions (Signed)
Ms. Sako,  It was a pleasure meeting you today. Please follow up in 2 weeks to reassess her blood pressure and obtain blood work.

## 2018-04-17 NOTE — Assessment & Plan Note (Signed)
History of present illness: Patient was recently seen in the ED on 3/3 for migraine headache. She has a history of migraine headaches and has been prescribed sumatriptan as well as propanolol for prophylaxis in the past. She states that she stopped taking her blood pressure medications about a week ago around the time she started developing headaches.  She states she currently has a headache that is throbbing,one-sided but has improved over the past week. She has tried sumatriptan with little benefit.  Assessment: Headache No red flag symptoms, neuro exam without cranial nerve deficits.  I believe that her elevated blood pressure may be prolonging her headache symptoms and this needs to be lowered before adding medications to her regimen for headache management.    Plan -Tylenol and ibuprofen -refill blood pressure medications -Follow-up in 2 weeks to reassess headaches and if continued despite lowering blood pressure then would refer to headache clinic

## 2018-04-17 NOTE — Progress Notes (Signed)
   CC: follow up on essential hypertension and chronic headache  HPI:  Victoria Booth is a 38 y.o. female with history noted below that presents to the acute care clinic for follow-up on essential hypertension and chronic headache. Please see problem based charting for the status of patient's chronic medical conditions.  Past Medical History:  Diagnosis Date  . Anemia   . Asthma   . Hypertension   . LUQ abdominal pain 10/16/2017  . Obesity     Review of Systems:  Review of Systems  Constitutional: Negative for malaise/fatigue.  Cardiovascular: Negative for leg swelling.  Neurological: Positive for headaches. Negative for dizziness, focal weakness and weakness.     Physical Exam:  Vitals:   04/17/18 1334  BP: (!) 151/91  Pulse: 64  Temp: 98.2 F (36.8 C)  TempSrc: Oral  SpO2: 98%  Weight: (!) 307 lb (139.3 kg)  Height: 5\' 7"  (1.702 m)   Physical Exam  Constitutional: She is well-developed, well-nourished, and in no distress.  Cardiovascular: Normal rate, regular rhythm and normal heart sounds. Exam reveals no gallop and no friction rub.  No murmur heard. Pulmonary/Chest: Effort normal and breath sounds normal. No respiratory distress. She has no wheezes. She has no rales.  Musculoskeletal:        General: No edema.  Neurological: No cranial nerve deficit.  Finger to nose test intact  Skin: Skin is warm and dry.     Assessment & Plan:   See encounters tab for problem based medical decision making.   Patient discussed with Dr. Heide Spark

## 2018-04-20 NOTE — Progress Notes (Signed)
Internal Medicine Clinic Attending  Case discussed with Dr. Hoffman at the time of the visit.  We reviewed the resident's history and exam and pertinent patient test results.  I agree with the assessment, diagnosis, and plan of care documented in the resident's note.  

## 2018-04-24 ENCOUNTER — Encounter: Payer: Self-pay | Admitting: Internal Medicine

## 2018-04-25 ENCOUNTER — Ambulatory Visit (INDEPENDENT_AMBULATORY_CARE_PROVIDER_SITE_OTHER): Payer: BLUE CROSS/BLUE SHIELD

## 2018-04-25 ENCOUNTER — Encounter (HOSPITAL_COMMUNITY): Payer: Self-pay

## 2018-04-25 ENCOUNTER — Ambulatory Visit (HOSPITAL_COMMUNITY)
Admission: EM | Admit: 2018-04-25 | Discharge: 2018-04-25 | Disposition: A | Discharge status: Home / Self Care | Payer: BLUE CROSS/BLUE SHIELD | Attending: Emergency Medicine | Admitting: Emergency Medicine

## 2018-04-25 DIAGNOSIS — J4521 Mild intermittent asthma with (acute) exacerbation: Secondary | ICD-10-CM | POA: Diagnosis not present

## 2018-04-25 DIAGNOSIS — R062 Wheezing: Secondary | ICD-10-CM | POA: Diagnosis not present

## 2018-04-25 DIAGNOSIS — R0602 Shortness of breath: Secondary | ICD-10-CM | POA: Diagnosis not present

## 2018-04-25 DIAGNOSIS — J4520 Mild intermittent asthma, uncomplicated: Secondary | ICD-10-CM | POA: Diagnosis not present

## 2018-04-25 DIAGNOSIS — J069 Acute upper respiratory infection, unspecified: Secondary | ICD-10-CM | POA: Diagnosis not present

## 2018-04-25 MED ORDER — IPRATROPIUM-ALBUTEROL 0.5-2.5 (3) MG/3ML IN SOLN
3.0000 mL | Freq: Once | RESPIRATORY_TRACT | Status: AC
  Administered 2018-04-25: 3 mL via RESPIRATORY_TRACT

## 2018-04-25 MED ORDER — ALBUTEROL SULFATE (2.5 MG/3ML) 0.083% IN NEBU: 5.0000 mg | INHALATION_SOLUTION | Freq: Four times a day (QID) | RESPIRATORY_TRACT | 12 refills | Status: DC | PRN

## 2018-04-25 MED ORDER — METHYLPREDNISOLONE SODIUM SUCC 125 MG IJ SOLR
INTRAMUSCULAR | Status: AC
  Filled 2018-04-25: qty 2

## 2018-04-25 MED ORDER — METHYLPREDNISOLONE SODIUM SUCC 125 MG IJ SOLR
125.0000 mg | Freq: Once | INTRAMUSCULAR | Status: AC
  Administered 2018-04-25: 125 mg via INTRAMUSCULAR

## 2018-04-25 MED ORDER — IPRATROPIUM-ALBUTEROL 0.5-2.5 (3) MG/3ML IN SOLN
RESPIRATORY_TRACT | Status: AC
  Filled 2018-04-25: qty 3

## 2018-04-25 MED ORDER — PREDNISONE 50 MG PO TABS: 50.0000 mg | ORAL_TABLET | Freq: Every day | ORAL | 0 refills | Status: AC

## 2018-04-25 NOTE — ED Provider Notes (Signed)
[facility]    CSN: [id number] Arrival date & time: 04/25/18  1637     History   Chief Complaint Chief Complaint  Patient presents with  . Sore Throat  . Cough  . Nasal Congestion    HPI Malerie A Follman is a 38 y.o. female.   Carisma presents with complaints of post nasal drip, headache, runny nose, cough and wheezing. States symptoms started approximately 3/9. Was treated for sinus infection approximately 3 weeks ago per patient and had felt better for a few days. Her throat hurts. Today her wheezing has worsened. History of asthma, has been using an inhaler which has minimally helped. Feels shortness of breath . Cough is productive. No known ill contacts. She did get her flu vaccine this year. No skin rash. No gi/gu complaints. Hx of asthma, htn, anemia, PCOS, migraine, GERD.    ROS per HPI, negative if not otherwise mentioned.      Past Medical History:  Diagnosis Date  . Anemia   . Asthma   . Hypertension   . LUQ abdominal pain 10/16/2017  . Obesity     Patient Active Problem List   Diagnosis Date Noted  . Functional dyspepsia 02/20/2018  . IBS (irritable colon syndrome) 02/20/2018  . Iron deficiency anemia 08/18/2017  . Acute pain of left shoulder 08/18/2017  . Insomnia 07/29/2017  . Left lower quadrant abdominal pain of unknown etiology 07/13/2017  . OSA (obstructive sleep apnea) 06/05/2017  . Major depressive disorder 03/21/2017  . Migraine 10/24/2016  . Hematuria 09/24/2016  . Prediabetes 08/21/2016  . Allergic rhinitis 06/18/2016  . Left flank pain 06/05/2016  . PCOS (polycystic ovarian syndrome) 04/05/2016  . Essential hypertension, benign 12/01/2014  . Asthma, chronic 12/01/2014  . Microcytic anemia 12/01/2014  . Gastroesophageal reflux disease 12/01/2014  . Preventative health care 12/01/2014    Past Surgical History:  Procedure Laterality Date  . TOENAIL EXCISION      OB History   No obstetric history on file.      Home  Medications    Prior to Admission medications   Medication Sig Start Date End Date Taking? Authorizing Provider  albuterol (PROVENTIL HFA;VENTOLIN HFA) 108 (90 Base) MCG/ACT inhaler INHALE 1 TO 2 PUFFS INTO THE LUNGS EVERY 6 HOURS AS NEEDED FOR WHEEZING OR SHORTNESS OF BREATH Patient taking differently: Inhale 2 puffs into the lungs every 6 (six) hours as needed for wheezing or shortness of breath.  03/31/18   Chundi, Sherlyn Lees, MD  albuterol (PROVENTIL) (2.5 MG/3ML) 0.083% nebulizer solution Take 6 mLs (5 mg total) by nebulization every 6 (six) hours as needed for wheezing or shortness of breath. 04/25/18   Georgetta Haber, NP  amLODipine (NORVASC) 10 MG tablet Take 1 tablet (10 mg total) by mouth daily. 04/17/18   Geralyn Corwin Ratliff, DO  fluticasone (FLONASE) 50 MCG/ACT nasal spray Place 1-2 sprays into both nostrils daily for 7 days. Patient taking differently: Place 1 spray into both nostrils daily as needed for allergies.  03/25/18 04/14/18  Wieters, Hallie C, PA-C  pantoprazole (PROTONIX) 40 MG tablet Take 1 tablet (40 mg total) by mouth 2 (two) times daily. 02/20/18 05/21/18  Nyra Market, MD  predniSONE (DELTASONE) 50 MG tablet Take 1 tablet (50 mg total) by mouth daily with breakfast for 5 days. 04/25/18 04/30/18  Georgetta Haber, NP  spironolactone (ALDACTONE) 25 MG tablet Take 1 tablet (25 mg total) by mouth daily for 90 doses. 04/17/18 07/16/18  Camelia Phenes, DO  SUMAtriptan (  IMITREX) 50 MG tablet Take 1 tablet (50 mg total) by mouth once as needed for migraine. May repeat in 2 hours if headache persists or recurs. 12/19/17 04/14/18  Lorenso Courier, MD    Family History Family History  Problem Relation Age of Onset  . Diabetes Mother   . Hypertension Mother   . Heart attack Mother        2016- 52 yrs  . Breast cancer Maternal Grandmother   . Colon cancer Neg Hx   . Stomach cancer Neg Hx   . Pancreatic cancer Neg Hx     Social History Social History   Tobacco Use  .  Smoking status: Former Smoker    Packs/day: 0.10    Types: Cigarettes    Last attempt to quit: 04/06/2015    Years since quitting: 3.0  . Smokeless tobacco: Never Used  Substance Use Topics  . Alcohol use: No    Alcohol/week: 0.0 standard drinks  . Drug use: No     Allergies   Penicillins; Shellfish allergy; Sulfa antibiotics; Keflex [cephalexin]; Lisinopril; and Iodine   Review of Systems Review of Systems   Physical Exam Triage Vital Signs ED Triage Vitals  Enc Vitals Group     BP 04/25/18 1747 133/71     Pulse Rate 04/25/18 1747 (!) 102     Resp 04/25/18 1747 18     Temp 04/25/18 1747 98.1 F (36.7 C)     Temp Source 04/25/18 1747 Oral     SpO2 04/25/18 1747 99 %     Weight --      Height --      Head Circumference --      Peak Flow --      Pain Score 04/25/18 1748 7     Pain Loc --      Pain Edu? --      Excl. in GC? --    No data found.  Updated Vital Signs BP 133/71 (BP Location: Right Arm)   Pulse (!) 102   Temp 98.1 F (36.7 C) (Oral)   Resp 18   SpO2 99%    Physical Exam Constitutional:      General: She is not in acute distress.    Appearance: She is well-developed.  HENT:     Head: Normocephalic and atraumatic.     Right Ear: Tympanic membrane, ear canal and external ear normal.     Left Ear: Tympanic membrane, ear canal and external ear normal.     Nose: Nose normal.     Mouth/Throat:     Pharynx: Uvula midline. Posterior oropharyngeal erythema present.     Tonsils: No tonsillar exudate.  Eyes:     Conjunctiva/sclera: Conjunctivae normal.     Pupils: Pupils are equal, round, and reactive to light.  Cardiovascular:     Rate and Rhythm: Regular rhythm. Tachycardia present.     Heart sounds: Normal heart sounds.  Pulmonary:     Effort: Pulmonary effort is normal.     Breath sounds: Wheezing present.     Comments: Improvement of wheezing s/p nebulizer in clinic; strong cough noted; initially in between conversation; improved with  nebulizer Skin:    General: Skin is warm and dry.  Neurological:     Mental Status: She is alert and oriented to person, place, and time.      UC Treatments / Results  Labs (all labs ordered are listed, but only abnormal results are displayed) Labs Reviewed - No data to display  EKG None  Radiology Dg Chest 2 View  Result Date: 04/25/2018 CLINICAL DATA:  Shortness of breath and wheezing EXAM: CHEST - 2 VIEW COMPARISON:  October 12, 2017 FINDINGS: The lungs are clear. The heart size and pulmonary vascularity are normal. No adenopathy. No bone lesions. IMPRESSION: No edema or consolidation. Electronically Signed   By: Bretta Bang III M.D.   On: 04/25/2018 18:20    Procedures Procedures (including critical care time)  Medications Ordered in UC Medications  methylPREDNISolone sodium succinate (SOLU-MEDROL) 125 mg/2 mL injection 125 mg (125 mg Intramuscular Given 04/25/18 1830)  ipratropium-albuterol (DUONEB) 0.5-2.5 (3) MG/3ML nebulizer solution 3 mL (3 mLs Nebulization Given 04/25/18 1830)    Initial Impression / Assessment and Plan / UC Course  I have reviewed the triage vital signs and the nursing notes.  Pertinent labs & imaging results that were available during my care of the patient were reviewed by me and considered in my medical decision making (see chart for details).     Afebrile. No hypoxia. Improved lungs with nebulizer. Wheezing in presence of asthma flair. Consistent with viral illness with asthma exacerbation. Solumedrol in clinic with prednisone course prescribed. Neb provided. Return precautions provided. If symptoms worsen or do not improve in the next week to return to be seen or to follow up with PCP.  Patient verbalized understanding and agreeable to plan.  Ambulatory out of clinic without difficulty.   Final Clinical Impressions(s) / UC Diagnoses   Final diagnoses:  Viral upper respiratory tract infection  Mild intermittent asthma with exacerbation      Discharge Instructions     Use of nebulizer as needed for wheezing or shortness of breath .  Continue with use of over the counter treatments as needed for symptoms.  Push fluids to ensure adequate hydration and keep secretions thin.  Tylenol and/or ibuprofen as needed for pain or fevers.  5 days of prednisone.  If develop increased fevers, chills, chest pain , shortness of breath  or otherwise worsening please go to the ER.  If symptoms worsen or do not improve in the next week to return to be seen or to follow up with your PCP.     ED Prescriptions    Medication Sig Dispense Auth. Provider   albuterol (PROVENTIL) (2.5 MG/3ML) 0.083% nebulizer solution Take 6 mLs (5 mg total) by nebulization every 6 (six) hours as needed for wheezing or shortness of breath. 75 mL Linus Mako B, NP   predniSONE (DELTASONE) 50 MG tablet Take 1 tablet (50 mg total) by mouth daily with breakfast for 5 days. 5 tablet Georgetta Haber, NP     Controlled Substance Prescriptions [place] Controlled Substance Registry consulted? Not Applicable   Georgetta Haber, NP 04/25/18 1925

## 2018-04-25 NOTE — ED Triage Notes (Signed)
Pt present sore throat, cough and nasal congestion. Pt was recently seen here 3 weeks ago and states her symptoms has progressively worst.

## 2018-04-25 NOTE — Discharge Instructions (Signed)
Use of nebulizer as needed for wheezing or shortness of breath .  Continue with use of over the counter treatments as needed for symptoms.  Push fluids to ensure adequate hydration and keep secretions thin.  Tylenol and/or ibuprofen as needed for pain or fevers.  5 days of prednisone.  If develop increased fevers, chills, chest pain , shortness of breath  or otherwise worsening please go to the ER.  If symptoms worsen or do not improve in the next week to return to be seen or to follow up with your PCP.

## 2018-04-30 ENCOUNTER — Ambulatory Visit: Payer: BLUE CROSS/BLUE SHIELD

## 2018-05-16 ENCOUNTER — Other Ambulatory Visit: Payer: Self-pay | Admitting: Internal Medicine

## 2018-05-16 DIAGNOSIS — K219 Gastro-esophageal reflux disease without esophagitis: Secondary | ICD-10-CM

## 2018-05-20 ENCOUNTER — Other Ambulatory Visit: Payer: Self-pay

## 2018-05-20 ENCOUNTER — Ambulatory Visit (INDEPENDENT_AMBULATORY_CARE_PROVIDER_SITE_OTHER): Payer: BLUE CROSS/BLUE SHIELD | Admitting: Internal Medicine

## 2018-05-20 DIAGNOSIS — J302 Other seasonal allergic rhinitis: Secondary | ICD-10-CM

## 2018-05-20 DIAGNOSIS — I10 Essential (primary) hypertension: Secondary | ICD-10-CM | POA: Diagnosis not present

## 2018-05-20 MED ORDER — FLUTICASONE PROPIONATE 50 MCG/ACT NA SUSP: 2.0000 | Freq: Every day | NASAL | 0 refills | Status: DC

## 2018-05-20 MED ORDER — DM-GG & DM-APAP-CPM 10-20 &15-200-2 MG PO MISC: 1.0000 | Freq: Two times a day (BID) | ORAL | 0 refills | Status: DC | PRN

## 2018-05-20 MED ORDER — CETIRIZINE HCL 10 MG PO CAPS: 1.0000 | ORAL_CAPSULE | Freq: Every day | ORAL | 0 refills | Status: DC

## 2018-05-20 NOTE — Progress Notes (Signed)
   [facility] Telephone Encounter  Reason for call:   This telephone encounter was created for [name] on 05/20/2018 for the following purpose/cc HTN and allergic rhinitis .   Pertinent Data:   Seasonal allergies, HTN, PCOS  ROS: Pulmonary: pt denies increased work of breathing, shortness of breath,  Cardiac: pt denies palpitations, chest pain,   Abdominal: pt denies abdominal pain, nausea, vomiting, or diarrhea   Assessment / Plan / Recommendations:   A: Allergic Rhinitis: itchy watery eyes and throat, has episodic "sinus headache" and congestion.  Started about one week ago.  Using flonase started yesterday, did notice some improvement.  Congestion mainly at night. Took sudafed the last few days which raised her blood pressure. She has had no fevers or chills, or fatigue. No SOB or chest pain, no sick contacts.  No recent travel.     P: refilled flonase and encouraged continuing to use, prescribed zyrtec, coricidin as a decongestant  HTN: takes her bp daily has been 131/80 usually.  Did have higher bp when starting sudafed around 148/90. Overall is much improved since starting the amlodipine.  She is on spiro chronically for PCOS/HTN.    P: continue current regimen, defer labs until next in person visit, she is on spiro chronically and it is a lower dosage.  As always, pt is advised that if symptoms worsen or new symptoms arise, they should go to an urgent care facility or to to ER for further evaluation.   Consent and Medical Decision Making:   Patient discussed with Dr. Rogelia Boga  This is a telephone encounter between [facility] and [facility] on 05/20/2018 for Allergic Rhinitis and HTN. The visit was conducted with the patient located at home and [facility] Mule at [facility]. The patient's identity was confirmed using their DOB and current address. The patient has consented to being evaluated through a telephone encounter and understands  the associated risks (an examination cannot be done and the patient may need to come in for an appointment) / benefits (allows the patient to remain at home, decreasing exposure to coronavirus). I personally spent 18 minutes on medical discussion.

## 2018-05-20 NOTE — Assessment & Plan Note (Signed)
HTN: takes her bp daily has been 131/80 usually.  Did have higher bp when starting sudafed around 148/90. Overall is much improved since starting the amlodipine.  She is on spiro chronically for PCOS/HTN.    P: continue current regimen, defer labs until next in person visit, she is on spiro chronically and it is a lower dosage.

## 2018-05-20 NOTE — Assessment & Plan Note (Addendum)
A: Allergic Rhinitis: itchy watery eyes and throat, has episodic "sinus headache" and congestion.  Started about one week ago.  Using flonase started yesterday, did notice some improvement.  Congestion mainly at night. Took sudafed the last few days which raised her blood pressure. She has had no fevers or chills, or fatigue. No SOB or chest pain, no sick contacts.  No recent travel.     P: refilled flonase and encouraged continuing to use, prescribed zyrtec, coricidin as a decongestant As always, pt is advised that if symptoms worsen or new symptoms arise, they should go to an urgent care facility or to to ER for further evaluation.

## 2018-05-28 NOTE — Progress Notes (Signed)
Internal Medicine Clinic Attending  Case discussed with Dr. Winfrey  at the time of the visit.  We reviewed the resident's history and exam and pertinent patient test results.  I agree with the assessment, diagnosis, and plan of care documented in the resident's note.  

## 2018-06-25 ENCOUNTER — Telehealth: Payer: Self-pay | Admitting: *Deleted

## 2018-06-25 NOTE — Telephone Encounter (Signed)
Pt calls and states she has not been feeling herself recently has started checking bp and states it is very high, states she has been having h/a, tired, weak, nausea. 156/106 bp dyastolic never under 100. She is advised to go to urg care today and f/u with her appt 5/22 w/ dr Delma Officer unless urg care advises before then and to call for earlier appt. She is agreeable.

## 2018-06-26 NOTE — Telephone Encounter (Signed)
Thank you for letting me know

## 2018-06-30 ENCOUNTER — Encounter: Payer: BLUE CROSS/BLUE SHIELD | Admitting: Internal Medicine

## 2018-06-30 NOTE — Progress Notes (Deleted)
   CC: ***  HPI:  Victoria Booth is a 38 y.o.   Past Medical History:  Diagnosis Date  . Anemia   . Asthma   . Hypertension   . LUQ abdominal pain 10/16/2017  . Obesity    Review of Systems:  ***  Physical Exam:  There were no vitals filed for this visit. ***  Assessment & Plan:   See Encounters Tab for problem based charting.  Patient {GC/GE:[id number]::"discussed with","seen with"} Dr. {NAMES:[id number]::"Butcher","Granfortuna","E. Hoffman","Klima","Mullen","Narendra","Raines","Vincent"}

## 2018-07-01 ENCOUNTER — Encounter: Payer: BLUE CROSS/BLUE SHIELD | Admitting: Internal Medicine

## 2018-07-03 ENCOUNTER — Encounter: Payer: BLUE CROSS/BLUE SHIELD | Admitting: Internal Medicine

## 2018-07-07 ENCOUNTER — Ambulatory Visit (HOSPITAL_COMMUNITY)
Admission: EM | Admit: 2018-07-07 | Discharge: 2018-07-07 | Disposition: A | Discharge status: Home / Self Care | Payer: BLUE CROSS/BLUE SHIELD | Attending: Family Medicine | Admitting: Family Medicine

## 2018-07-07 ENCOUNTER — Encounter (HOSPITAL_COMMUNITY): Payer: Self-pay

## 2018-07-07 ENCOUNTER — Other Ambulatory Visit: Payer: Self-pay

## 2018-07-07 ENCOUNTER — Ambulatory Visit (INDEPENDENT_AMBULATORY_CARE_PROVIDER_SITE_OTHER): Payer: BLUE CROSS/BLUE SHIELD

## 2018-07-07 DIAGNOSIS — N200 Calculus of kidney: Secondary | ICD-10-CM | POA: Diagnosis not present

## 2018-07-07 DIAGNOSIS — R1012 Left upper quadrant pain: Secondary | ICD-10-CM

## 2018-07-07 DIAGNOSIS — R1030 Lower abdominal pain, unspecified: Secondary | ICD-10-CM

## 2018-07-07 LAB — POCT URINALYSIS DIP (DEVICE)
Bilirubin Urine: NEGATIVE
Glucose, UA: NEGATIVE mg/dL
Ketones, ur: NEGATIVE mg/dL
Leukocytes,Ua: NEGATIVE
Nitrite: NEGATIVE
Protein, ur: NEGATIVE mg/dL
Specific Gravity, Urine: 1.025 (ref 1.005–1.030)
Urobilinogen, UA: 0.2 mg/dL (ref 0.0–1.0)
pH: 6 (ref 5.0–8.0)

## 2018-07-07 LAB — POCT PREGNANCY, URINE: Preg Test, Ur: NEGATIVE

## 2018-07-07 MED ORDER — TRAMADOL HCL 50 MG PO TABS: 50.0000 mg | ORAL_TABLET | Freq: Four times a day (QID) | ORAL | 0 refills | Status: DC | PRN

## 2018-07-07 MED ORDER — ONDANSETRON 4 MG PO TBDP: 4.0000 mg | ORAL_TABLET | Freq: Three times a day (TID) | ORAL | 0 refills | Status: DC | PRN

## 2018-07-07 NOTE — ED Provider Notes (Signed)
[facility]    CSN: [id number] Arrival date & time: 07/07/18  1028     History   Chief Complaint Chief Complaint  Patient presents with  . Abdominal Pain    HPI Victoria Booth is a 38 y.o. female.   Patient is a 38 year old female with a past medical history of anemia, asthma, hypertension, left upper quadrant abdominal pain, obesity, GERD, left flank pain.  She presents today with approximately 3 days of left flank and left upper quadrant pain.  The pain is worse with deep breathing.  She has had some associated nausea, vomiting.  She is also had decreased appetite and nausea since Saturday.  She has been able to sip fluids.  Last bowel movement was this morning and she reports it to be normal.  No dysuria, hematuria or urinary frequency.  No vaginal discharge or bleeding.  No cough, congestion or fevers.  No recent sick contacts or recent traveling. Hx of kidney stones  ROS per HPI      Past Medical History:  Diagnosis Date  . Anemia   . Asthma   . Hypertension   . LUQ abdominal pain 10/16/2017  . Obesity     Patient Active Problem List   Diagnosis Date Noted  . Functional dyspepsia 02/20/2018  . IBS (irritable colon syndrome) 02/20/2018  . Iron deficiency anemia 08/18/2017  . Acute pain of left shoulder 08/18/2017  . Insomnia 07/29/2017  . Left lower quadrant abdominal pain of unknown etiology 07/13/2017  . OSA (obstructive sleep apnea) 06/05/2017  . Major depressive disorder 03/21/2017  . Migraine 10/24/2016  . Hematuria 09/24/2016  . Prediabetes 08/21/2016  . Allergic rhinitis 06/18/2016  . Left flank pain 06/05/2016  . PCOS (polycystic ovarian syndrome) 04/05/2016  . Essential hypertension, benign 12/01/2014  . Asthma, chronic 12/01/2014  . Microcytic anemia 12/01/2014  . Gastroesophageal reflux disease 12/01/2014  . Preventative health care 12/01/2014    Past Surgical History:  Procedure Laterality Date  . TOENAIL EXCISION      OB  History   No obstetric history on file.      Home Medications    Prior to Admission medications   Medication Sig Start Date End Date Taking? Authorizing Provider  albuterol (PROVENTIL HFA;VENTOLIN HFA) 108 (90 Base) MCG/ACT inhaler INHALE 1 TO 2 PUFFS INTO THE LUNGS EVERY 6 HOURS AS NEEDED FOR WHEEZING OR SHORTNESS OF BREATH Patient taking differently: Inhale 2 puffs into the lungs every 6 (six) hours as needed for wheezing or shortness of breath.  03/31/18   Chundi, Sherlyn Lees, MD  albuterol (PROVENTIL) (2.5 MG/3ML) 0.083% nebulizer solution Take 6 mLs (5 mg total) by nebulization every 6 (six) hours as needed for wheezing or shortness of breath. 04/25/18   Georgetta Haber, NP  amLODipine (NORVASC) 10 MG tablet Take 1 tablet (10 mg total) by mouth daily. 04/17/18   Camelia Phenes, DO  Cetirizine HCl (ZYRTEC ALLERGY) 10 MG CAPS Take 1 capsule (10 mg total) by mouth daily. 05/20/18   Angelita Ingles, MD  DM-GG & DM-APAP-CPM (CORICIDIN HBP DAY/NIGHT COLD) 10-20 &15-200-2 MG MISC Take 1-2 capsules by mouth 2 (two) times daily as needed. 05/20/18   Angelita Ingles, MD  fluticasone (FLONASE) 50 MCG/ACT nasal spray Place 2 sprays into both nostrils daily for 7 days. 05/20/18 05/27/18  Angelita Ingles, MD  ondansetron (ZOFRAN ODT) 4 MG disintegrating tablet Take 1 tablet (4 mg total) by mouth every 8 (eight) hours as needed for nausea or  vomiting. 07/07/18   Dahlia Byes A, NP  pantoprazole (PROTONIX) 40 MG tablet TAKE ONE TABLET BY MOUTH TWICE DAILY 05/18/18   Chundi, Sherlyn Lees, MD  spironolactone (ALDACTONE) 25 MG tablet Take 1 tablet (25 mg total) by mouth daily for 90 doses. 04/17/18 07/16/18  Camelia Phenes, DO  SUMAtriptan (IMITREX) 50 MG tablet Take 1 tablet (50 mg total) by mouth once as needed for migraine. May repeat in 2 hours if headache persists or recurs. 12/19/17 04/14/18  Lorenso Courier, MD  traMADol (ULTRAM) 50 MG tablet Take 1 tablet (50 mg total) by mouth every 6 (six) hours as  needed. 07/07/18   Janace Aris, NP    Family History Family History  Problem Relation Age of Onset  . Diabetes Mother   . Hypertension Mother   . Heart attack Mother        2016- 52 yrs  . Breast cancer Maternal Grandmother   . Hypertension Father   . Colon cancer Neg Hx   . Stomach cancer Neg Hx   . Pancreatic cancer Neg Hx     Social History Social History   Tobacco Use  . Smoking status: Former Smoker    Packs/day: 0.10    Types: Cigarettes    Last attempt to quit: 04/06/2015    Years since quitting: 3.2  . Smokeless tobacco: Never Used  Substance Use Topics  . Alcohol use: No    Alcohol/week: 0.0 standard drinks  . Drug use: No     Allergies   Penicillins; Shellfish allergy; Sulfa antibiotics; Keflex [cephalexin]; Lisinopril; and Iodine   Review of Systems Review of Systems   Physical Exam Triage Vital Signs ED Triage Vitals  Enc Vitals Group     BP 07/07/18 1058 (!) 154/110     Pulse Rate 07/07/18 1058 74     Resp 07/07/18 1058 18     Temp 07/07/18 1102 (!) 97.5 F (36.4 C)     Temp Source 07/07/18 1102 Oral     SpO2 07/07/18 1058 98 %     Weight --      Height --      Head Circumference --      Peak Flow --      Pain Score 07/07/18 1056 8     Pain Loc --      Pain Edu? --      Excl. in GC? --    No data found.  Updated Vital Signs BP (!) 154/110   Pulse 74   Temp (!) 97.5 F (36.4 C) (Oral)   Resp 18   LMP 06/19/2018   SpO2 98%   Visual Acuity Right Eye Distance:   Left Eye Distance:   Bilateral Distance:    Right Eye Near:   Left Eye Near:    Bilateral Near:     Physical Exam Vitals signs and nursing note reviewed.  Constitutional:      General: She is not in acute distress.    Appearance: She is well-developed. She is not ill-appearing, toxic-appearing or diaphoretic.  HENT:     Head: Normocephalic and atraumatic.  Cardiovascular:     Rate and Rhythm: Normal rate and regular rhythm.  Pulmonary:     Effort: Pulmonary  effort is normal.  Abdominal:     General: Bowel sounds are normal.     Palpations: Abdomen is soft.     Tenderness: There is abdominal tenderness in the left upper quadrant. There is left CVA tenderness.  Hernia: No hernia is present.  Skin:    General: Skin is warm and dry.  Neurological:     General: No focal deficit present.     Mental Status: She is alert.      UC Treatments / Results  Labs (all labs ordered are listed, but only abnormal results are displayed) Labs Reviewed  POCT URINALYSIS DIP (DEVICE) - Abnormal; Notable for the following components:      Result Value   Hgb urine dipstick MODERATE (*)    All other components within normal limits  POC URINE PREG, ED  POCT PREGNANCY, URINE    EKG None  Radiology Dg Abd Acute W/chest  Result Date: 07/07/2018 CLINICAL DATA:  38 year old female with a history of left upper quadrant and lower abdominal pain EXAM: DG ABDOMEN ACUTE W/ 1V CHEST COMPARISON:  04/25/2018, CT 10/28/2017 FINDINGS: Chest: Cardiomediastinal silhouette within normal limits. No evidence of central vascular congestion. No interlobular septal thickening. No pneumothorax or pleural effusion. No confluent airspace disease. Abdomen: Gas within stomach small bowel colon. No air-fluid levels. No abnormal distension. No unexpected radiopaque foreign body. Rounded calcific density projects over the left renal silhouette. No unexpected soft tissue density. No displaced fracture IMPRESSION: Chest: Negative for acute cardiopulmonary disease. Abdomen: Nonobstructive bowel gas pattern. Plain film demonstration of left nephrolithiasis. Electronically Signed   By: Gilmer Mor D.O.   On: 07/07/2018 12:01    Procedures Procedures (including critical care time)  Medications Ordered in UC Medications - No data to display  Initial Impression / Assessment and Plan / UC Course  I have reviewed the triage vital signs and the nursing notes.  Pertinent labs & imaging  results that were available during my care of the patient were reviewed by me and considered in my medical decision making (see chart for details).    Nephrolithiasis.  Patient is a 38 year old female with past medical history of kidney stones.  She is presenting with left flank pain. Urine with moderate blood X-ray revealed left nephrolithiasis. Will give patient pain medication and nausea medication and have her monitor symptoms over the next couple days. If her symptoms worsen it was recommended she go to the ER for further evaluation management. Also instructed to increase fluids. Final Clinical Impressions(s) / UC Diagnoses   Final diagnoses:  Nephrolithiasis     Discharge Instructions     You have a kidney stone. I am sending some pain medication and nausea medication to the pharmacy. Take the medication as prescribed Make sure you are drinking plenty of fluids to include water and Gatorade. If your symptoms continue or worsen over the next 24 to 48 hours you need to go to the hospital for further evaluation with a CT scan    ED Prescriptions    Medication Sig Dispense Auth. Provider   ondansetron (ZOFRAN ODT) 4 MG disintegrating tablet Take 1 tablet (4 mg total) by mouth every 8 (eight) hours as needed for nausea or vomiting. 20 tablet Kele Barthelemy A, NP   traMADol (ULTRAM) 50 MG tablet Take 1 tablet (50 mg total) by mouth every 6 (six) hours as needed. 15 tablet Dahlia Byes A, NP     Controlled Substance Prescriptions [place] Controlled Substance Registry consulted? Not Applicable   Janace Aris, NP 07/07/18 1228

## 2018-07-07 NOTE — ED Triage Notes (Signed)
Pt presents with complaints of left flank pain and middle abdominal pain that is worse with a deep breath in. Pt reports decreased appetite and nausea since Saturday.

## 2018-07-07 NOTE — Discharge Instructions (Addendum)
You have a kidney stone. I am sending some pain medication and nausea medication to the pharmacy. Take the medication as prescribed Make sure you are drinking plenty of fluids to include water and Gatorade. If your symptoms continue or worsen over the next 24 to 48 hours you need to go to the hospital for further evaluation with a CT scan

## 2018-07-09 ENCOUNTER — Other Ambulatory Visit: Payer: Self-pay | Admitting: Internal Medicine

## 2018-07-12 ENCOUNTER — Encounter: Payer: Self-pay | Admitting: Emergency Medicine

## 2018-07-12 ENCOUNTER — Emergency Department: Payer: BLUE CROSS/BLUE SHIELD

## 2018-07-12 ENCOUNTER — Emergency Department
Admission: EM | Admit: 2018-07-12 | Discharge: 2018-07-12 | Disposition: A | Discharge status: Home / Self Care | Payer: BLUE CROSS/BLUE SHIELD | Attending: Emergency Medicine | Admitting: Emergency Medicine

## 2018-07-12 ENCOUNTER — Other Ambulatory Visit: Payer: Self-pay

## 2018-07-12 DIAGNOSIS — Z87891 Personal history of nicotine dependence: Secondary | ICD-10-CM | POA: Insufficient documentation

## 2018-07-12 DIAGNOSIS — I10 Essential (primary) hypertension: Secondary | ICD-10-CM | POA: Diagnosis not present

## 2018-07-12 DIAGNOSIS — M79605 Pain in left leg: Secondary | ICD-10-CM | POA: Diagnosis not present

## 2018-07-12 DIAGNOSIS — R109 Unspecified abdominal pain: Secondary | ICD-10-CM | POA: Diagnosis not present

## 2018-07-12 DIAGNOSIS — N201 Calculus of ureter: Secondary | ICD-10-CM | POA: Diagnosis not present

## 2018-07-12 DIAGNOSIS — R1084 Generalized abdominal pain: Secondary | ICD-10-CM | POA: Diagnosis not present

## 2018-07-12 DIAGNOSIS — J45909 Unspecified asthma, uncomplicated: Secondary | ICD-10-CM | POA: Insufficient documentation

## 2018-07-12 DIAGNOSIS — Z79899 Other long term (current) drug therapy: Secondary | ICD-10-CM | POA: Insufficient documentation

## 2018-07-12 DIAGNOSIS — N1330 Unspecified hydronephrosis: Secondary | ICD-10-CM | POA: Diagnosis not present

## 2018-07-12 DIAGNOSIS — R52 Pain, unspecified: Secondary | ICD-10-CM | POA: Diagnosis not present

## 2018-07-12 DIAGNOSIS — R61 Generalized hyperhidrosis: Secondary | ICD-10-CM | POA: Diagnosis not present

## 2018-07-12 LAB — COMPREHENSIVE METABOLIC PANEL
ALT: 19 U/L (ref 0–44)
AST: 26 U/L (ref 15–41)
Albumin: 3.9 g/dL (ref 3.5–5.0)
Alkaline Phosphatase: 88 U/L (ref 38–126)
Anion gap: 10 (ref 5–15)
BUN: 9 mg/dL (ref 6–20)
CO2: 21 mmol/L — ABNORMAL LOW (ref 22–32)
Calcium: 8.9 mg/dL (ref 8.9–10.3)
Chloride: 107 mmol/L (ref 98–111)
Creatinine, Ser: 0.72 mg/dL (ref 0.44–1.00)
GFR calc Af Amer: >60 mL/min (ref >60)
GFR calc non Af Amer: >60 mL/min (ref >60)
Glucose, Bld: 103 mg/dL — ABNORMAL HIGH (ref 70–99)
Potassium: 3.9 mmol/L (ref 3.5–5.1)
Sodium: 138 mmol/L (ref 135–145)
Total Bilirubin: 0.8 mg/dL (ref 0.3–1.2)
Total Protein: 7.5 g/dL (ref 6.5–8.1)

## 2018-07-12 LAB — URINALYSIS, COMPLETE (UACMP) WITH MICROSCOPIC
Bacteria, UA: NONE SEEN
Bilirubin Urine: NEGATIVE
Glucose, UA: NEGATIVE mg/dL
Hgb urine dipstick: NEGATIVE
Ketones, ur: NEGATIVE mg/dL
Leukocytes,Ua: NEGATIVE
Nitrite: NEGATIVE
Protein, ur: 100 mg/dL — AB
Specific Gravity, Urine: 1.028 (ref 1.005–1.030)
pH: 5 (ref 5.0–8.0)

## 2018-07-12 LAB — CBC
HCT: 37.5 % (ref 36.0–46.0)
Hemoglobin: 11.5 g/dL — ABNORMAL LOW (ref 12.0–15.0)
MCH: 23.3 pg — ABNORMAL LOW (ref 26.0–34.0)
MCHC: 30.7 g/dL (ref 30.0–36.0)
MCV: 76.1 fL — ABNORMAL LOW (ref 80.0–100.0)
Platelets: 307 10*3/uL (ref 150–400)
RBC: 4.93 MIL/uL (ref 3.87–5.11)
RDW: 15.9 % — ABNORMAL HIGH (ref 11.5–15.5)
WBC: 8.2 10*3/uL (ref 4.0–10.5)
nRBC: 0 % (ref 0.0–0.2)

## 2018-07-12 LAB — LIPASE, BLOOD: Lipase: 20 U/L (ref 11–51)

## 2018-07-12 MED ORDER — KETOROLAC TROMETHAMINE 30 MG/ML IJ SOLN
30.0000 mg | Freq: Once | INTRAMUSCULAR | Status: AC
  Administered 2018-07-12: 30 mg via INTRAVENOUS
  Filled 2018-07-12: qty 1

## 2018-07-12 MED ORDER — OXYCODONE-ACETAMINOPHEN 5-325 MG PO TABS: 1.0000 | ORAL_TABLET | ORAL | 0 refills | Status: DC | PRN

## 2018-07-12 MED ORDER — SODIUM CHLORIDE 0.9 % IV BOLUS
1000.0000 mL | Freq: Once | INTRAVENOUS | Status: AC
  Administered 2018-07-12: 22:00:00 1000 mL via INTRAVENOUS

## 2018-07-12 MED ORDER — TAMSULOSIN HCL 0.4 MG PO CAPS: 0.4000 mg | ORAL_CAPSULE | Freq: Every day | ORAL | 0 refills | Status: DC

## 2018-07-12 MED ORDER — ONDANSETRON 4 MG PO TBDP: 4.0000 mg | ORAL_TABLET | Freq: Three times a day (TID) | ORAL | 0 refills | Status: DC | PRN

## 2018-07-12 MED ORDER — KETOROLAC TROMETHAMINE 10 MG PO TABS: 10.0000 mg | ORAL_TABLET | Freq: Four times a day (QID) | ORAL | 0 refills | Status: DC | PRN

## 2018-07-12 MED ORDER — ONDANSETRON HCL 4 MG/2ML IJ SOLN
4.0000 mg | Freq: Once | INTRAMUSCULAR | Status: AC
  Administered 2018-07-12: 4 mg via INTRAVENOUS
  Filled 2018-07-12: qty 2

## 2018-07-12 NOTE — ED Triage Notes (Signed)
Pt presents from home via [facility] with lower abdominal pain. Pain started yesterday, but became severe tonight. Pt vomited x1 due to abdominal pain.Pt has hx of hypertension. 50 mcg fentanyl given in route as well as 4mg  zofran by ems. 22G IV placed in right hand by ems.

## 2018-07-12 NOTE — ED Provider Notes (Signed)
[facility] Emergency Department Provider Note  Time seen: 9:56 PM  I have reviewed the triage vital signs and the nursing notes.   HISTORY  Chief Complaint Abdominal Pain   HPI Victoria Booth is a 38 y.o. female with a past medical history of anemia, asthma, obesity, presents to the emergency department for left leg pain.  According to the patient approximately 5 or 6 days ago she developed left flank pain, went to urgent care was diagnosed with a possible kidney stone.  Patient states she has had a stone in her kidney for years and has never had any issues with it.  Patient states the pain went away and had been pain-free until today when she developed sudden sharp acute pain to the left back along with nausea and vomiting.  Patient denies any hematuria or dysuria.  Denies any fever cough congestion or shortness of breath.  Describes her pain as moderate to severe located in the left back/left flank, sharp in nature.   Past Medical History:  Diagnosis Date  . Anemia   . Asthma   . Hypertension   . LUQ abdominal pain 10/16/2017  . Obesity     Patient Active Problem List   Diagnosis Date Noted  . Functional dyspepsia 02/20/2018  . IBS (irritable colon syndrome) 02/20/2018  . Iron deficiency anemia 08/18/2017  . Acute pain of left shoulder 08/18/2017  . Insomnia 07/29/2017  . Left lower quadrant abdominal pain of unknown etiology 07/13/2017  . OSA (obstructive sleep apnea) 06/05/2017  . Major depressive disorder 03/21/2017  . Migraine 10/24/2016  . Hematuria 09/24/2016  . Prediabetes 08/21/2016  . Allergic rhinitis 06/18/2016  . Left flank pain 06/05/2016  . PCOS (polycystic ovarian syndrome) 04/05/2016  . Essential hypertension, benign 12/01/2014  . Asthma, chronic 12/01/2014  . Microcytic anemia 12/01/2014  . Gastroesophageal reflux disease 12/01/2014  . Preventative health care 12/01/2014    Past Surgical History:  Procedure Laterality Date  .  TOENAIL EXCISION      Prior to Admission medications   Medication Sig Start Date End Date Taking? Authorizing Provider  albuterol (PROVENTIL HFA;VENTOLIN HFA) 108 (90 Base) MCG/ACT inhaler INHALE 1 TO 2 PUFFS INTO THE LUNGS EVERY 6 HOURS AS NEEDED FOR WHEEZING OR SHORTNESS OF BREATH Patient taking differently: Inhale 2 puffs into the lungs every 6 (six) hours as needed for wheezing or shortness of breath.  03/31/18   Chundi, Sherlyn Lees, MD  albuterol (PROVENTIL) (2.5 MG/3ML) 0.083% nebulizer solution Take 6 mLs (5 mg total) by nebulization every 6 (six) hours as needed for wheezing or shortness of breath. 04/25/18   Linus Mako B, NP  amLODipine (NORVASC) 10 MG tablet TAKE 1 TABLET BY MOUTH EVERY DAY 07/11/18   Chundi, Sherlyn Lees, MD  Cetirizine HCl (ZYRTEC ALLERGY) 10 MG CAPS Take 1 capsule (10 mg total) by mouth daily. 05/20/18   Angelita Ingles, MD  DM-GG & DM-APAP-CPM (CORICIDIN HBP DAY/NIGHT COLD) 10-20 &15-200-2 MG MISC Take 1-2 capsules by mouth 2 (two) times daily as needed. 05/20/18   Angelita Ingles, MD  fluticasone (FLONASE) 50 MCG/ACT nasal spray Place 2 sprays into both nostrils daily for 7 days. 05/20/18 05/27/18  Angelita Ingles, MD  ondansetron (ZOFRAN ODT) 4 MG disintegrating tablet Take 1 tablet (4 mg total) by mouth every 8 (eight) hours as needed for nausea or vomiting. 07/07/18   Dahlia Byes A, NP  pantoprazole (PROTONIX) 40 MG tablet TAKE ONE TABLET BY MOUTH TWICE DAILY 05/18/18  Chundi, Vahini, MD  spironolactone (ALDACTONE) 25 MG tablet TAKE 1 TABLET (25 MG TOTAL) BY MOUTH DAILY FOR 90 DOSES. 07/11/18 10/09/18  Lorenso Courier, MD  SUMAtriptan (IMITREX) 50 MG tablet Take 1 tablet (50 mg total) by mouth once as needed for migraine. May repeat in 2 hours if headache persists or recurs. 12/19/17 04/14/18  Lorenso Courier, MD  traMADol (ULTRAM) 50 MG tablet Take 1 tablet (50 mg total) by mouth every 6 (six) hours as needed. 07/07/18   Janace Aris, NP    Allergies  Allergen Reactions  .  Penicillins Shortness Of Breath, Rash and Other (See Comments)    Has patient had a PCN reaction causing immediate rash, facial/tongue/throat swelling, SOB or lightheadedness with hypotension:yes Has patient had a PCN reaction causing severe rash involving mucus membranes or skin necrosis: no Has patient had a PCN reaction that required hospitalization : no Has patient had a PCN reaction occurring within the last 10 years:no  If all of the above answers are "NO", then may proceed with Cephalosporin use.   . Shellfish Allergy Swelling  . Sulfa Antibiotics Hives  . Keflex [Cephalexin] Itching and Other (See Comments)    Itching hands and legs  . Lisinopril Cough  . Iodine Rash    Family History  Problem Relation Age of Onset  . Diabetes Mother   . Hypertension Mother   . Heart attack Mother        2016- 52 yrs  . Breast cancer Maternal Grandmother   . Hypertension Father   . Colon cancer Neg Hx   . Stomach cancer Neg Hx   . Pancreatic cancer Neg Hx     Social History Social History   Tobacco Use  . Smoking status: Former Smoker    Packs/day: 0.10    Types: Cigarettes    Last attempt to quit: 04/06/2015    Years since quitting: 3.2  . Smokeless tobacco: Never Used  Substance Use Topics  . Alcohol use: No    Alcohol/week: 0.0 standard drinks  . Drug use: No    Review of Systems Constitutional: Negative for fever. Cardiovascular: Negative for chest pain. Respiratory: Negative for shortness of breath. Gastrointestinal: Sharp severe left flank pain.  Positive nausea vomiting. Genitourinary: Negative for hematuria or dysuria Musculoskeletal: Negative for musculoskeletal complaints Skin: Negative for skin complaints  Neurological: Negative for headache All other ROS negative  ____________________________________________   PHYSICAL EXAM:  VITAL SIGNS: ED Triage Vitals  Enc Vitals Group     BP --      Pulse --      Resp --      Temp 07/12/18 2139 97.6 F (36.4  C)     Temp Source 07/12/18 2139 Oral     SpO2 --      Weight 07/12/18 2140 298 lb (135.2 kg)     Height 07/12/18 2140 5\' 7"  (1.702 m)     Head Circumference --      Peak Flow --      Pain Score 07/12/18 2140 10     Pain Loc --      Pain Edu? --      Excl. in GC? --    Constitutional: Alert and oriented. Well appearing and in no distress. Eyes: Normal exam ENT      Head: Normocephalic and atraumatic.      Mouth/Throat: Mucous membranes are moist. Cardiovascular: Normal rate, regular rhythm. No murmur Respiratory: Normal respiratory effort without tachypnea nor retractions. Breath sounds are  clear  Gastrointestinal: Soft and nontender. No distention.  Slight left CVA tenderness to palpation. Musculoskeletal: Nontender with normal range of motion in all extremities.  Neurologic:  Normal speech and language. No gross focal neurologic deficits  Skin:  Skin is warm, dry and intact.  Psychiatric: Mood and affect are normal.   ____________________________________________   RADIOLOGY  IMPRESSION: Left hydronephrosis due to 9 x 6 mm stone at the UPJ.  ____________________________________________   INITIAL IMPRESSION / ASSESSMENT AND PLAN / ED COURSE  Pertinent labs & imaging results that were available during my care of the patient were reviewed by me and considered in my medical decision making (see chart for details).   Patient presents to the emergency department for left flank pain.  Differential this time would include ureterolithiasis, urinary tract infection, pyelonephritis, colitis, diverticulitis.  We will check labs, we will dose Toradol and Zofran, IV fluids and obtain a CT renal scan to further evaluate.  Patient agreeable to plan of care.  Patient CT scan shows a 9 x 6 left UPJ stone.  Very likely cause of the patient's discomfort.  Otherwise negative CT.  Lab work largely nonrevealing.  We will send a urine culture.  We will discharge with Flomax, Toradol and Percocet.   We will have the patient follow-up with urology.  Victoria Booth was evaluated in Emergency Department on 07/12/2018 for the symptoms described in the history of present illness. She was evaluated in the context of the global COVID-19 pandemic, which necessitated consideration that the patient might be at risk for infection with the SARS-CoV-2 virus that causes COVID-19. Institutional protocols and algorithms that pertain to the evaluation of patients at risk for COVID-19 are in a state of rapid change based on information released by regulatory bodies including the CDC and federal and state organizations. These policies and algorithms were followed during the patient's care in the ED.  ____________________________________________   FINAL CLINICAL IMPRESSION(S) / ED DIAGNOSES  Left flank pain Ureterolithiasis   Minna Antis, MD 07/12/18 2248

## 2018-07-13 ENCOUNTER — Telehealth: Payer: Self-pay | Admitting: Urology

## 2018-07-13 NOTE — Telephone Encounter (Signed)
Please advise 

## 2018-07-13 NOTE — Telephone Encounter (Signed)
Pt was seen in the ER and was diagnosed with 9 x 6 left UPJ stone, she needs a follow up appt. Please advise.

## 2018-07-13 NOTE — Telephone Encounter (Signed)
See if she can come in today Tuesday 6/2 at 945

## 2018-07-14 ENCOUNTER — Encounter: Payer: Self-pay | Admitting: Urology

## 2018-07-14 ENCOUNTER — Other Ambulatory Visit
Admission: RE | Admit: 2018-07-14 | Discharge: 2018-07-14 | Disposition: A | Discharge status: Home / Self Care | Payer: BLUE CROSS/BLUE SHIELD | Source: Ambulatory Visit | Attending: Urology | Admitting: Urology

## 2018-07-14 ENCOUNTER — Other Ambulatory Visit: Payer: Self-pay | Admitting: Radiology

## 2018-07-14 ENCOUNTER — Ambulatory Visit (INDEPENDENT_AMBULATORY_CARE_PROVIDER_SITE_OTHER): Payer: BLUE CROSS/BLUE SHIELD | Admitting: Urology

## 2018-07-14 ENCOUNTER — Other Ambulatory Visit: Payer: Self-pay

## 2018-07-14 VITALS — BP 146/75 | HR 79 | Ht 67.0 in | Wt 289.0 lb

## 2018-07-14 DIAGNOSIS — N200 Calculus of kidney: Secondary | ICD-10-CM

## 2018-07-14 DIAGNOSIS — N23 Unspecified renal colic: Secondary | ICD-10-CM | POA: Diagnosis not present

## 2018-07-14 DIAGNOSIS — Z1159 Encounter for screening for other viral diseases: Secondary | ICD-10-CM | POA: Diagnosis not present

## 2018-07-14 DIAGNOSIS — N132 Hydronephrosis with renal and ureteral calculous obstruction: Secondary | ICD-10-CM

## 2018-07-14 LAB — URINE CULTURE

## 2018-07-14 LAB — SARS CORONAVIRUS 2 BY RT PCR (HOSPITAL ORDER, PERFORMED IN ~~LOC~~ HOSPITAL LAB): SARS Coronavirus 2: NEGATIVE

## 2018-07-14 MED ORDER — PROMETHAZINE HCL 25 MG PO TABS: 25.0000 mg | ORAL_TABLET | Freq: Four times a day (QID) | ORAL | 0 refills | Status: DC | PRN

## 2018-07-14 NOTE — H&P (View-Only) (Signed)
07/14/2018 11:59 AM   Victoria Booth 1980/11/06 [id number]  Referring provider: Lorenso Courierhundi, Vahini, MD [address] [place], [place] [address]  Chief Complaint  Patient presents with  . Nephrolithiasis    new patient    HPI: Victoria Booth is a 38 year old female who presents for evaluation of left renal colic.  She was seen at a [facility] urgent care in [place] on 07/07/2018 with a 3-day history of left flank and left upper quadrant abdominal pain.  She had some nausea without vomiting.  Urinalysis showed microhematuria and a KUB showed left nephrolithiasis.  She was given tramadol and ondansetron and it was recommended she monitor her symptoms and be evaluated in the ED for worsening pain.  She developed acute worsening of her pain on 07/12/2018 and presented to [facility].  A stone protocol CT was performed which showed a 6 x 9 mm left UPJ stone.  She was given Toradol and Zofran with resolution of her pain.  She was discharged on tamsulosin, oral ketorolac and Percocet.  Since her ED visit she has complained of nausea with vomiting.  Zofran was not approved by her insurance and is apparently waiting prior authorization.  She has not taken the Percocet for pain and states she has taken ibuprofen.  Denies fever or chills.  She was diagnosed with a 5 mm left renal calculus in 2015.  She was aware that she had a stone but has been asymptomatic until now.   PMH: Past Medical History:  Diagnosis Date  . Anemia   . Asthma   . Hypertension   . LUQ abdominal pain 10/16/2017  . Obesity     Surgical History: Past Surgical History:  Procedure Laterality Date  . TOENAIL EXCISION      Home Medications:  Allergies as of 07/14/2018      Reactions   Penicillins Shortness Of Breath, Rash, Other (See Comments)   Has patient had a PCN reaction causing immediate rash, facial/tongue/throat swelling, SOB or lightheadedness with hypotension:yes Has patient had a PCN reaction causing severe rash  involving mucus membranes or skin necrosis: no Has patient had a PCN reaction that required hospitalization : no Has patient had a PCN reaction occurring within the last 10 years:no  If all of the above answers are "NO", then may proceed with Cephalosporin use.   Shellfish Allergy Swelling   Sulfa Antibiotics Hives   Keflex [cephalexin] Itching, Other (See Comments)   Itching hands and legs   Lisinopril Cough   Iodine Rash      Medication List       Accurate as of July 14, 2018 11:59 AM. If you have any questions, ask your nurse or doctor.        STOP taking these medications   albuterol (2.5 MG/3ML) 0.083% nebulizer solution Commonly known as:  PROVENTIL Stopped by:  Riki AltesScott C Tanzie Rothschild, MD   albuterol 108 (90 Base) MCG/ACT inhaler Commonly known as:  VENTOLIN HFA Stopped by:  Riki AltesScott C Dorotha Hirschi, MD   Cetirizine HCl 10 MG Caps Commonly known as:  ZyrTEC Allergy Stopped by:  Riki AltesScott C Lariyah Shetterly, MD   fluticasone 50 MCG/ACT nasal spray Commonly known as:  FLONASE Stopped by:  Riki AltesScott C Sarahann Horrell, MD   ketorolac 10 MG tablet Commonly known as:  TORADOL Stopped by:  Riki AltesScott C Abigael Mogle, MD   oxyCODONE-acetaminophen 5-325 MG tablet Commonly known as:  Percocet Stopped by:  Riki AltesScott C Schawn Byas, MD   SUMAtriptan 50 MG tablet Commonly known as:  Imitrex Stopped by:  Riki Altes, MD   tamsulosin 0.4 MG Caps capsule Commonly known as:  FLOMAX Stopped by:  Riki Altes, MD   traMADol 50 MG tablet Commonly known as:  ULTRAM Stopped by:  Riki Altes, MD     TAKE these medications   amLODipine 10 MG tablet Commonly known as:  NORVASC TAKE 1 TABLET BY MOUTH EVERY DAY   DM-GG & DM-APAP-CPM 10-20 &15-200-2 MG Misc Commonly known as:  Coricidin HBP Day/Night Cold Take 1-2 capsules by mouth 2 (two) times daily as needed.   ondansetron 4 MG disintegrating tablet Commonly known as:  Zofran ODT Take 1 tablet (4 mg total) by mouth every 8 (eight) hours as needed for nausea or  vomiting.   pantoprazole 40 MG tablet Commonly known as:  PROTONIX TAKE ONE TABLET BY MOUTH TWICE DAILY   spironolactone 25 MG tablet Commonly known as:  ALDACTONE TAKE 1 TABLET (25 MG TOTAL) BY MOUTH DAILY FOR 90 DOSES.       Allergies:  Allergies  Allergen Reactions  . Penicillins Shortness Of Breath, Rash and Other (See Comments)    Has patient had a PCN reaction causing immediate rash, facial/tongue/throat swelling, SOB or lightheadedness with hypotension:yes Has patient had a PCN reaction causing severe rash involving mucus membranes or skin necrosis: no Has patient had a PCN reaction that required hospitalization : no Has patient had a PCN reaction occurring within the last 10 years:no  If all of the above answers are "NO", then may proceed with Cephalosporin use.   . Shellfish Allergy Swelling  . Sulfa Antibiotics Hives  . Keflex [Cephalexin] Itching and Other (See Comments)    Itching hands and legs  . Lisinopril Cough  . Iodine Rash    Family History: Family History  Problem Relation Age of Onset  . Diabetes Mother   . Hypertension Mother   . Heart attack Mother        2016- 52 yrs  . Breast cancer Maternal Grandmother   . Hypertension Father   . Colon cancer Neg Hx   . Stomach cancer Neg Hx   . Pancreatic cancer Neg Hx     Social History:  reports that she quit smoking about 3 years ago. Her smoking use included cigarettes. She smoked 0.10 packs per day. She has never used smokeless tobacco. She reports that she does not drink alcohol or use drugs.  ROS: UROLOGY Frequent Urination?: Yes Hard to postpone urination?: No Burning/pain with urination?: Yes Get up at night to urinate?: Yes Leakage of urine?: Yes Urine stream starts and stops?: Yes Trouble starting stream?: No Do you have to strain to urinate?: No Blood in urine?: Yes Urinary tract infection?: Yes Sexually transmitted disease?: No Injury to kidneys or bladder?: No Painful intercourse?:  No Weak stream?: No Currently pregnant?: No Vaginal bleeding?: No Last menstrual period?: n  Gastrointestinal Nausea?: Yes Vomiting?: Yes Indigestion/heartburn?: Yes Diarrhea?: No Constipation?: Yes  Constitutional Fever: No Night sweats?: No Weight loss?: Yes Fatigue?: Yes  Skin Skin rash/lesions?: No Itching?: No  Eyes Blurred vision?: Yes Double vision?: No  Ears/Nose/Throat Sore throat?: No Sinus problems?: Yes  Hematologic/Lymphatic Swollen glands?: No Easy bruising?: No  Cardiovascular Leg swelling?: No Chest pain?: No  Respiratory Cough?: No Shortness of breath?: No  Endocrine Excessive thirst?: No  Musculoskeletal Back pain?: Yes Joint pain?: No  Neurological Headaches?: Yes Dizziness?: No  Psychologic Depression?: No Anxiety?: No  Physical Exam: BP (!) 146/75   Pulse  79   Ht 5\' 7"  (1.702 m)   Wt 289 lb (131.1 kg)   LMP 06/19/2018 Comment: neg preg test  BMI 45.26 kg/m   Constitutional:  Alert and oriented, No acute distress. HEENT: Victoria Booth AT, moist mucus membranes.  Trachea midline, no masses. Cardiovascular: No clubbing, cyanosis, or edema.  RRR Respiratory: Normal respiratory effort, no increased work of breathing.  Lungs clear GI: Abdomen is soft, nontender, nondistended, no abdominal masses GU: No CVA tenderness Lymph: No cervical or inguinal lymphadenopathy. Skin: No rashes, bruises or suspicious lesions. Neurologic: Grossly intact, no focal deficits, moving all 4 extremities. Psychiatric: Normal mood and affect.  Laboratory Data: Lab Results  Component Value Date   WBC 8.2 07/12/2018   HGB 11.5 (L) 07/12/2018   HCT 37.5 07/12/2018   MCV 76.1 (L) 07/12/2018   PLT 307 07/12/2018    Lab Results  Component Value Date   CREATININE 0.72 07/12/2018     Pertinent Imaging: CT was personally reviewed. Density:1200 HU SSD: ~ 13 cm   Results for orders placed during the hospital encounter of 07/12/18  CT Renal Stone  Study   Narrative CLINICAL DATA:  Flank pain with stone disease suspected  EXAM: CT ABDOMEN AND PELVIS WITHOUT CONTRAST  TECHNIQUE: Multidetector CT imaging of the abdomen and pelvis was performed following the standard protocol without IV contrast.  COMPARISON:  10/28/2017  FINDINGS: Lower chest:  No contributory findings.  Hepatobiliary: No focal liver abnormality.No evidence of biliary obstruction or stone.  Pancreas: Unremarkable.  Spleen: Unremarkable.  Adrenals/Urinary Tract: Negative adrenals. Left hydronephrosis and renal expansion due to a 9 x 6 mm stone at the UPJ. Unremarkable collapsed urinary bladder.  Stomach/Bowel:  No obstruction. No appendicitis.  Vascular/Lymphatic: No acute vascular abnormality. No mass or adenopathy.  Reproductive:2.7 cm cystic density at the level of the cervix, seen since at least 2018 and most consistent with a large nabothian cyst.  Other: No ascites or pneumoperitoneum.  Musculoskeletal: No acute abnormalities.  IMPRESSION: Left hydronephrosis due to 9 x 6 mm stone at the UPJ.   Electronically Signed   By: Marnee Spring M.D.   On: 07/12/2018 22:41     Assessment & Plan:   We discussed various treatment options for urolithiasis including observation with or without medical expulsive therapy, shockwave lithotripsy (SWL), ureteroscopy and laser lithotripsy with stent placement, and percutaneous nephrolithotomy.  We discussed that management is based on stone size, location, density, patient co-morbidities, and patient preference.   Based on stone size she was informed it is less likely this would pass spontaneously.  We discussed shockwave lithotripsy and ureteroscopy in detail.  SWL has a lower stone free rate in a single procedure, but also a lower complication rate compared to ureteroscopy and avoids a stent and associated stent related symptoms. Possible complications include renal hematoma, steinstrasse, and need for  additional treatment. Based on stone density and skin to stone distance success rate would be lower than a distance <10 mm and a density <1000 HU  Ureteroscopy with laser lithotripsy and stent placement has a higher stone free rate than SWL in a single procedure, however increased complication rate including possible infection, ureteral injury, bleeding, and stent related morbidity. Common stent related symptoms include dysuria, urgency/frequency, and flank pain.  After an extensive discussion of the risks and benefits of the above treatment options, the patient would like to proceed with shockwave lithotripsy this week.  She was informed to discontinue taking ibuprofen and not to take the ketorolac she  was given.  Use only Tylenol or oxycodone for her pain. Rx Phenergan was sent to her pharmacy for nausea  Eyan Hagood C Rionna Feltes, MD  [facility] [address], [address] [place], [place] [address] [phone]  

## 2018-07-14 NOTE — Progress Notes (Signed)
07/14/2018 11:59 AM   Victoria Booth 1980/11/06 [id number]  Referring provider: Lorenso Courierhundi, Vahini, MD [address] [place], [place] [address]  Chief Complaint  Patient presents with  . Nephrolithiasis    new patient    HPI: Victoria Booth is a 38 year old female who presents for evaluation of left renal colic.  She was seen at a [facility] urgent care in [place] on 07/07/2018 with a 3-day history of left flank and left upper quadrant abdominal pain.  She had some nausea without vomiting.  Urinalysis showed microhematuria and a KUB showed left nephrolithiasis.  She was given tramadol and ondansetron and it was recommended she monitor her symptoms and be evaluated in the ED for worsening pain.  She developed acute worsening of her pain on 07/12/2018 and presented to the [facility] ED.  A stone protocol CT was performed which showed a 6 x 9 mm left UPJ stone.  She was given Toradol and Zofran with resolution of her pain.  She was discharged on tamsulosin, oral ketorolac and Percocet.  Since her ED visit she has complained of nausea with vomiting.  Zofran was not approved by her insurance and is apparently waiting prior authorization.  She has not taken the Percocet for pain and states she has taken ibuprofen.  Denies fever or chills.  She was diagnosed with a 5 mm left renal calculus in 2015.  She was aware that she had a stone but has been asymptomatic until now.   PMH: Past Medical History:  Diagnosis Date  . Anemia   . Asthma   . Hypertension   . LUQ abdominal pain 10/16/2017  . Obesity     Surgical History: Past Surgical History:  Procedure Laterality Date  . TOENAIL EXCISION      Home Medications:  Allergies as of 07/14/2018      Reactions   Penicillins Shortness Of Breath, Rash, Other (See Comments)   Has patient had a PCN reaction causing immediate rash, facial/tongue/throat swelling, SOB or lightheadedness with hypotension:yes Has patient had a PCN reaction causing severe rash  involving mucus membranes or skin necrosis: no Has patient had a PCN reaction that required hospitalization : no Has patient had a PCN reaction occurring within the last 10 years:no  If all of the above answers are "NO", then may proceed with Cephalosporin use.   Shellfish Allergy Swelling   Sulfa Antibiotics Hives   Keflex [cephalexin] Itching, Other (See Comments)   Itching hands and legs   Lisinopril Cough   Iodine Rash      Medication List       Accurate as of July 14, 2018 11:59 AM. If you have any questions, ask your nurse or doctor.        STOP taking these medications   albuterol (2.5 MG/3ML) 0.083% nebulizer solution Commonly known as:  PROVENTIL Stopped by:  Riki AltesScott C Linsay Vogt, MD   albuterol 108 (90 Base) MCG/ACT inhaler Commonly known as:  VENTOLIN HFA Stopped by:  Riki AltesScott C Adem Costlow, MD   Cetirizine HCl 10 MG Caps Commonly known as:  ZyrTEC Allergy Stopped by:  Riki AltesScott C Genevia Bouldin, MD   fluticasone 50 MCG/ACT nasal spray Commonly known as:  FLONASE Stopped by:  Riki AltesScott C Ellanie Oppedisano, MD   ketorolac 10 MG tablet Commonly known as:  TORADOL Stopped by:  Riki AltesScott C Abishai Viegas, MD   oxyCODONE-acetaminophen 5-325 MG tablet Commonly known as:  Percocet Stopped by:  Riki AltesScott C Waynetta Metheny, MD   SUMAtriptan 50 MG tablet Commonly known as:  Imitrex Stopped by:  Riki Altes, MD   tamsulosin 0.4 MG Caps capsule Commonly known as:  FLOMAX Stopped by:  Riki Altes, MD   traMADol 50 MG tablet Commonly known as:  ULTRAM Stopped by:  Riki Altes, MD     TAKE these medications   amLODipine 10 MG tablet Commonly known as:  NORVASC TAKE 1 TABLET BY MOUTH EVERY DAY   DM-GG & DM-APAP-CPM 10-20 &15-200-2 MG Misc Commonly known as:  Coricidin HBP Day/Night Cold Take 1-2 capsules by mouth 2 (two) times daily as needed.   ondansetron 4 MG disintegrating tablet Commonly known as:  Zofran ODT Take 1 tablet (4 mg total) by mouth every 8 (eight) hours as needed for nausea or  vomiting.   pantoprazole 40 MG tablet Commonly known as:  PROTONIX TAKE ONE TABLET BY MOUTH TWICE DAILY   spironolactone 25 MG tablet Commonly known as:  ALDACTONE TAKE 1 TABLET (25 MG TOTAL) BY MOUTH DAILY FOR 90 DOSES.       Allergies:  Allergies  Allergen Reactions  . Penicillins Shortness Of Breath, Rash and Other (See Comments)    Has patient had a PCN reaction causing immediate rash, facial/tongue/throat swelling, SOB or lightheadedness with hypotension:yes Has patient had a PCN reaction causing severe rash involving mucus membranes or skin necrosis: no Has patient had a PCN reaction that required hospitalization : no Has patient had a PCN reaction occurring within the last 10 years:no  If all of the above answers are "NO", then may proceed with Cephalosporin use.   . Shellfish Allergy Swelling  . Sulfa Antibiotics Hives  . Keflex [Cephalexin] Itching and Other (See Comments)    Itching hands and legs  . Lisinopril Cough  . Iodine Rash    Family History: Family History  Problem Relation Age of Onset  . Diabetes Mother   . Hypertension Mother   . Heart attack Mother        2016- 52 yrs  . Breast cancer Maternal Grandmother   . Hypertension Father   . Colon cancer Neg Hx   . Stomach cancer Neg Hx   . Pancreatic cancer Neg Hx     Social History:  reports that she quit smoking about 3 years ago. Her smoking use included cigarettes. She smoked 0.10 packs per day. She has never used smokeless tobacco. She reports that she does not drink alcohol or use drugs.  ROS: UROLOGY Frequent Urination?: Yes Hard to postpone urination?: No Burning/pain with urination?: Yes Get up at night to urinate?: Yes Leakage of urine?: Yes Urine stream starts and stops?: Yes Trouble starting stream?: No Do you have to strain to urinate?: No Blood in urine?: Yes Urinary tract infection?: Yes Sexually transmitted disease?: No Injury to kidneys or bladder?: No Painful intercourse?:  No Weak stream?: No Currently pregnant?: No Vaginal bleeding?: No Last menstrual period?: n  Gastrointestinal Nausea?: Yes Vomiting?: Yes Indigestion/heartburn?: Yes Diarrhea?: No Constipation?: Yes  Constitutional Fever: No Night sweats?: No Weight loss?: Yes Fatigue?: Yes  Skin Skin rash/lesions?: No Itching?: No  Eyes Blurred vision?: Yes Double vision?: No  Ears/Nose/Throat Sore throat?: No Sinus problems?: Yes  Hematologic/Lymphatic Swollen glands?: No Easy bruising?: No  Cardiovascular Leg swelling?: No Chest pain?: No  Respiratory Cough?: No Shortness of breath?: No  Endocrine Excessive thirst?: No  Musculoskeletal Back pain?: Yes Joint pain?: No  Neurological Headaches?: Yes Dizziness?: No  Psychologic Depression?: No Anxiety?: No  Physical Exam: BP (!) 146/75   Pulse  79   Ht 5\' 7"  (1.702 m)   Wt 289 lb (131.1 kg)   LMP 06/19/2018 Comment: neg preg test  BMI 45.26 kg/m   Constitutional:  Alert and oriented, No acute distress. HEENT: Victoria Booth AT, moist mucus membranes.  Trachea midline, no masses. Cardiovascular: No clubbing, cyanosis, or edema.  RRR Respiratory: Normal respiratory effort, no increased work of breathing.  Lungs clear GI: Abdomen is soft, nontender, nondistended, no abdominal masses GU: No CVA tenderness Lymph: No cervical or inguinal lymphadenopathy. Skin: No rashes, bruises or suspicious lesions. Neurologic: Grossly intact, no focal deficits, moving all 4 extremities. Psychiatric: Normal mood and affect.  Laboratory Data: Lab Results  Component Value Date   WBC 8.2 07/12/2018   HGB 11.5 (L) 07/12/2018   HCT 37.5 07/12/2018   MCV 76.1 (L) 07/12/2018   PLT 307 07/12/2018    Lab Results  Component Value Date   CREATININE 0.72 07/12/2018     Pertinent Imaging: CT was personally reviewed. Density:1200 HU SSD: ~ 13 cm   Results for orders placed during the hospital encounter of 07/12/18  CT Renal Stone  Study   Narrative CLINICAL DATA:  Flank pain with stone disease suspected  EXAM: CT ABDOMEN AND PELVIS WITHOUT CONTRAST  TECHNIQUE: Multidetector CT imaging of the abdomen and pelvis was performed following the standard protocol without IV contrast.  COMPARISON:  10/28/2017  FINDINGS: Lower chest:  No contributory findings.  Hepatobiliary: No focal liver abnormality.No evidence of biliary obstruction or stone.  Pancreas: Unremarkable.  Spleen: Unremarkable.  Adrenals/Urinary Tract: Negative adrenals. Left hydronephrosis and renal expansion due to a 9 x 6 mm stone at the UPJ. Unremarkable collapsed urinary bladder.  Stomach/Bowel:  No obstruction. No appendicitis.  Vascular/Lymphatic: No acute vascular abnormality. No mass or adenopathy.  Reproductive:2.7 cm cystic density at the level of the cervix, seen since at least 2018 and most consistent with a large nabothian cyst.  Other: No ascites or pneumoperitoneum.  Musculoskeletal: No acute abnormalities.  IMPRESSION: Left hydronephrosis due to 9 x 6 mm stone at the UPJ.   Electronically Signed   By: Marnee Spring M.D.   On: 07/12/2018 22:41     Assessment & Plan:   We discussed various treatment options for urolithiasis including observation with or without medical expulsive therapy, shockwave lithotripsy (SWL), ureteroscopy and laser lithotripsy with stent placement, and percutaneous nephrolithotomy.  We discussed that management is based on stone size, location, density, patient co-morbidities, and patient preference.   Based on stone size she was informed it is less likely this would pass spontaneously.  We discussed shockwave lithotripsy and ureteroscopy in detail.  SWL has a lower stone free rate in a single procedure, but also a lower complication rate compared to ureteroscopy and avoids a stent and associated stent related symptoms. Possible complications include renal hematoma, steinstrasse, and need for  additional treatment. Based on stone density and skin to stone distance success rate would be lower than a distance <10 mm and a density <1000 HU  Ureteroscopy with laser lithotripsy and stent placement has a higher stone free rate than SWL in a single procedure, however increased complication rate including possible infection, ureteral injury, bleeding, and stent related morbidity. Common stent related symptoms include dysuria, urgency/frequency, and flank pain.  After an extensive discussion of the risks and benefits of the above treatment options, the patient would like to proceed with shockwave lithotripsy this week.  She was informed to discontinue taking ibuprofen and not to take the ketorolac she  was given.  Use only Tylenol or oxycodone for her pain. Rx Phenergan was sent to her pharmacy for nausea  Riki Altes, MD  [facility] [address], [address] [place], [place] [address] [phone]

## 2018-07-14 NOTE — Addendum Note (Signed)
Addended by: Honor Loh on: 07/14/2018 04:48 PM   Modules accepted: Orders

## 2018-07-14 NOTE — Telephone Encounter (Signed)
Called patient but could not lm her mb was full   [place]

## 2018-07-16 ENCOUNTER — Encounter: Payer: Self-pay | Admitting: Emergency Medicine

## 2018-07-16 ENCOUNTER — Other Ambulatory Visit: Payer: Self-pay

## 2018-07-16 ENCOUNTER — Encounter
Admission: RE | Disposition: A | Discharge status: Home / Self Care | Payer: Self-pay | Source: Home / Self Care | Attending: Urology

## 2018-07-16 ENCOUNTER — Ambulatory Visit
Admission: RE | Admit: 2018-07-16 | Discharge: 2018-07-16 | Disposition: A | Discharge status: Home / Self Care | Payer: BLUE CROSS/BLUE SHIELD | Attending: Urology | Admitting: Urology

## 2018-07-16 ENCOUNTER — Ambulatory Visit: Payer: BLUE CROSS/BLUE SHIELD

## 2018-07-16 DIAGNOSIS — Z880 Allergy status to penicillin: Secondary | ICD-10-CM | POA: Insufficient documentation

## 2018-07-16 DIAGNOSIS — Z882 Allergy status to sulfonamides status: Secondary | ICD-10-CM | POA: Insufficient documentation

## 2018-07-16 DIAGNOSIS — Z6841 Body Mass Index (BMI) 40.0 and over, adult: Secondary | ICD-10-CM | POA: Diagnosis not present

## 2018-07-16 DIAGNOSIS — N200 Calculus of kidney: Secondary | ICD-10-CM

## 2018-07-16 DIAGNOSIS — N132 Hydronephrosis with renal and ureteral calculous obstruction: Secondary | ICD-10-CM | POA: Diagnosis not present

## 2018-07-16 DIAGNOSIS — I10 Essential (primary) hypertension: Secondary | ICD-10-CM | POA: Diagnosis not present

## 2018-07-16 DIAGNOSIS — Z888 Allergy status to other drugs, medicaments and biological substances status: Secondary | ICD-10-CM | POA: Diagnosis not present

## 2018-07-16 DIAGNOSIS — Z91013 Allergy to seafood: Secondary | ICD-10-CM | POA: Diagnosis not present

## 2018-07-16 DIAGNOSIS — I499 Cardiac arrhythmia, unspecified: Secondary | ICD-10-CM | POA: Diagnosis not present

## 2018-07-16 DIAGNOSIS — Z87442 Personal history of urinary calculi: Secondary | ICD-10-CM | POA: Diagnosis not present

## 2018-07-16 DIAGNOSIS — J45909 Unspecified asthma, uncomplicated: Secondary | ICD-10-CM | POA: Insufficient documentation

## 2018-07-16 DIAGNOSIS — Z881 Allergy status to other antibiotic agents status: Secondary | ICD-10-CM | POA: Insufficient documentation

## 2018-07-16 DIAGNOSIS — E669 Obesity, unspecified: Secondary | ICD-10-CM | POA: Insufficient documentation

## 2018-07-16 HISTORY — PX: EXTRACORPOREAL SHOCK WAVE LITHOTRIPSY: SHX1557

## 2018-07-16 LAB — POCT PREGNANCY, URINE: Preg Test, Ur: NEGATIVE

## 2018-07-16 SURGERY — LITHOTRIPSY, ESWL
Anesthesia: Moderate Sedation | Laterality: Left

## 2018-07-16 MED ORDER — CIPROFLOXACIN HCL 500 MG PO TABS
500.0000 mg | ORAL_TABLET | ORAL | Status: AC
  Administered 2018-07-16: 500 mg via ORAL

## 2018-07-16 MED ORDER — ONDANSETRON HCL 2 MG/ML IV SOLN: 4.0000 mg | Freq: Once | INTRAVENOUS | Status: DC | PRN

## 2018-07-16 MED ORDER — TAMSULOSIN HCL 0.4 MG PO CAPS: 0.4000 mg | ORAL_CAPSULE | Freq: Every day | ORAL | 0 refills | Status: DC

## 2018-07-16 MED ORDER — ONDANSETRON HCL 4 MG/2ML IJ SOLN
4.0000 mg | Freq: Once | INTRAMUSCULAR | Status: AC | PRN
  Administered 2018-07-16: 4 mg via INTRAVENOUS

## 2018-07-16 MED ORDER — DIAZEPAM 5 MG PO TABS
ORAL_TABLET | ORAL | Status: AC
  Filled 2018-07-16: qty 2

## 2018-07-16 MED ORDER — ONDANSETRON HCL 4 MG/2ML IJ SOLN
INTRAMUSCULAR | Status: AC
  Administered 2018-07-16: 4 mg via INTRAVENOUS
  Filled 2018-07-16: qty 2

## 2018-07-16 MED ORDER — SODIUM CHLORIDE 0.9 % IV SOLN: INTRAVENOUS | Status: DC

## 2018-07-16 MED ORDER — HYDROCODONE-ACETAMINOPHEN 5-325 MG PO TABS: 1.0000 | ORAL_TABLET | Freq: Four times a day (QID) | ORAL | 0 refills | Status: DC | PRN

## 2018-07-16 MED ORDER — DIPHENHYDRAMINE HCL 25 MG PO CAPS
25.0000 mg | ORAL_CAPSULE | ORAL | Status: AC
  Administered 2018-07-16: 25 mg via ORAL

## 2018-07-16 MED ORDER — CIPROFLOXACIN HCL 500 MG PO TABS
ORAL_TABLET | ORAL | Status: AC
  Administered 2018-07-16: 08:00:00 500 mg via ORAL
  Filled 2018-07-16: qty 1

## 2018-07-16 MED ORDER — DIPHENHYDRAMINE HCL 25 MG PO CAPS
ORAL_CAPSULE | ORAL | Status: AC
  Filled 2018-07-16: qty 1

## 2018-07-16 MED ORDER — DIAZEPAM 5 MG PO TABS
10.0000 mg | ORAL_TABLET | ORAL | Status: AC
  Administered 2018-07-16: 10 mg via ORAL

## 2018-07-16 NOTE — Discharge Instructions (Signed)
See [facility] discharge instructions in chart.  AMBULATORY SURGERY  DISCHARGE INSTRUCTIONS   1) The drugs that you were given will stay in your system until tomorrow so for the next 24 hours you should not:  A) Drive an automobile B) Make any legal decisions C) Drink any alcoholic beverage   2) You may resume regular meals tomorrow.  Today it is better to start with liquids and gradually work up to solid foods.  You may eat anything you prefer, but it is better to start with liquids, then soup and crackers, and gradually work up to solid foods.   3) Please notify your doctor immediately if you have any unusual bleeding, trouble breathing, redness and pain at the surgery site, drainage, fever, or pain not relieved by medication.    4) Additional Instructions:        Please contact your physician with any problems or Same Day Surgery at [phone], Monday through Friday 6 am to 4 pm, or [place] [facility] number at [phone].  

## 2018-07-16 NOTE — Interval H&P Note (Signed)
History and Physical Interval Note:  07/16/2018 9:52 AM  Victoria Booth  has presented today for surgery, with the diagnosis of Left Nephrolithiasis.  The various methods of treatment have been discussed with the patient and family. After consideration of risks, benefits and other options for treatment, the patient has consented to  Procedure(s): EXTRACORPOREAL SHOCK WAVE LITHOTRIPSY (ESWL) (Left) as a surgical intervention.  The patient's history has been reviewed, patient examined, no change in status, stable for surgery.  I have reviewed the patient's chart and labs.  Questions were answered to the patient's satisfaction.    RRR CTAB  Vanna Scotland

## 2018-07-17 ENCOUNTER — Encounter: Payer: Self-pay | Admitting: Urology

## 2018-07-31 ENCOUNTER — Telehealth: Payer: Self-pay | Admitting: Internal Medicine

## 2018-07-31 NOTE — Telephone Encounter (Signed)
Returned call to patient. Has appt with PCP 08/04/2018. Missed call may have been an appt reminder. Confirmed patient will be at this appt. Hubbard Hartshorn, RN, BSN

## 2018-07-31 NOTE — Telephone Encounter (Signed)
Pt missed call yesterday pls contact pt [phone]

## 2018-08-01 NOTE — Telephone Encounter (Signed)
Thank you :)

## 2018-08-03 NOTE — Progress Notes (Signed)
   CC: Follow up of abdominal pain   HPI:  [name] is a 38 y.o. with essential htn, chronic asthma, migraine disorder, ibs, pcos, mdd who presnets for follow up. Please see problem based charting for evaluation, assessment, and plan.  Past Medical History:  Diagnosis Date  . Anemia   . Asthma   . Hypertension   . LUQ abdominal pain 10/16/2017  . Microcytic anemia 12/01/2014  . Obesity    Review of Systems:    Review of Systems  Constitutional: Positive for weight loss.  Cardiovascular: Negative for chest pain.  Gastrointestinal: Negative for abdominal pain, nausea and vomiting.  Neurological: Positive for headaches. Negative for dizziness.  Psychiatric/Behavioral: Negative for depression.   Physical Exam:  Vitals:   08/04/18 1605 08/04/18 1640  BP: 138/83 138/81  Pulse: 86 84  Temp: 98.4 F (36.9 C)   TempSrc: Oral   SpO2: 98%   Weight: 291 lb (132 kg)   Height: 5\' 7"  (1.702 m)    Physical Exam  Constitutional: Appears well-developed and well-nourished. No distress.  HENT:  Head: Normocephalic and atraumatic.  Eyes: Conjunctivae are normal.  Cardiovascular: Normal rate, regular rhythm and normal heart sounds.  Respiratory: Effort normal and breath sounds normal. No respiratory distress. No wheezes.  GI: Soft. Bowel sounds are normal. No distension. There is no tenderness.  Musculoskeletal: No edema.  Neurological: Is alert.  Skin: Not diaphoretic. No erythema.  Psychiatric: Normal mood and affect. Behavior is normal. Judgment and thought content normal.    Assessment & Plan:   See Encounters Tab for problem based charting.  Patient discussed with Dr. Angelia Booth

## 2018-08-04 ENCOUNTER — Ambulatory Visit
Admission: RE | Admit: 2018-08-04 | Discharge: 2018-08-04 | Disposition: A | Discharge status: Home / Self Care | Payer: BC Managed Care – PPO | Source: Ambulatory Visit | Attending: Urology | Admitting: Urology

## 2018-08-04 ENCOUNTER — Ambulatory Visit (INDEPENDENT_AMBULATORY_CARE_PROVIDER_SITE_OTHER): Payer: BC Managed Care – PPO | Admitting: Urology

## 2018-08-04 ENCOUNTER — Other Ambulatory Visit: Payer: Self-pay

## 2018-08-04 ENCOUNTER — Ambulatory Visit (INDEPENDENT_AMBULATORY_CARE_PROVIDER_SITE_OTHER): Payer: BC Managed Care – PPO | Admitting: Internal Medicine

## 2018-08-04 ENCOUNTER — Encounter: Payer: Self-pay | Admitting: Internal Medicine

## 2018-08-04 ENCOUNTER — Encounter: Payer: Self-pay | Admitting: Urology

## 2018-08-04 VITALS — BP 162/107 | HR 73 | Ht 67.0 in | Wt 287.0 lb

## 2018-08-04 VITALS — BP 138/81 | HR 84 | Temp 98.4°F | Ht 67.0 in | Wt 291.0 lb

## 2018-08-04 DIAGNOSIS — F329 Major depressive disorder, single episode, unspecified: Secondary | ICD-10-CM

## 2018-08-04 DIAGNOSIS — R634 Abnormal weight loss: Secondary | ICD-10-CM

## 2018-08-04 DIAGNOSIS — N200 Calculus of kidney: Secondary | ICD-10-CM

## 2018-08-04 DIAGNOSIS — Z09 Encounter for follow-up examination after completed treatment for conditions other than malignant neoplasm: Secondary | ICD-10-CM

## 2018-08-04 DIAGNOSIS — J45909 Unspecified asthma, uncomplicated: Secondary | ICD-10-CM

## 2018-08-04 DIAGNOSIS — E559 Vitamin D deficiency, unspecified: Secondary | ICD-10-CM

## 2018-08-04 DIAGNOSIS — N201 Calculus of ureter: Secondary | ICD-10-CM | POA: Diagnosis not present

## 2018-08-04 DIAGNOSIS — D509 Iron deficiency anemia, unspecified: Secondary | ICD-10-CM

## 2018-08-04 DIAGNOSIS — Z79899 Other long term (current) drug therapy: Secondary | ICD-10-CM

## 2018-08-04 DIAGNOSIS — I10 Essential (primary) hypertension: Secondary | ICD-10-CM

## 2018-08-04 DIAGNOSIS — K589 Irritable bowel syndrome without diarrhea: Secondary | ICD-10-CM

## 2018-08-04 DIAGNOSIS — K219 Gastro-esophageal reflux disease without esophagitis: Secondary | ICD-10-CM

## 2018-08-04 DIAGNOSIS — E282 Polycystic ovarian syndrome: Secondary | ICD-10-CM

## 2018-08-04 DIAGNOSIS — G43909 Migraine, unspecified, not intractable, without status migrainosus: Secondary | ICD-10-CM

## 2018-08-04 DIAGNOSIS — Z6841 Body Mass Index (BMI) 40.0 and over, adult: Secondary | ICD-10-CM

## 2018-08-04 DIAGNOSIS — R1032 Left lower quadrant pain: Secondary | ICD-10-CM

## 2018-08-04 DIAGNOSIS — Z87442 Personal history of urinary calculi: Secondary | ICD-10-CM

## 2018-08-04 NOTE — Assessment & Plan Note (Signed)
The patient states that she has continued to have acid reflux. She has used the medication first thing in the morning and in the evening. She mentions that she always takes the medication approximately 31min-1 hr prior to eating.   The patient has been on pantoprazole 40mg  once daily for a few months and then was increased to twice daily which the patient also failed therapy to. The patient has stopped eating all fried foods and states that she is trying to avoid other foods that may cause acid reflux.   -due to the patient failing therapy to the above mentioned interventions, will refer the patient now to gastroenterology for further studies. May need biopsy of esophagus or ph monitoring.

## 2018-08-04 NOTE — Assessment & Plan Note (Signed)
Patient has a longstanding (1.5 year) history left lower quadrant abdominal pain.  Colonoscopy and upper endoscopy done in September 2019 showed normal findings except for an area of inflammation in the ileum which showed benign pathology.  The patient's pain was thought to be due to nephrolithiasis and urology referral was made.  Shockwave lithotripsy for 6 x 9 mm left renal calculus done by Dr. Erlene Quan on 07/16/2018.  KUB done 08/03/2018 no longer shows left renal calculus.  Patient's renal calculus was found to be a calcium oxalate stone.  Assessment and plan  Reminded patient to drink plenty of water -We will need follow-up KUB in 6 months -Follow-up renal ultrasound scheduled

## 2018-08-04 NOTE — Patient Instructions (Signed)
It was a pleasure to see you today Victoria Booth. Please make the following changes:  -Please make sure to take all of your medications  -I have checked iron studies and vitamin d at this visit  -Please follow up with gastroenterology regarding reflux issue, continue to use twice daily pantoprazole in meanwhile   If you have any questions or concerns, please call our clinic at [phone] between 9am-5pm and after hours call [phone] and ask for the internal medicine resident on call. If you feel you are having a medical emergency please call 911.   Thank you, we look forward to help you remain healthy!  [name], MD Internal Medicine PGY2     Kidney Stones  Kidney stones (urolithiasis) are solid, rock-like deposits that form inside of the organs that make urine (kidneys). A kidney stone may form in a kidney and move into the bladder, where it can cause intense pain and block the flow of urine. Kidney stones are created when high levels of certain minerals are found in the urine. They are usually passed through urination, but in some cases, medical treatment may be needed to remove them. What are the causes? Kidney stones may be caused by:  A condition in which certain glands produce too much parathyroid hormone (primary hyperparathyroidism), which causes too much calcium buildup in the blood.  Buildup of uric acid crystals in the bladder (hyperuricosuria). Uric acid is a chemical that the body produces when you eat certain foods. It usually exits the body in the urine.  Narrowing (stricture) of one or both of the tubes that drain urine from the kidneys to the bladder (ureters).  A kidney blockage that is present at birth (congenital obstruction).  Past surgery on the kidney or the ureters, such as gastric bypass surgery. What increases the risk? The following factors make you more likely to develop kidney stones:  Having had a kidney stone in the past.  Having a family  history of kidney stones.  Not drinking enough water.  Eating a diet that is high in protein, salt (sodium), or sugar.  Being overweight or obese. What are the signs or symptoms? Symptoms of a kidney stone may include:  Nausea.  Vomiting.  Blood in the urine (hematuria).  Pain in the side of the abdomen, right below the ribs (flank pain). Pain usually spreads (radiates) to the groin.  Needing to urinate frequently or urgently. How is this diagnosed? This condition may be diagnosed based on:  Your medical history.  A physical exam.  Blood tests.  Urine tests.  CT scan.  Abdominal X-ray.  A procedure to examine the inside of the bladder (cystoscopy). How is this treated? Treatment for kidney stones depends on the size, location, and makeup of the stones. Treatment may involve:  Analyzing your urine before and after you pass the stone through urination.  Being monitored at the hospital until you pass the stone through urination.  Increasing your fluid intake and decreasing the amount of calcium and protein in your diet.  A procedure to break up kidney stones in the bladder using: ? A focused beam of light (laser therapy). ? Shock waves (extracorporeal shock wave lithotripsy).  Surgery to remove kidney stones. This may be needed if you have severe pain or have stones that block your urinary tract. Follow these instructions at home: Eating and drinking  Drink enough fluid to keep your urine clear or pale yellow. This will help you to pass the kidney stone.  If directed, change your diet. This may include: ? Limiting how much sodium you eat. ? Eating more fruits and vegetables. ? Limiting how much meat, poultry, fish, and eggs you eat.  Follow instructions from your health care provider about eating or drinking restrictions. General instructions  Collect urine samples as told by your health care provider. You may need to collect a urine sample: ? 24 hours  after you pass the stone. ? 8-12 weeks after passing the kidney stone, and every 6-12 months after that.  Strain your urine every time you urinate, for as long as directed. Use the strainer that your health care provider recommends.  Do not throw out the kidney stone after passing it. Keep the stone so it can be tested by your health care provider. Testing the makeup of your kidney stone may help prevent you from getting kidney stones in the future.  Take over-the-counter and prescription medicines only as told by your health care provider.  Keep all follow-up visits as told by your health care provider. This is important. You may need follow-up X-rays or ultrasounds to make sure that your stone has passed. How is this prevented? To prevent another kidney stone:  Drink enough fluid to keep your urine clear or pale yellow. This is the best way to prevent kidney stones.  Eat a healthy diet and follow recommendations from your health care provider about foods to avoid. You may be instructed to eat a low-protein diet. Recommendations vary depending on the type of kidney stone that you have.  Maintain a healthy weight. Contact a health care provider if:  You have pain that gets worse or does not get better with medicine. Get help right away if:  You have a fever or chills.  You develop severe pain.  You develop new abdominal pain.  You faint.  You are unable to urinate. This information is not intended to replace advice given to you by your health care provider. Make sure you discuss any questions you have with your health care provider. Document Released: 01/28/2005 Document Revised: 07/11/2016 Document Reviewed: 07/14/2015 Elsevier Interactive Patient Education  2019 [employer].

## 2018-08-04 NOTE — Assessment & Plan Note (Signed)
The patient states that she has been feeling weak and with low energy. Given that the patient is an african [place] she is at higher risk for vitamin d deficiency, also she works indoors mostly.   -Will check vitamin d level and recommend supplementation if needed.

## 2018-08-04 NOTE — Progress Notes (Signed)
08/04/2018 9:34 AM   [name] 1981/01/11 [id number]  Referring provider: Lorenso Courier, MD [address] [place],  [place] [address]  Chief Complaint  Patient presents with  . Nephrolithiasis    HPI: 38 year old female presents for postop follow-up.  She underwent shockwave lithotripsy of a 6 x 9 mm left renal calculus by Dr. Apolinar Junes on 07/16/2018.  She had mild flank pain for 2 days after the procedure but did not pass a stone fragments to her knowledge.  KUB performed today was reviewed and the previously seen left renal calculus is no longer visualized.  No fragments identified along the expected course of the ureter.   PMH: Past Medical History:  Diagnosis Date  . Anemia   . Asthma   . Hypertension   . LUQ abdominal pain 10/16/2017  . Obesity     Surgical History: Past Surgical History:  Procedure Laterality Date  . EXTRACORPOREAL SHOCK WAVE LITHOTRIPSY Left 07/16/2018   Procedure: EXTRACORPOREAL SHOCK WAVE LITHOTRIPSY (ESWL);  Surgeon: Vanna Scotland, MD;  Location: [facility] ORS;  Service: Urology;  Laterality: Left;  . TOENAIL EXCISION      Home Medications:  Allergies as of 08/04/2018      Reactions   Penicillins Shortness Of Breath, Rash, Other (See Comments)   Has patient had a PCN reaction causing immediate rash, facial/tongue/throat swelling, SOB or lightheadedness with hypotension:yes Has patient had a PCN reaction causing severe rash involving mucus membranes or skin necrosis: no Has patient had a PCN reaction that required hospitalization : no Has patient had a PCN reaction occurring within the last 10 years:no  If all of the above answers are "NO", then may proceed with Cephalosporin use.   Shellfish Allergy Swelling   Sulfa Antibiotics Hives   Keflex [cephalexin] Itching, Other (See Comments)   Itching hands and legs   Lisinopril Cough   Iodine Rash      Medication List       Accurate as of August 04, 2018  9:34 AM. If you have any questions, ask  your nurse or doctor.        amLODipine 10 MG tablet Commonly known as: NORVASC TAKE 1 TABLET BY MOUTH EVERY DAY   DM-GG & DM-APAP-CPM 10-20 &15-200-2 MG Misc Commonly known as: Coricidin HBP Day/Night Cold Take 1-2 capsules by mouth 2 (two) times daily as needed.   HYDROcodone-acetaminophen 5-325 MG tablet Commonly known as: NORCO/VICODIN Take 1-2 tablets by mouth every 6 (six) hours as needed for moderate pain.   ondansetron 4 MG disintegrating tablet Commonly known as: Zofran ODT Take 1 tablet (4 mg total) by mouth every 8 (eight) hours as needed for nausea or vomiting.   pantoprazole 40 MG tablet Commonly known as: PROTONIX TAKE ONE TABLET BY MOUTH TWICE DAILY   promethazine 25 MG tablet Commonly known as: PHENERGAN Take 1 tablet (25 mg total) by mouth every 6 (six) hours as needed for nausea or vomiting.   spironolactone 25 MG tablet Commonly known as: ALDACTONE TAKE 1 TABLET (25 MG TOTAL) BY MOUTH DAILY FOR 90 DOSES.   tamsulosin 0.4 MG Caps capsule Commonly known as: Flomax Take 1 capsule (0.4 mg total) by mouth daily.       Allergies:  Allergies  Allergen Reactions  . Penicillins Shortness Of Breath, Rash and Other (See Comments)    Has patient had a PCN reaction causing immediate rash, facial/tongue/throat swelling, SOB or lightheadedness with hypotension:yes Has patient had a PCN reaction causing severe rash involving mucus  membranes or skin necrosis: no Has patient had a PCN reaction that required hospitalization : no Has patient had a PCN reaction occurring within the last 10 years:no  If all of the above answers are "NO", then may proceed with Cephalosporin use.   . Shellfish Allergy Swelling  . Sulfa Antibiotics Hives  . Keflex [Cephalexin] Itching and Other (See Comments)    Itching hands and legs  . Lisinopril Cough  . Iodine Rash    Family History: Family History  Problem Relation Age of Onset  . Diabetes Mother   . Hypertension Mother    . Heart attack Mother        2016- 59 yrs  . Breast cancer Maternal Grandmother   . Hypertension Father   . Colon cancer Neg Hx   . Stomach cancer Neg Hx   . Pancreatic cancer Neg Hx     Social History:  reports that she quit smoking about 3 years ago. Her smoking use included cigarettes. She smoked 0.10 packs per day. She has never used smokeless tobacco. She reports that she does not drink alcohol or use drugs.  ROS: UROLOGY Frequent Urination?: No Hard to postpone urination?: No Burning/pain with urination?: No Get up at night to urinate?: No Leakage of urine?: Yes Urine stream starts and stops?: No Trouble starting stream?: No Do you have to strain to urinate?: No Blood in urine?: No Urinary tract infection?: No Sexually transmitted disease?: No Injury to kidneys or bladder?: No Painful intercourse?: No Weak stream?: No Currently pregnant?: No Vaginal bleeding?: No Last menstrual period?: n  Gastrointestinal Nausea?: Yes Vomiting?: Yes Indigestion/heartburn?: Yes Diarrhea?: No Constipation?: No  Constitutional Fever: No Night sweats?: No Weight loss?: Yes Fatigue?: No  Skin Skin rash/lesions?: No Itching?: No  Eyes Blurred vision?: No Double vision?: No  Ears/Nose/Throat Sore throat?: No Sinus problems?: No  Hematologic/Lymphatic Swollen glands?: No Easy bruising?: No  Cardiovascular Leg swelling?: No Chest pain?: No  Respiratory Cough?: No Shortness of breath?: No  Endocrine Excessive thirst?: No  Musculoskeletal Back pain?: No Joint pain?: No  Neurological Headaches?: No Dizziness?: No  Psychologic Depression?: No Anxiety?: No  Physical Exam: BP (!) 162/107 (BP Location: Left Arm, Cuff Size: Large)   Pulse 73   Ht 5\' 7"  (1.702 m)   Wt 287 lb (130.2 kg)   LMP 07/28/2018   BMI 44.95 kg/m   Constitutional:  Alert and oriented, No acute distress.   Assessment & Plan:   Good result status post shockwave lithotripsy.  She  was unable to collect any stone fragments but based on appearance this would be a calcium oxalate stone.  We discussed general stone prevention guidelines or pursuing a metabolic evaluation.  She is a first-time stone former.  She has elected the former.  It was recommended she increase her water intake to keep urine output greater than 2 L per day.  Ten 10 ounce glasses of water per day is generally enough to produce this output.  Oxalate moderation was discussed and she was provided literature on high oxalate foods and beverages.  Avoidance of salty foods and added salt was discussed as well as avoidance of excessive intake of animal protein.  Increased intake of potassium rich citrus products was recommended. She was provided literature on stone prevention.  Will schedule a follow-up renal ultrasound and she will be notified with results.  Follow-up 6 months with a KUB.   Abbie Sons, MD  [facility] [address], [address] [place], [place] [address] [phone]  

## 2018-08-04 NOTE — Assessment & Plan Note (Signed)
The patient's blood pressure during this visit was 138/81, 86. The patient is currently taking spironolactone 25mg  qd and amlodipine 10mg  qd . His last blood pressure visits are   BP Readings from Last 3 Encounters:  08/04/18 138/81  08/04/18 (!) 162/107  07/16/18 (!) 143/98   Assessment and Plan Recommended the patient to continue current amlodipine 10mg  qd and spironolactone 25mg  qd.

## 2018-08-04 NOTE — Assessment & Plan Note (Signed)
Patient stated that she has been having regular periods daily and feels that the flow is increased to prior. As the patient has also been feeling weak, will check iron studies.   -check iron, ferritin, tibc

## 2018-08-05 LAB — IRON AND TIBC
Iron Saturation: 9 % — CL (ref 15–55)
Iron: 23 ug/dL — ABNORMAL LOW (ref 27–159)
Total Iron Binding Capacity: 270 ug/dL (ref 250–450)
UIBC: 247 ug/dL (ref 131–425)

## 2018-08-05 LAB — VITAMIN D 25 HYDROXY (VIT D DEFICIENCY, FRACTURES): Vit D, 25-Hydroxy: 13.4 ng/mL — ABNORMAL LOW (ref 30.0–100.0)

## 2018-08-05 LAB — FERRITIN: Ferritin: 214 ng/mL — ABNORMAL HIGH (ref 15–150)

## 2018-08-05 NOTE — Progress Notes (Signed)
Internal Medicine Clinic Attending  Case discussed with Dr. Chundi at the time of the visit.  We reviewed the resident's history and exam and pertinent patient test results.  I agree with the assessment, diagnosis, and plan of care documented in the resident's note. 

## 2018-08-06 ENCOUNTER — Other Ambulatory Visit: Payer: Self-pay | Admitting: Urology

## 2018-08-06 DIAGNOSIS — N201 Calculus of ureter: Secondary | ICD-10-CM

## 2018-08-07 ENCOUNTER — Other Ambulatory Visit: Payer: Self-pay

## 2018-08-07 ENCOUNTER — Telehealth: Payer: Self-pay

## 2018-08-07 DIAGNOSIS — N200 Calculus of kidney: Secondary | ICD-10-CM

## 2018-08-07 NOTE — Telephone Encounter (Signed)
-----   Message from Abbie Sons, MD sent at 08/06/2018  7:48 AM EDT ----- Radiology report received on KUB and she may have a stone fragment in the left ureter.  Would recommend her ultrasound be done within 1 to 2 weeks in addition to a repeat KUB.  Will call with results

## 2018-08-07 NOTE — Telephone Encounter (Signed)
Advised patient of results. Patient verbalized understanding. Forwarded to [employer] for PA for [place].

## 2018-08-25 ENCOUNTER — Ambulatory Visit
Admission: RE | Admit: 2018-08-25 | Discharge: 2018-08-25 | Disposition: A | Discharge status: Home / Self Care | Payer: BC Managed Care – PPO | Source: Ambulatory Visit | Attending: Urology | Admitting: Urology

## 2018-08-25 ENCOUNTER — Other Ambulatory Visit: Payer: Self-pay

## 2018-08-25 DIAGNOSIS — N200 Calculus of kidney: Secondary | ICD-10-CM | POA: Diagnosis not present

## 2018-08-26 ENCOUNTER — Ambulatory Visit (HOSPITAL_COMMUNITY)
Admission: EM | Admit: 2018-08-26 | Discharge: 2018-08-26 | Disposition: A | Discharge status: Home / Self Care | Payer: BC Managed Care – PPO | Attending: Family Medicine | Admitting: Family Medicine

## 2018-08-26 ENCOUNTER — Telehealth: Payer: Self-pay

## 2018-08-26 ENCOUNTER — Other Ambulatory Visit: Payer: Self-pay

## 2018-08-26 ENCOUNTER — Other Ambulatory Visit: Payer: Self-pay | Admitting: Urology

## 2018-08-26 ENCOUNTER — Encounter (HOSPITAL_COMMUNITY): Payer: Self-pay

## 2018-08-26 DIAGNOSIS — Z87442 Personal history of urinary calculi: Secondary | ICD-10-CM

## 2018-08-26 DIAGNOSIS — J029 Acute pharyngitis, unspecified: Secondary | ICD-10-CM | POA: Diagnosis not present

## 2018-08-26 LAB — POCT RAPID STREP A: Streptococcus, Group A Screen (Direct): NEGATIVE

## 2018-08-26 MED ORDER — LIDOCAINE VISCOUS HCL 2 % MT SOLN
15.0000 mL | Freq: Once | OROMUCOSAL | Status: AC
  Administered 2018-08-26: 15 mL via ORAL

## 2018-08-26 MED ORDER — LIDOCAINE VISCOUS HCL 2 % MT SOLN
OROMUCOSAL | Status: AC
  Filled 2018-08-26: qty 15

## 2018-08-26 MED ORDER — CETIRIZINE HCL 10 MG PO CAPS: 10.0000 mg | ORAL_CAPSULE | Freq: Every day | ORAL | 0 refills | Status: DC

## 2018-08-26 MED ORDER — IBUPROFEN 600 MG PO TABS: 600.0000 mg | ORAL_TABLET | Freq: Four times a day (QID) | ORAL | 0 refills | Status: DC | PRN

## 2018-08-26 MED ORDER — ALUM & MAG HYDROXIDE-SIMETH 200-200-20 MG/5ML PO SUSP
ORAL | Status: AC
  Filled 2018-08-26: qty 30

## 2018-08-26 MED ORDER — ALUM & MAG HYDROXIDE-SIMETH 200-200-20 MG/5ML PO SUSP
30.0000 mL | Freq: Once | ORAL | Status: AC
  Administered 2018-08-26: 30 mL via ORAL

## 2018-08-26 NOTE — Telephone Encounter (Signed)
-----   Message from Abbie Sons, MD sent at 08/26/2018  3:12 PM EDT ----- Renal ultrasound showed no evidence of kidney blockage or stones.  Recommend a repeat KUB to see if the previously noted calcification is still present.  Order was entered.  Will call with results.

## 2018-08-26 NOTE — Discharge Instructions (Signed)
Sore Throat  Your rapid strep tested Negative today. We will send for a culture and call in about 2 days if results are positive. For now we will treat your sore throat as a virus with symptom management.   Please continue Tylenol or Ibuprofen for fever and pain. May try salt water gargles, cepacol lozenges, throat spray, or OTC cold relief medicine for throat discomfort. If you also have congestion take a daily anti-histamine like Zyrtec, Claritin, and a oral decongestant to help with post nasal drip that may be irritating your throat.   Stay hydrated and drink plenty of fluids to keep your throat coated relieve irritation.   May add in additional reflux medicines- maalox, tums, pepcid

## 2018-08-26 NOTE — Telephone Encounter (Signed)
Patient notified

## 2018-08-26 NOTE — ED Triage Notes (Signed)
Pt cc states she has a burning sensation when she swallow. Pt states she has white patches on her tongue.

## 2018-08-27 ENCOUNTER — Other Ambulatory Visit: Payer: Self-pay | Admitting: Internal Medicine

## 2018-08-27 MED ORDER — VITAMIN D (ERGOCALCIFEROL) 1.25 MG (50000 UNIT) PO CAPS: 50000.0000 [IU] | ORAL_CAPSULE | ORAL | 0 refills | Status: AC

## 2018-08-27 MED ORDER — FERROUS SULFATE 325 (65 FE) MG PO TABS: 325.0000 mg | ORAL_TABLET | ORAL | 0 refills | Status: DC

## 2018-08-27 NOTE — ED Provider Notes (Signed)
[facility]    CSN: [id number] Arrival date & time: 08/26/18  1658      History   Chief Complaint Chief Complaint  Patient presents with  . burning in her throat.    HPI Victoria Booth is a 38 y.o. female history of asthma, hypertension, GERD, IBS, PCOS, presenting today for evaluation of burning sensation in her throat.  Patient states that she has had discomfort with swallowing and feeling slightly swelling in her throat.  She has a burning sensation from her throat that goes down into her chest.  Feels similar to previous GERD symptoms.  Symptoms began Sunday night, and have persisted.  Earlier today she also noticed some white patches on her tongue her tongue is felt slightly irritated.  Denies any fevers.  Denies associated congestion or cough.  HPI  Past Medical History:  Diagnosis Date  . Anemia   . Asthma   . Hypertension   . LUQ abdominal pain 10/16/2017  . Microcytic anemia 12/01/2014  . Obesity     Patient Active Problem List   Diagnosis Date Noted  . Vitamin D deficiency 08/04/2018  . Nephrolithiasis 07/14/2018  . Functional dyspepsia 02/20/2018  . IBS (irritable colon syndrome) 02/20/2018  . Iron deficiency anemia 08/18/2017  . Acute pain of left shoulder 08/18/2017  . Insomnia 07/29/2017  . Left lower quadrant abdominal pain of unknown etiology 07/13/2017  . OSA (obstructive sleep apnea) 06/05/2017  . Major depressive disorder 03/21/2017  . Migraine 10/24/2016  . Hematuria 09/24/2016  . Prediabetes 08/21/2016  . Allergic rhinitis 06/18/2016  . Left flank pain 06/05/2016  . PCOS (polycystic ovarian syndrome) 04/05/2016  . Essential hypertension, benign 12/01/2014  . Asthma, chronic 12/01/2014  . Gastroesophageal reflux disease 12/01/2014  . Preventative health care 12/01/2014    Past Surgical History:  Procedure Laterality Date  . EXTRACORPOREAL SHOCK WAVE LITHOTRIPSY Left 07/16/2018   Procedure: EXTRACORPOREAL SHOCK WAVE LITHOTRIPSY  (ESWL);  Surgeon: Vanna ScotlandBrandon, Ashley, MD;  Location: [facility];  Service: Urology;  Laterality: Left;  . TOENAIL EXCISION      OB History   No obstetric history on file.      Home Medications    Prior to Admission medications   Medication Sig Start Date End Date Taking? Authorizing Provider  amLODipine (NORVASC) 10 MG tablet TAKE 1 TABLET BY MOUTH EVERY DAY 07/11/18   Chundi, Sherlyn LeesVahini, MD  Cetirizine HCl 10 MG CAPS Take 1 capsule (10 mg total) by mouth daily for 10 days. 08/26/18 09/05/18  Laquesha Holcomb C, PA-C  DM-GG & DM-APAP-CPM (CORICIDIN HBP DAY/NIGHT COLD) 10-20 &15-200-2 MG MISC Take 1-2 capsules by mouth 2 (two) times daily as needed. 05/20/18   Angelita InglesWinfrey, William B, MD  ferrous sulfate 325 (65 FE) MG tablet Take 1 tablet (325 mg total) by mouth every other day. 08/27/18 11/25/18  Lorenso Courierhundi, Vahini, MD  HYDROcodone-acetaminophen (NORCO/VICODIN) 5-325 MG tablet Take 1-2 tablets by mouth every 6 (six) hours as needed for moderate pain. 07/16/18   Vanna ScotlandBrandon, Ashley, MD  ibuprofen (ADVIL) 600 MG tablet Take 1 tablet (600 mg total) by mouth every 6 (six) hours as needed. 08/26/18   Tekia Waterbury C, PA-C  ondansetron (ZOFRAN ODT) 4 MG disintegrating tablet Take 1 tablet (4 mg total) by mouth every 8 (eight) hours as needed for nausea or vomiting. Patient not taking: Reported on 08/04/2018 07/12/18   Minna AntisPaduchowski, Kevin, MD  pantoprazole (PROTONIX) 40 MG tablet TAKE ONE TABLET BY MOUTH TWICE DAILY 05/18/18   Lorenso Courierhundi, Vahini, MD  promethazine (PHENERGAN) 25 MG tablet Take 1 tablet (25 mg total) by mouth every 6 (six) hours as needed for nausea or vomiting. 07/14/18   Stoioff, Verna CzechScott C, MD  spironolactone (ALDACTONE) 25 MG tablet TAKE 1 TABLET (25 MG TOTAL) BY MOUTH DAILY FOR 90 DOSES. 07/11/18 10/09/18  Lorenso Courierhundi, Vahini, MD  tamsulosin (FLOMAX) 0.4 MG CAPS capsule Take 1 capsule (0.4 mg total) by mouth daily. Patient not taking: Reported on 08/04/2018 07/16/18   Vanna ScotlandBrandon, Ashley, MD  Vitamin D, Ergocalciferol, (DRISDOL)  1.25 MG (50000 UT) CAPS capsule Take 1 capsule (50,000 Units total) by mouth every 7 (seven) days for 8 doses. 08/27/18 10/16/18  Lorenso Courierhundi, Vahini, MD    Family History Family History  Problem Relation Age of Onset  . Diabetes Mother   . Hypertension Mother   . Heart attack Mother        2016- 52 yrs  . Breast cancer Maternal Grandmother   . Hypertension Father   . Colon cancer Neg Hx   . Stomach cancer Neg Hx   . Pancreatic cancer Neg Hx     Social History Social History   Tobacco Use  . Smoking status: Former Smoker    Packs/day: 0.10    Types: Cigarettes    Quit date: 04/06/2015    Years since quitting: 3.3  . Smokeless tobacco: Never Used  Substance Use Topics  . Alcohol use: No    Alcohol/week: 0.0 standard drinks  . Drug use: No     Allergies   Penicillins, Shellfish allergy, Sulfa antibiotics, Keflex [cephalexin], Lisinopril, and Iodine   Review of Systems Review of Systems  Constitutional: Negative for activity change, appetite change, chills, fatigue and fever.  HENT: Positive for sore throat. Negative for congestion, ear pain, rhinorrhea, sinus pressure and trouble swallowing.   Eyes: Negative for discharge and redness.  Respiratory: Negative for cough, chest tightness and shortness of breath.   Cardiovascular: Positive for chest pain.  Gastrointestinal: Negative for abdominal pain, diarrhea, nausea and vomiting.  Musculoskeletal: Negative for myalgias.  Skin: Negative for rash.  Neurological: Negative for dizziness, light-headedness and headaches.     Physical Exam Triage Vital Signs ED Triage Vitals  Enc Vitals Group     BP 08/26/18 1830 (!) 147/80     Pulse Rate 08/26/18 1830 78     Resp 08/26/18 1830 18     Temp 08/26/18 1830 98 F (36.7 C)     Temp src --      SpO2 08/26/18 1830 100 %     Weight 08/26/18 1828 289 lb (131.1 kg)     Height --      Head Circumference --      Peak Flow --      Pain Score 08/26/18 1828 7     Pain Loc --       Pain Edu? --      Excl. in GC? --    No data found.  Updated Vital Signs BP (!) 147/80 (BP Location: Right Arm)   Pulse 78   Temp 98 F (36.7 C)   Resp 18   Wt 289 lb (131.1 kg)   LMP 07/28/2018   SpO2 100%   BMI 45.26 kg/m   Visual Acuity Right Eye Distance:   Left Eye Distance:   Bilateral Distance:    Right Eye Near:   Left Eye Near:    Bilateral Near:     Physical Exam Vitals signs and nursing note reviewed.  Constitutional:  General: She is not in acute distress.    Appearance: She is well-developed.  HENT:     Head: Normocephalic and atraumatic.     Ears:     Comments: Bilateral ears without tenderness to palpation of external auricle, tragus and mastoid, EAC's without erythema or swelling, TM's with good bony landmarks and cone of light. Non erythematous.     Nose:     Comments: Bilateral turbinates erythematous and swollen    Mouth/Throat:     Comments: Oral mucosa pink and moist, no tonsillar enlargement or exudate. Posterior pharynx patent and nonerythematous, no uvula deviation or swelling. Normal phonation. No significant erythema, no white patches noted on tongue or oral mucosa, tonsils not significantly enlarged erythematous Eyes:     Conjunctiva/sclera: Conjunctivae normal.  Neck:     Musculoskeletal: Neck supple.  Cardiovascular:     Rate and Rhythm: Normal rate and regular rhythm.     Heart sounds: No murmur.  Pulmonary:     Effort: Pulmonary effort is normal. No respiratory distress.     Breath sounds: Normal breath sounds.     Comments: Breathing comfortably at rest, CTABL, no wheezing, rales or other adventitious sounds auscultated Abdominal:     Palpations: Abdomen is soft.     Tenderness: There is no abdominal tenderness.  Skin:    General: Skin is warm and dry.  Neurological:     Mental Status: She is alert.      UC Treatments / Results  Labs (all labs ordered are listed, but only abnormal results are displayed) Labs  Reviewed  CULTURE, GROUP A STREP [facility])  POCT RAPID STREP A    EKG   Radiology [place] [facility]  Result Date: 08/26/2018 CLINICAL DATA:  Nephrolithiasis. EXAM: RENAL / URINARY TRACT ULTRASOUND COMPLETE COMPARISON:  CT scan of Jul 12, 2018.  Ultrasound of August 25, 2014. FINDINGS: Right Kidney: Renal measurements: 11.6 x 5.8 x 4.3 cm = volume: 115 mL . Echogenicity within normal limits. No mass or hydronephrosis visualized. Left Kidney: Renal measurements: 11.2 x 6.0 x 5.5 cm = volume: 193 mL. Echogenicity within normal limits. No mass or hydronephrosis visualized. Bladder: Appears normal for degree of bladder distention. IMPRESSION: Normal renal ultrasound. Electronically Signed   By: Lupita RaiderJames  Green Jr M.D.   On: 08/26/2018 10:25    Procedures Procedures (including critical care time)  Medications Ordered in UC Medications  alum & mag hydroxide-simeth (MAALOX/MYLANTA) 200-200-20 MG/5ML suspension 30 mL (30 mLs Oral Given 08/26/18 1904)    And  lidocaine (XYLOCAINE) 2 % viscous mouth solution 15 mL (15 mLs Oral Given 08/26/18 1904)  alum & mag hydroxide-simeth (MAALOX/MYLANTA) 200-200-20 MG/5ML suspension (has no administration in time range)  lidocaine (XYLOCAINE) 2 % viscous mouth solution (has no administration in time range)    Initial Impression / Assessment and Plan / UC Course  I have reviewed the triage vital signs and the nursing notes.  Pertinent labs & imaging results that were available during my care of the patient were reviewed by me and considered in my medical decision making (see chart for details).     GI cocktail provided to see if this would relieve burning sensation through throat and chest without relief.  Do not suspect cardiac etiology, patient feels this is less likely related to reflux.  She takes pantoprazole daily.  Strep test negative.  Possible drainage contributing to symptoms.  Daily allergy pill, and Tylenol and ibuprofen.  Continue reflux medicines.  Continue to  monitor,Discussed strict return  precautions. Patient verbalized understanding and is agreeable with plan.  Final Clinical Impressions(s) / UC Diagnoses   Final diagnoses:  Sore throat     Discharge Instructions     Sore Throat  Your rapid strep tested Negative today. We will send for a culture and call in about 2 days if results are positive. For now we will treat your sore throat as a virus with symptom management.   Please continue Tylenol or Ibuprofen for fever and pain. May try salt water gargles, cepacol lozenges, throat spray, or OTC cold relief medicine for throat discomfort. If you also have congestion take a daily anti-histamine like Zyrtec, Claritin, and a oral decongestant to help with post nasal drip that may be irritating your throat.   Stay hydrated and drink plenty of fluids to keep your throat coated relieve irritation.   May add in additional reflux medicines- maalox, tums, pepcid    ED Prescriptions    Medication Sig Dispense Auth. Provider   ibuprofen (ADVIL) 600 MG tablet Take 1 tablet (600 mg total) by mouth every 6 (six) hours as needed. 30 tablet Malanie Koloski C, PA-C   Cetirizine HCl 10 MG CAPS Take 1 capsule (10 mg total) by mouth daily for 10 days. 10 capsule Rosalynn Sergent C, PA-C     Controlled Substance Prescriptions [place] Controlled Substance Registry consulted? Not Applicable   Janith Lima, [place] 08/27/18 1011

## 2018-08-27 NOTE — Progress Notes (Addendum)
Given patient's vitamin d deficiency and mild iron deficiency anemia called patient and informed her that vitamin d 50000 units should be taken once a week for 8 weeks. I also told her to to take ferrous sulfate 65mg  elemental every other day for 3 months and will then re-assess both iron and vitamin d levels.

## 2018-08-29 LAB — CULTURE, GROUP A STREP (THRC)

## 2018-08-31 ENCOUNTER — Telehealth: Payer: Self-pay | Admitting: *Deleted

## 2018-08-31 NOTE — Telephone Encounter (Signed)
Call from pt - stated she received a call the pharmacy telling her she has meds to pick up. Stated she did not know what meds and why so this is why she's calling. Informed she has iron and an Vit D deficiency ; and vit d and ferrous sulfate wee ordered.      "Given patient's vitamin d deficiency and mild iron deficiency anemia called patient and informed her that vitamin d 50000 units should be taken once a week for 8 weeks. I also told her to to take ferrous sulfate 65mg  elemental every other day for 3 months and will then re-assess both iron and vitamin d levels."  She stated she cannot take iron tablets which is why she was getting iron infusions. I told I will sent her doctor her questions/concerns and have her to call her again.

## 2018-08-31 NOTE — Telephone Encounter (Signed)
I will speak to her, the patient's iron levels are not significantly low enough for her to be needing iron infusion

## 2018-09-15 ENCOUNTER — Telehealth: Payer: Self-pay

## 2018-09-15 NOTE — Telephone Encounter (Signed)
Please call pt back.

## 2018-09-21 NOTE — Progress Notes (Signed)
   CC: Hypertension follow up  HPI:  [name] is a 38 y.o. female with essential htn, chronic asthma, IBS, GERD, vitamin d and b12 deficiency who presents for follow up regarding chronic diseases. Please see problem based charting for evaluation, assessment, and plan.  Past Medical History:  Diagnosis Date  . Anemia   . Asthma   . Hypertension   . LUQ abdominal pain 10/16/2017  . Microcytic anemia 12/01/2014  . Obesity    Review of Systems:    Review of Systems  Constitutional: Negative for chills and fever.  Eyes: Negative for double vision.  Respiratory: Negative for cough.   Cardiovascular: Positive for palpitations.  Gastrointestinal: Negative for abdominal pain, nausea and vomiting.  Neurological: Negative for dizziness.  Psychiatric/Behavioral: Positive for depression. The patient is nervous/anxious and has insomnia.    Physical Exam:  Vitals:   09/22/18 1339 09/22/18 1341  BP:  (!) 141/82  Pulse:  84  Temp:  98.3 F (36.8 C)  TempSrc:  Oral  SpO2:  97%  Weight: 291 lb 3.2 oz (132.1 kg)   Height: 5\' 5"  (1.651 m)    Physical Exam  Constitutional: Appears well-developed and well-nourished. No distress.  HENT:  Head: Normocephalic and atraumatic.  Eyes: Conjunctivae are normal.  Cardiovascular: Normal rate, regular rhythm and normal heart sounds.  Respiratory: Effort normal and breath sounds normal. No respiratory distress. No wheezes.  GI: Soft. Bowel sounds are normal. No distension. Mild tenderness to palpation diffusely Musculoskeletal: No edema.  Neurological: Is alert.  Skin: Not diaphoretic. No erythema.  Psychiatric: Normal mood and affect. Behavior is normal. Judgment and thought content normal.  Anxious affect  Assessment & Plan:   See Encounters Tab for problem based charting.  Patient discussed with Dr. Evette Doffing

## 2018-09-22 ENCOUNTER — Other Ambulatory Visit: Payer: Self-pay

## 2018-09-22 ENCOUNTER — Ambulatory Visit (INDEPENDENT_AMBULATORY_CARE_PROVIDER_SITE_OTHER): Payer: BC Managed Care – PPO | Admitting: Internal Medicine

## 2018-09-22 ENCOUNTER — Encounter: Payer: Self-pay | Admitting: Internal Medicine

## 2018-09-22 VITALS — BP 141/82 | HR 84 | Temp 98.3°F | Ht 65.0 in | Wt 291.2 lb

## 2018-09-22 DIAGNOSIS — F339 Major depressive disorder, recurrent, unspecified: Secondary | ICD-10-CM

## 2018-09-22 DIAGNOSIS — E559 Vitamin D deficiency, unspecified: Secondary | ICD-10-CM

## 2018-09-22 DIAGNOSIS — K219 Gastro-esophageal reflux disease without esophagitis: Secondary | ICD-10-CM

## 2018-09-22 DIAGNOSIS — F321 Major depressive disorder, single episode, moderate: Secondary | ICD-10-CM

## 2018-09-22 DIAGNOSIS — G43909 Migraine, unspecified, not intractable, without status migrainosus: Secondary | ICD-10-CM

## 2018-09-22 DIAGNOSIS — I10 Essential (primary) hypertension: Secondary | ICD-10-CM | POA: Diagnosis not present

## 2018-09-22 DIAGNOSIS — Z79899 Other long term (current) drug therapy: Secondary | ICD-10-CM

## 2018-09-22 DIAGNOSIS — K589 Irritable bowel syndrome without diarrhea: Secondary | ICD-10-CM

## 2018-09-22 DIAGNOSIS — D509 Iron deficiency anemia, unspecified: Secondary | ICD-10-CM

## 2018-09-22 DIAGNOSIS — E538 Deficiency of other specified B group vitamins: Secondary | ICD-10-CM

## 2018-09-22 DIAGNOSIS — Z791 Long term (current) use of non-steroidal anti-inflammatories (NSAID): Secondary | ICD-10-CM

## 2018-09-22 DIAGNOSIS — R002 Palpitations: Secondary | ICD-10-CM

## 2018-09-22 DIAGNOSIS — G4700 Insomnia, unspecified: Secondary | ICD-10-CM

## 2018-09-22 DIAGNOSIS — F419 Anxiety disorder, unspecified: Secondary | ICD-10-CM

## 2018-09-22 DIAGNOSIS — J45909 Unspecified asthma, uncomplicated: Secondary | ICD-10-CM

## 2018-09-22 DIAGNOSIS — G43119 Migraine with aura, intractable, without status migrainosus: Secondary | ICD-10-CM

## 2018-09-22 MED ORDER — FERROUS SULFATE 325 (65 FE) MG PO TABS: 325.0000 mg | ORAL_TABLET | ORAL | 0 refills | Status: DC

## 2018-09-22 MED ORDER — PROPRANOLOL HCL 20 MG PO TABS: 40.0000 mg | ORAL_TABLET | Freq: Every day | ORAL | 0 refills | Status: DC

## 2018-09-22 MED ORDER — SERTRALINE HCL 50 MG PO TABS: 50.0000 mg | ORAL_TABLET | Freq: Every day | ORAL | 0 refills | Status: DC

## 2018-09-22 MED ORDER — SUMATRIPTAN SUCCINATE 50 MG PO TABS: 50.0000 mg | ORAL_TABLET | Freq: Once | ORAL | 0 refills | Status: DC | PRN

## 2018-09-22 NOTE — Patient Instructions (Addendum)
It was a pleasure to see you today Victoria Booth. Please make the following changes:  -please decrease the amount of bc powder that you are currently using  -I have prescribed sumatriptan 50mg  once as needed along with propranolol 40mg  daily for your headaches  -I have restarted you on sertraline 50mg  daily for your mood  -Please also follow with [name] -please follow up in 4 weeks  If you have any questions or concerns, please call our clinic at [phone] between 9am-5pm and after hours call [phone] and ask for the internal medicine resident on call. If you feel you are having a medical emergency please call 911.   Thank you, we look forward to help you remain healthy!  [name], MD Internal Medicine PGY3  Propranolol tablets What is this medicine? PROPRANOLOL (proe PRAN oh lole) is a beta-blocker. Beta-blockers reduce the workload on the heart and help it to beat more regularly. This medicine is used to treat high blood pressure, to control irregular heart rhythms (arrhythmias) and to relieve chest pain caused by angina. It may also be helpful after a heart attack. This medicine is also used to prevent migraine headaches, relieve uncontrollable shaking (tremors), and help certain problems related to the thyroid gland and adrenal gland. This medicine may be used for other purposes; ask your health care provider or pharmacist if you have questions. COMMON BRAND NAME(S): Inderal What should I tell my health care provider before I take this medicine? They need to know if you have any of these conditions:  circulation problems or blood vessel disease  diabetes  history of heart attack or heart disease, vasospastic angina  kidney disease  liver disease  lung or breathing disease, like asthma or emphysema  pheochromocytoma  slow heart rate  thyroid disease  an unusual or allergic reaction to propranolol, other beta-blockers, medicines, foods, dyes, or  preservatives  pregnant or trying to get pregnant  breast-feeding How should I use this medicine? Take this medicine by mouth with a glass of water. Follow the directions on the prescription label. Take your doses at regular intervals. Do not take your medicine more often than directed. Do not stop taking except on your the advice of your doctor or health care professional. Talk to your pediatrician regarding the use of this medicine in children. Special care may be needed. Overdosage: If you think you have taken too much of this medicine contact a poison control center or emergency room at once. NOTE: This medicine is only for you. Do not share this medicine with others. What if I miss a dose? If you miss a dose, take it as soon as you can. If it is almost time for your next dose, take only that dose. Do not take double or extra doses. What may interact with this medicine? Do not take this medicine with any of the following medications:  feverfew  phenothiazines like chlorpromazine, mesoridazine, prochlorperazine, thioridazine This medicine may also interact with the following medications:  aluminum hydroxide gel  antipyrine  antiviral medicines for HIV or AIDS  barbiturates like phenobarbital  certain medicines for blood pressure, heart disease, irregular heart beat  cimetidine  ciprofloxacin  diazepam  fluconazole  haloperidol  isoniazid  medicines for cholesterol like cholestyramine or colestipol  medicines for mental depression  medicines for migraine headache like almotriptan, eletriptan, frovatriptan, naratriptan, rizatriptan, sumatriptan, zolmitriptan  NSAIDs, medicines for pain and inflammation, like ibuprofen or naproxen  phenytoin  rifampin  teniposide  theophylline  thyroid  medicines  tolbutamide  warfarin  zileuton This list may not describe all possible interactions. Give your health care provider a list of all the medicines, herbs,  non-prescription drugs, or dietary supplements you use. Also tell them if you smoke, drink alcohol, or use illegal drugs. Some items may interact with your medicine. What should I watch for while using this medicine? Visit your doctor or health care professional for regular check ups. Check your blood pressure and pulse rate regularly. Ask your health care professional what your blood pressure and pulse rate should be, and when you should contact them. You may get drowsy or dizzy. Do not drive, use machinery, or do anything that needs mental alertness until you know how this drug affects you. Do not stand or sit up quickly, especially if you are an older patient. This reduces the risk of dizzy or fainting spells. Alcohol can make you more drowsy and dizzy. Avoid alcoholic drinks. This medicine may increase blood sugar. Ask your healthcare provider if changes in diet or medicines are needed if you have diabetes. Do not treat yourself for coughs, colds, or pain while you are taking this medicine without asking your doctor or health care professional for advice. Some ingredients may increase your blood pressure. What side effects may I notice from receiving this medicine? Side effects that you should report to your doctor or health care professional as soon as possible:  allergic reactions like skin rash, itching or hives, swelling of the face, lips, or tongue  breathing problems  cold hands or feet  difficulty sleeping, nightmares  dry peeling skin  hallucinations  muscle cramps or weakness   signs and symptoms of high blood sugar such as being more thirsty or hungry or having to urinate more than normal. You may also feel very tired or have blurry vision.  slow heart rate  swelling of the legs and ankles  vomiting Side effects that usually do not require medical attention (report to your doctor or health care professional if they continue or are bothersome):  change in sex drive or  performance  diarrhea  dry sore eyes  hair loss  nausea  weak or tired This list may not describe all possible side effects. Call your doctor for medical advice about side effects. You may report side effects to FDA at 1-800-FDA-1088. Where should I keep my medicine? Keep out of the reach of children. Store at room temperature between 15 and 30 degrees C (59 and 86 degrees F). Protect from light. Throw away any unused medicine after the expiration date. NOTE: This sheet is a summary. It may not cover all possible information. If you have questions about this medicine, talk to your doctor, pharmacist, or health care provider.  2020 Elsevier/Gold Standard (2017-11-19 08:41:59)

## 2018-09-23 NOTE — Assessment & Plan Note (Signed)
The patient has been having uncontrolled GERD symptoms for several years now.  She is failed therapy on pantoprazole 40 mg twice daily.  She also made changes to her diet which did not alleviate her symptoms.  EGD done in 2019 showed normal stomach and normal esophagus.  Colonoscopy in 2019 showed an ulcerated, granular, inflamed area in the terminal ileum.  Assessment and plan The patient continues to have GI symptoms of GERD on with continued chronic abdominal pain for which I have referred her to [facility] gastroenterology.  It is likely that the patient has a component of IBS due to her symptoms of anxiety, chronic abdominal pain, alternating diarrhea and constipation

## 2018-09-23 NOTE — Assessment & Plan Note (Addendum)
Patient states that she has been having more frequent uncontrolled headaches that are worsened with bright lights.  She states that it feels like the migraines that she has had in the past.  She has been taking 2 packets of BC powder every few hours for the past several days.  She was previously being prescribed sumatriptan and propanolol which she states she had some relief from.  However, the patient stopped taking these medications as she felt that her symptoms had resolved.   The patient does not have any focal weakness, sensory deficits.  Her cranial nerves are intact.  Assessment and plan  -Advised patient to avoid excessive use of NSAIDs -Restarted patient on sumatriptan 50 mg daily -Restarted patient on propranolol 40 mg daily for migraine prophylaxis.  Counseled patient on monitoring for lightheadedness, dizziness, bradycardia. -Return to clinic in 1 month for reevaluation of headache

## 2018-09-23 NOTE — Assessment & Plan Note (Signed)
The patient's blood pressure during this visit was 141/82. The patient is currently taking sprionolactone 25mg  qd,amlodipine 10mg  qd. His last blood pressure visits are   BP Readings from Last 3 Encounters:  09/22/18 (!) 141/82  08/26/18 (!) 147/80  08/04/18 138/81    The does not report dizziness, chest pain, sob.  Assessment and Plan Patient's blood pressure is well controlled in comparison to her previous readings.  Will continue current regimen.  She will also get propranolol 40 mg daily as migraine prophylaxis which should further help her blood pressure.

## 2018-09-23 NOTE — Progress Notes (Signed)
Internal Medicine Clinic Attending  Case discussed with Dr. Chundi at the time of the visit.  We reviewed the resident's history and exam and pertinent patient test results.  I agree with the assessment, diagnosis, and plan of care documented in the resident's note. 

## 2018-09-23 NOTE — Assessment & Plan Note (Signed)
Patient's last iron studies were done in June 2020 which showed iron 23, iron saturation 9, TIBC 270, ferritin 214.  Due to mildly low iron deficiency I started her on ferrous sulfate 325 every other day. Patient states that she just started taking her iron supplements approximately 2 weeks ago. She does not require an iron infusion at this time.  Patient has PCOS which causes her to have excessive bleeding at times and she is currently under work-up for GI illness which may explain patient's iron deficiency.  Assessment and plan  -Continue ferrous sulfate 325 mg every other day -We will recheck iron studies in 3 months (September 2020)

## 2018-09-23 NOTE — Assessment & Plan Note (Signed)
Patient states that she has been having severe anxiety and depressive symptoms month.  She states that she has been crying every day.  She is not been able to fall asleep till early morning.  She has tried sleep hygiene techniques as discussed in the past without any relief.  Patient states that she is extremely stressed out from her job duties and is getting overwhelmed.  The patient should be taking sertraline 50 mg daily but unfortunately she stopped taking it a few months ago.  The patient stated that she recalled the medication was helpful for her in the past.    Patient gad7 was 12 and PHQ9 was 10.  Assessment and plan  -Restarted sertraline 50 mg daily -Referred to integrated behavioral health -Advised to continue sleep hygiene techniques

## 2018-09-24 ENCOUNTER — Telehealth: Payer: Self-pay

## 2018-09-24 NOTE — Telephone Encounter (Signed)
Faxed FMLA to [employer] @  [phone], notified the patient the form has been faxed. Patient will pick up the original form.

## 2018-09-29 ENCOUNTER — Telehealth: Payer: Self-pay | Admitting: Licensed Clinical Social Worker

## 2018-09-29 NOTE — Telephone Encounter (Signed)
Patient was contacted due to a referral from her doctor. Patient agreed to services, and set up a phone session for 12;00 on 8/27.

## 2018-10-05 ENCOUNTER — Other Ambulatory Visit: Payer: Self-pay | Admitting: Internal Medicine

## 2018-10-05 ENCOUNTER — Telehealth: Payer: Self-pay

## 2018-10-05 NOTE — Telephone Encounter (Signed)
Faxed Form to [employer] @ [phone], confirmation went through. Notified the patient the FMLA is completed and faxed. Patient will pick the original copy 10/20/2018,form is left inside the cabin at the front.

## 2018-10-06 ENCOUNTER — Other Ambulatory Visit: Payer: Self-pay | Admitting: Internal Medicine

## 2018-10-06 DIAGNOSIS — K219 Gastro-esophageal reflux disease without esophagitis: Secondary | ICD-10-CM

## 2018-10-08 ENCOUNTER — Other Ambulatory Visit: Payer: Self-pay

## 2018-10-08 ENCOUNTER — Ambulatory Visit (INDEPENDENT_AMBULATORY_CARE_PROVIDER_SITE_OTHER): Payer: BC Managed Care – PPO | Admitting: Licensed Clinical Social Worker

## 2018-10-08 ENCOUNTER — Encounter: Payer: Self-pay | Admitting: Licensed Clinical Social Worker

## 2018-10-08 DIAGNOSIS — F321 Major depressive disorder, single episode, moderate: Secondary | ICD-10-CM
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
## 2018-10-09 ENCOUNTER — Encounter: Payer: Self-pay | Admitting: Gastroenterology

## 2018-10-13 ENCOUNTER — Telehealth: Payer: Self-pay | Admitting: *Deleted

## 2018-10-13 NOTE — Telephone Encounter (Signed)
Telephone encounter opened in error - no call made or received at this time. Yvonna Alanis, RN, 10/13/18- 6:13 A

## 2018-10-15 ENCOUNTER — Other Ambulatory Visit: Payer: Self-pay

## 2018-10-15 ENCOUNTER — Ambulatory Visit (INDEPENDENT_AMBULATORY_CARE_PROVIDER_SITE_OTHER): Payer: BC Managed Care – PPO | Admitting: Licensed Clinical Social Worker

## 2018-10-15 ENCOUNTER — Encounter: Payer: Self-pay | Admitting: Licensed Clinical Social Worker

## 2018-10-15 DIAGNOSIS — F321 Major depressive disorder, single episode, moderate: Secondary | ICD-10-CM
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
## 2018-10-16 NOTE — Progress Notes (Deleted)
   CC: Migraine headaches   HPI:  [name] is a 38 y.o. with essential htn, migrain, ibs, pcos who presents for follow up regarding migraine headaches. Please see problem based charting for evaluation, assessment, and plan.  Migraine headache   The patient is currently taking sumatriptan 50mg  prn when she has headache and propranolol 40mg  qd for prophylaxis.   -  HTN The patient's blood pressure during this visit was ***. The patient is currently taking spironolactone 25mg  qd and amlodipine 10mg  qd. His last blood pressure visits are  BP Readings from Last 3 Encounters:  09/22/18 (!) 141/82  08/26/18 (!) 147/80  08/04/18 138/81   The patient does/does not *** report palpitations, dizziness, chest pain, sob ***.  Assessment and Plan ***   MDD Patient is currently taking sertraline 50mg  qd    Past Medical History:  Diagnosis Date  . Anemia   . Asthma   . Hypertension   . LUQ abdominal pain 10/16/2017  . Microcytic anemia 12/01/2014  . Obesity    Review of Systems:  ***  Physical Exam:  There were no vitals filed for this visit. ***  Assessment & Plan:   See Encounters Tab for problem based charting.  Patient {GC/GE:[id number]::"discussed with","seen with"} Victoria. {NAMES:[id number]::"Booth","Granfortuna","E. Hoffman","Mullen","Narendra","Raines","Vincent"}

## 2018-10-17 ENCOUNTER — Other Ambulatory Visit: Payer: Self-pay | Admitting: Internal Medicine

## 2018-10-20 ENCOUNTER — Other Ambulatory Visit: Payer: Self-pay | Admitting: Internal Medicine

## 2018-10-20 ENCOUNTER — Encounter: Payer: BC Managed Care – PPO | Admitting: Internal Medicine

## 2018-10-22 ENCOUNTER — Ambulatory Visit (INDEPENDENT_AMBULATORY_CARE_PROVIDER_SITE_OTHER): Payer: BC Managed Care – PPO | Admitting: Licensed Clinical Social Worker

## 2018-10-22 ENCOUNTER — Encounter: Payer: Self-pay | Admitting: Licensed Clinical Social Worker

## 2018-10-22 DIAGNOSIS — F321 Major depressive disorder, single episode, moderate: Secondary | ICD-10-CM
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
## 2018-10-28 ENCOUNTER — Telehealth: Payer: Self-pay | Admitting: *Deleted

## 2018-10-28 ENCOUNTER — Other Ambulatory Visit: Payer: Self-pay | Admitting: Internal Medicine

## 2018-10-28 NOTE — Telephone Encounter (Signed)
Thank you, agree. 

## 2018-10-28 NOTE — Telephone Encounter (Signed)
Call from pt - c/o chest pain which started last night. Stated it feels like palpitations, in the middle to left side of her chest. Non radiating; denies arm,back pain. She stated it does not feet ike acid reflux.  Advised pt to go to nearest ER; she lives in [place]. Stated she will go to [facility].

## 2018-11-05 ENCOUNTER — Other Ambulatory Visit: Payer: Self-pay

## 2018-11-05 ENCOUNTER — Ambulatory Visit (INDEPENDENT_AMBULATORY_CARE_PROVIDER_SITE_OTHER): Payer: BC Managed Care – PPO | Admitting: Licensed Clinical Social Worker

## 2018-11-05 ENCOUNTER — Encounter: Payer: Self-pay | Admitting: Licensed Clinical Social Worker

## 2018-11-05 DIAGNOSIS — F411 Generalized anxiety disorder: Secondary | ICD-10-CM

## 2018-11-05 DIAGNOSIS — F321 Major depressive disorder, single episode, moderate: Secondary | ICD-10-CM

## 2018-11-05 NOTE — BH Specialist Note (Signed)
Integrated Behavioral Health Visit via Telemedicine (Telephone)  11/05/2018 DEANETTE TULLIUS [id number]   Session Start time: 12:03  Session End time: 12:30 Total time: 27 minutes  Referring Provider: Dr. Maricela Bo Type of Visit: Telephonic Patient location: Work [facility] Provider location: Office All persons participating in visit: patient and [facility]   Confirmed patient's address: Yes  Confirmed patient's phone number: Yes  Any changes to demographics: No   Discussed confidentiality: Yes    The following statements were read to the patient and/or legal guardian that are established with the [facility] Provider.  "The purpose of this phone visit is to provide behavioral health care while limiting exposure to the coronavirus (COVID19).  There is a possibility of technology failure and discussed alternative modes of communication if that failure occurs."  "By engaging in this telephone visit, you consent to the provision of healthcare.  Additionally, you authorize for your insurance to be billed for the services provided during this telephone visit."   Patient and/or legal guardian consented to telephone visit: Yes   PRESENTING CONCERNS: Patient and/or family reports the following symptoms/concerns: depression, overwhelmed, anxiety, and stress.  Duration of problem: increased over the past three months; Severity of problem: moderate  GOALS ADDRESSED: Patient will: 1.  Reduce symptoms of: anxiety, depression, insomnia, mood instability and stress  2.  Increase knowledge and/or ability of: coping skills, healthy habits, self-management skills and stress reduction  3.  Demonstrate ability to: Increase healthy adjustment to current life circumstances and Increase adequate support systems for patient/family  INTERVENTIONS: Interventions utilized:  Mindfulness or Psychologist, educational, Behavioral Activation, Brief CBT, Supportive Counseling and Sleep Hygiene Standardized Assessments  completed: assessed for SI, HI, and self-harm.  ASSESSMENT: Patient currently experiencing anxiety, depression, and grief. Patient recently lost a family member, and is grieving the loss of her. Patient reported recently having chest pains, but she was unable to go to the hospital due to being unable to leave her place of employment. Patient thinks it was anxiety. Patient reported still consistent heaviness in her chest and reported she does not feel like herself. Patient was advised to speak to her doctor about these issues. Patient is trying to read and watch television at night to calm her nerves. Patient is continuing to have issus with sleep. Patient reported she is extremely stressed at work because she is fearful of losing her job. Patient is having challenges remaining focused at work, and will make careless mistakes. Patient feels very unsupported in her work environment. Patient was encouraged to implement relaxing coping skills while at work to decrease stress. Patient is complementing taking a leave of absence from work due to her mental health.   Patient may benefit from counseling.  PLAN: 1. Follow up with behavioral health clinician on : one week via phone.  Dessie Coma, [facility], [place]

## 2018-11-09 DIAGNOSIS — Z1151 Encounter for screening for human papillomavirus (HPV): Secondary | ICD-10-CM | POA: Diagnosis not present

## 2018-11-09 DIAGNOSIS — Z3200 Encounter for pregnancy test, result unknown: Secondary | ICD-10-CM | POA: Diagnosis not present

## 2018-11-09 DIAGNOSIS — R350 Frequency of micturition: Secondary | ICD-10-CM | POA: Diagnosis not present

## 2018-11-09 DIAGNOSIS — Z6841 Body Mass Index (BMI) 40.0 and over, adult: Secondary | ICD-10-CM | POA: Diagnosis not present

## 2018-11-09 DIAGNOSIS — Z01419 Encounter for gynecological examination (general) (routine) without abnormal findings: Secondary | ICD-10-CM | POA: Diagnosis not present

## 2018-11-10 ENCOUNTER — Ambulatory Visit: Payer: BC Managed Care – PPO | Admitting: Licensed Clinical Social Worker

## 2018-11-10 ENCOUNTER — Telehealth: Payer: Self-pay | Admitting: Licensed Clinical Social Worker

## 2018-11-10 NOTE — Telephone Encounter (Signed)
Patient was contacted for our scheduled appointment. Patient did not answer, and a vm was left for the patient to contact our office to reschedule.

## 2018-11-11 ENCOUNTER — Ambulatory Visit: Payer: BC Managed Care – PPO | Admitting: Licensed Clinical Social Worker

## 2018-11-11 ENCOUNTER — Telehealth: Payer: Self-pay | Admitting: Licensed Clinical Social Worker

## 2018-11-11 NOTE — Telephone Encounter (Signed)
Patient was called for her scheduled appointment. Patient did not answer, and a vm was left for the patient.  

## 2018-12-14 ENCOUNTER — Other Ambulatory Visit: Payer: Self-pay

## 2018-12-14 ENCOUNTER — Ambulatory Visit (INDEPENDENT_AMBULATORY_CARE_PROVIDER_SITE_OTHER): Payer: BC Managed Care – PPO | Admitting: Internal Medicine

## 2018-12-14 ENCOUNTER — Encounter: Payer: Self-pay | Admitting: Internal Medicine

## 2018-12-14 ENCOUNTER — Other Ambulatory Visit: Payer: Self-pay | Admitting: Internal Medicine

## 2018-12-14 ENCOUNTER — Observation Stay (HOSPITAL_COMMUNITY)
Admission: AD | Admit: 2018-12-14 | Discharge: 2018-12-15 | Disposition: A | Discharge status: Home / Self Care | Payer: BC Managed Care – PPO | Source: Ambulatory Visit | Attending: Student in an Organized Health Care Education/Training Program | Admitting: Student in an Organized Health Care Education/Training Program

## 2018-12-14 VITALS — BP 151/101 | HR 76 | Temp 97.7°F | Ht 66.0 in | Wt 297.3 lb

## 2018-12-14 DIAGNOSIS — Z20828 Contact with and (suspected) exposure to other viral communicable diseases: Secondary | ICD-10-CM | POA: Insufficient documentation

## 2018-12-14 DIAGNOSIS — E669 Obesity, unspecified: Secondary | ICD-10-CM

## 2018-12-14 DIAGNOSIS — Z87891 Personal history of nicotine dependence: Secondary | ICD-10-CM | POA: Insufficient documentation

## 2018-12-14 DIAGNOSIS — I160 Hypertensive urgency: Secondary | ICD-10-CM | POA: Diagnosis not present

## 2018-12-14 DIAGNOSIS — I10 Essential (primary) hypertension: Principal | ICD-10-CM | POA: Insufficient documentation

## 2018-12-14 DIAGNOSIS — Z823 Family history of stroke: Secondary | ICD-10-CM

## 2018-12-14 DIAGNOSIS — Z79899 Other long term (current) drug therapy: Secondary | ICD-10-CM

## 2018-12-14 DIAGNOSIS — R519 Headache, unspecified: Secondary | ICD-10-CM | POA: Diagnosis not present

## 2018-12-14 DIAGNOSIS — D509 Iron deficiency anemia, unspecified: Secondary | ICD-10-CM | POA: Diagnosis not present

## 2018-12-14 DIAGNOSIS — Z881 Allergy status to other antibiotic agents status: Secondary | ICD-10-CM

## 2018-12-14 DIAGNOSIS — G43909 Migraine, unspecified, not intractable, without status migrainosus: Secondary | ICD-10-CM | POA: Insufficient documentation

## 2018-12-14 DIAGNOSIS — J45909 Unspecified asthma, uncomplicated: Secondary | ICD-10-CM | POA: Diagnosis not present

## 2018-12-14 DIAGNOSIS — Z8249 Family history of ischemic heart disease and other diseases of the circulatory system: Secondary | ICD-10-CM

## 2018-12-14 DIAGNOSIS — Z888 Allergy status to other drugs, medicaments and biological substances status: Secondary | ICD-10-CM

## 2018-12-14 DIAGNOSIS — Z91048 Other nonmedicinal substance allergy status: Secondary | ICD-10-CM

## 2018-12-14 DIAGNOSIS — Z880 Allergy status to penicillin: Secondary | ICD-10-CM

## 2018-12-14 DIAGNOSIS — K219 Gastro-esophageal reflux disease without esophagitis: Secondary | ICD-10-CM

## 2018-12-14 DIAGNOSIS — Z91013 Allergy to seafood: Secondary | ICD-10-CM

## 2018-12-14 LAB — URINALYSIS, ROUTINE W REFLEX MICROSCOPIC
Bilirubin Urine: NEGATIVE
Glucose, UA: NEGATIVE mg/dL
Hgb urine dipstick: NEGATIVE
Ketones, ur: NEGATIVE mg/dL
Leukocytes,Ua: NEGATIVE
Nitrite: NEGATIVE
Protein, ur: NEGATIVE mg/dL
Specific Gravity, Urine: 1.019 (ref 1.005–1.030)
pH: 6 (ref 5.0–8.0)

## 2018-12-14 LAB — BASIC METABOLIC PANEL
Anion gap: 9 (ref 5–15)
BUN: 9 mg/dL (ref 6–20)
CO2: 22 mmol/L (ref 22–32)
Calcium: 8.9 mg/dL (ref 8.9–10.3)
Chloride: 104 mmol/L (ref 98–111)
Creatinine, Ser: 0.69 mg/dL (ref 0.44–1.00)
GFR calc Af Amer: >60 mL/min (ref >60)
GFR calc non Af Amer: >60 mL/min (ref >60)
Glucose, Bld: 92 mg/dL (ref 70–99)
Potassium: 3.9 mmol/L (ref 3.5–5.1)
Sodium: 135 mmol/L (ref 135–145)

## 2018-12-14 LAB — CBC
HCT: 39.2 % (ref 36.0–46.0)
Hemoglobin: 11.5 g/dL — ABNORMAL LOW (ref 12.0–15.0)
MCH: 22.7 pg — ABNORMAL LOW (ref 26.0–34.0)
MCHC: 29.3 g/dL — ABNORMAL LOW (ref 30.0–36.0)
MCV: 77.3 fL — ABNORMAL LOW (ref 80.0–100.0)
Platelets: 337 10*3/uL (ref 150–400)
RBC: 5.07 MIL/uL (ref 3.87–5.11)
RDW: 16 % — ABNORMAL HIGH (ref 11.5–15.5)
WBC: 7 10*3/uL (ref 4.0–10.5)
nRBC: 0 % (ref 0.0–0.2)

## 2018-12-14 MED ORDER — SPIRONOLACTONE 25 MG PO TABS: 25.0000 mg | ORAL_TABLET | Freq: Every day | ORAL | Status: DC

## 2018-12-14 MED ORDER — SPIRONOLACTONE 25 MG PO TABS
25.0000 mg | ORAL_TABLET | Freq: Every day | ORAL | Status: DC
  Administered 2018-12-15: 25 mg via ORAL
  Filled 2018-12-14: qty 1

## 2018-12-14 MED ORDER — AMLODIPINE BESYLATE 10 MG PO TABS
10.0000 mg | ORAL_TABLET | Freq: Every day | ORAL | Status: DC
  Administered 2018-12-14: 10 mg via ORAL
  Filled 2018-12-14 (×2): qty 1

## 2018-12-14 MED ORDER — PROPRANOLOL HCL 40 MG PO TABS
40.0000 mg | ORAL_TABLET | Freq: Every day | ORAL | Status: DC
  Filled 2018-12-14: qty 1

## 2018-12-14 MED ORDER — HYDRALAZINE HCL 20 MG/ML IJ SOLN
5.0000 mg | Freq: Four times a day (QID) | INTRAMUSCULAR | Status: DC | PRN
  Administered 2018-12-14: 5 mg via INTRAVENOUS
  Filled 2018-12-14: qty 1

## 2018-12-14 MED ORDER — PROPRANOLOL HCL 40 MG PO TABS
40.0000 mg | ORAL_TABLET | Freq: Every day | ORAL | Status: DC
  Filled 2018-12-14 (×2): qty 1

## 2018-12-14 MED ORDER — PANTOPRAZOLE SODIUM 40 MG PO TBEC
40.0000 mg | DELAYED_RELEASE_TABLET | Freq: Two times a day (BID) | ORAL | Status: DC
  Administered 2018-12-14 – 2018-12-15 (×2): 40 mg via ORAL
  Filled 2018-12-14 (×2): qty 1

## 2018-12-14 MED ORDER — FERROUS SULFATE 325 (65 FE) MG PO TABS: 325.0000 mg | ORAL_TABLET | ORAL | Status: DC

## 2018-12-14 MED ORDER — ENOXAPARIN SODIUM 80 MG/0.8ML ~~LOC~~ SOLN: 70.0000 mg | SUBCUTANEOUS | Status: DC

## 2018-12-14 MED ORDER — SERTRALINE HCL 50 MG PO TABS: 50.0000 mg | ORAL_TABLET | Freq: Every day | ORAL | Status: DC

## 2018-12-14 MED ORDER — PROMETHAZINE HCL 25 MG PO TABS: 25.0000 mg | ORAL_TABLET | Freq: Four times a day (QID) | ORAL | Status: DC | PRN

## 2018-12-14 MED ORDER — SUMATRIPTAN SUCCINATE 50 MG PO TABS: 50.0000 mg | ORAL_TABLET | Freq: Once | ORAL | Status: DC | PRN

## 2018-12-14 MED ORDER — ACETAMINOPHEN 325 MG PO TABS
650.0000 mg | ORAL_TABLET | Freq: Four times a day (QID) | ORAL | Status: DC | PRN
  Administered 2018-12-14 – 2018-12-15 (×2): 650 mg via ORAL
  Filled 2018-12-14: qty 2

## 2018-12-14 MED ORDER — SUMATRIPTAN SUCCINATE 50 MG PO TABS
50.0000 mg | ORAL_TABLET | Freq: Once | ORAL | Status: DC | PRN
  Filled 2018-12-14: qty 1

## 2018-12-14 MED ORDER — ENOXAPARIN SODIUM 150 MG/ML ~~LOC~~ SOLN: 40.0000 mg | SUBCUTANEOUS | Status: DC

## 2018-12-14 NOTE — Assessment & Plan Note (Addendum)
HPI:  Victoria Booth is a 38 y/o female that presents to the acute care clinic with a history of unresolved headaches. Patient states that she has had headaches since last Thursday. She states that these headaches are different from her migraines, and scheduled herself for a clinic visit because her headaches feel like they are related to her hypertension, and she has had elevated systolic pressures in the 546T and diastolic pressures in the 110s. She states that she is adherent to her blood pressure medications. She has stopped her propranolol due to having too many medications. She endorses new onset nausea and vomiting, and visual changes. She typically has nausea with her migraines, but not vomiting. Her blurry vision lasts for several minutes and she has had multiple episodes over the past 4 days. Her normal headache abortive measures, sleeping, have not relieved her headaches. Nothing worsens them. She denies fevers, myalgias, chest pains, palpitations, dysuria, polyuria, and acute back pain.   Additionally, she does have a family history for hypertension and a mother who had three stroke events under the age a 30.   Assessment:  [name] is a 38 y/o female, with a PMH of HTN, asthma, and anemia, who presents to the clinic with hypertensive urgency. Patient endorses new onset visual changes, nausea, and vomiting in the presence of increased home blood pressures in the  160s/110s range, with clinic pressures of 151/101 and 164/96 on repeat. While her pressures are not grossly elevated, her symptoms are concerning for hypertensive urgency. While the she does have a family history of stroke and hypertension, her physical and neurological exam do not indicate symptoms of stroke at this time. Plan:  Hypertensive Urgency  - Admit to inpatient. Will reach out to on call IMTS service.  - Ordered EKG - Ordered Urinalysis - CBC/BMP ordered - Continue to monitor vitals - Lower blood pressure to  25-30% of her admitting BP over 24 hour period.  - Restart propranolol

## 2018-12-14 NOTE — Progress Notes (Signed)
Report called to [facility].  Pt has IV in right hand 22 Gauge.  N S locked.  Remains alert and oriented.  Sander Nephew, RN 3:00 PM

## 2018-12-14 NOTE — H&P (Addendum)
Date: 12/14/2018               Patient Name:  Victoria Booth MRN: [id number]  DOB: [birth year] Age / Sex: 38 y.o., female   PCP: Lorenso Courier, MD              Medical Service: Internal Medicine Teaching Service              Attending Physician: Dr. Oswaldo Done, Marquita Palms, *    First Contact: Wyman Songster, MS3 Pager: [phone]  Second Contact: Dr. Yetta Barre Pager: [phone]  Third Contact Dr. Dortha Schwalbe Pager: [phone]       After Hours (After 5p/  First Contact Pager: [phone]  weekends / holidays): Second Contact Pager: [phone]   Chief Complaint: Headache and hypertension   History of Present Illness: Victoria Booth is a 38 yo. F with a history significant for HTN, migraines, asthma and GERD who presents to the acute care clinic for complaints of headache that has lasted 4 days. She states her headache started on Thursday and never went away. It has gotten progressively worse and has been associated with nausea, vomiting, blurry vision and diplopia. She describes the headache as a throbbing sensation in the bilateral temples.  She has a history of migraines that typically resolve after two days and are located more in the back of her head/neck. Her typical migraine abortive measures such as rest and sumatriptan have not relieved her symptoms. Patient states she took two BC powders this morning which seemed to improve the headache slightly.   Patient is concerned that this headache is related to her blood pressure. She has a history of HTN that is refractory to treatment. Patient is currently taking amlodipine and spironolactone and states her home BP measurements typically run 150-160 systolic and 90-100 diastolic. She became concerned when her readings increased to over 160/110 around the time her symptoms started. She has been taking her BP meds as prescribed, except occasionally taking double the dose over these last 2 days. This has not lowered her pressure readings at home. She does endorse  chest pressure, palpitations and R lower extremity swelling. She denies any recent fever, chills, dyspnea, abdominal pain, changes in urine, or changes in mental status.   Meds:  No current facility-administered medications on file prior to encounter.    Current Outpatient Medications on File Prior to Encounter  Medication Sig Dispense Refill   amLODipine (NORVASC) 10 MG tablet TAKE 1 TABLET BY MOUTH EVERY DAY 90 tablet 0   Cetirizine HCl 10 MG CAPS Take 1 capsule (10 mg total) by mouth daily for 10 days. 10 capsule 0   DM-GG & DM-APAP-CPM (CORICIDIN HBP DAY/NIGHT COLD) 10-20 &15-200-2 MG MISC Take 1-2 capsules by mouth 2 (two) times daily as needed. 24 each 0   ferrous sulfate 325 (65 FE) MG tablet TAKE 1 TABLET BY MOUTH EVERY OTHER DAY 45 tablet 0   HYDROcodone-acetaminophen (NORCO/VICODIN) 5-325 MG tablet Take 1-2 tablets by mouth every 6 (six) hours as needed for moderate pain. 10 tablet 0   ibuprofen (ADVIL) 600 MG tablet Take 1 tablet (600 mg total) by mouth every 6 (six) hours as needed. 30 tablet 0   ondansetron (ZOFRAN ODT) 4 MG disintegrating tablet Take 1 tablet (4 mg total) by mouth every 8 (eight) hours as needed for nausea or vomiting. (Patient not taking: Reported on 08/04/2018) 20 tablet 0   pantoprazole (PROTONIX) 40 MG tablet TAKE 1 TABLET BY MOUTH TWICE A DAY 180 tablet  0   promethazine (PHENERGAN) 25 MG tablet Take 1 tablet (25 mg total) by mouth every 6 (six) hours as needed for nausea or vomiting. 30 tablet 0   propranolol (INDERAL) 20 MG tablet TAKE 2 TABLETS BY MOUTH EVERY DAY 180 tablet 0   sertraline (ZOLOFT) 50 MG tablet TAKE 1 TABLET BY MOUTH EVERY DAY 90 tablet 1   spironolactone (ALDACTONE) 25 MG tablet TAKE 1 TABLET (25 MG TOTAL) BY MOUTH DAILY FOR 90 DOSES. 90 tablet 0   SUMAtriptan (IMITREX) 50 MG tablet Take 1 tablet (50 mg total) by mouth once as needed for migraine. May repeat in 2 hours if headache persists or recurs. 30 tablet 0   tamsulosin  (FLOMAX) 0.4 MG CAPS capsule Take 1 capsule (0.4 mg total) by mouth daily. (Patient not taking: Reported on 08/04/2018) 30 capsule 0    Allergies: Allergies as of 12/14/2018 - Review Complete 12/14/2018  Allergen Reaction Noted   Penicillins Shortness Of Breath, Rash, and Other (See Comments) 08/25/2014   Shellfish allergy Swelling 12/29/2010   Sulfa antibiotics Hives 12/29/2010   Keflex [cephalexin] Itching and Other (See Comments) 10/03/2017   Lisinopril Cough 06/05/2017   Iodine Rash 08/25/2014   Past Medical History:  Diagnosis Date   Anemia    Asthma    Hypertension    LUQ abdominal pain 10/16/2017   Microcytic anemia 12/01/2014   Obesity     Family History:  Family History  Problem Relation Age of Onset   Diabetes Mother    Hypertension Mother    Heart attack Mother        2016- 50 yrs   Breast cancer Maternal Grandmother    Hypertension Father    Colon cancer Neg Hx    Stomach cancer Neg Hx    Pancreatic cancer Neg Hx    Social History:  Tobacco Use   Smoking status: Former Smoker    Packs/day: 0.10    Types: Cigarettes    Quit date: 04/06/2015    Years since quitting: 3.7   Smokeless tobacco: Never Used  Substance Use Topics   Alcohol use: No    Alcohol/week: 0.0 standard drinks   Drug use: No   Review of Systems: A complete ROS was negative except as per HPI.   Physical Exam: Blood pressure (!) 159/106, pulse 76, temperature 98 F (36.7 C), temperature source Oral, SpO2 99 %.  Physical Exam  Constitutional: She is oriented to person, place, and time. She appears well-developed and well-nourished. No distress.  HENT:  Head: Normocephalic and atraumatic.  Mouth/Throat: Oropharynx is clear and moist.  Eyes: Pupils are equal, round, and reactive to light. Conjunctivae and EOM are normal. No scleral icterus.  Neck: Normal range of motion. Neck supple. No JVD present. No thyromegaly present.  Cardiovascular: Normal rate,  regular rhythm, normal heart sounds and intact distal pulses. Exam reveals no gallop and no friction rub.  No murmur heard. Pulmonary/Chest: Effort normal. No respiratory distress. She has no wheezes. She has no rales. She exhibits no tenderness.  Abdominal: Soft. Bowel sounds are normal. She exhibits no distension and no mass. There is no abdominal tenderness. There is no rebound and no guarding.  Musculoskeletal:        General: No tenderness, deformity or edema.     Comments: No LE swelling noted  Lymphadenopathy:    She has no cervical adenopathy.  Neurological: She is alert and oriented to person, place, and time. No cranial nerve deficit.  Strength 5/5 throughout  Skin: Skin is warm and dry. She is not diaphoretic.  Psychiatric: She has a normal mood and affect. Her behavior is normal. Judgment and thought content normal.   Assessment & Plan by Problem: Melvinia A Mcculley is a 38 yo. F with a history of HTN, migraines, asthma and GERD who presents to the acute care clinic for 4 days of headache likely 2/2 hypertensive urgency.  Active Problems:   Hypertensive urgency Patient presents with a 4 day history of headache combined with nausea, vomiting and vision changes in the presence of elevated BP readings, 160-170/110 range. Her BP readings do not meet criteria for hypertensive urgency (>180/120) but constellation of sx and worsening of headache is concerning. No neurological deficits were appreciated on physical exam and patient does not seem to be in severe distress. She does have some complaints of chest discomfort with palpitations and we will evaluate with EKG. She does not require immediate intervention and we will admit her for overnight monitoring with BP control. Will do workup further workup to evaluate for signs of end organ damage. We will continue her on her home BP meds with the addition of propanalol with BP goals <160/100.  -admit to inpatient  -continue amlodipine 10mg   daily -continue spironolactone 25mg  daily  -start propanolol 40mg  daily  -Phernergan 25 mg prn   -CBC/BMP/urinalysis  -EKG  -continue to monitor vitals   Headache Her headache is concerning for signs of hypertensive urgency though it could also be an atypical presentation of migraine. Patient has a significant history of migraines and we will continue to offer abortive therapies for migraine while we treat HTN.  -tylenol 650mg  q6h prn -holding home sumatriptan in light of hypertension -continue sertraline 50mg  daily   Microcytic Anemia Patient has a h/o microcytic anemia. Hgb 11.5 on admission with MCV of 77.3. -continue ferrous sulfate tablets 325 mg daily     DVT Prophylaxis: Lovenox 40 mg, SCDs  Diet: low sodium Fluids: none CODE STATUS: FULL CODE   Dispo: Admit patient to Observation with expected length of stay less than 2 midnights.  Signed: Melina Modena, Medical Student 12/14/2018, 4:41 PM  Pager: [phone]   Attestation for Student Documentation:  I personally was present and performed or re-performed the history, physical exam and medical decision-making activities of this service and have verified that the service and findings are accurately documented in the students note.  Ladona Horns, MD 12/14/2018, 6:18 PM

## 2018-12-14 NOTE — Progress Notes (Signed)
   CC: Headaches and Fatigue  HPI:  Victoria Booth is a 38 y.o. , with a PMHx of microcytic anemia, HTN, and asthma, who presents to the acute care clinic with complaints of Headaches. To see patient's chronic and acute medical management please see encounter's tab for problem based charting.   Past Medical History:  Diagnosis Date  . Anemia   . Asthma   . Hypertension   . LUQ abdominal pain 10/16/2017  . Microcytic anemia 12/01/2014  . Obesity    Review of Systems:   Review of Systems  Constitutional: Negative for chills, fever and weight loss.  HENT: Negative for congestion, hearing loss and nosebleeds.   Eyes: Positive for blurred vision. Negative for double vision, pain and discharge.  Respiratory: Negative for cough, hemoptysis and shortness of breath.   Cardiovascular: Positive for leg swelling. Negative for chest pain and palpitations.       Endorses slight R sided LLE swelling.   Gastrointestinal: Positive for nausea and vomiting. Negative for abdominal pain, constipation, diarrhea and heartburn.  Skin: Negative for rash.  Neurological: Positive for headaches. Negative for dizziness, sensory change, speech change and focal weakness.    Physical Exam: Physical Exam Constitutional:      General: She is not in acute distress.    Appearance: She is obese. She is not ill-appearing or toxic-appearing.     Comments: Patient sitting comfortably in chair, no acute distress or slurred speech.   HENT:     Head: Normocephalic and atraumatic.  Eyes:     Extraocular Movements: Extraocular movements intact.     Right eye: Normal extraocular motion and no nystagmus.     Left eye: Normal extraocular motion and no nystagmus.     Pupils: Pupils are equal, round, and reactive to light.  Neck:     Musculoskeletal: Normal range of motion.  Cardiovascular:     Rate and Rhythm: Normal rate and regular rhythm.     Heart sounds: Normal heart sounds. No murmur. No friction rub. No  gallop.   Pulmonary:     Effort: Pulmonary effort is normal.     Breath sounds: Normal breath sounds. No wheezing, rhonchi or rales.  Abdominal:     General: Bowel sounds are normal.     Palpations: Abdomen is soft.     Tenderness: There is no abdominal tenderness. There is no guarding.  Musculoskeletal:        General: No swelling or tenderness.     Comments: Strength 5/5 in both upper and lower extremities bilaterally.   Skin:    General: Skin is warm.  Neurological:     Mental Status: She is alert and oriented to person, place, and time.     Cranial Nerves: No cranial nerve deficit or facial asymmetry.  Psychiatric:        Mood and Affect: Mood normal. Mood is not anxious or depressed.        Behavior: Behavior normal.    Vitals:   12/14/18 1343  BP: (!) 151/101  Pulse: 76  Temp: 97.7 F (36.5 C)  TempSrc: Oral  SpO2: 99%  Weight: 297 lb 4.8 oz (134.9 kg)  Height: 5\' 6"  (1.676 m)     Assessment & Plan:   See Encounters Tab for problem based charting.  Patient seen with Dr. Dareen Piano

## 2018-12-15 ENCOUNTER — Encounter (HOSPITAL_COMMUNITY): Payer: Self-pay | Admitting: General Practice

## 2018-12-15 ENCOUNTER — Observation Stay (HOSPITAL_COMMUNITY): Payer: BC Managed Care – PPO

## 2018-12-15 DIAGNOSIS — Z6841 Body Mass Index (BMI) 40.0 and over, adult: Secondary | ICD-10-CM | POA: Diagnosis not present

## 2018-12-15 DIAGNOSIS — E669 Obesity, unspecified: Secondary | ICD-10-CM | POA: Diagnosis not present

## 2018-12-15 DIAGNOSIS — Z20828 Contact with and (suspected) exposure to other viral communicable diseases: Secondary | ICD-10-CM | POA: Diagnosis not present

## 2018-12-15 DIAGNOSIS — J45909 Unspecified asthma, uncomplicated: Secondary | ICD-10-CM | POA: Diagnosis not present

## 2018-12-15 DIAGNOSIS — Z79899 Other long term (current) drug therapy: Secondary | ICD-10-CM | POA: Diagnosis not present

## 2018-12-15 DIAGNOSIS — Z87891 Personal history of nicotine dependence: Secondary | ICD-10-CM | POA: Diagnosis not present

## 2018-12-15 DIAGNOSIS — R519 Headache, unspecified: Secondary | ICD-10-CM | POA: Diagnosis not present

## 2018-12-15 DIAGNOSIS — I10 Essential (primary) hypertension: Secondary | ICD-10-CM | POA: Diagnosis not present

## 2018-12-15 DIAGNOSIS — G43909 Migraine, unspecified, not intractable, without status migrainosus: Secondary | ICD-10-CM | POA: Diagnosis not present

## 2018-12-15 LAB — CBC
HCT: 37 % (ref 36.0–46.0)
Hemoglobin: 11 g/dL — ABNORMAL LOW (ref 12.0–15.0)
MCH: 22.7 pg — ABNORMAL LOW (ref 26.0–34.0)
MCHC: 29.7 g/dL — ABNORMAL LOW (ref 30.0–36.0)
MCV: 76.3 fL — ABNORMAL LOW (ref 80.0–100.0)
Platelets: 302 10*3/uL (ref 150–400)
RBC: 4.85 MIL/uL (ref 3.87–5.11)
RDW: 16.1 % — ABNORMAL HIGH (ref 11.5–15.5)
WBC: 7.3 10*3/uL (ref 4.0–10.5)
nRBC: 0 % (ref 0.0–0.2)

## 2018-12-15 LAB — BASIC METABOLIC PANEL
Anion gap: 8 (ref 5–15)
BUN: 6 mg/dL (ref 6–20)
CO2: 23 mmol/L (ref 22–32)
Calcium: 8.6 mg/dL — ABNORMAL LOW (ref 8.9–10.3)
Chloride: 107 mmol/L (ref 98–111)
Creatinine, Ser: 0.69 mg/dL (ref 0.44–1.00)
GFR calc Af Amer: >60 mL/min (ref >60)
GFR calc non Af Amer: >60 mL/min (ref >60)
Glucose, Bld: 98 mg/dL (ref 70–99)
Potassium: 3.7 mmol/L (ref 3.5–5.1)
Sodium: 138 mmol/L (ref 135–145)

## 2018-12-15 LAB — SARS CORONAVIRUS 2 (TAT 6-24 HRS): SARS Coronavirus 2: NEGATIVE

## 2018-12-15 MED ORDER — PROCHLORPERAZINE EDISYLATE 10 MG/2ML IJ SOLN
5.0000 mg | Freq: Once | INTRAMUSCULAR | Status: DC
  Filled 2018-12-15: qty 2

## 2018-12-15 MED ORDER — DIPHENHYDRAMINE HCL 50 MG/ML IJ SOLN
25.0000 mg | Freq: Once | INTRAMUSCULAR | Status: AC
  Administered 2018-12-15: 25 mg via INTRAVENOUS
  Filled 2018-12-15: qty 1

## 2018-12-15 MED ORDER — HYDRALAZINE HCL 25 MG PO TABS: 25.0000 mg | ORAL_TABLET | Freq: Four times a day (QID) | ORAL | Status: DC | PRN

## 2018-12-15 MED ORDER — SODIUM CHLORIDE 0.9 % IV BOLUS
250.0000 mL | Freq: Once | INTRAVENOUS | Status: AC
  Administered 2018-12-15: 250 mL via INTRAVENOUS

## 2018-12-15 NOTE — Progress Notes (Signed)
Pt's propranolol arrived on unit at 2345- pt BP at that time was 131/77 after giving Norvasc at 2104- propranolol held due to BP decrease after Norvasc.

## 2018-12-15 NOTE — Plan of Care (Signed)
  Problem: Education: Goal: Knowledge of General Education information will improve Description: Including pain rating scale, medication(s)/side effects and non-pharmacologic comfort measures Outcome: Progressing   Problem: Health Behavior/Discharge Planning: Goal: Ability to manage health-related needs will improve Outcome: Progressing   Problem: Clinical Measurements: Goal: Ability to maintain clinical measurements within normal limits will improve Outcome: Progressing Goal: Will remain free from infection Outcome: Progressing Goal: Diagnostic test results will improve Outcome: Progressing Goal: Respiratory complications will improve Outcome: Progressing Goal: Cardiovascular complication will be avoided Outcome: Progressing   Problem: Activity: Goal: Risk for activity intolerance will decrease Outcome: Progressing   Problem: Nutrition: Goal: Adequate nutrition will be maintained Outcome: Progressing   Problem: Coping: Goal: Level of anxiety will decrease Outcome: Progressing   Problem: Safety: Goal: Ability to remain free from injury will improve Outcome: Progressing   

## 2018-12-15 NOTE — Progress Notes (Signed)
Internal Medicine Clinic Attending  I saw and evaluated the patient.  I personally confirmed the key portions of the history and exam documented by Dr. Winters and I reviewed pertinent patient test results.  The assessment, diagnosis, and plan were formulated together and I agree with the documentation in the resident's note.  

## 2018-12-15 NOTE — Progress Notes (Signed)
   Subjective:  Patient was seen at bedside this morning and continues to have headache along with nausea and visual disburtances. She did not appear to be in acute distress but describes this as worst headache of her life. Her symptoms have remained consistent since yesterday. Her sumitriptan was held due to concerns about blood pressure. Headache has been managed with Tylenol 650mg  prn. There were no acute overnight events. Plan for imaging was discussed with patient and she had no other questions. She was agreeable to d/c today.   Objective:  Vital signs in last 24 hours: Vitals:   12/14/18 2345 12/15/18 0013 12/15/18 0502 12/15/18 0843  BP: 131/77  (!) 152/99 (!) 155/103  Pulse:   77   Temp:  97.8 F (36.6 C) 97.8 F (36.6 C)   TempSrc:  Oral Oral   SpO2:   100%   Weight:   132.8 kg    BMP Latest Ref Rng & Units 12/15/2018 12/14/2018 07/12/2018  Glucose 70 - 99 mg/dL 98 92 103(H)  BUN 6 - 20 mg/dL 6 9 9   Creatinine 0.44 - 1.00 mg/dL 0.69 0.69 0.72  BUN/Creat Ratio 9 - 23 - - -  Sodium 135 - 145 mmol/L 138 135 138  Potassium 3.5 - 5.1 mmol/L 3.7 3.9 3.9  Chloride 98 - 111 mmol/L 107 104 107  CO2 22 - 32 mmol/L 23 22 21(L)  Calcium 8.9 - 10.3 mg/dL 8.6(L) 8.9 8.9   Physical Exam:  General: no acute distress, no photophobia Neurological: A&Ox3, no focal neurological deficits.   Weight change:   Intake/Output Summary (Last 24 hours) at 12/15/2018 1226 Last data filed at 12/15/2018 0900 Gross per 24 hour  Intake 822 ml  Output 200 ml  Net 622 ml   CT head FINDINGS: Brain: Ventricles are normal in size and configuration. There is no intracranial mass, hemorrhage, extra-axial fluid collection, or midline shift. The brain parenchyma appears unremarkable. No evident acute infarct.  Assessment/Plan:  Patient is a 38 year old woman living with obesity, recurrent migraines, and essential hypertension admitted with moderate chronic hypertension symptomatic for uncontrolled  headache.    Principal Problem:   Hypertensive urgency Active Problems:   Essential hypertension, benign   Migraine  Headache  Patient described headache as worst of her life and different from previous migraines. Though suspicion was low, imaging was obtained to definitively rule out acute process. CT showed no mass, ischemia or bleed consistent with her intact neuro exam. She was admitted due to elevated BP with neurological manifestation but labs show no evidence of end organ damage and pressures never rose above 180/120. Headache is most likely due to an atypical severe migraine. We will give her migraine cocktail before she leaves today. Otherwise she is medically stable for d/c following treatment and resume home meds when she leaves.  -benadryl 25mg  IV one time -compazine 5mg  IV one time -250 mL bolus NS  -continue BP meds (spironolactone 25mg , amlodipine 10mg , hydralazine prn)  -continue Tylenol 650mg  prn for headache  -phenergan 25mg  prn for nausea  -d/c home today    LOS: 0 days   Melina Modena, Medical Student 12/15/2018, 12:26 PM

## 2018-12-16 NOTE — Discharge Summary (Signed)
Name: Victoria Booth MRN: [id number] DOB: 1980-02-16 38 y.o. PCP: Lorenso Courier, MD  Date of Admission: 12/14/2018  3:36 PM Date of Discharge: 12/15/2018 Attending Physician: Dr. Erlinda Hong MD   Discharge Diagnosis: 1. Moderate chronic hypertension with symptoms 2. Migraine headache  Discharge Medications:      Allergies as of 12/15/2018      Reactions   Penicillins Shortness Of Breath, Rash, Other (See Comments)   Has patient had a PCN reaction causing immediate rash, facial/tongue/throat swelling, SOB or lightheadedness with hypotension:yes Has patient had a PCN reaction causing severe rash involving mucus membranes or skin necrosis: no Has patient had a PCN reaction that required hospitalization : no Has patient had a PCN reaction occurring within the last 10 years:no  If all of the above answers are "NO", then may proceed with Cephalosporin use.   Lisinopril Cough   Shellfish Allergy Swelling   Sulfa Antibiotics Hives   Iodine Rash   Keflex [cephalexin] Itching, Other (See Comments)   Itching hands and legs         Medication List    STOP taking these medications   ibuprofen 600 MG tablet Commonly known as: ADVIL     TAKE these medications   acetaminophen 500 MG tablet Commonly known as: TYLENOL Take 1,000 mg by mouth every 6 (six) hours as needed for headache.   albuterol 108 (90 Base) MCG/ACT inhaler Commonly known as: VENTOLIN HFA Inhale 2 puffs into the lungs every 6 (six) hours as needed for wheezing or shortness of breath.   amLODipine 10 MG tablet Commonly known as: NORVASC TAKE 1 TABLET BY MOUTH EVERY DAY What changed: when to take this   BC HEADACHE POWDER PO Take 2 packets by mouth daily as needed (severe headache).   Cetirizine HCl 10 MG Caps Take 1 capsule (10 mg total) by mouth daily for 10 days.   DM-GG & DM-APAP-CPM 10-20 &15-200-2 MG Misc Commonly known as: Coricidin HBP Day/Night Cold Take 1-2 capsules by  mouth 2 (two) times daily as needed.   ferrous sulfate 325 (65 FE) MG tablet TAKE 1 TABLET BY MOUTH EVERY OTHER DAY   HYDROcodone-acetaminophen 5-325 MG tablet Commonly known as: NORCO/VICODIN Take 1-2 tablets by mouth every 6 (six) hours as needed for moderate pain.   ondansetron 4 MG disintegrating tablet Commonly known as: Zofran ODT Take 1 tablet (4 mg total) by mouth every 8 (eight) hours as needed for nausea or vomiting.   pantoprazole 40 MG tablet Commonly known as: PROTONIX TAKE 1 TABLET BY MOUTH TWICE A DAY   promethazine 25 MG tablet Commonly known as: PHENERGAN Take 1 tablet (25 mg total) by mouth every 6 (six) hours as needed for nausea or vomiting.   propranolol 20 MG tablet Commonly known as: INDERAL TAKE 2 TABLETS BY MOUTH EVERY DAY   sertraline 50 MG tablet Commonly known as: ZOLOFT TAKE 1 TABLET BY MOUTH EVERY DAY   spironolactone 25 MG tablet Commonly known as: ALDACTONE TAKE 1 TABLET (25 MG TOTAL) BY MOUTH DAILY FOR 90 DOSES. What changed: when to take this   SUMAtriptan 50 MG tablet Commonly known as: Imitrex Take 1 tablet (50 mg total) by mouth once as needed for migraine. May repeat in 2 hours if headache persists or recurs. What changed:   when to take this  additional instructions   tamsulosin 0.4 MG Caps capsule Commonly known as: Flomax Take 1 capsule (0.4 mg total) by mouth daily.   VITAMIN B-12 PO  Take 1 tablet by mouth daily.       Disposition and follow-up:   Victoria Booth was discharged from [facility] in stable condition.  At the hospital follow up visit please address:  1.  Blood pressure and migraine prevention and treatment per your PCP. Pt may benefit from outpatient neuro referral for complex migraine and resuming propranolol for migraine prophylaxis and BP control.  2.  Labs / imaging needed at time of follow-up: none  3.  Pending labs/ test needing follow-up: none  Follow-up  Appointments:   Internal Medicine Clinic, Friday 12/18/2018 at 10:45 AM.   Hospital Course by problem list: 1. Patient was admitted to the hospital after she had complaints of elevated BP 160/110 with continuous headache and blurry vision. Patient had a history of hypertension and migraines but we admitted her for observation out of concern for the BP reaching hypertensive urgency/emegerncy range. Her BP was kept <160 with BP medication overnight without improvement in symptoms.  She received a CT scan to rule out acute brain pathology as she describe this headache as different in quality and more severe than her typical migraines. Scan did no show any abnormality. She was given some fluids and benadryl which seemed to resolve her headache completely. Patient was deemed stable for d/c home.   Discharge Vitals:   BP (!) 155/103   Pulse 77   Temp 97.8 F (36.6 C) (Oral)   Wt 132.8 kg   SpO2 100%   BMI 47.24 kg/m   Pertinent Labs, Studies, and Procedures:   CBC Latest Ref Rng & Units 12/15/2018 12/14/2018 07/12/2018  WBC 4.0 - 10.5 K/uL 7.3 7.0 8.2  Hemoglobin 12.0 - 15.0 g/dL 11.0(L) 11.5(L) 11.5(L)  Hematocrit 36.0 - 46.0 % 37.0 39.2 37.5  Platelets 150 - 400 K/uL 302 337 307   BMP Latest Ref Rng & Units 12/15/2018 12/14/2018 07/12/2018  Glucose 70 - 99 mg/dL 98 92 103(H)  BUN 6 - 20 mg/dL 6 9 9   Creatinine 0.44 - 1.00 mg/dL 0.69 0.69 0.72  BUN/Creat Ratio 9 - 23 - - -  Sodium 135 - 145 mmol/L 138 135 138  Potassium 3.5 - 5.1 mmol/L 3.7 3.9 3.9  Chloride 98 - 111 mmol/L 107 104 107  CO2 22 - 32 mmol/L 23 22 21(L)  Calcium 8.9 - 10.3 mg/dL 8.6(L) 8.9 8.9   CT:  FINDINGS: Brain: Ventricles are normal in size and configuration. There is no intracranial mass, hemorrhage, extra-axial fluid collection, or midline shift. The brain parenchyma appears unremarkable. No evident acute infarct.  Vascular: No hyperdense vessel. No evident vascular calcification.  Skull: Bony  calvarium appears intact.  Sinuses/Orbits: Visualized paranasal sinuses are clear. Orbits appear symmetric bilaterally.  Other: Mastoid air cells are clear. There is debris in the right external auditory canal.  IMPRESSION: Probable cerumen in the right external auditory canal. Study otherwise unremarkable.  Discharge Instructions:     Discharge Instructions    Diet - low sodium heart healthy   Complete by: As directed    Discharge instructions   Complete by: As directed    Victoria Booth,   It was a pleasure taking care of you at the hospital. You were admitted because of high blood pressure and headache. We did a CT of your brain and it was normal.  For your Blood pressure, please continue all your home medications and follow up with the Internal Medicine Clinic this Friday 12/18/2018 at 10:45 AM.   For your  headache, you will benefit from seeing a Neurologist for further evaluations.   Take Care!   Increase activity slowly   Complete by: As directed       Signed: Thom Chimes, MD 12/16/2018, 3:49 PM Pager: [phone]

## 2018-12-16 NOTE — Discharge Summary (Deleted)
Name: Victoria Booth MRN: [id number] DOB: 06-14-1980 38 y.o. PCP: Lorenso Courier, MD  Date of Admission: 12/14/2018  3:36 PM Date of Discharge: 12/15/2018 Attending Physician: Dr. Erlinda Hong MD   Discharge Diagnosis: 1. Moderate chronic hypertension with symptoms  Discharge Medications: Allergies as of 12/15/2018      Reactions   Penicillins Shortness Of Breath, Rash, Other (See Comments)   Has patient had a PCN reaction causing immediate rash, facial/tongue/throat swelling, SOB or lightheadedness with hypotension:yes Has patient had a PCN reaction causing severe rash involving mucus membranes or skin necrosis: no Has patient had a PCN reaction that required hospitalization : no Has patient had a PCN reaction occurring within the last 10 years:no  If all of the above answers are "NO", then may proceed with Cephalosporin use.   Lisinopril Cough   Shellfish Allergy Swelling   Sulfa Antibiotics Hives   Iodine Rash   Keflex [cephalexin] Itching, Other (See Comments)   Itching hands and legs      Medication List    STOP taking these medications   ibuprofen 600 MG tablet Commonly known as: ADVIL     TAKE these medications   acetaminophen 500 MG tablet Commonly known as: TYLENOL Take 1,000 mg by mouth every 6 (six) hours as needed for headache.   albuterol 108 (90 Base) MCG/ACT inhaler Commonly known as: VENTOLIN HFA Inhale 2 puffs into the lungs every 6 (six) hours as needed for wheezing or shortness of breath.   amLODipine 10 MG tablet Commonly known as: NORVASC TAKE 1 TABLET BY MOUTH EVERY DAY What changed: when to take this   BC HEADACHE POWDER PO Take 2 packets by mouth daily as needed (severe headache).   Cetirizine HCl 10 MG Caps Take 1 capsule (10 mg total) by mouth daily for 10 days.   DM-GG & DM-APAP-CPM 10-20 &15-200-2 MG Misc Commonly known as: Coricidin HBP Day/Night Cold Take 1-2 capsules by mouth 2 (two) times daily as needed.   ferrous  sulfate 325 (65 FE) MG tablet TAKE 1 TABLET BY MOUTH EVERY OTHER DAY   HYDROcodone-acetaminophen 5-325 MG tablet Commonly known as: NORCO/VICODIN Take 1-2 tablets by mouth every 6 (six) hours as needed for moderate pain.   ondansetron 4 MG disintegrating tablet Commonly known as: Zofran ODT Take 1 tablet (4 mg total) by mouth every 8 (eight) hours as needed for nausea or vomiting.   pantoprazole 40 MG tablet Commonly known as: PROTONIX TAKE 1 TABLET BY MOUTH TWICE A DAY   promethazine 25 MG tablet Commonly known as: PHENERGAN Take 1 tablet (25 mg total) by mouth every 6 (six) hours as needed for nausea or vomiting.   propranolol 20 MG tablet Commonly known as: INDERAL TAKE 2 TABLETS BY MOUTH EVERY DAY   sertraline 50 MG tablet Commonly known as: ZOLOFT TAKE 1 TABLET BY MOUTH EVERY DAY   spironolactone 25 MG tablet Commonly known as: ALDACTONE TAKE 1 TABLET (25 MG TOTAL) BY MOUTH DAILY FOR 90 DOSES. What changed: when to take this   SUMAtriptan 50 MG tablet Commonly known as: Imitrex Take 1 tablet (50 mg total) by mouth once as needed for migraine. May repeat in 2 hours if headache persists or recurs. What changed:   when to take this  additional instructions   tamsulosin 0.4 MG Caps capsule Commonly known as: Flomax Take 1 capsule (0.4 mg total) by mouth daily.   VITAMIN B-12 PO Take 1 tablet by mouth daily.  Disposition and follow-up:   Victoria Booth was discharged from [facility] in stable condition.  At the hospital follow up visit please address:  1.  Blood pressure and migraine prevention and treatment with your PCP.   2.  Labs / imaging needed at time of follow-up: none  3.  Pending labs/ test needing follow-up: none  Follow-up Appointments:   Internal Medicine Clinic, Friday 12/18/2018 at 10:45 AM.   Hospital Course by problem list: 1. Patient was admitted to the hospital after she had complaints of elevated BP  160/110 with continuous headache and blurry vision. Patient had a history of hypertension and migraines but we admitted her for observation out of concern for the BP reaching hypertensive urgency/emegerncy range. Her BP was kept <160 with BP medication overnight without improvement in symptoms.  She received a CT scan to rule out acute brain pathology as she describe this headache as different in quality and more severe than her typical migraines. Scan did no show any abnormality. She was given some fluids and benadryl which seemed to resolve her headache completely. Patient was deemed stable for d/c home.   Discharge Vitals:   BP (!) 155/103   Pulse 77   Temp 97.8 F (36.6 C) (Oral)   Wt 132.8 kg   SpO2 100%   BMI 47.24 kg/m   Pertinent Labs, Studies, and Procedures:   CBC Latest Ref Rng & Units 12/15/2018 12/14/2018 07/12/2018  WBC 4.0 - 10.5 K/uL 7.3 7.0 8.2  Hemoglobin 12.0 - 15.0 g/dL 11.0(L) 11.5(L) 11.5(L)  Hematocrit 36.0 - 46.0 % 37.0 39.2 37.5  Platelets 150 - 400 K/uL 302 337 307   BMP Latest Ref Rng & Units 12/15/2018 12/14/2018 07/12/2018  Glucose 70 - 99 mg/dL 98 92 103(H)  BUN 6 - 20 mg/dL 6 9 9   Creatinine 0.44 - 1.00 mg/dL 0.69 0.69 0.72  BUN/Creat Ratio 9 - 23 - - -  Sodium 135 - 145 mmol/L 138 135 138  Potassium 3.5 - 5.1 mmol/L 3.7 3.9 3.9  Chloride 98 - 111 mmol/L 107 104 107  CO2 22 - 32 mmol/L 23 22 21(L)  Calcium 8.9 - 10.3 mg/dL 8.6(L) 8.9 8.9   CT:  FINDINGS: Brain: Ventricles are normal in size and configuration. There is no intracranial mass, hemorrhage, extra-axial fluid collection, or midline shift. The brain parenchyma appears unremarkable. No evident acute infarct.  Vascular: No hyperdense vessel.  No evident vascular calcification.  Skull: Bony calvarium appears intact.  Sinuses/Orbits: Visualized paranasal sinuses are clear. Orbits appear symmetric bilaterally.  Other: Mastoid air cells are clear. There is debris in the right external  auditory canal.  IMPRESSION: Probable cerumen in the right external auditory canal. Study otherwise unremarkable.  Discharge Instructions: Discharge Instructions    Diet - low sodium heart healthy   Complete by: As directed    Discharge instructions   Complete by: As directed    Victoria Booth,   It was a pleasure taking care of you at the hospital. You were admitted because of high blood pressure and headache. We did a CT of your brain and it was normal.  For your Blood pressure, please continue all your home medications and follow up with the Internal Medicine Clinic this Friday 12/18/2018 at 10:45 AM.   For your headache, you will benefit from seeing a Neurologist for further evaluations.   Take Care!   Increase activity slowly   Complete by: As directed       Signed:  Albin Fischer, Medical Student 12/16/2018, 10:53 AM   Pager: [phone]

## 2018-12-18 ENCOUNTER — Encounter: Payer: Self-pay | Admitting: Internal Medicine

## 2018-12-18 ENCOUNTER — Other Ambulatory Visit: Payer: Self-pay

## 2018-12-18 ENCOUNTER — Ambulatory Visit (INDEPENDENT_AMBULATORY_CARE_PROVIDER_SITE_OTHER): Payer: BC Managed Care – PPO | Admitting: Internal Medicine

## 2018-12-18 VITALS — BP 149/100 | HR 85 | Temp 98.6°F | Ht 66.0 in | Wt 293.4 lb

## 2018-12-18 DIAGNOSIS — G43909 Migraine, unspecified, not intractable, without status migrainosus: Secondary | ICD-10-CM | POA: Diagnosis not present

## 2018-12-18 DIAGNOSIS — R42 Dizziness and giddiness: Secondary | ICD-10-CM | POA: Diagnosis not present

## 2018-12-18 DIAGNOSIS — I10 Essential (primary) hypertension: Secondary | ICD-10-CM | POA: Diagnosis not present

## 2018-12-18 DIAGNOSIS — Z79899 Other long term (current) drug therapy: Secondary | ICD-10-CM | POA: Diagnosis not present

## 2018-12-18 DIAGNOSIS — G43119 Migraine with aura, intractable, without status migrainosus: Secondary | ICD-10-CM

## 2018-12-18 MED ORDER — RIZATRIPTAN BENZOATE 5 MG PO TABS: 5.0000 mg | ORAL_TABLET | Freq: Once | ORAL | 2 refills | Status: DC | PRN

## 2018-12-18 MED ORDER — TOPIRAMATE 25 MG PO TABS: 25.0000 mg | ORAL_TABLET | Freq: Every day | ORAL | 2 refills | Status: DC

## 2018-12-18 NOTE — Progress Notes (Signed)
Internal Medicine Clinic Attending  Case discussed with Dr. Santos-Sanchez at the time of the visit.  We reviewed the resident's history and exam and pertinent patient test results.  I agree with the assessment, diagnosis, and plan of care documented in the resident's note.    

## 2018-12-18 NOTE — Assessment & Plan Note (Addendum)
Patient was recently admitted to the hospital 10/30-11/2 for HTN urgency after presenting to clinic with HA, vision changes, and BP 151/101.  During admission her BP was controlled but she continued to experience migraine headaches.  Head CT performed as she reported changed quality of HA and it did not show any acute intracranial abnormalities.  She received a migraine cocktail which resolved the HA but gave her GI upset and dizziness that lasted for 2 days.  She was discharged with recommendations to follow-up PCP for migraine and BP treatment.  She describes her migraine as bilaterally with throbbing and pulsating sensations associated with N/V, photophobia, and phonophobia.  They tend to present acutely and last 24 to 72 hours at times.  She has tried Tylenol and NSAIDs without improvement.  The only thing that seemed to work the past is Goody powder, which is not working very well at this time.  She also uses sumatriptan as abortive therapy, but experiences dizziness with this so is only able to take it at night.  She states she is headache free for a week at times and then has multiple migraine episodes the next.  She is not sure how many migraines she is having per month but states is definitely > 5.  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  -Start migraine prophylaxis with topiramate 25 mg daily. Goals of therapy discussed: reducing # HA and severity. Discussed this may be a trial and error process. She verbalized understanding.  -Stop sumatriptan due to side effects and start rizatriptan 5 mg as needed for migraines.  Patient educated on how to take this medication. -Follow-up in 6 weeks

## 2018-12-18 NOTE — Progress Notes (Signed)
   CC: BP and migraines follow up   HPI:  [name] is a 38 y.o. year-old female with PMH listed below who presents to clinic for BP and migraines follow-up. Please see problem based assessment and plan for further details.   Past Medical History:  Diagnosis Date  . Anemia   . Asthma   . Hypertension   . LUQ abdominal pain 10/16/2017  . Microcytic anemia 12/01/2014  . Obesity    Review of Systems:   Review of Systems  Constitutional: Negative for chills, fever, malaise/fatigue and weight loss.  Eyes: Positive for photophobia. Negative for blurred vision and double vision.  Respiratory: Negative for shortness of breath.   Cardiovascular: Negative for chest pain, palpitations and leg swelling.  Neurological: Positive for dizziness and headaches.    Physical Exam:  Vitals:   12/18/18 1104  BP: (!) 149/100  Pulse: 85  Temp: 98.6 F (37 C)  TempSrc: Oral  SpO2: 100%  Weight: 293 lb 6.4 oz (133.1 kg)  Height: 5\' 6"  (1.676 m)    General: Well-appearing young female in no acute distress Cardiac: regular rate and rhythm, nl S1/S2, no murmurs, rubs or gallops Pulm: CTAB, no wheezes or crackles, no increased work of breathing on room air    Assessment & Plan:   See Encounters Tab for problem based charting.  Patient discussed with Dr. Lynnae January

## 2018-12-18 NOTE — Patient Instructions (Addendum)
Ms. Deshong,   For your migraines, please start taking Topiramate 25 mg 1 tablet every day. This will take a few weeks to work.  Our hope is that we can decrease the number of headaches you have in a month and severity of them.   I also sent prescription for to your pharmacy for rizatriptan which is a different type of triptan and hopefully will not cause you dizziness when you take it.  Come back to see [place] in 6 weeks to see if these treatments are working.  They have any questions or concerns.  - Dr. Frederico Hamman

## 2018-12-21 ENCOUNTER — Telehealth: Payer: Self-pay | Admitting: *Deleted

## 2018-12-21 NOTE — Telephone Encounter (Signed)
Called patient today regarding her [place] paperwork.  Unable to leave a call back message as voice mail is full.  Will call patient back on 12/22/18.

## 2018-12-28 ENCOUNTER — Telehealth: Payer: Self-pay | Admitting: *Deleted

## 2018-12-28 NOTE — Telephone Encounter (Signed)
I spoke with patient this AM about FMLA paper work issues.  She tried to called the examiner but a 14 minute wait time.  She stated she will ask the examiner to call and speak with me directly so I can get an understanding of what the problem is with the answers Dr. Maricela Bo provided and what they are actually needing on the paper work.

## 2019-02-01 ENCOUNTER — Ambulatory Visit: Payer: BC Managed Care – PPO | Admitting: Urology

## 2019-02-01 ENCOUNTER — Encounter: Payer: Self-pay | Admitting: Urology

## 2019-02-02 ENCOUNTER — Ambulatory Visit: Payer: BC Managed Care – PPO

## 2019-02-02 ENCOUNTER — Ambulatory Visit: Payer: BC Managed Care – PPO | Admitting: Urology

## 2019-02-02 ENCOUNTER — Ambulatory Visit: Payer: BC Managed Care – PPO | Attending: Internal Medicine

## 2019-02-02 DIAGNOSIS — Z20828 Contact with and (suspected) exposure to other viral communicable diseases: Secondary | ICD-10-CM | POA: Diagnosis not present

## 2019-02-02 DIAGNOSIS — Z20822 Contact with and (suspected) exposure to covid-19: Secondary | ICD-10-CM

## 2019-02-03 ENCOUNTER — Ambulatory Visit: Payer: BC Managed Care – PPO | Admitting: Urology

## 2019-02-04 LAB — NOVEL CORONAVIRUS, NAA: SARS-CoV-2, NAA: DETECTED — AB

## 2019-02-12 ENCOUNTER — Other Ambulatory Visit: Payer: Self-pay

## 2019-02-12 ENCOUNTER — Encounter (HOSPITAL_COMMUNITY): Payer: Self-pay | Admitting: Emergency Medicine

## 2019-02-12 ENCOUNTER — Ambulatory Visit (HOSPITAL_COMMUNITY)
Admission: EM | Admit: 2019-02-12 | Discharge: 2019-02-12 | Disposition: A | Discharge status: Home / Self Care | Payer: BC Managed Care – PPO | Attending: Emergency Medicine | Admitting: Emergency Medicine

## 2019-02-12 ENCOUNTER — Ambulatory Visit (INDEPENDENT_AMBULATORY_CARE_PROVIDER_SITE_OTHER): Payer: BC Managed Care – PPO

## 2019-02-12 DIAGNOSIS — R109 Unspecified abdominal pain: Secondary | ICD-10-CM

## 2019-02-12 DIAGNOSIS — R631 Polydipsia: Secondary | ICD-10-CM

## 2019-02-12 DIAGNOSIS — K5900 Constipation, unspecified: Secondary | ICD-10-CM

## 2019-02-12 DIAGNOSIS — R682 Dry mouth, unspecified: Secondary | ICD-10-CM

## 2019-02-12 LAB — GLUCOSE, CAPILLARY: Glucose-Capillary: 91 mg/dL (ref 70–99)

## 2019-02-12 LAB — CBG MONITORING, ED: Glucose-Capillary: 91 mg/dL (ref 70–99)

## 2019-02-12 MED ORDER — FLEET ENEMA 7-19 GM/118ML RE ENEM: 1.0000 | ENEMA | Freq: Once | RECTAL | 0 refills | Status: AC

## 2019-02-12 NOTE — ED Provider Notes (Signed)
[facility]    CSN: [id number] Arrival date & time: 02/12/19  1107      History   Chief Complaint Chief Complaint  Patient presents with  . Constipation    HPI Victoria Booth is a 39 y.o. female.   Victoria Booth presents with complaints of constipation and dry mouth/ feeling dehydrated. Hasn't had a BM in 7 days. Feels full. Has felt like she needs to pass a BM and will sit on the toilet without any production. Three days ago started BID dulcolax and daily miralax packets, which have not helped. Pressure to abdomen but no other pain. Reflux symptoms are now present. No vomiting. Drank a bottle of magnesium citrate yesterday. Decreased appetite. Her mouth feels dry, has increased her water over the past two days, prior to this hadn't been drinking much water. Tested positive for covid-19 10 days ago. Has been thirsty. No increased urination. Has not been on any narcotics. Denies any  Known DM. History  Of reflux.     ROS per HPI, negative if not otherwise mentioned.      Past Medical History:  Diagnosis Date  . Anemia   . Asthma   . Hypertension   . LUQ abdominal pain 10/16/2017  . Microcytic anemia 12/01/2014  . Obesity     Patient Active Problem List   Diagnosis Date Noted  . Hypertensive urgency 12/14/2018  . Vitamin D deficiency 08/04/2018  . Nephrolithiasis 07/14/2018  . Functional dyspepsia 02/20/2018  . IBS (irritable colon syndrome) 02/20/2018  . Iron deficiency anemia 08/18/2017  . Acute pain of left shoulder 08/18/2017  . Insomnia 07/29/2017  . Left lower quadrant abdominal pain of unknown etiology 07/13/2017  . OSA (obstructive sleep apnea) 06/05/2017  . Major depressive disorder 03/21/2017  . Migraine 10/24/2016  . Hematuria 09/24/2016  . Prediabetes 08/21/2016  . Allergic rhinitis 06/18/2016  . Left flank pain 06/05/2016  . PCOS (polycystic ovarian syndrome) 04/05/2016  . Essential hypertension, benign 12/01/2014  . Asthma, chronic  12/01/2014  . Gastroesophageal reflux disease 12/01/2014  . Preventative health care 12/01/2014    Past Surgical History:  Procedure Laterality Date  . EXTRACORPOREAL SHOCK WAVE LITHOTRIPSY Left 07/16/2018   Procedure: EXTRACORPOREAL SHOCK WAVE LITHOTRIPSY (ESWL);  Surgeon: Hollice Espy, MD;  Location: [facility] ORS;  Service: Urology;  Laterality: Left;  . TOENAIL EXCISION      OB History   No obstetric history on file.      Home Medications    Prior to Admission medications   Medication Sig Start Date End Date Taking? Authorizing Provider  acetaminophen (TYLENOL) 500 MG tablet Take 1,000 mg by mouth every 6 (six) hours as needed for headache.    [provider]  albuterol (VENTOLIN HFA) 108 (90 Base) MCG/ACT inhaler Inhale 2 puffs into the lungs every 6 (six) hours as needed for wheezing or shortness of breath.  09/03/18   [provider]  amLODipine (NORVASC) 10 MG tablet TAKE 1 TABLET BY MOUTH EVERY DAY Patient taking differently: Take 10 mg by mouth at bedtime.  10/06/18   Chundi, Verne Spurr, MD  Aspirin-Salicylamide-Caffeine (BC HEADACHE POWDER PO) Take 2 packets by mouth daily as needed (severe headache).    [provider]  Cyanocobalamin (VITAMIN B-12 PO) Take 1 tablet by mouth daily.    [provider]  pantoprazole (PROTONIX) 40 MG tablet TAKE 1 TABLET BY MOUTH TWICE A DAY Patient taking differently: Take 40 mg by mouth 2 (two) times daily.  10/06/18  Lorenso Courier, MD  rizatriptan (MAXALT) 5 MG tablet Take 1 tablet (5 mg total) by mouth once as needed for migraine. May repeat in 2 hours if needed 12/18/18 01/17/19  Burna Cash, MD  sodium phosphate (FLEET) 7-19 GM/118ML ENEM Place 133 mLs (1 enema total) rectally once for 1 dose. 02/12/19 02/12/19  Georgetta Haber, NP  spironolactone (ALDACTONE) 25 MG tablet TAKE 1 TABLET (25 MG TOTAL) BY MOUTH DAILY FOR 90 DOSES. Patient taking differently: Take 25 mg by mouth every morning.  10/05/18  01/03/19  Lorenso Courier, MD  SUMAtriptan (IMITREX) 50 MG tablet Take 1 tablet (50 mg total) by mouth once as needed for migraine. May repeat in 2 hours if headache persists or recurs. Patient taking differently: Take 50 mg by mouth See admin instructions. Take one tablet (50 mg) by mouth once as needed for migraine headache, may repeat in 2 hours if headache persists or recurs. 09/22/18 02/11/19  Lorenso Courier, MD  topiramate (TOPAMAX) 25 MG tablet Take 1 tablet (25 mg total) by mouth at bedtime. 12/18/18 12/18/19  Burna Cash, MD    Family History Family History  Problem Relation Age of Onset  . Diabetes Mother   . Hypertension Mother   . Heart attack Mother        2016- 52 yrs  . Breast cancer Maternal Grandmother   . Hypertension Father   . Colon cancer Neg Hx   . Stomach cancer Neg Hx   . Pancreatic cancer Neg Hx     Social History Social History   Tobacco Use  . Smoking status: Former Smoker    Packs/day: 0.10    Types: Cigarettes    Quit date: 04/06/2015    Years since quitting: 3.8  . Smokeless tobacco: Never Used  Substance Use Topics  . Alcohol use: No    Alcohol/week: 0.0 standard drinks  . Drug use: No     Allergies   Penicillins, Lisinopril, Shellfish allergy, Sulfa antibiotics, Iodine, and Keflex [cephalexin]   Review of Systems Review of Systems   Physical Exam Triage Vital Signs ED Triage Vitals  Enc Vitals Group     BP 02/12/19 1130 (!) 159/110     Pulse Rate 02/12/19 1130 66     Resp 02/12/19 1130 18     Temp 02/12/19 1130 97.7 F (36.5 C)     Temp Source 02/12/19 1130 Oral     SpO2 02/12/19 1130 98 %     Weight --      Height --      Head Circumference --      Peak Flow --      Pain Score 02/12/19 1128 7     Pain Loc --      Pain Edu? --      Excl. in GC? --    No data found.  Updated Vital Signs BP (!) 159/110 (BP Location: Left Arm)   Pulse 66   Temp 97.7 F (36.5 C) (Oral)   Resp 18   LMP 02/12/2019   SpO2 98%     Physical Exam Constitutional:      General: She is not in acute distress.    Appearance: She is well-developed.  Cardiovascular:     Rate and Rhythm: Normal rate.  Pulmonary:     Effort: Pulmonary effort is normal.  Abdominal:     Tenderness: There is abdominal tenderness in the epigastric area.  Skin:    General: Skin is warm and dry.  Neurological:  Mental Status: She is alert and oriented to person, place, and time.      UC Treatments / Results  Labs (all labs ordered are listed, but only abnormal results are displayed) Labs Reviewed  GLUCOSE, CAPILLARY  CBG MONITORING, ED    EKG   Radiology DG Abd 2 Views  Result Date: 02/12/2019 CLINICAL DATA:  Concern for constipation. History of kidney stones. EXAM: ABDOMEN - 2 VIEW COMPARISON:  Abdominal radiograph 08/04/2018 FINDINGS: Paucity of bowel gas somewhat limits evaluation but there are no dilated loops of bowel to suggest obstruction. Moderate stool burden. Cannot assess for free air on erect view as diaphragms are excluded from field of view. No radiopaque density is seen projecting over the bilateral kidneys. No acute finding in the visualized skeleton. IMPRESSION: 1.  Nonobstructive bowel gas pattern.  Moderate stool burden. 2.  No radiographic evidence for renal calculi. Electronically Signed   By: Emmaline Kluver M.D.   On: 02/12/2019 12:32    Procedures Procedures (including critical care time)  Medications Ordered in UC Medications - No data to display  Initial Impression / Assessment and Plan / UC Course  I have reviewed the triage vital signs and the nursing notes.  Pertinent labs & imaging results that were available during my care of the patient were reviewed by me and considered in my medical decision making (see chart for details).     Non toxic. Afebrile. Epigastric pain on palpation, otherwise no specific abdominal films and no obvious obstruction via xray today. Dry mouth and increased  thirst, blood sugar normal here today with random POC check. Constipation treatment options discussed. Return precautions provided. Patient verbalized understanding and agreeable to plan.   Final Clinical Impressions(s) / UC Diagnoses   Final diagnoses:  Constipation, unspecified constipation type     Discharge Instructions     Your blood sugar looks well today.  Your abdominal xray is reassuring, there is stool but no visible obstruction.  Use your miralax twice a day. Increase your water consumption. Light and regular activity is also helpful with constipation.  Increase fiber in your diet- raw fruits and vegetables especially.  May continue with twice a day stool softener.  May use the fleets enema today, or use of bisacodyl suppository.  May repeat a full bottle of magnesium citrate if still no results after today.  Continue with miralax and dulcolax twice a day until regular bowel movements.    ED Prescriptions    Medication Sig Dispense Auth. Provider   sodium phosphate (FLEET) 7-19 GM/118ML ENEM Place 133 mLs (1 enema total) rectally once for 1 dose. 1 enema Georgetta Haber, NP     PDMP not reviewed this encounter.   Georgetta Haber, NP 02/12/19 [id number]

## 2019-02-12 NOTE — Discharge Instructions (Signed)
Your blood sugar looks well today.  Your abdominal xray is reassuring, there is stool but no visible obstruction.  Use your miralax twice a day. Increase your water consumption. Light and regular activity is also helpful with constipation.  Increase fiber in your diet- raw fruits and vegetables especially.  May continue with twice a day stool softener.  May use the fleets enema today, or use of bisacodyl suppository.  May repeat a full bottle of magnesium citrate if still no results after today.  Continue with miralax and dulcolax twice a day until regular bowel movements.

## 2019-02-12 NOTE — ED Triage Notes (Signed)
Pt here for constipation onset 8 days associated w/dry mouth, abd pain  LBM = 02/06/2019  Has been increasing fluids  Reports she tested positive for COVID 3 weeks ago  A&O x4... NAD.Marland Kitchen. ambulatory

## 2019-02-14 ENCOUNTER — Encounter: Payer: Self-pay | Admitting: Emergency Medicine

## 2019-02-14 ENCOUNTER — Other Ambulatory Visit: Payer: Self-pay

## 2019-02-14 ENCOUNTER — Emergency Department
Admission: EM | Admit: 2019-02-14 | Discharge: 2019-02-14 | Discharge status: Against Medical Advice | Payer: 59 | Attending: Emergency Medicine | Admitting: Emergency Medicine

## 2019-02-14 DIAGNOSIS — Z5321 Procedure and treatment not carried out due to patient leaving prior to being seen by health care provider: Secondary | ICD-10-CM | POA: Diagnosis not present

## 2019-02-14 DIAGNOSIS — J45909 Unspecified asthma, uncomplicated: Secondary | ICD-10-CM | POA: Diagnosis not present

## 2019-02-14 NOTE — ED Triage Notes (Signed)
Pt c/o asthma flare up and is out of her inhaler. Pt also requesting prednisone.  Pt COVID positive on 02/02/19.

## 2019-02-16 ENCOUNTER — Ambulatory Visit (INDEPENDENT_AMBULATORY_CARE_PROVIDER_SITE_OTHER): Payer: BC Managed Care – PPO | Admitting: Internal Medicine

## 2019-02-16 ENCOUNTER — Encounter: Payer: Self-pay | Admitting: Internal Medicine

## 2019-02-16 VITALS — BP 159/104 | HR 66 | Temp 97.8°F | Ht 66.0 in | Wt 294.0 lb

## 2019-02-16 DIAGNOSIS — J4521 Mild intermittent asthma with (acute) exacerbation: Secondary | ICD-10-CM

## 2019-02-16 DIAGNOSIS — Z79899 Other long term (current) drug therapy: Secondary | ICD-10-CM

## 2019-02-16 DIAGNOSIS — I10 Essential (primary) hypertension: Secondary | ICD-10-CM

## 2019-02-16 DIAGNOSIS — Z6841 Body Mass Index (BMI) 40.0 and over, adult: Secondary | ICD-10-CM

## 2019-02-16 DIAGNOSIS — J45909 Unspecified asthma, uncomplicated: Secondary | ICD-10-CM

## 2019-02-16 DIAGNOSIS — Z8249 Family history of ischemic heart disease and other diseases of the circulatory system: Secondary | ICD-10-CM

## 2019-02-16 MED ORDER — ALBUTEROL SULFATE 1.25 MG/3ML IN NEBU: 1.0000 | INHALATION_SOLUTION | Freq: Four times a day (QID) | RESPIRATORY_TRACT | 12 refills | Status: DC | PRN

## 2019-02-16 MED ORDER — ALBUTEROL SULFATE HFA 108 (90 BASE) MCG/ACT IN AERS: 2.0000 | INHALATION_SPRAY | Freq: Four times a day (QID) | RESPIRATORY_TRACT | 11 refills | Status: DC | PRN

## 2019-02-16 MED ORDER — PREDNISONE 20 MG PO TABS: 40.0000 mg | ORAL_TABLET | Freq: Every day | ORAL | 0 refills | Status: AC

## 2019-02-16 MED ORDER — LOSARTAN POTASSIUM 50 MG PO TABS: 50.0000 mg | ORAL_TABLET | Freq: Every day | ORAL | 0 refills | Status: DC

## 2019-02-16 MED ORDER — LOSARTAN POTASSIUM 50 MG PO TABS: 25.0000 mg | ORAL_TABLET | Freq: Every day | ORAL | 0 refills | Status: DC

## 2019-02-16 NOTE — Progress Notes (Signed)
   CC: Asthma and HTN  HPI:  [name] is a 39 y.o. female with PMHx listed below presenting for Asthma and HTN. Please see the A&P for the status of the patient's chronic medical problems.  Past Medical History:  Diagnosis Date  . Anemia   . Asthma   . Hypertension   . LUQ abdominal pain 10/16/2017  . Microcytic anemia 12/01/2014  . Obesity    Review of Systems:  Performed and all others negative.  Physical Exam: Vitals:   02/16/19 1047  BP: (!) 159/104  Pulse: 66  Temp: 97.8 F (36.6 C)  TempSrc: Oral  SpO2: 100%  Weight: 294 lb (133.4 kg)  Height: 5\' 6"  (1.676 m)   General: Obese female in no acute distress Pulm: Good air movement with no wheezing or crackles  CV: RRR, no murmurs, no rubs   Assessment & Plan:   See Encounters Tab for problem based charting.  Patient discussed with Dr. 

## 2019-02-16 NOTE — Progress Notes (Signed)
Internal Medicine Clinic Attending  Case discussed with Dr. Helberg at the time of the visit.  We reviewed the resident's history and exam and pertinent patient test results.  I agree with the assessment, diagnosis, and plan of care documented in the resident's note.    

## 2019-02-16 NOTE — Assessment & Plan Note (Signed)
Patient expresses concerns about her uncontrolled hypertension in her recent hospitalization for hypertension. She states that she has been on blood pressure medication for the past three years and she has never seen at less than 130/80. She takes her medication consistently. She is currently on amlodipine 10 mg and spironolactone 25 mg daily. The spironolactone was originally started for PCOS. She has been on an ACE inhibitor in the past however developed cough and so she has been off this.  She is morbidly obese but has lost 30 pounds over the past year and is working on continuing to lose weight. She does not consume a high salt diet. She denies signs and symptoms of OSA. She does not use stimulants including caffeine or nicotine. She does not consume high amounts of alcohol.  Her family history is significant for early hypertension. Her mother was diagnosed with hypertension and her mid-20s and subsequently had to CVAs by the age of 86. Her father was diagnosed with hypertension in his 30s.  I reviewed her previous records. She is stable renal function and potassium. She had a TSH checked in the past which was normal.  A/P: - Will check an aldosterone to read and ratio. The patient is on spironolactone so we will need to adjust for this. - Check renal duplex to evaluate for renal artery stenosis - Start losartan 50 mg once daily. Patient will need to come back in four weeks for repeat blood pressure check and additional labs. - Continue amlodipine 10 mg and spironolactone 25 mg daily

## 2019-02-16 NOTE — Patient Instructions (Signed)
Thank you for allowing [place] to provide your care. Today we're gonna do some further evaluation for your hypertension and start a new medication called losartan. I will need you to come back in four weeks for repeat blood pressure check and repeat labs. Continue to check her blood pressure at home and call us in 1 to 2 weeks with an average.

## 2019-02-16 NOTE — Assessment & Plan Note (Signed)
Patient with a history of mild intermittent asthma managed with only PRN albuterol. She is been out of her albuterol for the past couple weeks. Approximately four days ago she developed rhinorrhea in a postnasal drip. Since that time she has had increased nocturnal symptoms and chest tightness. In the past she is coming to get a Kenalog shot and steroid taper. She denies fevers, chills, daytime symptoms. On physical exam there is no wheezing noted.  A/P: - Short 5 day course of prednisone  - Refill albuterol (inhaler and nebulizer)

## 2019-02-21 LAB — ALDOSTERONE + RENIN ACTIVITY W/ RATIO
ALDOSTERONE: <1 ng/dL (ref 0.0–30.0)
Renin: <0.167 ng/mL/hr — ABNORMAL LOW (ref 0.167–5.380)

## 2019-02-22 ENCOUNTER — Telehealth: Payer: Self-pay | Admitting: *Deleted

## 2019-02-22 NOTE — Telephone Encounter (Signed)
Pt needs paperwork from [employer] filled out for return to work, recently out for +COVID, she will bring to office 1/12, it can be faxed back to [employer] and then copy mailed to her

## 2019-02-23 NOTE — Addendum Note (Signed)
Addended by: Remus Blake on: 02/23/2019 04:14 PM   Modules accepted: Orders

## 2019-03-02 ENCOUNTER — Telehealth: Payer: Self-pay | Admitting: Internal Medicine

## 2019-03-02 ENCOUNTER — Encounter: Payer: Self-pay | Admitting: Internal Medicine

## 2019-03-02 NOTE — Telephone Encounter (Signed)
Pt need a note to return to work after having COVID pls contact [phone]

## 2019-03-03 NOTE — Progress Notes (Signed)
Letter for patient to provide her workplace regarding return to work is in this encounter.   Lorenso Courier, MD Internal Medicine PGY3

## 2019-03-03 NOTE — Telephone Encounter (Signed)
Attempted to callback patient, could not be reached. Agree with [facility] visit. Please make sure they print the letter and give it to her at acc appt 1/21

## 2019-03-03 NOTE — Telephone Encounter (Signed)
RTC, pt is crying, states she is still not feeling well from when she had Covid on 02/02/19.  C/O fatigue, crying because she still can't taste anything, anxiety.  Request to be seen.  Also states she has been asking for a return to work letter.  Informed patient letter is in my chart.  Pt states she has a form that needs to  Be filled out as well and will have form with her tomorrow.  Appt given tomorrow in [facility] at [address], RN,BSN

## 2019-03-03 NOTE — Telephone Encounter (Signed)
Please find letter in chart. It can be given to patient and she can choose whom she wants to disclose to. Thank you!

## 2019-03-03 NOTE — Telephone Encounter (Signed)
Letter printed and placed in envelope, given to [facility] for patient to pick up at [facility] appt tomorrow. SChaplin, RN,BSN

## 2019-03-03 NOTE — Telephone Encounter (Signed)
Pt is calling back [phone]

## 2019-03-03 NOTE — Telephone Encounter (Signed)
I will provide a letter stating she has completed her quarantine

## 2019-03-04 ENCOUNTER — Ambulatory Visit (HOSPITAL_COMMUNITY)
Admission: RE | Admit: 2019-03-04 | Discharge: 2019-03-04 | Disposition: A | Discharge status: Home / Self Care | Payer: 59 | Source: Ambulatory Visit | Attending: Internal Medicine | Admitting: Internal Medicine

## 2019-03-04 ENCOUNTER — Encounter: Payer: Self-pay | Admitting: Internal Medicine

## 2019-03-04 ENCOUNTER — Other Ambulatory Visit: Payer: Self-pay

## 2019-03-04 ENCOUNTER — Ambulatory Visit: Payer: BC Managed Care – PPO | Admitting: Internal Medicine

## 2019-03-04 VITALS — BP 155/93 | HR 74 | Temp 97.9°F | Ht 66.0 in | Wt 291.4 lb

## 2019-03-04 DIAGNOSIS — K228 Other specified diseases of esophagus: Secondary | ICD-10-CM

## 2019-03-04 DIAGNOSIS — J4520 Mild intermittent asthma, uncomplicated: Secondary | ICD-10-CM

## 2019-03-04 DIAGNOSIS — F419 Anxiety disorder, unspecified: Secondary | ICD-10-CM | POA: Insufficient documentation

## 2019-03-04 DIAGNOSIS — I10 Essential (primary) hypertension: Secondary | ICD-10-CM | POA: Insufficient documentation

## 2019-03-04 DIAGNOSIS — K5900 Constipation, unspecified: Secondary | ICD-10-CM

## 2019-03-04 DIAGNOSIS — Z79899 Other long term (current) drug therapy: Secondary | ICD-10-CM

## 2019-03-04 DIAGNOSIS — J45909 Unspecified asthma, uncomplicated: Secondary | ICD-10-CM

## 2019-03-04 DIAGNOSIS — R079 Chest pain, unspecified: Secondary | ICD-10-CM

## 2019-03-04 DIAGNOSIS — K219 Gastro-esophageal reflux disease without esophagitis: Secondary | ICD-10-CM

## 2019-03-04 DIAGNOSIS — Z8616 Personal history of COVID-19: Secondary | ICD-10-CM

## 2019-03-04 DIAGNOSIS — Z87891 Personal history of nicotine dependence: Secondary | ICD-10-CM

## 2019-03-04 DIAGNOSIS — K30 Functional dyspepsia: Secondary | ICD-10-CM

## 2019-03-04 DIAGNOSIS — I160 Hypertensive urgency: Secondary | ICD-10-CM

## 2019-03-04 MED ORDER — PREDNISONE 10 MG (21) PO TBPK: ORAL_TABLET | ORAL | 0 refills | Status: AC

## 2019-03-04 MED ORDER — SIMETHICONE 80 MG PO CHEW: 80.0000 mg | CHEWABLE_TABLET | Freq: Four times a day (QID) | ORAL | 0 refills | Status: DC | PRN

## 2019-03-04 NOTE — Patient Instructions (Signed)
Thank you, [name] for allowing [place] to provide your care today. Today we discussed COVID infection, Asthma, Abdominal/Chestpain.    I have ordered none labs for you. I will call if any are abnormal.    I have place a referrals to [employer] for anxiety.   I have ordered the following tests: EKG   I have ordered the following medication/changed the following medications:  1. Simethicone for your abdominal pain and bloating 2. Miralax for constipation 3. Prednisone dose taper pack for asthma symptoms.   Please follow-up in as needed.    Should you have any questions or concerns please call the internal medicine clinic at [phone].    Dellia Cloud, D.O. [facility] Internal Medicine

## 2019-03-04 NOTE — Progress Notes (Signed)
   CC: Asthma   HPI:  [name] is a 39 y.o. female with a past medical history stated below and presents today for asthma. Please see problem based assessment and plan for additional details.   Past Medical History:  Diagnosis Date  . Anemia   . Asthma   . Hypertension   . LUQ abdominal pain 10/16/2017  . Microcytic anemia 12/01/2014  . Obesity      Review of Systems: ROS   Vitals:   03/04/19 1052  BP: (!) 171/117  Pulse: 87  Temp: 97.9 F (36.6 C)  TempSrc: Oral  SpO2: 100%  Weight: 291 lb 6.4 oz (132.2 kg)  Height: 5\' 6"  (1.676 m)   Mom with strokes aunt with heart attach.  Having palpitations right now. Smoked for a few years, black and milds, 2 a day,not for 4 years.  No diabetes.  Has GERD with EGD. No real problem swallowing.  No HLD Obese,  HTN  She is having some constipation - OTC enema without success, milk of magnesia, last BM was on Sunday. Does have belly pain that gets better with bowel movement. Has a history of constipation predominant symptoms.   Significant anxiety - want to talk to [place]    Physical Exam: Physical Exam  Constitutional: She is oriented to person, place, and time and well-developed, well-nourished, and in no distress.  HENT:  Head: Normocephalic and atraumatic.  Eyes: EOM are normal.  Cardiovascular: Normal rate and regular rhythm. Exam reveals no gallop and no friction rub.  No murmur heard. Pulmonary/Chest: She is in respiratory distress (Becomes dyspnic easily with talking and exertion). She exhibits no tenderness.  Prolonged exhalation  Abdominal: Soft. She exhibits no distension. There is no abdominal tenderness.  Musculoskeletal:        General: No tenderness or edema. Normal range of motion.  Neurological: She is alert and oriented to person, place, and time.  Skin: Skin is warm and dry.     Assessment & Plan:   See Encounters Tab for problem based charting.  Patient discussed with Dr.  Friday

## 2019-03-08 ENCOUNTER — Ambulatory Visit (HOSPITAL_COMMUNITY): Admission: RE | Admit: 2019-03-08 | Payer: 59 | Source: Ambulatory Visit

## 2019-03-08 NOTE — Progress Notes (Signed)
Internal Medicine Clinic Attending  Case discussed with Dr. Coe at the time of the visit.  We reviewed the resident's history and exam and pertinent patient test results.  I agree with the assessment, diagnosis, and plan of care documented in the resident's note.    

## 2019-03-08 NOTE — Assessment & Plan Note (Signed)
Patient states that she is having abdominal pain that she described as epigastric and bloating. She does have a history of GERD, but denies belching or regurgitation with this pain. She believes that having a BM can make it feel better, but she states that she does not have a consistent bowel movement daily. She is not currently on a bowel regiment to help her have a BM.  Plan: - Prescribe simethicone for gas - Counseled her on the importance of using miralax daily with the goal of a BM each day. - Told her to record her food to see if there is any association that would intake SIBO, malabsorption, or IBS.

## 2019-03-08 NOTE — Assessment & Plan Note (Signed)
Patient continues to have significant anxiety. Particularly about her recent COVID 19 infections. She is concerned that she will not get her sense of smell or taste back. She is currently taking Zoloft 50 mg. She believes it helps.   Plan: - Referral to [facility] for counseling.

## 2019-03-08 NOTE — Assessment & Plan Note (Signed)
Patient continues to has elevated blood pressure at 155/93. She was recently started on Losartan 25 mg. She states that she thought she was suppose to take a half tablet daily. I confirmed that she should be on the full dose of losartan 25 mg daily. She will likely need further therapy for HTN.  Plan: - Continue amlodipine 10 mg  - Continue losartan 50 mg

## 2019-03-08 NOTE — Assessment & Plan Note (Signed)
Patient continues to have shortness of breath since being diagnosed with COVID19 URI. She states that she becomes dyspneic with exertion and while talking. She is having to use albuterol nebulizer and her rescue inhaler more frequently then usually. She uses her nebulizer twice daily and her inhaler 3 times daily. She denies productive cough, fever. She admits to fatigue and malaise, which is likely residual symptoms from her recent COVID infection.  Plan: - 12 day course of prednisone taper.

## 2019-03-11 IMAGING — CT CT RENAL STONE PROTOCOL
2 of 4 series · 16 of 46 positions shown, 18 images · non-contrast
Comparison: CT of the abdomen and pelvis performed 06/14/2016, and
pelvic ultrasound performed 06/17/2016

CLINICAL DATA: Subacute onset of vomiting. Left flank pain. Left
lower quadrant and epigastric abdominal pain.

EXAM:
CT ABDOMEN AND PELVIS WITHOUT CONTRAST
TECHNIQUE: Multidetector CT imaging of the abdomen and pelvis was performed
following the standard protocol without IV contrast.

[Series 3: stone study 5.0 i30f 2 · axial · 0.98mm/px · z∈[+767,+1222]mm · 13 of 99 slices shown, 15 images]
[im 4/99  soft-tissue]
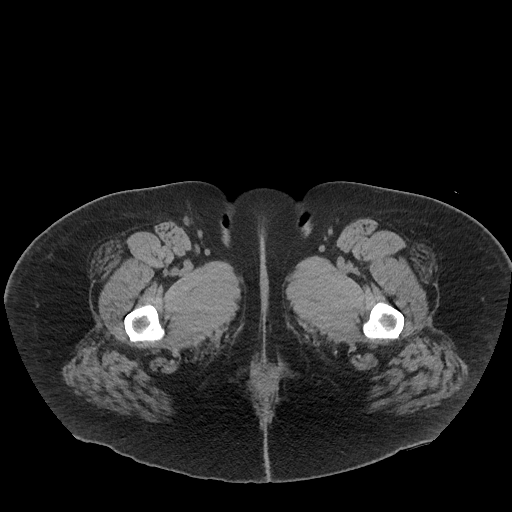
[im 4/99  bone]
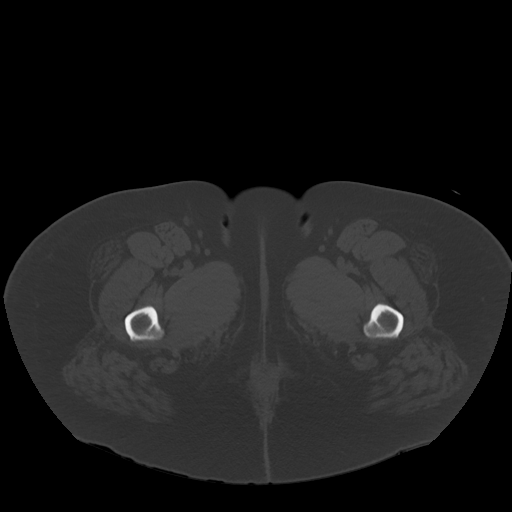
[im 12/99  soft-tissue]
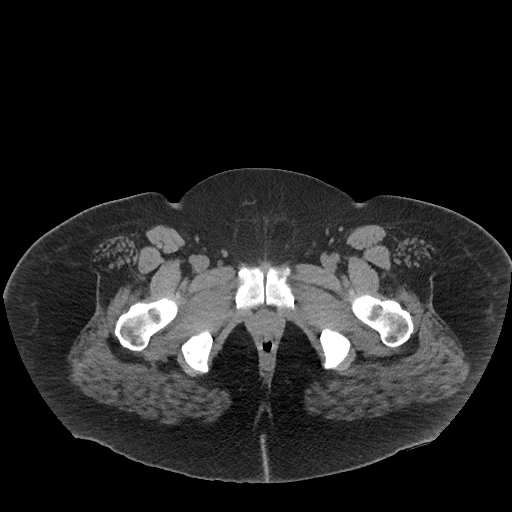
[im 20/99  soft-tissue]
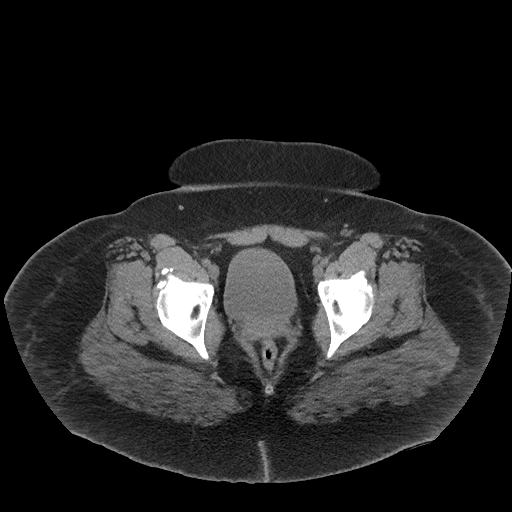
[im 28/99  soft-tissue]
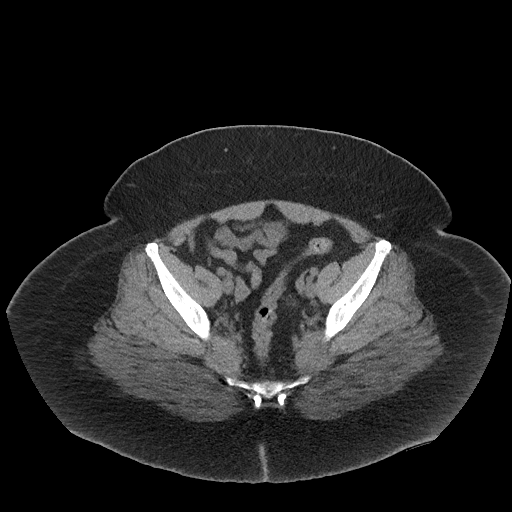
[im 36/99  soft-tissue]
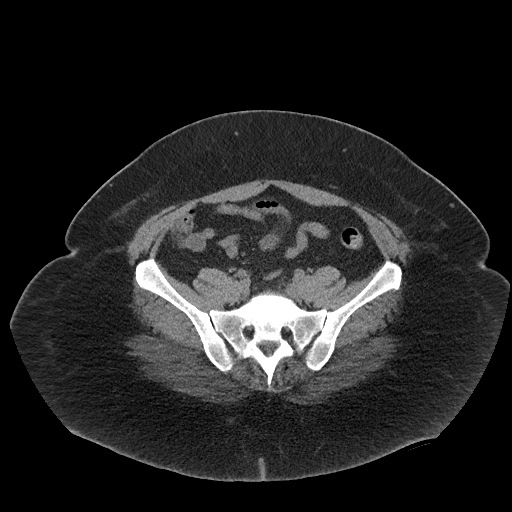
[im 44/99  soft-tissue]
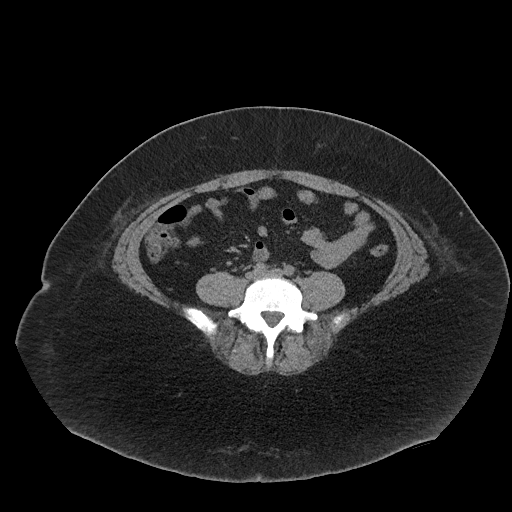
[im 51/99  soft-tissue]
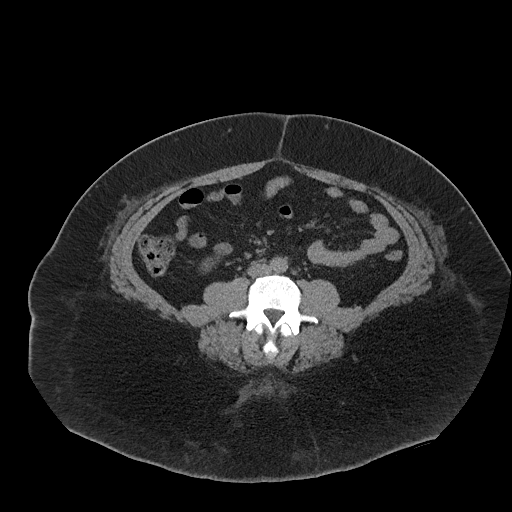
[im 55/99  soft-tissue]
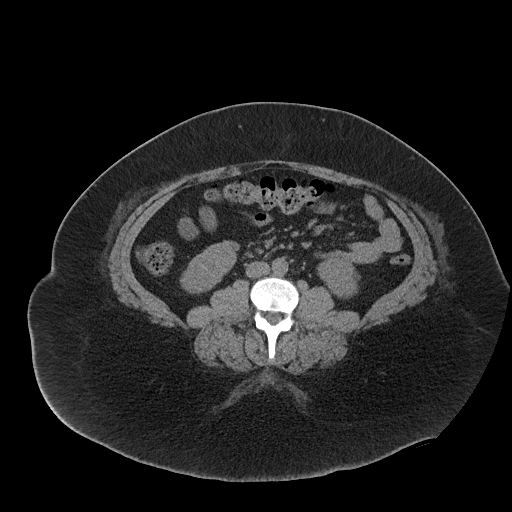
[im 63/99  soft-tissue]
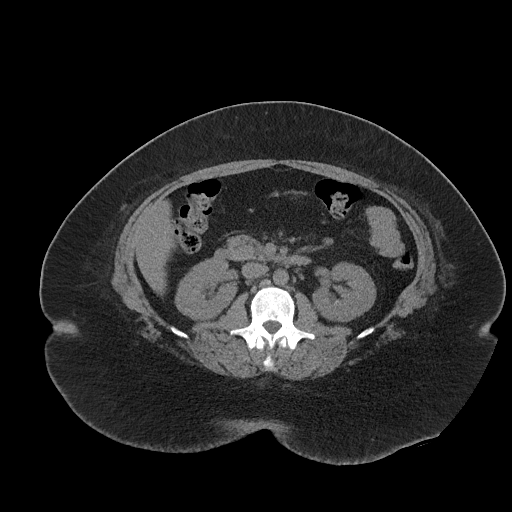
[im 63/99  bone]
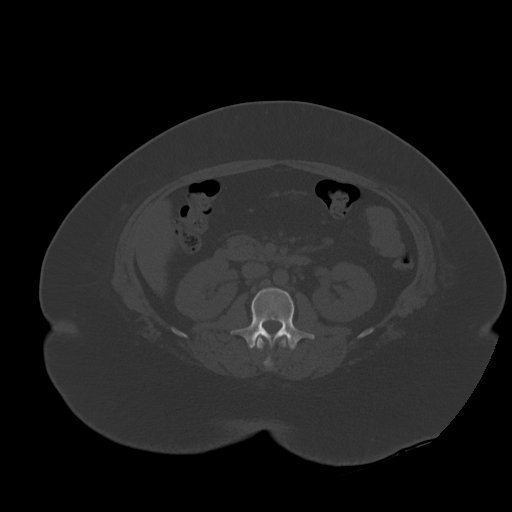
[im 71/99  soft-tissue]
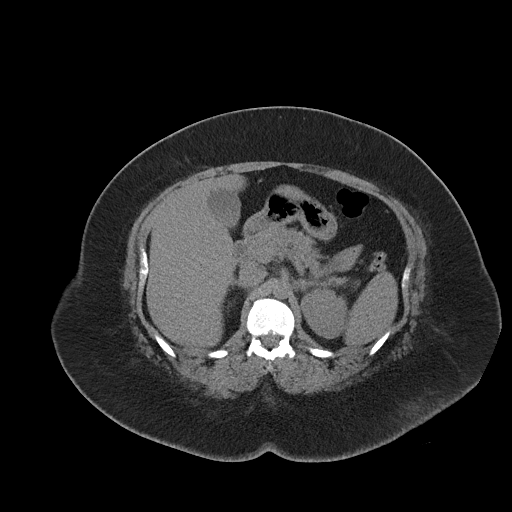
[im 79/99  soft-tissue]
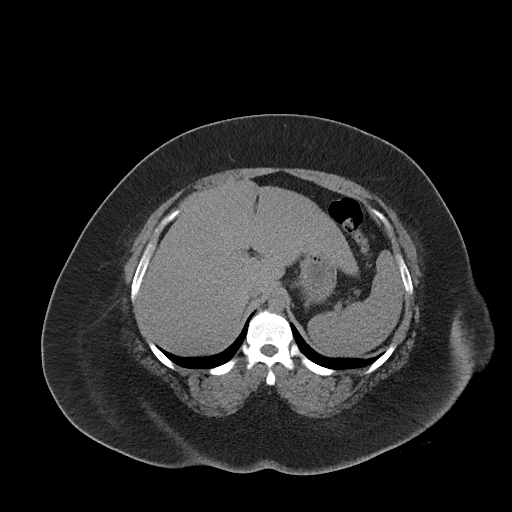
[im 87/99  soft-tissue]
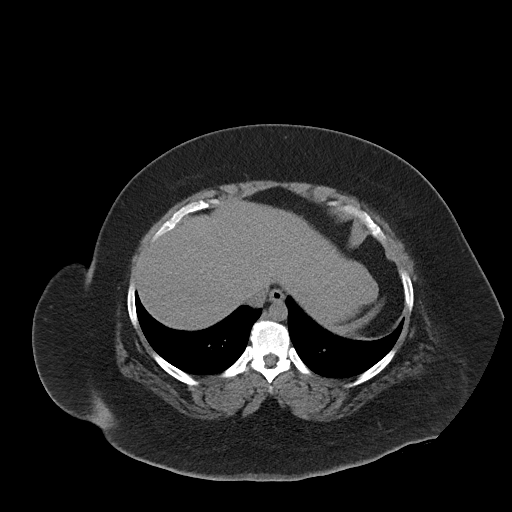
[im 95/99  soft-tissue]
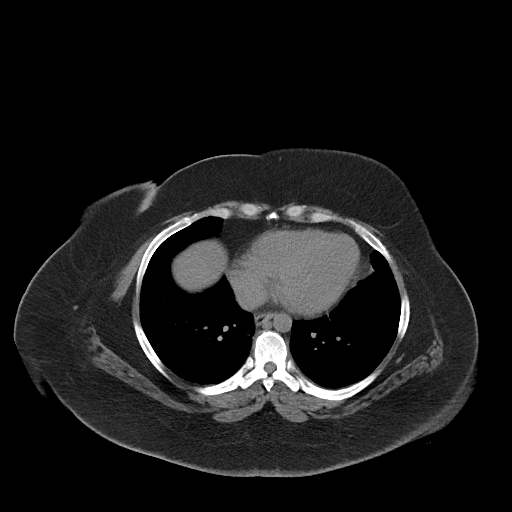

[Series 6: coronal soft tissue · coronal · 0.97mm/px · 3 of 122 slices shown]
[im 41/122  soft-tissue]
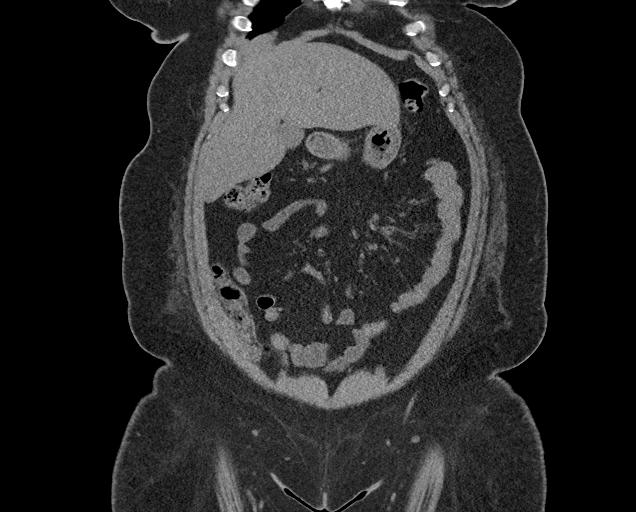
[im 54/122  soft-tissue]
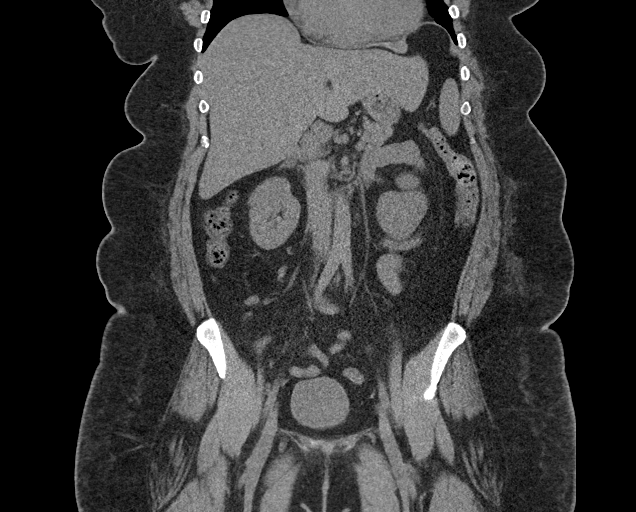
[im 68/122  soft-tissue]
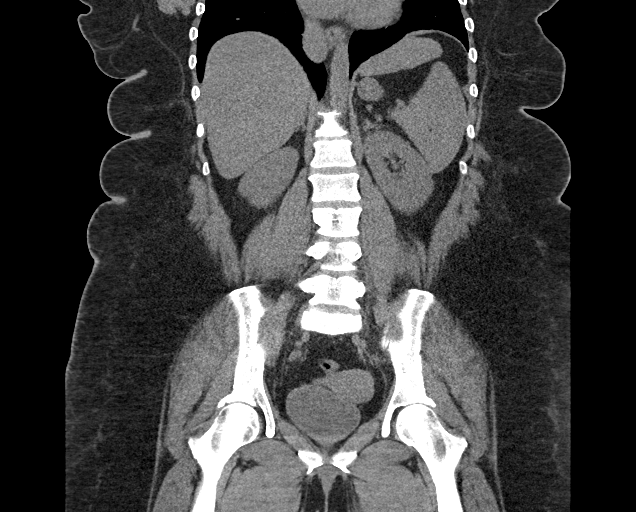

[16 of 46 positions shown; findings below may reference images not displayed]

FINDINGS: Lower chest: The visualized lung bases are grossly clear. The
visualized portions of the mediastinum are unremarkable.

Hepatobiliary: The liver is unremarkable in appearance. The
gallbladder is unremarkable in appearance. The common bile duct
remains normal in caliber.

Pancreas: The pancreas is within normal limits.

Spleen: The spleen is unremarkable in appearance.

Adrenals/Urinary Tract: The adrenal glands are unremarkable in
appearance.

Nonobstructing bilateral renal stones measure up to 6 mm in size.
There is no evidence of hydronephrosis. No obstructing ureteral
stones are identified. No perinephric stranding is seen.

Stomach/Bowel: The stomach is unremarkable in appearance. The small
bowel is within normal limits. The appendix is normal in caliber,
without evidence of appendicitis. The colon is unremarkable in
appearance.

Vascular/Lymphatic: The abdominal aorta is unremarkable in
appearance. The inferior vena cava is grossly unremarkable. No
retroperitoneal lymphadenopathy is seen. No pelvic sidewall
lymphadenopathy is identified.

Reproductive: The bladder is mildly distended and grossly
unremarkable. The uterus is grossly unremarkable, though not fully
characterized without contrast. No suspicious adnexal masses are
seen. The ovaries are relatively symmetric.

Other: No additional soft tissue abnormalities are seen.

Musculoskeletal: No acute osseous abnormalities are identified. The
visualized musculature is unremarkable in appearance.
IMPRESSION: 1. No acute abnormality seen within the abdomen or pelvis.
2. Nonobstructing bilateral renal stones measure up to 6 mm in size.

## 2019-03-12 ENCOUNTER — Other Ambulatory Visit: Payer: Self-pay

## 2019-03-12 ENCOUNTER — Encounter: Payer: Self-pay | Admitting: Emergency Medicine

## 2019-03-12 ENCOUNTER — Telehealth: Payer: Self-pay | Admitting: Emergency Medicine

## 2019-03-12 ENCOUNTER — Emergency Department: Payer: 59

## 2019-03-12 ENCOUNTER — Emergency Department
Admission: EM | Admit: 2019-03-12 | Discharge: 2019-03-12 | Disposition: A | Discharge status: Home / Self Care | Payer: 59 | Attending: Emergency Medicine | Admitting: Emergency Medicine

## 2019-03-12 DIAGNOSIS — Z5321 Procedure and treatment not carried out due to patient leaving prior to being seen by health care provider: Secondary | ICD-10-CM | POA: Diagnosis not present

## 2019-03-12 DIAGNOSIS — R079 Chest pain, unspecified: Secondary | ICD-10-CM | POA: Diagnosis present

## 2019-03-12 LAB — TROPONIN I (HIGH SENSITIVITY): Troponin I (High Sensitivity): <2 ng/L (ref <18)

## 2019-03-12 LAB — BASIC METABOLIC PANEL
Anion gap: 9 (ref 5–15)
BUN: 10 mg/dL (ref 6–20)
CO2: 20 mmol/L — ABNORMAL LOW (ref 22–32)
Calcium: 8.8 mg/dL — ABNORMAL LOW (ref 8.9–10.3)
Chloride: 108 mmol/L (ref 98–111)
Creatinine, Ser: 0.71 mg/dL (ref 0.44–1.00)
GFR calc Af Amer: >60 mL/min (ref >60)
GFR calc non Af Amer: >60 mL/min (ref >60)
Glucose, Bld: 130 mg/dL — ABNORMAL HIGH (ref 70–99)
Potassium: 3.6 mmol/L (ref 3.5–5.1)
Sodium: 137 mmol/L (ref 135–145)

## 2019-03-12 LAB — CBC
HCT: 36.5 % (ref 36.0–46.0)
Hemoglobin: 10.9 g/dL — ABNORMAL LOW (ref 12.0–15.0)
MCH: 22.1 pg — ABNORMAL LOW (ref 26.0–34.0)
MCHC: 29.9 g/dL — ABNORMAL LOW (ref 30.0–36.0)
MCV: 74 fL — ABNORMAL LOW (ref 80.0–100.0)
Platelets: 294 10*3/uL (ref 150–400)
RBC: 4.93 MIL/uL (ref 3.87–5.11)
RDW: 18 % — ABNORMAL HIGH (ref 11.5–15.5)
WBC: 9.7 10*3/uL (ref 4.0–10.5)
nRBC: 0 % (ref 0.0–0.2)

## 2019-03-12 MED ORDER — SODIUM CHLORIDE 0.9% FLUSH: 3.0000 mL | Freq: Once | INTRAVENOUS | Status: DC

## 2019-03-12 NOTE — ED Triage Notes (Signed)
Patient coming in for chest pain in central chest and to the left side. Patient stated the pain started this at 11am yesterday and she took heartburn medication but it did not help and pain woke her up from sleeping.Patient states pain is tight and stabbing denies shortness of breath.

## 2019-03-12 NOTE — Telephone Encounter (Signed)
Called patient due to lwot to inquire about condition and follow up plans. Left message.   

## 2019-03-12 NOTE — ED Notes (Signed)
Pt reports leaving now & will f/u with PCP in the morning

## 2019-03-17 ENCOUNTER — Encounter: Payer: Self-pay | Admitting: Licensed Clinical Social Worker

## 2019-03-17 ENCOUNTER — Telehealth: Payer: Self-pay | Admitting: Licensed Clinical Social Worker

## 2019-03-17 NOTE — Telephone Encounter (Signed)
Patient was called due to a referral for services. Pt did not answer, and a vm was left for the patient to call the office to schedule a future appointment.

## 2019-03-25 ENCOUNTER — Encounter: Payer: BC Managed Care – PPO | Admitting: Internal Medicine

## 2019-04-08 ENCOUNTER — Ambulatory Visit (INDEPENDENT_AMBULATORY_CARE_PROVIDER_SITE_OTHER): Payer: 59 | Admitting: Internal Medicine

## 2019-04-08 ENCOUNTER — Other Ambulatory Visit: Payer: Self-pay

## 2019-04-08 DIAGNOSIS — G43119 Migraine with aura, intractable, without status migrainosus: Secondary | ICD-10-CM | POA: Diagnosis not present

## 2019-04-08 DIAGNOSIS — I10 Essential (primary) hypertension: Secondary | ICD-10-CM

## 2019-04-08 NOTE — Progress Notes (Signed)
   CC: Hypertension and migraine follow up  HPI:  [name] is a 39 y.o. essential htn, pcos, mdd, prediabetes who presents for follow up of hypertension follow up. Please see problem based charting for evaluation, assessment, and plan.   Past Medical History:  Diagnosis Date  . Anemia   . Asthma   . Hypertension   . LUQ abdominal pain 10/16/2017  . Microcytic anemia 12/01/2014  . Obesity    Review of Systems:   Has headache Denies nausea, vomiting, abd pain  Physical Exam:  There were no vitals filed for this visit. Telemedicine visit  Assessment & Plan:   See Encounters Tab for problem based charting.  Patient discussed with Dr. Antony Booth

## 2019-04-09 ENCOUNTER — Encounter (HOSPITAL_COMMUNITY): Payer: Self-pay

## 2019-04-09 ENCOUNTER — Ambulatory Visit (HOSPITAL_COMMUNITY)
Admission: EM | Admit: 2019-04-09 | Discharge: 2019-04-09 | Disposition: A | Discharge status: Home / Self Care | Payer: 59

## 2019-04-09 ENCOUNTER — Telehealth: Payer: Self-pay | Admitting: Internal Medicine

## 2019-04-09 ENCOUNTER — Other Ambulatory Visit: Payer: Self-pay

## 2019-04-09 DIAGNOSIS — J019 Acute sinusitis, unspecified: Secondary | ICD-10-CM

## 2019-04-09 MED ORDER — DOXYCYCLINE HYCLATE 100 MG PO CAPS: 100.0000 mg | ORAL_CAPSULE | Freq: Two times a day (BID) | ORAL | 0 refills | Status: DC

## 2019-04-09 NOTE — ED Triage Notes (Addendum)
Pt c/o HA, sinus congestion, nose bleeds onset Monday. Denies abd pain, n/v/d, sore throat, cough, sob. Reports h/o sinus infections.  Took Claritin with some improvement.  Had COVID in late December.  States HA is front facial area and states it doesn't "feel like a migraine, it feels like my sinuses.".

## 2019-04-09 NOTE — Discharge Instructions (Addendum)
You have a sinus infection. I have sent in doxycyline twice a day for 10 days.   I have also sent in fluconazole for potential yeast infection.  Follow up with primary care if symptoms are not improving by Monday.

## 2019-04-09 NOTE — Telephone Encounter (Signed)
Rec'd call from the patient following up with the FMLA forms. Pt states she was to call back to confirm forms were rec'd.  The FMLA forms have been rec'd and placed in your box.  Please call patient back to follow up as patient states she had a Telehealth visit yesterday 04/08/2019.

## 2019-04-09 NOTE — ED Provider Notes (Signed)
[facility]    CSN: [id number] Arrival date & time: 04/09/19  1244      History   Chief Complaint Chief Complaint  Patient presents with  . Headache  . Nasal Congestion    HPI Victoria Booth is a 39 y.o. female.   Patient reports nasal congestion, cough, headaches, sinus pain and pressure for the last 6 days.  She has a history of sinus infections.  She also has history of hypertension.  Denies fever, muscle aches, nausea, vomiting, diarrhea, rash, other symptoms.  ROS Per HPI  The history is provided by the patient.    Past Medical History:  Diagnosis Date  . Anemia   . Asthma   . Hypertension   . LUQ abdominal pain 10/16/2017  . Microcytic anemia 12/01/2014  . Obesity     Patient Active Problem List   Diagnosis Date Noted  . Anxiety 03/04/2019  . Hypertensive urgency 12/14/2018  . Vitamin D deficiency 08/04/2018  . Nephrolithiasis 07/14/2018  . Functional dyspepsia 02/20/2018  . IBS (irritable colon syndrome) 02/20/2018  . Iron deficiency anemia 08/18/2017  . Acute pain of left shoulder 08/18/2017  . Insomnia 07/29/2017  . Left lower quadrant abdominal pain of unknown etiology 07/13/2017  . OSA (obstructive sleep apnea) 06/05/2017  . Major depressive disorder 03/21/2017  . Migraine 10/24/2016  . Hematuria 09/24/2016  . Prediabetes 08/21/2016  . Allergic rhinitis 06/18/2016  . Left flank pain 06/05/2016  . PCOS (polycystic ovarian syndrome) 04/05/2016  . Essential hypertension, benign 12/01/2014  . Asthma, chronic 12/01/2014  . Gastroesophageal reflux disease 12/01/2014  . Preventative health care 12/01/2014    Past Surgical History:  Procedure Laterality Date  . EXTRACORPOREAL SHOCK WAVE LITHOTRIPSY Left 07/16/2018   Procedure: EXTRACORPOREAL SHOCK WAVE LITHOTRIPSY (ESWL);  Surgeon: Vanna Scotland, MD;  Location: [facility] ORS;  Service: Urology;  Laterality: Left;  . TOENAIL EXCISION      OB History   No obstetric history on file.      Home Medications    Prior to Admission medications   Medication Sig Start Date End Date Taking? Authorizing Provider  albuterol (ACCUNEB) 1.25 MG/3ML nebulizer solution Take 3 mLs (1.25 mg total) by nebulization every 6 (six) hours as needed for wheezing. 02/16/19  Yes Helberg, Jill Alexanders, MD  albuterol (VENTOLIN HFA) 108 (90 Base) MCG/ACT inhaler Inhale 2 puffs into the lungs every 6 (six) hours as needed for wheezing or shortness of breath. 02/16/19  Yes Helberg, Jill Alexanders, MD  amLODipine (NORVASC) 10 MG tablet TAKE 1 TABLET BY MOUTH EVERY DAY Patient taking differently: Take 10 mg by mouth at bedtime.  10/06/18  Yes Chundi, Sherlyn Lees, MD  Aspirin-Salicylamide-Caffeine (BC HEADACHE POWDER PO) Take 2 packets by mouth daily as needed (severe headache).   Yes [provider]  losartan (COZAAR) 50 MG tablet Take 1 tablet (50 mg total) by mouth daily. 02/16/19  Yes Helberg, Jill Alexanders, MD  pantoprazole (PROTONIX) 40 MG tablet TAKE 1 TABLET BY MOUTH TWICE A DAY Patient taking differently: Take 40 mg by mouth 2 (two) times daily.  10/06/18  Yes Chundi, Vahini, MD  spironolactone (ALDACTONE) 25 MG tablet TAKE 1 TABLET (25 MG TOTAL) BY MOUTH DAILY FOR 90 DOSES. Patient taking differently: Take 25 mg by mouth every morning.  10/05/18 04/09/19 Yes Chundi, Vahini, MD  SUMAtriptan (IMITREX) 50 MG tablet Take 1 tablet (50 mg total) by mouth once as needed for migraine. May repeat in 2 hours if headache persists or recurs. Patient taking differently: Take 50  mg by mouth See admin instructions. Take one tablet (50 mg) by mouth once as needed for migraine headache, may repeat in 2 hours if headache persists or recurs. 09/22/18 04/09/19 Yes Chundi, Vahini, MD  topiramate (TOPAMAX) 25 MG tablet Take 1 tablet (25 mg total) by mouth at bedtime. 12/18/18 12/18/19 Yes Santos-Sanchez, Merlene Morse, MD  acetaminophen (TYLENOL) 500 MG tablet Take 1,000 mg by mouth every 6 (six) hours as needed for headache.    [provider]   Cyanocobalamin (VITAMIN B-12 PO) Take 1 tablet by mouth daily.    [provider]  doxycycline (VIBRAMYCIN) 100 MG capsule Take 1 capsule (100 mg total) by mouth 2 (two) times daily. 04/09/19   Faustino Congress, NP  predniSONE (DELTASONE) 10 MG tablet  03/04/19   [provider]  sertraline (ZOLOFT) 50 MG tablet Take 50 mg by mouth daily. 12/27/18   [provider]  simethicone (GAS-X) 80 MG chewable tablet Chew 1 tablet (80 mg total) by mouth every 6 (six) hours as needed for flatulence. 03/04/19   Marianna Payment, MD    Family History Family History  Problem Relation Age of Onset  . Diabetes Mother   . Hypertension Mother   . Heart attack Mother        2016- 30 yrs  . Breast cancer Maternal Grandmother   . Hypertension Father   . Colon cancer Neg Hx   . Stomach cancer Neg Hx   . Pancreatic cancer Neg Hx     Social History Social History   Tobacco Use  . Smoking status: Former Smoker    Packs/day: 0.10    Types: Cigarettes    Quit date: 04/06/2015    Years since quitting: 4.0  . Smokeless tobacco: Never Used  Substance Use Topics  . Alcohol use: No    Alcohol/week: 0.0 standard drinks  . Drug use: No     Allergies   Penicillins, Lisinopril, Shellfish allergy, Sulfa antibiotics, Iodine, and Keflex [cephalexin]   Review of Systems Review of Systems   Physical Exam Triage Vital Signs ED Triage Vitals [04/09/19 1256]  Enc Vitals Group     BP      Pulse      Resp      Temp      Temp src      SpO2      Weight      Height      Head Circumference      Peak Flow      Pain Score 7     Pain Loc      Pain Edu?      Excl. in [place]?    No data found.  Updated Vital Signs BP (!) 159/95 (BP Location: Right Arm)   Pulse 81   Temp 97.7 F (36.5 C) (Oral)   Resp 18   SpO2 97%   Visual Acuity Right Eye Distance:   Left Eye Distance:   Bilateral Distance:    Right Eye Near:   Left Eye Near:    Bilateral Near:     Physical  Exam Vitals and nursing note reviewed.  Constitutional:      General: She is not in acute distress.    Appearance: She is well-developed.  HENT:     Head: Normocephalic and atraumatic.     Right Ear: Tympanic membrane normal.     Left Ear: Tympanic membrane normal.     Nose: Congestion present.     Right Sinus: Maxillary sinus tenderness  and frontal sinus tenderness present.     Left Sinus: Maxillary sinus tenderness and frontal sinus tenderness present.     Mouth/Throat:     Mouth: Mucous membranes are moist.  Eyes:     Conjunctiva/sclera: Conjunctivae normal.  Cardiovascular:     Rate and Rhythm: Normal rate and regular rhythm.     Heart sounds: Normal heart sounds. No murmur.  Pulmonary:     Effort: Pulmonary effort is normal. No respiratory distress.     Breath sounds: Normal breath sounds. No stridor. No wheezing, rhonchi or rales.  Abdominal:     General: Bowel sounds are normal. There is no distension.     Palpations: Abdomen is soft. There is no mass.     Tenderness: There is no abdominal tenderness. There is no guarding.  Musculoskeletal:     Cervical back: Neck supple.  Skin:    General: Skin is warm and dry.  Neurological:     General: No focal deficit present.     Mental Status: She is alert and oriented to person, place, and time.  Psychiatric:        Mood and Affect: Mood normal.        Behavior: Behavior normal.      UC Treatments / Results  Labs (all labs ordered are listed, but only abnormal results are displayed) Labs Reviewed - No data to display  EKG   Radiology No results found.  Procedures Procedures (including critical care time)  Medications Ordered in UC Medications - No data to display  Initial Impression / Assessment and Plan / UC Course  I have reviewed the triage vital signs and the nursing notes.  Pertinent labs & imaging results that were available during my care of the patient were reviewed by me and considered in my medical  decision making (see chart for details).     Acute sinusitis with frontal and maxillary sinus tenderness upon palpation today.  Sent in doxycycline 100 mg twice daily x10 days.  Instructed to use normal saline nose spray over Flonase, to help decrease nasal dryness.  Instructed to follow-up with primary care if she is not feeling better by Monday. Final Clinical Impressions(s) / UC Diagnoses   Final diagnoses:  Acute sinusitis, recurrence not specified, unspecified location     Discharge Instructions     You have a sinus infection. I have sent in doxycyline twice a day for 10 days.   I have also sent in fluconazole for potential yeast infection.  Follow up with primary care if symptoms are not improving by Monday.    ED Prescriptions    Medication Sig Dispense Auth. Provider   doxycycline (VIBRAMYCIN) 100 MG capsule Take 1 capsule (100 mg total) by mouth 2 (two) times daily. 20 capsule Moshe Cipro, NP     I have reviewed the PDMP during this encounter.   Moshe Cipro, NP 04/09/19 1322

## 2019-04-11 NOTE — Assessment & Plan Note (Signed)
The patient states that her blood pressure has been ranging 130-160s/90-100s. The patient is currently taking losartan 50mg  qd and amlodipine 10mg  qd and spironolactone 25mg  qd. She states that she has not had any significant weight changes. Her last blood pressure visits are   BP Readings from Last 3 Encounters:  04/09/19 (!) 159/95  03/12/19 (!) 167/99  03/04/19 (!) 155/93   Assessment and Plan Continue current regimen and follow up in 2 weeks. Losartan was increased at last visit. Patient's last cr was 0.7 on 03/12/19. Will repeat bmp at next visit.

## 2019-04-11 NOTE — Telephone Encounter (Signed)
I will follow up thank you

## 2019-04-12 NOTE — Progress Notes (Signed)
Internal Medicine Clinic Attending  Case discussed with Dr. Chundi at the time of the visit.  We reviewed the resident's history and exam and pertinent patient test results.  I agree with the assessment, diagnosis, and plan of care documented in the resident's note. 

## 2019-04-12 NOTE — Assessment & Plan Note (Signed)
  The patient states that she has continued to have headaches at least few times per week. The headaches are pulsating in nature, lasting 1 day, 10/10 intensity, present on the back of her head/or frontal region. She states that after she was started on Maxalt she felt that the medication "it caused her breath to be taken away".   Assessment and plan  The patient has a long history of migraines since young age which has not been effectively treated. Will refer patient to neurology for further assistance with other therapies.   Recommended patient to take sumatriptan 50mg  prn along with Topiramate 25mg  qhs. Patient states that she is able to tolerate the sumatriptan better at this time.

## 2019-04-13 ENCOUNTER — Encounter: Payer: Self-pay | Admitting: Neurology

## 2019-04-19 NOTE — Addendum Note (Signed)
Addended by: Neomia Dear on: 04/19/2019 04:20 PM   Modules accepted: Orders

## 2019-05-25 ENCOUNTER — Ambulatory Visit (HOSPITAL_COMMUNITY)
Admission: EM | Admit: 2019-05-25 | Discharge: 2019-05-25 | Disposition: A | Discharge status: Home / Self Care | Payer: 59 | Attending: Physician Assistant | Admitting: Physician Assistant

## 2019-05-25 ENCOUNTER — Other Ambulatory Visit: Payer: Self-pay

## 2019-05-25 ENCOUNTER — Encounter (HOSPITAL_COMMUNITY): Payer: Self-pay

## 2019-05-25 DIAGNOSIS — J309 Allergic rhinitis, unspecified: Secondary | ICD-10-CM | POA: Insufficient documentation

## 2019-05-25 DIAGNOSIS — J302 Other seasonal allergic rhinitis: Secondary | ICD-10-CM | POA: Diagnosis present

## 2019-05-25 DIAGNOSIS — R0981 Nasal congestion: Secondary | ICD-10-CM | POA: Insufficient documentation

## 2019-05-25 DIAGNOSIS — R112 Nausea with vomiting, unspecified: Secondary | ICD-10-CM | POA: Diagnosis present

## 2019-05-25 DIAGNOSIS — Z20822 Contact with and (suspected) exposure to covid-19: Secondary | ICD-10-CM | POA: Insufficient documentation

## 2019-05-25 MED ORDER — CETIRIZINE HCL 10 MG PO TABS: 10.0000 mg | ORAL_TABLET | Freq: Every day | ORAL | 0 refills | Status: DC

## 2019-05-25 MED ORDER — ONDANSETRON HCL 4 MG PO TABS: 4.0000 mg | ORAL_TABLET | Freq: Three times a day (TID) | ORAL | 0 refills | Status: DC | PRN

## 2019-05-25 MED ORDER — FLUTICASONE PROPIONATE 50 MCG/ACT NA SUSP: 1.0000 | Freq: Every day | NASAL | 0 refills | Status: DC

## 2019-05-25 MED ORDER — SALINE SPRAY 0.65 % NA SOLN: 1.0000 | NASAL | 0 refills | Status: DC | PRN

## 2019-05-25 MED ORDER — AZELASTINE HCL 0.1 % NA SOLN: 1.0000 | Freq: Two times a day (BID) | NASAL | 0 refills | Status: DC

## 2019-05-25 NOTE — ED Triage Notes (Signed)
Pt c/o nasal congestion, runny nose, HA, throat irritation and mild nausea since Saturday. States she took Mucinex on Saturday but stopped taking 2/2 increasing her BP. Denies abdom pain, fever, chills, v/d.  Pt states she is flying to [place] on Thursday.

## 2019-05-25 NOTE — Discharge Instructions (Addendum)
Let's try this nasal spray regiment -Use the Astelin 1 spray daily -Flonase 1 spray daily -Nasal saline throughout the day.  Start Zyrtec daily  We have sent your Covid test, we'll call you if this is positive otherwise you'll find this result in your MyChart.

## 2019-05-26 LAB — SARS CORONAVIRUS 2 (TAT 6-24 HRS): SARS Coronavirus 2: NEGATIVE

## 2019-05-26 NOTE — ED Provider Notes (Signed)
[facility]    CSN: [id number] Arrival date & time: 05/25/19  1702      History   Chief Complaint Chief Complaint  Patient presents with  . Nasal Congestion    HPI Victoria Booth is a 39 y.o. female.   Patient presents here urgent care for evaluation of 3-day history of nasal congestion, frontal headache and some mild nausea.  She reports symptoms primarily started 2 to 3 days ago.  She has had sinus pressure and congestion.  She has had a mild runny nose with clear nasal discharge.  She has a mild cough primarily when feeling that there is a tickle in her throat.  Otherwise has not had a cough.  No shortness of breath.  She had a few episodes of nausea after taking Claritin yesterday, however she has not vomited tolerated fluids.  She denies having much of an appetite.  Frontal headache has been present since congestion slightly worsened.  Denies fever or chills.  Denies diarrhea or abdominal pain.  She reports history of seasonal allergies.  She reports having similar but worse symptoms in the February 2021 she received antibiotic therapy for sinusitis.  She reports symptoms did not significantly improve with antibiotic.  She reports gradually symptoms improved over 2 weeks.  She reports a little congestion and runny nose ever since that time however these acutely worsened over the last 2 to 3 days.  She does report sneezing and itchy watery eyes over the last few weeks.  Claritin has not helped much  She reports she is flying to [place] on Thursday.  She reports her skin positive in December and does require Covid testing currently.     Past Medical History:  Diagnosis Date  . Anemia   . Asthma   . Hypertension   . LUQ abdominal pain 10/16/2017  . Microcytic anemia 12/01/2014  . Obesity     Patient Active Problem List   Diagnosis Date Noted  . Anxiety 03/04/2019  . Hypertensive urgency 12/14/2018  . Vitamin D deficiency 08/04/2018  . Nephrolithiasis  07/14/2018  . Functional dyspepsia 02/20/2018  . IBS (irritable colon syndrome) 02/20/2018  . Iron deficiency anemia 08/18/2017  . Acute pain of left shoulder 08/18/2017  . Insomnia 07/29/2017  . Left lower quadrant abdominal pain of unknown etiology 07/13/2017  . OSA (obstructive sleep apnea) 06/05/2017  . Major depressive disorder 03/21/2017  . Migraine 10/24/2016  . Hematuria 09/24/2016  . Prediabetes 08/21/2016  . Allergic rhinitis 06/18/2016  . Left flank pain 06/05/2016  . PCOS (polycystic ovarian syndrome) 04/05/2016  . Essential hypertension, benign 12/01/2014  . Asthma, chronic 12/01/2014  . Gastroesophageal reflux disease 12/01/2014  . Preventative health care 12/01/2014    Past Surgical History:  Procedure Laterality Date  . EXTRACORPOREAL SHOCK WAVE LITHOTRIPSY Left 07/16/2018   Procedure: EXTRACORPOREAL SHOCK WAVE LITHOTRIPSY (ESWL);  Surgeon: Vanna Scotland, MD;  Location: [facility];  Service: Urology;  Laterality: Left;  . TOENAIL EXCISION      OB History   No obstetric history on file.      Home Medications    Prior to Admission medications   Medication Sig Start Date End Date Taking? Authorizing Provider  amLODipine (NORVASC) 10 MG tablet TAKE 1 TABLET BY MOUTH EVERY DAY Patient taking differently: Take 10 mg by mouth at bedtime.  10/06/18  Yes Chundi, Vahini, MD  losartan (COZAAR) 50 MG tablet Take 1 tablet (50 mg total) by mouth daily. 02/16/19  Yes Levora Dredge, MD  acetaminophen (TYLENOL) 500 MG tablet Take 1,000 mg by mouth every 6 (six) hours as needed for headache.    [provider]  albuterol (ACCUNEB) 1.25 MG/3ML nebulizer solution Take 3 mLs (1.25 mg total) by nebulization every 6 (six) hours as needed for wheezing. 02/16/19   Helberg, Jill Alexanders, MD  albuterol (VENTOLIN HFA) 108 (90 Base) MCG/ACT inhaler Inhale 2 puffs into the lungs every 6 (six) hours as needed for wheezing or shortness of breath. 02/16/19   Levora Dredge, MD   Aspirin-Salicylamide-Caffeine (BC HEADACHE POWDER PO) Take 2 packets by mouth daily as needed (severe headache).    [provider]  azelastine (ASTELIN) 0.1 % nasal spray Place 1 spray into both nostrils 2 (two) times daily. Use in each nostril as directed 05/25/19   Mykell Rawl, Victoria Speak, PA-C  cetirizine (ZYRTEC ALLERGY) 10 MG tablet Take 1 tablet (10 mg total) by mouth daily. 05/25/19 06/24/19  Lem Peary, Victoria Speak, PA-C  Cyanocobalamin (VITAMIN B-12 PO) Take 1 tablet by mouth daily.    [provider]  doxycycline (VIBRAMYCIN) 100 MG capsule Take 1 capsule (100 mg total) by mouth 2 (two) times daily. 04/09/19   Moshe Cipro, NP  fluticasone (FLONASE) 50 MCG/ACT nasal spray Place 1 spray into both nostrils daily for 14 days. 05/25/19 06/08/19  Taylorann Tkach, Victoria Speak, PA-C  ondansetron (ZOFRAN) 4 MG tablet Take 1 tablet (4 mg total) by mouth every 8 (eight) hours as needed for nausea or vomiting. 05/25/19   Sharhonda Atwood, Victoria Speak, PA-C  pantoprazole (PROTONIX) 40 MG tablet TAKE 1 TABLET BY MOUTH TWICE A DAY Patient taking differently: Take 40 mg by mouth 2 (two) times daily.  10/06/18   Lorenso Courier, MD  predniSONE (DELTASONE) 10 MG tablet  03/04/19   [provider]  sertraline (ZOLOFT) 50 MG tablet Take 50 mg by mouth daily. 12/27/18   [provider]  simethicone (GAS-X) 80 MG chewable tablet Chew 1 tablet (80 mg total) by mouth every 6 (six) hours as needed for flatulence. 03/04/19   Dellia Cloud, MD  sodium chloride (OCEAN) 0.65 % SOLN nasal spray Place 1 spray into both nostrils as needed for congestion. 05/25/19   Inika Bellanger, Victoria Speak, PA-C  spironolactone (ALDACTONE) 25 MG tablet TAKE 1 TABLET (25 MG TOTAL) BY MOUTH DAILY FOR 90 DOSES. Patient taking differently: Take 25 mg by mouth every morning.  10/05/18 04/09/19  Lorenso Courier, MD  SUMAtriptan (IMITREX) 50 MG tablet Take 1 tablet (50 mg total) by mouth once as needed for migraine. May repeat in 2 hours if headache persists or recurs.  Patient taking differently: Take 50 mg by mouth See admin instructions. Take one tablet (50 mg) by mouth once as needed for migraine headache, may repeat in 2 hours if headache persists or recurs. 09/22/18 04/09/19  Lorenso Courier, MD  topiramate (TOPAMAX) 25 MG tablet Take 1 tablet (25 mg total) by mouth at bedtime. 12/18/18 12/18/19  Burna Cash, MD    Family History Family History  Problem Relation Age of Onset  . Diabetes Mother   . Hypertension Mother   . Heart attack Mother        2016- 52 yrs  . Breast cancer Maternal Grandmother   . Hypertension Father   . Colon cancer Neg Hx   . Stomach cancer Neg Hx   . Pancreatic cancer Neg Hx     Social History Social History   Tobacco Use  . Smoking status: Former Smoker    Packs/day: 0.10  Types: Cigarettes    Quit date: 04/06/2015    Years since quitting: 4.1  . Smokeless tobacco: Never Used  Substance Use Topics  . Alcohol use: No    Alcohol/week: 0.0 standard drinks  . Drug use: No     Allergies   Penicillins, Lisinopril, Shellfish allergy, Sulfa antibiotics, Iodine, and Keflex [cephalexin]   Review of Systems Review of Systems  See HPI Physical Exam Triage Vital Signs ED Triage Vitals  Enc Vitals Group     BP 05/25/19 1807 (!) 164/110     Pulse Rate 05/25/19 1807 82     Resp 05/25/19 1807 18     Temp 05/25/19 1807 98.9 F (37.2 C)     Temp Source 05/25/19 1807 Oral     SpO2 05/25/19 1807 100 %     Weight --      Height --      Head Circumference --      Peak Flow --      Pain Score 05/25/19 1805 8     Pain Loc --      Pain Edu? --      Excl. in GC? --    No data found.  Updated Vital Signs BP (!) 164/110 (BP Location: Left Wrist)   Pulse 82   Temp 98.9 F (37.2 C) (Oral)   Resp 18   LMP 05/19/2019   SpO2 100%   Visual Acuity Right Eye Distance:   Left Eye Distance:   Bilateral Distance:    Right Eye Near:   Left Eye Near:    Bilateral Near:     Physical Exam Vitals and  nursing note reviewed.  Constitutional:      General: She is not in acute distress.    Appearance: She is well-developed. She is not ill-appearing.  HENT:     Head: Normocephalic and atraumatic.     Right Ear: Tympanic membrane normal.     Left Ear: Tympanic membrane normal.     Nose:     Comments: Turbinates erythematous mildly swollen.  Clear nasal discharge    Mouth/Throat:     Mouth: Mucous membranes are moist.     Comments: Mild postnasal drip present.  No exudates or erythema Eyes:     Extraocular Movements: Extraocular movements intact.     Conjunctiva/sclera: Conjunctivae normal.     Pupils: Pupils are equal, round, and reactive to light.  Cardiovascular:     Rate and Rhythm: Normal rate and regular rhythm.     Heart sounds: No murmur.  Pulmonary:     Effort: Pulmonary effort is normal. No respiratory distress.     Breath sounds: Normal breath sounds.  Abdominal:     Palpations: Abdomen is soft.     Tenderness: There is no abdominal tenderness.  Musculoskeletal:     Cervical back: Neck supple.     Left lower leg: No edema.  Skin:    General: Skin is warm and dry.  Neurological:     Mental Status: She is alert.      UC Treatments / Results  Labs (all labs ordered are listed, but only abnormal results are displayed) Labs Reviewed  SARS CORONAVIRUS 2 (TAT 6-24 HRS)    EKG   Radiology No results found.  Procedures Procedures (including critical care time)  Medications Ordered in UC Medications - No data to display  Initial Impression / Assessment and Plan / UC Course  I have reviewed the triage vital signs and the nursing notes.  Pertinent labs & imaging results that were available during my care of the patient were reviewed by me and considered in my medical decision making (see chart for details).     #Nasal congestion #Seasonal allergies #Allergic rhinitis #Nausea She is a 39 year old female presenting with with acute nasal signs and symptoms.   Likely this is allergy related.  Given she had no immediate improvement with previous anabiotic therapy and gradual improvement over time with return of symptoms later, I do not believe this is a treatment failure of previous sinusitis.  Given history of allergies and other allergic symptoms presenting with itchy watery eyes and sneezing, allergies most likely diagnosis.  Will recommend patient switch to Zyrtec, start Flonase and Astelin nasal sprays.  We will send Covid PCR for travel and she is outside of 90-days since last Covid positive.  Patient verbalized understanding plan and agrees.  Final Clinical Impressions(s) / UC Diagnoses   Final diagnoses:  Seasonal allergies  Nasal congestion  Allergic rhinitis, unspecified seasonality, unspecified trigger  Encounter for laboratory testing for COVID-19 virus  Non-intractable vomiting with nausea, unspecified vomiting type     Discharge Instructions     Let's try this nasal spray regiment -Use the Astelin 1 spray daily -Flonase 1 spray daily -Nasal saline throughout the day.  Start Zyrtec daily  We have sent your Covid test, we'll call you if this is positive otherwise you'll find this result in your MyChart.      ED Prescriptions    Medication Sig Dispense Auth. Provider   cetirizine (ZYRTEC ALLERGY) 10 MG tablet Take 1 tablet (10 mg total) by mouth daily. 30 tablet Pavneet Markwood, Victoria Beards, PA-C   fluticasone (FLONASE) 50 MCG/ACT nasal spray Place 1 spray into both nostrils daily for 14 days. 1 g Keiara Sneeringer, Victoria Beards, PA-C   azelastine (ASTELIN) 0.1 % nasal spray Place 1 spray into both nostrils 2 (two) times daily. Use in each nostril as directed 30 mL Kailey Esquilin, Victoria Beards, PA-C   sodium chloride (OCEAN) 0.65 % SOLN nasal spray Place 1 spray into both nostrils as needed for congestion. 44 mL Jebidiah Baggerly, Victoria Beards, PA-C   ondansetron (ZOFRAN) 4 MG tablet Take 1 tablet (4 mg total) by mouth every 8 (eight) hours as needed for nausea or vomiting. 4 tablet Makail Watling,  Victoria Beards, PA-C     PDMP not reviewed this encounter.   Purnell Shoemaker, PA-C 05/26/19 [phone]

## 2019-06-01 NOTE — Progress Notes (Signed)
NEUROLOGY CONSULTATION NOTE  Victoria Booth MRN: [id number] DOB: 09/16/1980  Referring provider: Reymundo Poll, MD Primary care provider: Lorenso Courier, MD  Reason for consult:  migraines  HISTORY OF PRESENT ILLNESS: Victoria Booth is a 39 year old left-handed female with HTN, asthma, microcytic anemia and PCOS who presents for migraines.  History supplemented by internal medicine notes.  Onset:  Off and on for several years occurring every other month.  Started to get progressively worse one year ago. Location:  Often preceded bu spasms in upper back and into shoulders.  Start in back of head bilaterally and radiate to the temples and front Quality:  pulsating Intensity:  10/10.  She denies new headache, thunderclap headache Aura:  none Premonitory Phase:  none Postdrome:  none Associated symptoms:  Nausea, vomiting, photophobia, phonophobia.  Sometimes lightheadedness and blurred vision.  She denies associated unilateral numbness or weakness. Duration:  2 to 3 days Frequency:  Twice a week (total 20 days a month) Triggers:  Unknown Relieving factors:  nothing Activity:  aggravates She has had a persistent headache for past week and had to cancel trip to [place]  She hasn't been to work this week (works at home on [employer]im[employer] for a [employer]).  To evaluate headaches, she has had CT of head without contrast performed on 04/10/2016, 04/14/2018 and 12/15/2018, which were personally reviewed and were unremarkable.  She was taking BC powder almost daily until December and has since been taking sumatriptan 3 to 4 days a week. Current NSAIDS:  Contraindicated due to elevated blood pressure Current analgesics:  acetaminophen Current triptans:  Sumatriptan 50mg  Current ergotamine:  none Current anti-emetic:  Zofran 4mg  Current muscle relaxants:  none Current anti-anxiolytic:  none Current sleep aide:  none Current Antihypertensive medications:   Amlodipine-losartan; spironolactone Current Antidepressant medications:  none Current Anticonvulsant medications:  topiramate 25mg  at bedtime Current anti-CGRP:  none Current Vitamins/Herbal/Supplements:  B12 Current Antihistamines/Decongestants:  Zyrtec; Flonase Other therapy:  none Hormone/birth control:  none She had a recent eye exam in February, which was fine.    Past NSAIDS/steroid:  Prednisone taper; naproxen; ibuprofen Past analgesics:  Acetaminophen; BC powder (effective) Past abortive triptans:  Maxalt (caused shortness of breath) Past abortive ergotamine:  none Past muscle relaxants:  none Past anti-emetic:  Reglan 10mg ; promethazine 25mg  Past antihypertensive medications:  Propranolol; metoprolol; lisinopril; Coreg; HCTZ Past antidepressant medications:  Sertraline 50mg  Past anticonvulsant medications:  none Past anti-CGRP:  none Past vitamins/Herbal/Supplements:  none Past antihistamines/decongestants:  none Other past therapies:  none  Caffeine:  No coffee or cola Diet:  Juice.  Rarely ginger ale.  6-7 bottles of water daily.  She may skip meals. Exercise:  Walks daily Depression:  no; Anxiety:  sometimes Other pain:  no Sleep:  Poor.  Difficult to fall asleep and then trouble staying asleep Family history of headache:  Unknown.  03/12/2019 LABS:  CBC with WBC 9.7, HGB 10.9, HCT 36.5, PLT 194, MCV 74; BMP with Na 137, K 3.6, Cl 108, CO2 20, Ca 8.8, glucose 130, BUN 10, Cr 0.71, GFR >60.  History of kidney stones s/p lithotripsy   PAST MEDICAL HISTORY: Past Medical History:  Diagnosis Date  . Anemia   . Asthma   . Hypertension   . LUQ abdominal pain 10/16/2017  . Microcytic anemia 12/01/2014  . Obesity     PAST SURGICAL HISTORY: Past Surgical History:  Procedure Laterality Date  . EXTRACORPOREAL SHOCK WAVE LITHOTRIPSY Left 07/16/2018   Procedure: EXTRACORPOREAL SHOCK  WAVE LITHOTRIPSY (ESWL);  Surgeon: Vanna Scotland, MD;  Location: [facility];  Service:  Urology;  Laterality: Left;  . TOENAIL EXCISION      MEDICATIONS: Current Outpatient Medications on File Prior to Visit  Medication Sig Dispense Refill  . acetaminophen (TYLENOL) 500 MG tablet Take 1,000 mg by mouth every 6 (six) hours as needed for headache.    . albuterol (ACCUNEB) 1.25 MG/3ML nebulizer solution Take 3 mLs (1.25 mg total) by nebulization every 6 (six) hours as needed for wheezing. 75 mL 12  . albuterol (VENTOLIN HFA) 108 (90 Base) MCG/ACT inhaler Inhale 2 puffs into the lungs every 6 (six) hours as needed for wheezing or shortness of breath. 6.7 g 11  . amLODipine (NORVASC) 10 MG tablet TAKE 1 TABLET BY MOUTH EVERY DAY (Patient taking differently: Take 10 mg by mouth at bedtime. ) 90 tablet 0  . Aspirin-Salicylamide-Caffeine (BC HEADACHE POWDER PO) Take 2 packets by mouth daily as needed (severe headache).    Marland Kitchen azelastine (ASTELIN) 0.1 % nasal spray Place 1 spray into both nostrils 2 (two) times daily. Use in each nostril as directed 30 mL 0  . cetirizine (ZYRTEC ALLERGY) 10 MG tablet Take 1 tablet (10 mg total) by mouth daily. 30 tablet 0  . Cyanocobalamin (VITAMIN B-12 PO) Take 1 tablet by mouth daily.    Marland Kitchen doxycycline (VIBRAMYCIN) 100 MG capsule Take 1 capsule (100 mg total) by mouth 2 (two) times daily. 20 capsule 0  . fluticasone (FLONASE) 50 MCG/ACT nasal spray Place 1 spray into both nostrils daily for 14 days. 1 g 0  . losartan (COZAAR) 50 MG tablet Take 1 tablet (50 mg total) by mouth daily. 30 tablet 0  . ondansetron (ZOFRAN) 4 MG tablet Take 1 tablet (4 mg total) by mouth every 8 (eight) hours as needed for nausea or vomiting. 4 tablet 0  . pantoprazole (PROTONIX) 40 MG tablet TAKE 1 TABLET BY MOUTH TWICE A DAY (Patient taking differently: Take 40 mg by mouth 2 (two) times daily. ) 180 tablet 0  . predniSONE (DELTASONE) 10 MG tablet     . sertraline (ZOLOFT) 50 MG tablet Take 50 mg by mouth daily.    . simethicone (GAS-X) 80 MG chewable tablet Chew 1 tablet (80 mg  total) by mouth every 6 (six) hours as needed for flatulence. 30 tablet 0  . sodium chloride (OCEAN) 0.65 % SOLN nasal spray Place 1 spray into both nostrils as needed for congestion. 44 mL 0  . spironolactone (ALDACTONE) 25 MG tablet TAKE 1 TABLET (25 MG TOTAL) BY MOUTH DAILY FOR 90 DOSES. (Patient taking differently: Take 25 mg by mouth every morning. ) 90 tablet 0  . SUMAtriptan (IMITREX) 50 MG tablet Take 1 tablet (50 mg total) by mouth once as needed for migraine. May repeat in 2 hours if headache persists or recurs. (Patient taking differently: Take 50 mg by mouth See admin instructions. Take one tablet (50 mg) by mouth once as needed for migraine headache, may repeat in 2 hours if headache persists or recurs.) 30 tablet 0  . topiramate (TOPAMAX) 25 MG tablet Take 1 tablet (25 mg total) by mouth at bedtime. 30 tablet 2   No current facility-administered medications on file prior to visit.    ALLERGIES: Allergies  Allergen Reactions  . Penicillins Shortness Of Breath, Rash and Other (See Comments)    Has patient had a PCN reaction causing immediate rash, facial/tongue/throat swelling, SOB or lightheadedness with hypotension:yes Has patient had a  PCN reaction causing severe rash involving mucus membranes or skin necrosis: no Has patient had a PCN reaction that required hospitalization : no Has patient had a PCN reaction occurring within the last 10 years:no  If all of the above answers are "NO", then may proceed with Cephalosporin use.   Marland Kitchen Lisinopril Cough  . Shellfish Allergy Swelling  . Sulfa Antibiotics Hives  . Iodine Rash  . Keflex [Cephalexin] Itching and Other (See Comments)    Itching hands and legs    FAMILY HISTORY: Family History  Problem Relation Age of Onset  . Diabetes Mother   . Hypertension Mother   . Heart attack Mother        2016- 52 yrs  . Breast cancer Maternal Grandmother   . Hypertension Father   . Colon cancer Neg Hx   . Stomach cancer Neg Hx   .  Pancreatic cancer Neg Hx    SOCIAL HISTORY: Social History   Socioeconomic History  . Marital status: Significant Other    Spouse name: Not on file  . Number of children: Not on file  . Years of education: Not on file  . Highest education level: Not on file  Occupational History  . Not on file  Tobacco Use  . Smoking status: Former Smoker    Packs/day: 0.10    Types: Cigarettes    Quit date: 04/06/2015    Years since quitting: 4.1  . Smokeless tobacco: Never Used  Substance and Sexual Activity  . Alcohol use: No    Alcohol/week: 0.0 standard drinks  . Drug use: No  . Sexual activity: Not on file  Other Topics Concern  . Not on file  Social History Narrative  . Not on file   Social Determinants of Health   Financial Resource Strain:   . Difficulty of Paying Living Expenses:   Food Insecurity:   . Worried About Programme researcher, broadcasting/film/video in the Last Year:   . Barista in the Last Year:   Transportation Needs:   . Freight forwarder (Medical):   Marland Kitchen Lack of Transportation (Non-Medical):   Physical Activity:   . Days of Exercise per Week:   . Minutes of Exercise per Session:   Stress:   . Feeling of Stress :   Social Connections:   . Frequency of Communication with Friends and Family:   . Frequency of Social Gatherings with Friends and Family:   . Attends Religious Services:   . Active Member of Clubs or Organizations:   . Attends Banker Meetings:   Marland Kitchen Marital Status:   Intimate Partner Violence:   . Fear of Current or Ex-Partner:   . Emotionally Abused:   Marland Kitchen Physically Abused:   . Sexually Abused:     PHYSICAL EXAM: Blood pressure (!) 145/93, pulse 72, height 5\' 6"  (1.676 m), weight 296 lb 6.4 oz (134.4 kg), last menstrual period 05/19/2019, SpO2 94 %. General: No acute distress.  Patient appears well-groomed.   Head:  Normocephalic/atraumatic Eyes:  fundi examined but not visualized Neck: supple, no paraspinal tenderness, full range of  motion Back: No paraspinal tenderness Heart: regular rate and rhythm Lungs: Clear to auscultation bilaterally. Vascular: No carotid bruits. Neurological Exam: Mental status: alert and oriented to person, place, and time, recent and remote memory intact, fund of knowledge intact, attention and concentration intact, speech fluent and not dysarthric, language intact. Cranial nerves: CN I: not tested CN II: pupils equal, round and reactive to light,  visual fields intact CN III, IV, VI:  full range of motion, no nystagmus, no ptosis CN V: facial sensation intact CN VII: upper and lower face symmetric CN VIII: hearing intact CN IX, X: gag intact, uvula midline CN XI: sternocleidomastoid and trapezius muscles intact CN XII: tongue midline Bulk & Tone: normal, no fasciculations. Motor:  5/5 throughout  Sensation:  Pinprick and vibration sensation intact.  Deep Tendon Reflexes:  2+ throughout, toes downgoing.   Finger to nose testing:  Without dysmetria.   Heel to shin:  Without dysmetria.   Gait:  Normal station and stride. Romberg negative.  IMPRESSION: 1.  Chronic migraine without aura, with status migrainosus, not intractable   PLAN: 1. To break current intractable headache, prednisone taper 2.  For preventative management, increase topiramate to 50mg  at bedtime.  We can increase to 75mg  at bedtime in 4 weeks if needed. 3.  For abortive therapy, she will try Nurtec.  Stop sumatriptan.  Changed Zofran to dissolvable tablet. 4.  Limit use of pain relievers to no more than 2 days out of week to prevent risk of rebound or medication-overuse headache. 5.  Keep headache diary 6.  Exercise, hydration, caffeine cessation, sleep hygiene, monitor for and avoid triggers 7.  Consider:  magnesium citrate 400mg  daily, riboflavin 400mg  daily, and coenzyme Q10 100mg  three times daily 8. Always keep in mind that currently taking a hormone or birth control may be a possible trigger or aggravating  factor for migraine. 9. Follow up 4 months   Thank you for allowing me to take part in the care of this patient.  Metta Clines, DO  CC:  Velna Ochs, MD  [name], MD

## 2019-06-02 ENCOUNTER — Encounter: Payer: Self-pay | Admitting: Internal Medicine

## 2019-06-02 ENCOUNTER — Ambulatory Visit: Payer: 59 | Admitting: Internal Medicine

## 2019-06-02 DIAGNOSIS — Z881 Allergy status to other antibiotic agents status: Secondary | ICD-10-CM

## 2019-06-02 DIAGNOSIS — Z87891 Personal history of nicotine dependence: Secondary | ICD-10-CM

## 2019-06-02 DIAGNOSIS — Z880 Allergy status to penicillin: Secondary | ICD-10-CM

## 2019-06-02 DIAGNOSIS — Z79899 Other long term (current) drug therapy: Secondary | ICD-10-CM

## 2019-06-02 DIAGNOSIS — Z91048 Other nonmedicinal substance allergy status: Secondary | ICD-10-CM

## 2019-06-02 DIAGNOSIS — F419 Anxiety disorder, unspecified: Secondary | ICD-10-CM | POA: Diagnosis not present

## 2019-06-02 DIAGNOSIS — I10 Essential (primary) hypertension: Secondary | ICD-10-CM | POA: Diagnosis not present

## 2019-06-02 DIAGNOSIS — Z888 Allergy status to other drugs, medicaments and biological substances status: Secondary | ICD-10-CM

## 2019-06-02 DIAGNOSIS — Z91013 Allergy to seafood: Secondary | ICD-10-CM

## 2019-06-02 MED ORDER — AMLODIPINE-VALSARTAN-HCTZ 10-160-25 MG PO TABS: 10.0000 mg | ORAL_TABLET | Freq: Every day | ORAL | 1 refills | Status: DC

## 2019-06-02 MED ORDER — SPIRONOLACTONE 25 MG PO TABS: 25.0000 mg | ORAL_TABLET | Freq: Every morning | ORAL | 1 refills | Status: DC

## 2019-06-02 MED ORDER — SERTRALINE HCL 25 MG PO TABS: 25.0000 mg | ORAL_TABLET | Freq: Every day | ORAL | 0 refills | Status: DC

## 2019-06-02 NOTE — Assessment & Plan Note (Signed)
[name] has been on Zoloft for over a year and has not seen much benefit. She would like to taper off this drug and try CBT alone with Phineas Semen  -new referral placed to [facility] -instructed her to take Zoloft 25 mg for 2 weeks and then discontinue altogether.

## 2019-06-02 NOTE — Progress Notes (Signed)
Acute Office Visit  Subjective:    Patient ID: Victoria Booth, female    DOB: [birth year], 39 y.o.   MRN: [id number]  Chief Complaint  Patient presents with  . Hypertension  . Follow-up    HPI Patient is in today for follow-up on HTN. Please see problem based charting for further details.   Past Medical History:  Diagnosis Date  . Anemia   . Asthma   . Hypertension   . LUQ abdominal pain 10/16/2017  . Microcytic anemia 12/01/2014  . Obesity     Past Surgical History:  Procedure Laterality Date  . EXTRACORPOREAL SHOCK WAVE LITHOTRIPSY Left 07/16/2018   Procedure: EXTRACORPOREAL SHOCK WAVE LITHOTRIPSY (ESWL);  Surgeon: Hollice Espy, MD;  Location: [facility] ORS;  Service: Urology;  Laterality: Left;  . TOENAIL EXCISION      Family History  Problem Relation Age of Onset  . Diabetes Mother   . Hypertension Mother   . Heart attack Mother        2016- 53 yrs  . Breast cancer Maternal Grandmother   . Hypertension Father   . Colon cancer Neg Hx   . Stomach cancer Neg Hx   . Pancreatic cancer Neg Hx     Social History   Socioeconomic History  . Marital status: Significant Other    Spouse name: Not on file  . Number of children: Not on file  . Years of education: Not on file  . Highest education level: Not on file  Occupational History  . Not on file  Tobacco Use  . Smoking status: Former Smoker    Packs/day: 0.10    Types: Cigarettes    Quit date: 04/06/2015    Years since quitting: 4.1  . Smokeless tobacco: Never Used  Substance and Sexual Activity  . Alcohol use: No    Alcohol/week: 0.0 standard drinks  . Drug use: No  . Sexual activity: Not on file  Other Topics Concern  . Not on file  Social History Narrative  . Not on file   Social Determinants of Health   Financial Resource Strain:   . Difficulty of Paying Living Expenses:   Food Insecurity:   . Worried About Charity fundraiser in the Last Year:   . Arboriculturist in the Last Year:    Transportation Needs:   . Film/video editor (Medical):   Marland Kitchen Lack of Transportation (Non-Medical):   Physical Activity:   . Days of Exercise per Week:   . Minutes of Exercise per Session:   Stress:   . Feeling of Stress :   Social Connections:   . Frequency of Communication with Friends and Family:   . Frequency of Social Gatherings with Friends and Family:   . Attends Religious Services:   . Active Member of Clubs or Organizations:   . Attends Archivist Meetings:   Marland Kitchen Marital Status:   Intimate Partner Violence:   . Fear of Current or Ex-Partner:   . Emotionally Abused:   Marland Kitchen Physically Abused:   . Sexually Abused:     Outpatient Medications Prior to Visit  Medication Sig Dispense Refill  . acetaminophen (TYLENOL) 500 MG tablet Take 1,000 mg by mouth every 6 (six) hours as needed for headache.    . albuterol (ACCUNEB) 1.25 MG/3ML nebulizer solution Take 3 mLs (1.25 mg total) by nebulization every 6 (six) hours as needed for wheezing. 75 mL 12  . albuterol (VENTOLIN HFA) 108 (90 Base) MCG/ACT inhaler  Inhale 2 puffs into the lungs every 6 (six) hours as needed for wheezing or shortness of breath. 6.7 g 11  . Aspirin-Salicylamide-Caffeine (BC HEADACHE POWDER PO) Take 2 packets by mouth daily as needed (severe headache).    Marland Kitchen azelastine (ASTELIN) 0.1 % nasal spray Place 1 spray into both nostrils 2 (two) times daily. Use in each nostril as directed 30 mL 0  . cetirizine (ZYRTEC ALLERGY) 10 MG tablet Take 1 tablet (10 mg total) by mouth daily. 30 tablet 0  . Cyanocobalamin (VITAMIN B-12 PO) Take 1 tablet by mouth daily.    Marland Kitchen doxycycline (VIBRAMYCIN) 100 MG capsule Take 1 capsule (100 mg total) by mouth 2 (two) times daily. 20 capsule 0  . fluticasone (FLONASE) 50 MCG/ACT nasal spray Place 1 spray into both nostrils daily for 14 days. 1 g 0  . ondansetron (ZOFRAN) 4 MG tablet Take 1 tablet (4 mg total) by mouth every 8 (eight) hours as needed for nausea or vomiting. 4 tablet 0   . pantoprazole (PROTONIX) 40 MG tablet TAKE 1 TABLET BY MOUTH TWICE A DAY (Patient taking differently: Take 40 mg by mouth 2 (two) times daily. ) 180 tablet 0  . predniSONE (DELTASONE) 10 MG tablet     . simethicone (GAS-X) 80 MG chewable tablet Chew 1 tablet (80 mg total) by mouth every 6 (six) hours as needed for flatulence. 30 tablet 0  . sodium chloride (OCEAN) 0.65 % SOLN nasal spray Place 1 spray into both nostrils as needed for congestion. 44 mL 0  . SUMAtriptan (IMITREX) 50 MG tablet Take 1 tablet (50 mg total) by mouth once as needed for migraine. May repeat in 2 hours if headache persists or recurs. (Patient taking differently: Take 50 mg by mouth See admin instructions. Take one tablet (50 mg) by mouth once as needed for migraine headache, may repeat in 2 hours if headache persists or recurs.) 30 tablet 0  . topiramate (TOPAMAX) 25 MG tablet Take 1 tablet (25 mg total) by mouth at bedtime. 30 tablet 2  . amLODipine (NORVASC) 10 MG tablet TAKE 1 TABLET BY MOUTH EVERY DAY (Patient taking differently: Take 10 mg by mouth at bedtime. ) 90 tablet 0  . losartan (COZAAR) 50 MG tablet Take 1 tablet (50 mg total) by mouth daily. 30 tablet 0  . sertraline (ZOLOFT) 50 MG tablet Take 50 mg by mouth daily.    Marland Kitchen spironolactone (ALDACTONE) 25 MG tablet TAKE 1 TABLET (25 MG TOTAL) BY MOUTH DAILY FOR 90 DOSES. (Patient taking differently: Take 25 mg by mouth every morning. ) 90 tablet 0   No facility-administered medications prior to visit.    Allergies  Allergen Reactions  . Penicillins Shortness Of Breath, Rash and Other (See Comments)    Has patient had a PCN reaction causing immediate rash, facial/tongue/throat swelling, SOB or lightheadedness with hypotension:yes Has patient had a PCN reaction causing severe rash involving mucus membranes or skin necrosis: no Has patient had a PCN reaction that required hospitalization : no Has patient had a PCN reaction occurring within the last 10 years:no   If all of the above answers are "NO", then may proceed with Cephalosporin use.   Marland Kitchen Lisinopril Cough  . Shellfish Allergy Swelling  . Sulfa Antibiotics Hives  . Iodine Rash  . Keflex [Cephalexin] Itching and Other (See Comments)    Itching hands and legs    Review of Systems  Constitutional: Positive for fatigue. Negative for activity change, chills and fever.  Respiratory: Negative for shortness of breath.   Cardiovascular: Negative for chest pain, palpitations and leg swelling.  Gastrointestinal: Negative for abdominal pain and nausea.  Neurological: Positive for headaches. Negative for syncope, weakness and numbness.       Objective:    Physical Exam Constitutional:      General: She is not in acute distress.    Appearance: Normal appearance.  Eyes:     Conjunctiva/sclera: Conjunctivae normal.  Cardiovascular:     Rate and Rhythm: Normal rate and regular rhythm.  Pulmonary:     Effort: Pulmonary effort is normal.     Breath sounds: Normal breath sounds.  Musculoskeletal:     Right lower leg: No edema.     Left lower leg: No edema.  Neurological:     General: No focal deficit present.     Mental Status: She is alert and oriented to person, place, and time.     BP (!) 152/96 (Patient Position: Sitting, Cuff Size: Normal)   Pulse 68   Temp 98.4 F (36.9 C) (Oral)   Ht 5\' 6"  (1.676 m)   Wt 299 lb 1.6 oz (135.7 kg)   LMP 05/19/2019   SpO2 99%   BMI 48.28 kg/m  Wt Readings from Last 3 Encounters:  06/02/19 299 lb 1.6 oz (135.7 kg)  03/12/19 279 lb (126.6 kg)  03/04/19 291 lb 6.4 oz (132.2 kg)    Health Maintenance Due  Topic Date Due  . COVID-19 Vaccine (1) Never done  . PAP SMEAR-Modifier  Never done    There are no preventive care reminders to display for this patient.   Lab Results  Component Value Date   TSH 1.208 10/12/2017   Lab Results  Component Value Date   WBC 9.7 03/12/2019   HGB 10.9 (L) 03/12/2019   HCT 36.5 03/12/2019   MCV 74.0  (L) 03/12/2019   PLT 294 03/12/2019   Lab Results  Component Value Date   NA 137 03/12/2019   K 3.6 03/12/2019   CO2 20 (L) 03/12/2019   GLUCOSE 130 (H) 03/12/2019   BUN 10 03/12/2019   CREATININE 0.71 03/12/2019   BILITOT 0.8 07/12/2018   ALKPHOS 88 07/12/2018   AST 26 07/12/2018   ALT 19 07/12/2018   PROT 7.5 07/12/2018   ALBUMIN 3.9 07/12/2018   CALCIUM 8.8 (L) 03/12/2019   ANIONGAP 9 03/12/2019   Lab Results  Component Value Date   CHOL 140 12/01/2014   Lab Results  Component Value Date   HDL 37 (L) 12/01/2014   Lab Results  Component Value Date   LDLCALC 91 12/01/2014   Lab Results  Component Value Date   TRIG 60 12/01/2014   Lab Results  Component Value Date   CHOLHDL 3.8 12/01/2014   Lab Results  Component Value Date   HGBA1C 5.8 04/05/2016       Assessment & Plan:   Problem List Items Addressed This Visit      Cardiovascular and Mediastinum   Essential hypertension, benign (Chronic)    Ms. Pumphrey present's for follow-up on HTN. She has been compliant with current regimen which is Losartan 50 mg, Amlodipine 10 mg and Spironolactone 25 mg. However, her home readings remain elevated with readings of 180/110. She states her diastolic value never goes below 100. She is concerned about her uncontrolled blood pressure because her mom died of a heart attack at 27 after longstanding HTN.  She would like to minimize pill burden as much as possible.  Plan -BMP today - transition to combination pill of Amlodipine-Valsartan-HCTZ -continue Spironolactone 25 mg -f/u in 4 weeks for re-check       Relevant Medications   amLODIPine-Valsartan-HCTZ 10-160-25 MG TABS   spironolactone (ALDACTONE) 25 MG tablet   Other Relevant Orders   BMP8+Anion Gap   VAS US RENAL ARTERY DUPLEX     Other   Anxiety    Ms. Curd has been on Zoloft for over a year and has not seen much benefit. She would like to taper off this drug and try CBT alone with Phineas Semen  -new referral  placed to [facility] -instructed her to take Zoloft 25 mg for 2 weeks and then discontinue altogether.       Relevant Medications   sertraline (ZOLOFT) 25 MG tablet   Other Relevant Orders   Ambulatory referral to [facility]       Meds ordered this encounter  Medications  . amLODIPine-Valsartan-HCTZ 10-160-25 MG TABS    Sig: Take 10 mg by mouth daily.    Dispense:  30 tablet    Refill:  1  . sertraline (ZOLOFT) 25 MG tablet    Sig: Take 1 tablet (25 mg total) by mouth daily.    Dispense:  14 tablet    Refill:  0  . spironolactone (ALDACTONE) 25 MG tablet    Sig: Take 1 tablet (25 mg total) by mouth every morning for 90 doses.    Dispense:  30 tablet    Refill:  1     Carley D Bloomfield, DO

## 2019-06-02 NOTE — Progress Notes (Signed)
Internal Medicine Clinic Attending  Case discussed with Dr. Bloomfield at the time of the visit.  We reviewed the resident's history and exam and pertinent patient test results.  I agree with the assessment, diagnosis, and plan of care documented in the resident's note.  

## 2019-06-02 NOTE — Assessment & Plan Note (Signed)
Victoria Booth present's for follow-up on HTN. She has been compliant with current regimen which is Losartan 50 mg, Amlodipine 10 mg and Spironolactone 25 mg. However, her home readings remain elevated with readings of 180/110. She states her diastolic value never goes below 100. She is concerned about her uncontrolled blood pressure because her mom died of a heart attack at 25 after longstanding HTN.  She would like to minimize pill burden as much as possible.   Plan -BMP today - transition to combination pill of Amlodipine-Valsartan-HCTZ -continue Spironolactone 25 mg -f/u in 4 weeks for re-check

## 2019-06-02 NOTE — Patient Instructions (Addendum)
Ms. Brassfield, It was nice meeting you. I understand your blood pressure has been a challenging and stressful matter, but we will continue to work on finding the best regimen for you.   - I am sending in a triple combination medication for you to take like we discussed. It will have valsartan (similar to losartan), amlodipine, and HCTZ.  - STOP your amlodipine and losartan once you start taking this pill. - Continue taking the spironolactone as prescribed.  - It looks like we tried to get you in for a renal ultrasound to look at the blood flow of your kidneys to see if this is contributing to your blood pressure being difficult to control. I am reordering this, and you will be called about scheduling.    We also discussed your anxiety and desire to come off zoloft and just try counseling for now. I will place another referral to [facility]. I'm sending in a prescription for zoloft 25 mg which I'd like you to take for 2 weeks and then stop altogether.   We'll bring you back in another 4 weeks to see how these medications are doing. If you think about it, try to bring a log of your home blood pressure readings and your home cuff for comparison to ours.   Take care! Dr. Chesley Mires

## 2019-06-03 ENCOUNTER — Other Ambulatory Visit: Payer: Self-pay

## 2019-06-03 ENCOUNTER — Encounter: Payer: Self-pay | Admitting: Neurology

## 2019-06-03 ENCOUNTER — Ambulatory Visit: Payer: 59 | Admitting: Neurology

## 2019-06-03 VITALS — BP 145/93 | HR 72 | Ht 66.0 in | Wt 296.4 lb

## 2019-06-03 DIAGNOSIS — G43711 Chronic migraine without aura, intractable, with status migrainosus: Secondary | ICD-10-CM | POA: Diagnosis not present

## 2019-06-03 LAB — BMP8+ANION GAP
Anion Gap: 14 mmol/L (ref 10.0–18.0)
BUN/Creatinine Ratio: 12 (ref 9–23)
BUN: 7 mg/dL (ref 6–20)
CO2: 20 mmol/L (ref 20–29)
Calcium: 9.1 mg/dL (ref 8.7–10.2)
Chloride: 104 mmol/L (ref 96–106)
Creatinine, Ser: 0.6 mg/dL (ref 0.57–1.00)
GFR calc Af Amer: 135 mL/min/{1.73_m2} (ref >59)
GFR calc non Af Amer: 117 mL/min/{1.73_m2} (ref >59)
Glucose: 89 mg/dL (ref 65–99)
Potassium: 4.3 mmol/L (ref 3.5–5.2)
Sodium: 138 mmol/L (ref 134–144)

## 2019-06-03 MED ORDER — ONDANSETRON 4 MG PO TBDP: 4.0000 mg | ORAL_TABLET | Freq: Three times a day (TID) | ORAL | 3 refills | Status: DC | PRN

## 2019-06-03 MED ORDER — TOPIRAMATE 50 MG PO TABS: 50.0000 mg | ORAL_TABLET | Freq: Every day | ORAL | 3 refills | Status: DC

## 2019-06-03 MED ORDER — PREDNISONE 10 MG PO TABS: ORAL_TABLET | ORAL | 0 refills | Status: DC

## 2019-06-03 NOTE — Patient Instructions (Signed)
Migraine Recommendations: 1.  We will increase topiramate to 50mg  at bedtime.  If headaches not improved in 4 weeks, contact me with update and we can adjust dose if needed. 2.  Take prednisone taper as directed to break current intractable headache 3.  Stop sumatriptan.  Instead, take Nurtec earliest onset of migraine.  No more than 1 tablet in 24 hours.  If effective, then contact me and I will write a prescription 4.  Be aware of common food triggers such as processed sweets, processed foods with nitrites (such as deli meat, hot dogs, sausages), foods with MSG, alcohol (such as wine), chocolate, certain cheeses, certain fruits (dried fruits, bananas, pineapple), vinegar, diet soda. 4.  Avoid caffeine 5.  Routine exercise 6.  Proper sleep hygiene 7.  Stay adequately hydrated with water 8.  Keep a headache diary. 9.  Maintain proper stress management. 10.  Do not skip meals. 11.  Consider supplements:  Magnesium citrate 400mg  to 600mg  daily, riboflavin 400mg , Coenzyme Q 10 100mg  three times daily 12.  Follow up in 4 months

## 2019-06-08 ENCOUNTER — Ambulatory Visit: Payer: 59

## 2019-06-09 ENCOUNTER — Encounter: Payer: Self-pay | Admitting: Licensed Clinical Social Worker

## 2019-06-09 ENCOUNTER — Telehealth: Payer: Self-pay | Admitting: Licensed Clinical Social Worker

## 2019-06-09 NOTE — Telephone Encounter (Signed)
Patient was called to discuss the referral from her doctor. Pt did not answer, and the vm box was full. A letter was mailed to the patient today.

## 2019-06-11 ENCOUNTER — Ambulatory Visit (HOSPITAL_COMMUNITY): Admission: RE | Admit: 2019-06-11 | Payer: 59 | Source: Ambulatory Visit

## 2019-07-13 ENCOUNTER — Encounter: Payer: 59 | Admitting: Internal Medicine

## 2019-07-15 ENCOUNTER — Other Ambulatory Visit: Payer: Self-pay

## 2019-07-15 ENCOUNTER — Ambulatory Visit (HOSPITAL_COMMUNITY)
Admission: EM | Admit: 2019-07-15 | Discharge: 2019-07-15 | Disposition: A | Discharge status: Home / Self Care | Payer: No Typology Code available for payment source | Attending: Family Medicine | Admitting: Family Medicine

## 2019-07-15 ENCOUNTER — Encounter (HOSPITAL_COMMUNITY): Payer: Self-pay

## 2019-07-15 DIAGNOSIS — J0100 Acute maxillary sinusitis, unspecified: Secondary | ICD-10-CM | POA: Insufficient documentation

## 2019-07-15 DIAGNOSIS — Z87891 Personal history of nicotine dependence: Secondary | ICD-10-CM | POA: Diagnosis not present

## 2019-07-15 DIAGNOSIS — E669 Obesity, unspecified: Secondary | ICD-10-CM | POA: Diagnosis not present

## 2019-07-15 DIAGNOSIS — J029 Acute pharyngitis, unspecified: Secondary | ICD-10-CM | POA: Diagnosis present

## 2019-07-15 DIAGNOSIS — I10 Essential (primary) hypertension: Secondary | ICD-10-CM | POA: Diagnosis not present

## 2019-07-15 DIAGNOSIS — Z79899 Other long term (current) drug therapy: Secondary | ICD-10-CM | POA: Insufficient documentation

## 2019-07-15 DIAGNOSIS — Z882 Allergy status to sulfonamides status: Secondary | ICD-10-CM | POA: Insufficient documentation

## 2019-07-15 DIAGNOSIS — R0982 Postnasal drip: Secondary | ICD-10-CM | POA: Diagnosis not present

## 2019-07-15 DIAGNOSIS — J3489 Other specified disorders of nose and nasal sinuses: Secondary | ICD-10-CM | POA: Diagnosis not present

## 2019-07-15 DIAGNOSIS — Z20822 Contact with and (suspected) exposure to covid-19: Secondary | ICD-10-CM | POA: Diagnosis not present

## 2019-07-15 DIAGNOSIS — Z8249 Family history of ischemic heart disease and other diseases of the circulatory system: Secondary | ICD-10-CM | POA: Diagnosis not present

## 2019-07-15 DIAGNOSIS — Z7952 Long term (current) use of systemic steroids: Secondary | ICD-10-CM | POA: Insufficient documentation

## 2019-07-15 DIAGNOSIS — Z888 Allergy status to other drugs, medicaments and biological substances status: Secondary | ICD-10-CM | POA: Diagnosis not present

## 2019-07-15 DIAGNOSIS — R0981 Nasal congestion: Secondary | ICD-10-CM | POA: Insufficient documentation

## 2019-07-15 DIAGNOSIS — Z881 Allergy status to other antibiotic agents status: Secondary | ICD-10-CM | POA: Diagnosis not present

## 2019-07-15 DIAGNOSIS — Z880 Allergy status to penicillin: Secondary | ICD-10-CM | POA: Diagnosis not present

## 2019-07-15 DIAGNOSIS — J45909 Unspecified asthma, uncomplicated: Secondary | ICD-10-CM | POA: Insufficient documentation

## 2019-07-15 LAB — SARS CORONAVIRUS 2 (TAT 6-24 HRS): SARS Coronavirus 2: NEGATIVE

## 2019-07-15 MED ORDER — AZITHROMYCIN 250 MG PO TABS: 250.0000 mg | ORAL_TABLET | Freq: Every day | ORAL | 0 refills | Status: DC

## 2019-07-15 MED ORDER — FLUCONAZOLE 150 MG PO TABS: ORAL_TABLET | ORAL | 0 refills | Status: DC

## 2019-07-15 NOTE — ED Provider Notes (Signed)
[facility]   [id number] 07/15/19 Arrival Time: 1223  [id number] THROAT  SUBJECTIVE: History from: patient.  Victoria Booth is a 39 y.o. female who presents with abrupt onset of nasal congestion, headache, sinus pain and cough for the last 6 days. Has been taking OTC allergy medications with no relief. Denies to sick exposure to Covid, strep, flu or mono, or precipitating event. There are no aggravating symptoms. Reports previous symptoms in the past.     Denies fever, chills, fatigue, ear pain, SOB, wheezing, chest pain, nausea, rash, changes in bowel or bladder habits.     ROS: As per HPI.  All other pertinent ROS negative.     Past Medical History:  Diagnosis Date   Anemia    Asthma    Hypertension    LUQ abdominal pain 10/16/2017   Microcytic anemia 12/01/2014   Obesity    Past Surgical History:  Procedure Laterality Date   EXTRACORPOREAL SHOCK WAVE LITHOTRIPSY Left 07/16/2018   Procedure: EXTRACORPOREAL SHOCK WAVE LITHOTRIPSY (ESWL);  Surgeon: Vanna Scotland, MD;  Location: [facility] ORS;  Service: Urology;  Laterality: Left;   TOENAIL EXCISION     Allergies  Allergen Reactions   Penicillins Shortness Of Breath, Rash and Other (See Comments)    Has patient had a PCN reaction causing immediate rash, facial/tongue/throat swelling, SOB or lightheadedness with hypotension:yes Has patient had a PCN reaction causing severe rash involving mucus membranes or skin necrosis: no Has patient had a PCN reaction that required hospitalization : no Has patient had a PCN reaction occurring within the last 10 years:no  If all of the above answers are "NO", then may proceed with Cephalosporin use.    Lisinopril Cough   Shellfish Allergy Swelling   Sulfa Antibiotics Hives   Iodine Rash   Keflex [Cephalexin] Itching and Other (See Comments)    Itching hands and legs   No current facility-administered medications on file prior to encounter.   Current Outpatient  Medications on File Prior to Encounter  Medication Sig Dispense Refill   acetaminophen (TYLENOL) 500 MG tablet Take 1,000 mg by mouth every 6 (six) hours as needed for headache.     albuterol (ACCUNEB) 1.25 MG/3ML nebulizer solution Take 3 mLs (1.25 mg total) by nebulization every 6 (six) hours as needed for wheezing. 75 mL 12   albuterol (VENTOLIN HFA) 108 (90 Base) MCG/ACT inhaler Inhale 2 puffs into the lungs every 6 (six) hours as needed for wheezing or shortness of breath. 6.7 g 11   amLODIPine-Valsartan-HCTZ 10-160-25 MG TABS Take 10 mg by mouth daily. 30 tablet 1   Aspirin-Salicylamide-Caffeine (BC HEADACHE POWDER PO) Take 2 packets by mouth daily as needed (severe headache).     azelastine (ASTELIN) 0.1 % nasal spray Place 1 spray into both nostrils 2 (two) times daily. Use in each nostril as directed (Patient not taking: Reported on 06/03/2019) 30 mL 0   cetirizine (ZYRTEC ALLERGY) 10 MG tablet Take 1 tablet (10 mg total) by mouth daily. 30 tablet 0   Cyanocobalamin (VITAMIN B-12 PO) Take 1 tablet by mouth daily.     fluticasone (FLONASE) 50 MCG/ACT nasal spray Place 1 spray into both nostrils daily for 14 days. 1 g 0   ondansetron (ZOFRAN ODT) 4 MG disintegrating tablet Take 1 tablet (4 mg total) by mouth every 8 (eight) hours as needed for nausea or vomiting. 20 tablet 3   ondansetron (ZOFRAN) 4 MG tablet Take 1 tablet (4 mg total) by mouth every 8 (eight) hours  as needed for nausea or vomiting. 4 tablet 0   pantoprazole (PROTONIX) 40 MG tablet TAKE 1 TABLET BY MOUTH TWICE A DAY (Patient taking differently: Take 40 mg by mouth 2 (two) times daily. ) 180 tablet 0   predniSONE (DELTASONE) 10 MG tablet Take 60mg  on day 1, then 50mg  on day 2, then 40mg  on day 3, then 30mg  on day 4, then 20mg  on day 5, then 10mg  on day 6, the STOP 21 tablet 0   sertraline (ZOLOFT) 25 MG tablet Take 1 tablet (25 mg total) by mouth daily. (Patient not taking: Reported on 06/03/2019) 14 tablet 0    SUMAtriptan (IMITREX) 50 MG tablet Take 1 tablet (50 mg total) by mouth once as needed for migraine. May repeat in 2 hours if headache persists or recurs. (Patient taking differently: Take 50 mg by mouth See admin instructions. Take one tablet (50 mg) by mouth once as needed for migraine headache, may repeat in 2 hours if headache persists or recurs.) 30 tablet 0   topiramate (TOPAMAX) 50 MG tablet Take 1 tablet (50 mg total) by mouth at bedtime. 30 tablet 3   Social History   Socioeconomic History   Marital status: Significant Other    Spouse name: Not on file   Number of children: Not on file   Years of education: Not on file   Highest education level: Not on file  Occupational History   Not on file  Tobacco Use   Smoking status: Former Smoker    Packs/day: 0.10    Types: Cigarettes    Quit date: 04/06/2015    Years since quitting: 4.2   Smokeless tobacco: Never Used  Substance and Sexual Activity   Alcohol use: No    Alcohol/week: 0.0 standard drinks   Drug use: No   Sexual activity: Not on file  Other Topics Concern   Not on file  Social History Narrative   Ieft handed   Live alone second floor apartment   Social Determinants of Health   Financial Resource Strain:    Difficulty of Paying Living Expenses:   Food Insecurity:    Worried About in the Last Year:    in the Last Year:   Transportation Needs:    (Medical):    Lack of Transportation (Non-Medical):   Physical Activity:    Days of Exercise per Week:    Minutes of Exercise per Session:   Stress:    Feeling of Stress :   Social Connections:    Frequency of Communication with Friends and Family:    Frequency of Social Gatherings with Friends and Family:    Attends Religious Services:    Active Member of Clubs or Organizations:    Attends :    Marital Status:   Intimate Partner Violence:    Fear  of Current or Ex-Partner:    Emotionally Abused:    Physically Abused:    Sexually Abused:    Family History  Problem Relation Age of Onset   Diabetes Mother    Hypertension Mother    Heart attack Mother        2016- 52 yrs   Breast cancer Maternal Grandmother    Hypertension Father    Colon cancer Neg Hx    Stomach cancer Neg Hx    Pancreatic cancer Neg Hx     OBJECTIVE:  Vitals:   07/15/19 1307  BP: (!) 156/119  Pulse: 81  Resp: 18  Temp: 98.4 F (36.9 C)  TempSrc: Oral  SpO2: 99%  Weight: 287 lb (130.2 kg)  Height: 5\' 6"  (1.676 m)     General appearance: alert; appears fatigued, but nontoxic, speaking in full sentences and managing own secretions HEENT: NCAT; Ears: EACs clear, TMs pearly gray with visible cone of light, without erythema; Eyes: PERRL, EOMI grossly; Nose: no obvious rhinorrhea; Throat: oropharynx clear, tonsils 1+ and mildly erythematous without white tonsillar exudates, uvula midline; sinus tenderness to maxillary sinuses Neck: supple without LAD Lungs: CTA bilaterally without adventitious breath sounds; cough absent Heart: regular rate and rhythm.  Radial pulses 2+ symmetrical bilaterally Skin: warm and dry Psychological: alert and cooperative; normal mood and affect  LABS: No results found for this or any previous visit (from the past 24 hour(s)).   ASSESSMENT & PLAN:  1. Acute non-recurrent maxillary sinusitis   2. Nasal congestion   3. Post-nasal drip   4. Tenderness over maxillary sinus     Meds ordered this encounter  Medications   azithromycin (ZITHROMAX) 250 MG tablet    Sig: Take 1 tablet (250 mg total) by mouth daily. Take first 2 tablets together, then 1 every day until finished.    Dispense:  6 tablet    Refill:  0    Order Specific Question:   Supervising Provider    Answer:   Chase Picket [[id number]]   fluconazole (DIFLUCAN) 150 MG tablet    Sig: Take one tablet at the onset of symptoms, if still having  symptoms in 3 days, take the second tablet.    Dispense:  2 tablet    Refill:  0    Order Specific Question:   Supervising Provider    Answer:   Chase Picket [[id number]]    Acute Sinusitis Push fluids and get rest Prescribed azithromycin Prescribed fluconazole as pt is prone to yeast infections Take as directed and to completion.  Drink warm or cool liquids, use throat lozenges, or popsicles to help alleviate symptoms Take OTC ibuprofen or tylenol as needed for pain Follow up with PCP if symptoms persist Return or go to ER if you have any new or worsening symptoms such as fever, chills, nausea, vomiting, worsening sore throat, cough, abdominal pain, chest pain, changes in bowel or bladder habits.   Reviewed expectations re: course of current medical issues. Questions answered. Outlined signs and symptoms indicating need for more acute intervention. Patient verbalized understanding. After Visit Summary given.           Faustino Congress, NP 07/15/19 1754

## 2019-07-15 NOTE — Discharge Instructions (Addendum)
You have a sinus infection.  I have prescribed azithromycin for you to take. Take as directed and to completion.  Follow up with this office or with primary care if you are not feeling better over the next 2 days.  Follow up with the ER for trouble swallowing, trouble breathing, other concerning symptoms.

## 2019-07-15 NOTE — ED Triage Notes (Signed)
Pt c/o HA, sore throat, eye itchienssx4 days. Pt denies nausea, photophobia. Pt denies dysphagia.

## 2019-08-12 ENCOUNTER — Other Ambulatory Visit: Payer: Self-pay | Admitting: Internal Medicine

## 2019-08-12 DIAGNOSIS — K219 Gastro-esophageal reflux disease without esophagitis: Secondary | ICD-10-CM

## 2019-10-08 NOTE — Progress Notes (Deleted)
NEUROLOGY FOLLOW UP OFFICE NOTE  Victoria Booth [id number]  HISTORY OF PRESENT ILLNESS: Victoria Booth is a 39 year old left-handed female with HTN, asthma, microcytic anemia and PCOS and history of kidney stones s/p lithotripsy who follows up for migraines.  UPDATE: Increased topiramate in April.  Had her try Nurtec.  *** Intensity:  *** Duration:  *** Frequency:  *** Frequency of abortive medication: *** Current NSAIDS:  Contraindicated due to elevated blood pressure Current analgesics:  acetaminophen Current triptans:  Sumatriptan 50mg  Current ergotamine:  none Current anti-emetic:  Zofran ODT 4mg  Current muscle relaxants:  none Current anti-anxiolytic:  none Current sleep aide:  none Current Antihypertensive medications:  Amlodipine-losartan; spironolactone Current Antidepressant medications:  none Current Anticonvulsant medications:  topiramate 50mg  at bedtime Current anti-CGRP:  Nurtec Current Vitamins/Herbal/Supplements:  B12 Current Antihistamines/Decongestants:  Zyrtec; Flonase Other therapy:  none Hormone/birth control:  none  Caffeine:  No coffee or cola Diet:  Juice.  Rarely ginger ale.  6-7 bottles of water daily.  She may skip meals. Exercise:  Walks daily Depression:  no; Anxiety:  sometimes Other pain:  no Sleep:  Poor.  Difficult to fall asleep and then trouble staying asleep  HISTORY: Onset:  Off and on for several years occurring every other month.  Started to get progressively worse one year ago. Location:  Often preceded bu spasms in upper back and into shoulders.  Start in back of head bilaterally and radiate to the temples and front Quality:  pulsating Initial Intensity:  10/10.  She denies new headache, thunderclap headache Aura:  none Premonitory Phase:  none Postdrome:  none Associated symptoms:  Nausea, vomiting, photophobia, phonophobia.  Sometimes lightheadedness and blurred vision.  She denies associated unilateral numbness or  weakness. Initial Duration:  2 to 3 days Initial Frequency:  Twice a week (total 20 days a month) Triggers:  Unknown Relieving factors:  nothing Activity:  aggravates She has had a persistent headache for past week and had to cancel trip to .  She hasn't been to work this week (works at home on for a ).  To evaluate headaches, she has had CT of head without contrast performed on 04/10/2016, 04/14/2018 and 12/15/2018, which were personally reviewed and were unremarkable.   She had a recent eye exam in February, which was fine.    Past NSAIDS/steroid:  Prednisone taper; naproxen; ibuprofen Past analgesics:  Acetaminophen; BC powder (effective) Past abortive triptans:  Maxalt (caused shortness of breath) Past abortive ergotamine:  none Past muscle relaxants:  none Past anti-emetic:  Reglan 10mg ; promethazine 25mg  Past antihypertensive medications:  Propranolol; metoprolol; lisinopril; Coreg; HCTZ Past antidepressant medications:  Sertraline 50mg  Past anticonvulsant medications:  none Past anti-CGRP:  none Past vitamins/Herbal/Supplements:  none Past antihistamines/decongestants:  none Other past therapies:  none   Family history of headache:  Unknown.   PAST MEDICAL HISTORY: Past Medical History:  Diagnosis Date  . Anemia   . Asthma   . Hypertension   . LUQ abdominal pain 10/16/2017  . Microcytic anemia 12/01/2014  . Obesity     MEDICATIONS: Current Outpatient Medications on File Prior to Visit  Medication Sig Dispense Refill  . acetaminophen (TYLENOL) 500 MG tablet Take 1,000 mg by mouth every 6 (six) hours as needed for headache.    . albuterol (ACCUNEB) 1.25 MG/3ML nebulizer solution Take 3 mLs (1.25 mg total) by nebulization every 6 (six) hours as needed for wheezing. 75 mL 12  . albuterol (VENTOLIN HFA) 108 (90  Base) MCG/ACT inhaler Inhale 2 puffs into the lungs every 6 (six) hours as needed for wheezing or shortness of breath. 6.7  g 11  . amLODIPine-Valsartan-HCTZ 10-160-25 MG TABS Take 10 mg by mouth daily. 30 tablet 1  . Aspirin-Salicylamide-Caffeine (BC HEADACHE POWDER PO) Take 2 packets by mouth daily as needed (severe headache).    Marland Kitchen azelastine (ASTELIN) 0.1 % nasal spray Place 1 spray into both nostrils 2 (two) times daily. Use in each nostril as directed (Patient not taking: Reported on 06/03/2019) 30 mL 0  . azithromycin (ZITHROMAX) 250 MG tablet Take 1 tablet (250 mg total) by mouth daily. Take first 2 tablets together, then 1 every day until finished. 6 tablet 0  . cetirizine (ZYRTEC ALLERGY) 10 MG tablet Take 1 tablet (10 mg total) by mouth daily. 30 tablet 0  . Cyanocobalamin (VITAMIN B-12 PO) Take 1 tablet by mouth daily.    . fluconazole (DIFLUCAN) 150 MG tablet Take one tablet at the onset of symptoms, if still having symptoms in 3 days, take the second tablet. 2 tablet 0  . fluticasone (FLONASE) 50 MCG/ACT nasal spray Place 1 spray into both nostrils daily for 14 days. 1 g 0  . ondansetron (ZOFRAN ODT) 4 MG disintegrating tablet Take 1 tablet (4 mg total) by mouth every 8 (eight) hours as needed for nausea or vomiting. 20 tablet 3  . ondansetron (ZOFRAN) 4 MG tablet Take 1 tablet (4 mg total) by mouth every 8 (eight) hours as needed for nausea or vomiting. 4 tablet 0  . pantoprazole (PROTONIX) 40 MG tablet TAKE 1 TABLET BY MOUTH TWICE A DAY (Patient taking differently: Take 40 mg by mouth 2 (two) times daily. ) 180 tablet 0  . predniSONE (DELTASONE) 10 MG tablet Take 60mg  on day 1, then 50mg  on day 2, then 40mg  on day 3, then 30mg  on day 4, then 20mg  on day 5, then 10mg  on day 6, the STOP 21 tablet 0  . sertraline (ZOLOFT) 25 MG tablet Take 1 tablet (25 mg total) by mouth daily. (Patient not taking: Reported on 06/03/2019) 14 tablet 0  . SUMAtriptan (IMITREX) 50 MG tablet Take 1 tablet (50 mg total) by mouth once as needed for migraine. May repeat in 2 hours if headache persists or recurs. (Patient taking  differently: Take 50 mg by mouth See admin instructions. Take one tablet (50 mg) by mouth once as needed for migraine headache, may repeat in 2 hours if headache persists or recurs.) 30 tablet 0  . topiramate (TOPAMAX) 50 MG tablet Take 1 tablet (50 mg total) by mouth at bedtime. 30 tablet 3   No current facility-administered medications on file prior to visit.    ALLERGIES: Allergies  Allergen Reactions  . Penicillins Shortness Of Breath, Rash and Other (See Comments)    Has patient had a PCN reaction causing immediate rash, facial/tongue/throat swelling, SOB or lightheadedness with hypotension:yes Has patient had a PCN reaction causing severe rash involving mucus membranes or skin necrosis: no Has patient had a PCN reaction that required hospitalization : no Has patient had a PCN reaction occurring within the last 10 years:no  If all of the above answers are "NO", then may proceed with Cephalosporin use.   Lisinopril Cough  . Shellfish Allergy Swelling  . Sulfa Antibiotics Hives  . Iodine Rash  . Keflex [Cephalexin] Itching and Other (See Comments)    Itching hands and legs    FAMILY HISTORY: Family History  Problem Relation Age of  Onset  . Diabetes Mother   . Hypertension Mother   . Heart attack Mother        2016- 52 yrs  . Breast cancer Maternal Grandmother   . Hypertension Father   . Colon cancer Neg Hx   . Stomach cancer Neg Hx   . Pancreatic cancer Neg Hx     SOCIAL HISTORY: Social History   Socioeconomic History  . Marital status: Significant Other    Spouse name: Not on file  . Number of children: Not on file  . Years of education: Not on file  . Highest education level: Not on file  Occupational History  . Not on file  Tobacco Use  . Smoking status: Former Smoker    Packs/day: 0.10    Types: Cigarettes    Quit date: 04/06/2015    Years since quitting: 4.5  . Smokeless tobacco: Never Used  Vaping Use  . Vaping Use: Never used  Substance and Sexual  Activity  . Alcohol use: No    Alcohol/week: 0.0 standard drinks  . Drug use: No  . Sexual activity: Not on file  Other Topics Concern  . Not on file  Social History Narrative   Ieft handed   Live alone second floor apartment   Social Determinants of Health   Financial Resource Strain:   . Difficulty of Paying Living Expenses: Not on file  Food Insecurity:   . Worried About Programme researcher, broadcasting/film/video in the Last Year: Not on file  . Ran Out of Food in the Last Year: Not on file  Transportation Needs:   . Lack of Transportation (Medical): Not on file  . Lack of Transportation (Non-Medical): Not on file  Physical Activity:   . Days of Exercise per Week: Not on file  . Minutes of Exercise per Session: Not on file  Stress:   . Feeling of Stress : Not on file  Social Connections:   . Frequency of Communication with Friends and Family: Not on file  . Frequency of Social Gatherings with Friends and Family: Not on file  . Attends Religious Services: Not on file  . Active Member of Clubs or Organizations: Not on file  . Attends Banker Meetings: Not on file  . Marital Status: Not on file  Intimate Partner Violence:   . Fear of Current or Ex-Partner: Not on file  . Emotionally Abused: Not on file  . Physically Abused: Not on file  . Sexually Abused: Not on file    PHYSICAL EXAM: *** General: No acute distress.  Patient appears well-groomed.   Head:  Normocephalic/atraumatic Eyes:  Fundi examined but not visualized Neck: supple, no paraspinal tenderness, full range of motion Heart:  Regular rate and rhythm Lungs:  Clear to auscultation bilaterally Back: No paraspinal tenderness Neurological Exam: alert and oriented to person, place, and time. Attention span and concentration intact, recent and remote memory intact, fund of knowledge intact.  Speech fluent and not dysarthric, language intact.  CN II-XII intact. Bulk and tone normal, muscle strength 5/5 throughout.   Sensation to light touch, temperature and vibration intact.  Deep tendon reflexes 2+ throughout, toes downgoing.  Finger to nose and heel to shin testing intact.  Gait normal, Romberg negative.  IMPRESSION: ***  PLAN: 1.  For preventative management, *** 2.  For abortive therapy, *** 3.  Limit use of pain relievers to no more than 2 days out of week to prevent risk of rebound or medication-overuse headache. 4.  Keep headache diary 5.  Exercise, hydration, caffeine cessation, sleep hygiene, monitor for and avoid triggers 6. Follow up ***   Shon Millet, DO  CC: ***

## 2019-10-11 ENCOUNTER — Ambulatory Visit: Payer: 59 | Admitting: Neurology

## 2020-01-04 ENCOUNTER — Other Ambulatory Visit: Payer: Self-pay

## 2020-01-04 ENCOUNTER — Encounter (HOSPITAL_COMMUNITY): Payer: Self-pay

## 2020-01-04 ENCOUNTER — Ambulatory Visit (HOSPITAL_COMMUNITY)
Admission: EM | Admit: 2020-01-04 | Discharge: 2020-01-04 | Disposition: A | Discharge status: Home / Self Care | Payer: BC Managed Care – PPO | Attending: Family Medicine | Admitting: Family Medicine

## 2020-01-04 DIAGNOSIS — R42 Dizziness and giddiness: Secondary | ICD-10-CM

## 2020-01-04 DIAGNOSIS — I10 Essential (primary) hypertension: Secondary | ICD-10-CM

## 2020-01-04 DIAGNOSIS — R002 Palpitations: Secondary | ICD-10-CM | POA: Diagnosis not present

## 2020-01-04 DIAGNOSIS — R519 Headache, unspecified: Secondary | ICD-10-CM

## 2020-01-04 DIAGNOSIS — R030 Elevated blood-pressure reading, without diagnosis of hypertension: Secondary | ICD-10-CM

## 2020-01-04 HISTORY — DX: Crohn's disease, unspecified, without complications: K50.90

## 2020-01-04 NOTE — Discharge Instructions (Signed)

## 2020-01-04 NOTE — ED Provider Notes (Signed)
[facility]   MRN: [id number] DOB: [birth year]  Subjective:   Victoria Booth is a 39 y.o. female presenting for acute onset today of dizziness and posterior neck and head pain.  She has a history of high blood pressure feels like her medication is working.  She also had some palpitations last night and felt it a little bit today as well.  Reports that in general her blood pressure is always in the 160s over 100 systolic.  Currently, denies any particular chest pain, shortness of breath, pain radiating into the back, neck, jaw or arms.  Denies any confusion, weakness, slurred speech.  She is concerned because her mother did have 2 strokes in her 37s.  Her aunt also died in her 78s from an MI.  No current facility-administered medications for this encounter.  Current Outpatient Medications:  .  acetaminophen (TYLENOL) 500 MG tablet, Take 1,000 mg by mouth every 6 (six) hours as needed for headache., Disp: , Rfl:  .  albuterol (ACCUNEB) 1.25 MG/3ML nebulizer solution, Take 3 mLs (1.25 mg total) by nebulization every 6 (six) hours as needed for wheezing., Disp: 75 mL, Rfl: 12 .  albuterol (VENTOLIN HFA) 108 (90 Base) MCG/ACT inhaler, Inhale 2 puffs into the lungs every 6 (six) hours as needed for wheezing or shortness of breath., Disp: 6.7 g, Rfl: 11 .  amLODIPine-Valsartan-HCTZ 10-160-25 MG TABS, Take 10 mg by mouth daily., Disp: 30 tablet, Rfl: 1 .  Aspirin-Salicylamide-Caffeine (BC HEADACHE POWDER PO), Take 2 packets by mouth daily as needed (severe headache)., Disp: , Rfl:  .  azelastine (ASTELIN) 0.1 % nasal spray, Place 1 spray into both nostrils 2 (two) times daily. Use in each nostril as directed (Patient not taking: Reported on 06/03/2019), Disp: 30 mL, Rfl: 0 .  azithromycin (ZITHROMAX) 250 MG tablet, Take 1 tablet (250 mg total) by mouth daily. Take first 2 tablets together, then 1 every day until finished., Disp: 6 tablet, Rfl: 0 .  cetirizine (ZYRTEC ALLERGY) 10  MG tablet, Take 1 tablet (10 mg total) by mouth daily., Disp: 30 tablet, Rfl: 0 .  Cyanocobalamin (VITAMIN B-12 PO), Take 1 tablet by mouth daily., Disp: , Rfl:  .  fluconazole (DIFLUCAN) 150 MG tablet, Take one tablet at the onset of symptoms, if still having symptoms in 3 days, take the second tablet., Disp: 2 tablet, Rfl: 0 .  fluticasone (FLONASE) 50 MCG/ACT nasal spray, Place 1 spray into both nostrils daily for 14 days., Disp: 1 g, Rfl: 0 .  ondansetron (ZOFRAN ODT) 4 MG disintegrating tablet, Take 1 tablet (4 mg total) by mouth every 8 (eight) hours as needed for nausea or vomiting., Disp: 20 tablet, Rfl: 3 .  ondansetron (ZOFRAN) 4 MG tablet, Take 1 tablet (4 mg total) by mouth every 8 (eight) hours as needed for nausea or vomiting., Disp: 4 tablet, Rfl: 0 .  pantoprazole (PROTONIX) 40 MG tablet, TAKE 1 TABLET BY MOUTH TWICE A DAY (Patient taking differently: Take 40 mg by mouth 2 (two) times daily. ), Disp: 180 tablet, Rfl: 0 .  predniSONE (DELTASONE) 10 MG tablet, Take 60mg  on day 1, then 50mg  on day 2, then 40mg  on day 3, then 30mg  on day 4, then 20mg  on day 5, then 10mg  on day 6, the STOP, Disp: 21 tablet, Rfl: 0 .  sertraline (ZOLOFT) 25 MG tablet, Take 1 tablet (25 mg total) by mouth daily. (Patient not taking: Reported on 06/03/2019), Disp: 14 tablet, Rfl: 0 .  SUMAtriptan (IMITREX) 50 MG tablet, Take 1 tablet (50 mg total) by mouth once as needed for migraine. May repeat in 2 hours if headache persists or recurs. (Patient taking differently: Take 50 mg by mouth See admin instructions. Take one tablet (50 mg) by mouth once as needed for migraine headache, may repeat in 2 hours if headache persists or recurs.), Disp: 30 tablet, Rfl: 0 .  topiramate (TOPAMAX) 50 MG tablet, Take 1 tablet (50 mg total) by mouth at bedtime., Disp: 30 tablet, Rfl: 3   Allergies  Allergen Reactions  . Penicillins Shortness Of Breath, Rash and Other (See Comments)    Has patient had a PCN reaction causing  immediate rash, facial/tongue/throat swelling, SOB or lightheadedness with hypotension:yes Has patient had a PCN reaction causing severe rash involving mucus membranes or skin necrosis: no Has patient had a PCN reaction that required hospitalization : no Has patient had a PCN reaction occurring within the last 10 years:no  If all of the above answers are "NO", then may proceed with Cephalosporin use.   Marland Kitchen Lisinopril Cough  . Shellfish Allergy Swelling  . Sulfa Antibiotics Hives  . Iodine Rash  . Keflex [Cephalexin] Itching and Other (See Comments)    Itching hands and legs    Past Medical History:  Diagnosis Date  . Anemia   . Asthma   . Hypertension   . LUQ abdominal pain 10/16/2017  . Microcytic anemia 12/01/2014  . Obesity      Past Surgical History:  Procedure Laterality Date  . EXTRACORPOREAL SHOCK WAVE LITHOTRIPSY Left 07/16/2018   Procedure: EXTRACORPOREAL SHOCK WAVE LITHOTRIPSY (ESWL);  Surgeon: Vanna Scotland, MD;  Location: [facility] ORS;  Service: Urology;  Laterality: Left;  . TOENAIL EXCISION      Family History  Problem Relation Age of Onset  . Diabetes Mother   . Hypertension Mother   . Heart attack Mother        2016- 52 yrs  . Breast cancer Maternal Grandmother   . Hypertension Father   . Colon cancer Neg Hx   . Stomach cancer Neg Hx   . Pancreatic cancer Neg Hx     Social History   Tobacco Use  . Smoking status: Former Smoker    Packs/day: 0.10    Types: Cigarettes    Quit date: 04/06/2015    Years since quitting: 4.7  . Smokeless tobacco: Never Used  Vaping Use  . Vaping Use: Never used  Substance Use Topics  . Alcohol use: No    Alcohol/week: 0.0 standard drinks  . Drug use: No    ROS   Objective:   Vitals: BP (!) 173/106 (BP Location: Right Wrist)   Pulse 83   Temp 98 F (36.7 C) (Oral)   Resp 20   SpO2 99%   BP Readings from Last 3 Encounters:  01/04/20 (!) 173/106  07/15/19 (!) 156/119  06/03/19 (!) 145/93   BP recheck  151/99.  Physical Exam Constitutional:      General: She is not in acute distress.    Appearance: Normal appearance. She is well-developed. She is obese. She is not ill-appearing, toxic-appearing or diaphoretic.  HENT:     Head: Normocephalic and atraumatic.     Right Ear: External ear normal.     Left Ear: External ear normal.     Nose: Nose normal.     Mouth/Throat:     Mouth: Mucous membranes are moist.  Eyes:     General: No scleral icterus.  Right eye: No discharge.        Left eye: No discharge.     Extraocular Movements: Extraocular movements intact.     Conjunctiva/sclera: Conjunctivae normal.     Pupils: Pupils are equal, round, and reactive to light.  Cardiovascular:     Rate and Rhythm: Normal rate and regular rhythm.     Pulses: Normal pulses.     Heart sounds: Normal heart sounds. No murmur heard.  No friction rub. No gallop.   Pulmonary:     Effort: Pulmonary effort is normal. No respiratory distress.     Breath sounds: Normal breath sounds. No stridor. No wheezing, rhonchi or rales.  Musculoskeletal:     Right lower leg: No edema.     Left lower leg: No edema.  Skin:    General: Skin is warm and dry.     Findings: No rash.  Neurological:     Mental Status: She is alert and oriented to person, place, and time.     Cranial Nerves: No cranial nerve deficit or facial asymmetry.     Motor: No weakness.     Coordination: Romberg sign negative. Coordination normal.     Gait: Gait normal.     Deep Tendon Reflexes: Reflexes normal.  Psychiatric:        Mood and Affect: Mood normal. Mood is not anxious or depressed.        Speech: Speech normal.        Behavior: Behavior normal. Behavior is not agitated.        Thought Content: Thought content normal.        Cognition and Memory: Cognition is not impaired. Memory is not impaired.        Judgment: Judgment normal.     ED ECG REPORT   Date: 01/04/2020  Rate: 77bpm  Rhythm: normal sinus rhythm  QRS Axis:  normal  Intervals: normal  ST/T Wave abnormalities: nonspecific T wave changes  Conduction Disutrbances:none  Narrative Interpretation: Sinus rhythm with nonspecific T wave inversion in lead III.  Very comparable to her previous EKG January 2021.  Old EKG Reviewed: unchanged  I have personally reviewed the EKG tracing and agree with the computerized printout as noted.   Assessment and Plan :   PDMP not reviewed this encounter.  1. Dizziness   2. Essential hypertension   3. Elevated blood pressure reading   4. Morbid obesity (HCC)   5. Palpitations   6. Generalized headache     Patient has normal neurologic, cardiovascular exam. Counseled patient that she does not have signs of an acute stroke.  Reassured her with her physical exam findings, normal EKG.  Had extensive discussion with patient about sources of dizziness and headache which in her case I believe is related to her uncontrolled hypertension.  Emphasized need for dietary modifications, healthy lifestyle.  Will avoid making any changes to her current regimen of her blood pressure medications.  Needs to follow-up with her PCP ASAP.  Strict ER precautions. Counseled patient on potential for adverse effects with medications prescribed today, patient verbalized understanding.    Wallis Bamberg, PA-C 01/15/20 0900

## 2020-01-04 NOTE — ED Triage Notes (Signed)
Pt reports that she awakened at 0200 last night 2/2 dizziness and "not feeling right", posterior HA and is concerned her BP may be elevated. Also reports she felt palpitations last night and intermittently today. Pt states her baseline BP is 160s/100s.  Denies CP, SOB, pain to back/jaw, slurred speech, extremity weakness, difficulty with mentation.  Smile symmetrical, grips equal/strong, shoulder shrug strong/symmetrical, negative arm drift. S1 S2 RRR.  Reports her mother had CVA x2 in her 24s, pt's aunt died from AMI at age 79. Ekg performed and results given to M. Marquita Palms, [name].

## 2020-02-16 ENCOUNTER — Ambulatory Visit (HOSPITAL_COMMUNITY)
Admission: RE | Admit: 2020-02-16 | Discharge: 2020-02-16 | Disposition: A | Discharge status: Home / Self Care | Payer: BC Managed Care – PPO | Source: Ambulatory Visit | Attending: Family Medicine | Admitting: Family Medicine

## 2020-02-16 ENCOUNTER — Other Ambulatory Visit: Payer: Self-pay

## 2020-02-16 ENCOUNTER — Encounter (HOSPITAL_COMMUNITY): Payer: Self-pay

## 2020-02-16 VITALS — BP 178/110 | HR 77 | Temp 98.7°F | Resp 18 | Ht 67.0 in | Wt 278.0 lb

## 2020-02-16 DIAGNOSIS — I10 Essential (primary) hypertension: Secondary | ICD-10-CM | POA: Diagnosis present

## 2020-02-16 DIAGNOSIS — J069 Acute upper respiratory infection, unspecified: Secondary | ICD-10-CM | POA: Diagnosis not present

## 2020-02-16 DIAGNOSIS — Z20822 Contact with and (suspected) exposure to covid-19: Secondary | ICD-10-CM | POA: Insufficient documentation

## 2020-02-16 DIAGNOSIS — H66001 Acute suppurative otitis media without spontaneous rupture of ear drum, right ear: Secondary | ICD-10-CM | POA: Diagnosis present

## 2020-02-16 MED ORDER — AZITHROMYCIN 250 MG PO TABS: 250.0000 mg | ORAL_TABLET | Freq: Every day | ORAL | 0 refills | Status: DC

## 2020-02-16 NOTE — Discharge Instructions (Addendum)
You have been tested for COVID-19 today. °If your test returns positive, you will receive a phone call from [facility] regarding your results. °Negative test results are not called. °Both positive and negative results area always visible on MyChart. °If you do not have a MyChart account, sign up instructions are provided in your discharge papers. °Please do not hesitate to contact us should you have questions or concerns. ° °

## 2020-02-16 NOTE — ED Triage Notes (Signed)
Pt c/o sinus pressure/congestion, right ear pain, sore throat since Saturday.  Denies fever, n/v/d, cough, SOB.  Has taken claritin, mucinex, and switched to zyrtec.

## 2020-02-17 LAB — SARS CORONAVIRUS 2 (TAT 6-24 HRS): SARS Coronavirus 2: NEGATIVE

## 2020-02-17 NOTE — ED Provider Notes (Signed)
[facility]   [id number] 02/16/20 Arrival Time: 1558  ASSESSMENT & PLAN:  1. Acute upper respiratory infection   2. Non-recurrent acute suppurative otitis media of right ear without spontaneous rupture of tympanic membrane   3. Elevated blood pressure reading in office with diagnosis of hypertension    Multiple antibiotic allergies reported. Begin: Meds ordered this encounter  Medications  . azithromycin (ZITHROMAX) 250 MG tablet    Sig: Take 1 tablet (250 mg total) by mouth daily. Take first 2 tablets together, then 1 every day until finished.    Dispense:  6 tablet    Refill:  0   COVID testing sent. OTC symptom care as needed. Ensure adequate fluid intake and rest. May f/u with PCP or here as needed.    Discharge Instructions     You have been tested for COVID-19 today. If your test returns positive, you will receive a phone call from [facility] regarding your results. Negative test results are not called. Both positive and negative results area always visible on MyChart. If you do not have a MyChart account, sign up instructions are provided in your discharge papers. Please do not hesitate to contact us should you have questions or concerns.     Will plan PCP f/u for BP check.  Reviewed expectations re: course of current medical issues. Questions answered. Outlined signs and symptoms indicating need for more acute intervention. Patient verbalized understanding. After Visit Summary given.   SUBJECTIVE: History from: patient.  Victoria Booth is a 40 y.o. female who presents with complaint of right otalgia; without drainage; without bleeding. Onset gradual, a week ago. Recent cold symptoms: yes. Fever: no. Overall normal PO intake without n/v. Sick contacts: no. OTC treatment: Mucinex and Claritin without much relief.  Social History   Tobacco Use  Smoking Status Former Smoker  . Packs/day: 0.10  . Types: Cigarettes  . Quit date: 04/06/2015   . Years since quitting: 4.8  Smokeless Tobacco Never Used       OBJECTIVE:  Vitals:   02/16/20 1655 02/16/20 1700  BP:  (!) 178/110  Pulse:  77  Resp:  18  Temp:  98.7 F (37.1 C)  TempSrc:  Oral  SpO2:  100%  Weight: 126.1 kg   Height: 5\' 7"  (1.702 m)      General appearance: alert; appears fatigued Ear Canal: normal TM: right: erythematous and bulging Neck: supple without LAD Lungs: unlabored respirations, symmetrical air entry; cough: absent; no respiratory distress Skin: warm and dry Psychological: alert and cooperative; normal mood and affect  Allergies  Allergen Reactions  . Penicillins Shortness Of Breath, Rash and Other (See Comments)    Has patient had a PCN reaction causing immediate rash, facial/tongue/throat swelling, SOB or lightheadedness with hypotension:yes Has patient had a PCN reaction causing severe rash involving mucus membranes or skin necrosis: no Has patient had a PCN reaction that required hospitalization : no Has patient had a PCN reaction occurring within the last 10 years:no  If all of the above answers are "NO", then may proceed with Cephalosporin use.   Lisinopril Cough  . Shellfish Allergy Swelling  . Sulfa Antibiotics Hives  . Iodine Rash  . Keflex [Cephalexin] Itching and Other (See Comments)    Itching hands and legs  . Latex Rash    Past Medical History:  Diagnosis Date  . Anemia   . Asthma   . Crohn disease (HCC)   . Hypertension   . LUQ abdominal pain  10/16/2017  . Microcytic anemia 12/01/2014  . Obesity    Family History  Problem Relation Age of Onset  . Diabetes Mother   . Hypertension Mother   . Heart attack Mother        2016- 52 yrs  . Breast cancer Maternal Grandmother   . Hypertension Father   . Colon cancer Neg Hx   . Stomach cancer Neg Hx   . Pancreatic cancer Neg Hx    Social History   Socioeconomic History  . Marital status: Significant Other    Spouse name: Not on file  . Number of children:  Not on file  . Years of education: Not on file  . Highest education level: Not on file  Occupational History  . Not on file  Tobacco Use  . Smoking status: Former Smoker    Packs/day: 0.10    Types: Cigarettes    Quit date: 04/06/2015    Years since quitting: 4.8  . Smokeless tobacco: Never Used  Vaping Use  . Vaping Use: Never used  Substance and Sexual Activity  . Alcohol use: No    Alcohol/week: 0.0 standard drinks  . Drug use: No  . Sexual activity: Not on file  Other Topics Concern  . Not on file  Social History Narrative   Ieft handed   Live alone second floor apartment   Social Determinants of Health   Financial Resource Strain: Not on file  Food Insecurity: Not on file  Transportation Needs: Not on file  Physical Activity: Not on file  Stress: Not on file  Social Connections: Not on file  Intimate Partner Violence: Not on file            Mardella Layman, MD 02/17/20 [phone]

## 2020-07-23 ENCOUNTER — Encounter: Payer: Self-pay | Admitting: Emergency Medicine

## 2020-07-23 ENCOUNTER — Other Ambulatory Visit: Payer: Self-pay

## 2020-07-23 ENCOUNTER — Ambulatory Visit
Admission: EM | Admit: 2020-07-23 | Discharge: 2020-07-23 | Disposition: A | Discharge status: Home / Self Care | Payer: BC Managed Care – PPO | Attending: Emergency Medicine | Admitting: Emergency Medicine

## 2020-07-23 DIAGNOSIS — J4521 Mild intermittent asthma with (acute) exacerbation: Secondary | ICD-10-CM | POA: Diagnosis not present

## 2020-07-23 MED ORDER — DEXAMETHASONE SODIUM PHOSPHATE 10 MG/ML IJ SOLN
10.0000 mg | Freq: Once | INTRAMUSCULAR | Status: AC
  Administered 2020-07-23: 10 mg via INTRAMUSCULAR

## 2020-07-23 MED ORDER — PREDNISONE 50 MG PO TABS: 50.0000 mg | ORAL_TABLET | Freq: Every day | ORAL | 0 refills | Status: AC

## 2020-07-23 MED ORDER — DM-GUAIFENESIN ER 30-600 MG PO TB12: 1.0000 | ORAL_TABLET | Freq: Two times a day (BID) | ORAL | 0 refills | Status: DC

## 2020-07-23 NOTE — Discharge Instructions (Addendum)
Please continue albuterol nebulizers or inhaler as needed for shortness of breath chest tightness and wheezing We gave you a shot of Decadron, continue with prednisone daily x5 days May use Mucinex DM to further help with congestion and cough Continue daily Zyrtec or Claritin Rest and fluids Your blood pressure is elevated today, avoid Sudafed, continue to monitor and take blood pressure medicines as prescribed Follow-up if not improving or worsening

## 2020-07-23 NOTE — ED Triage Notes (Signed)
Pt sts increased asthma sx starting yesterday; pt sts little relief with her inhaler; pt noted hypertensive sts is typical for her at times

## 2020-07-23 NOTE — ED Provider Notes (Signed)
[facility]    CSN: [id number] Arrival date & time: 07/23/20  1434      History   Chief Complaint Chief Complaint  Patient presents with   Asthma    HPI Victoria Booth is a 40 y.o. female history of asthma, hypertension, presenting today for evaluation of shortness of breath.  Reports that she has had flaring of her asthma over the past few days.  Reports increased chest tightness shortness of breath and wheezing.  She reports approximately 1 week ago she was having congestion and postnasal drainage which she feels has flared her symptoms as well as noted symptoms flaring when the weather is warmer.  She denies any fevers chills or body aches.  HPI  Past Medical History:  Diagnosis Date   Anemia    Asthma    Crohn disease (HCC)    Hypertension    LUQ abdominal pain 10/16/2017   Microcytic anemia 12/01/2014   Obesity     Patient Active Problem List   Diagnosis Date Noted   Anxiety 03/04/2019   Hypertensive urgency 12/14/2018   Vitamin D deficiency 08/04/2018   Nephrolithiasis 07/14/2018   Functional dyspepsia 02/20/2018   IBS (irritable colon syndrome) 02/20/2018   Iron deficiency anemia 08/18/2017   Acute pain of left shoulder 08/18/2017   Insomnia 07/29/2017   Left lower quadrant abdominal pain of unknown etiology 07/13/2017   OSA (obstructive sleep apnea) 06/05/2017   Major depressive disorder 03/21/2017   Migraine 10/24/2016   Hematuria 09/24/2016   Prediabetes 08/21/2016   Allergic rhinitis 06/18/2016   Left flank pain 06/05/2016   PCOS (polycystic ovarian syndrome) 04/05/2016   Essential hypertension, benign 12/01/2014   Asthma, chronic 12/01/2014   Gastroesophageal reflux disease 12/01/2014   Preventative health care 12/01/2014    Past Surgical History:  Procedure Laterality Date   EXTRACORPOREAL SHOCK WAVE LITHOTRIPSY Left 07/16/2018   Procedure: EXTRACORPOREAL SHOCK WAVE LITHOTRIPSY (ESWL);  Surgeon: Vanna Scotland, MD;  Location:  [facility];  Service: Urology;  Laterality: Left;   TOENAIL EXCISION      OB History   No obstetric history on file.      Home Medications    Prior to Admission medications   Medication Sig Start Date End Date Taking? Authorizing Provider  dextromethorphan-guaiFENesin (MUCINEX DM) 30-600 MG 12hr tablet Take 1 tablet by mouth 2 (two) times daily. 07/23/20  Yes Malvern Kadlec C, PA-C  predniSONE (DELTASONE) 50 MG tablet Take 1 tablet (50 mg total) by mouth daily with breakfast for 5 days. 07/23/20 07/28/20 Yes Jandel Patriarca C, PA-C  acetaminophen (TYLENOL) 500 MG tablet Take 1,000 mg by mouth every 6 (six) hours as needed for headache.    [provider]  albuterol (ACCUNEB) 1.25 MG/3ML nebulizer solution Take 3 mLs (1.25 mg total) by nebulization every 6 (six) hours as needed for wheezing. 02/16/19   Helberg, Jill Alexanders, MD  albuterol (VENTOLIN HFA) 108 (90 Base) MCG/ACT inhaler Inhale 2 puffs into the lungs every 6 (six) hours as needed for wheezing or shortness of breath. 02/16/19   Helberg, Jill Alexanders, MD  amLODIPine-Valsartan-HCTZ 10-160-25 MG TABS Take 10 mg by mouth daily. 06/02/19   Bloomfield, Carley D, DO  Aspirin-Salicylamide-Caffeine (BC HEADACHE POWDER PO) Take 2 packets by mouth daily as needed (severe headache).    [provider]  azelastine (ASTELIN) 0.1 % nasal spray Place 1 spray into both nostrils 2 (two) times daily. Use in each nostril as directed Patient not taking: Reported on 06/03/2019 05/25/19   [name],  Gerilyn PilgrimJacob, PA-C  cetirizine (ZYRTEC ALLERGY) 10 MG tablet Take 1 tablet (10 mg total) by mouth daily. 05/25/19 06/24/19  [name], Gerilyn PilgrimJacob, PA-C  Cyanocobalamin (VITAMIN B-12 PO) Take 1 tablet by mouth daily.    [provider]  fluticasone (FLONASE) 50 MCG/ACT nasal spray Place 1 spray into both nostrils daily for 14 days. 05/25/19 06/08/19  [name], Gerilyn PilgrimJacob, PA-C  ondansetron (ZOFRAN ODT) 4 MG disintegrating tablet Take 1 tablet (4 mg total) by mouth every 8 (eight) hours as  needed for nausea or vomiting. 06/03/19   Everlena CooperJaffe, Adam R, DO  ondansetron (ZOFRAN) 4 MG tablet Take 1 tablet (4 mg total) by mouth every 8 (eight) hours as needed for nausea or vomiting. 05/25/19   [name], Gerilyn PilgrimJacob, PA-C  pantoprazole (PROTONIX) 40 MG tablet TAKE 1 TABLET BY MOUTH TWICE A DAY Patient taking differently: Take 40 mg by mouth 2 (two) times daily. 10/06/18   Lorenso Courierhundi, Vahini, MD  spironolactone (ALDACTONE) 50 MG tablet Take by mouth. 09/03/19   [provider]  SUMAtriptan (IMITREX) 50 MG tablet Take 1 tablet (50 mg total) by mouth once as needed for migraine. May repeat in 2 hours if headache persists or recurs. Patient taking differently: Take 50 mg by mouth See admin instructions. Take one tablet (50 mg) by mouth once as needed for migraine headache, may repeat in 2 hours if headache persists or recurs. 09/22/18 04/09/19  Lorenso Courierhundi, Vahini, MD  topiramate (TOPAMAX) 50 MG tablet Take 1 tablet (50 mg total) by mouth at bedtime. 06/03/19   Drema DallasJaffe, Adam R, DO    Family History Family History  Problem Relation Age of Onset   Diabetes Mother    Hypertension Mother    Heart attack Mother        2016- 1952 yrs   Breast cancer Maternal Grandmother    Hypertension Father    Colon cancer Neg Hx    Stomach cancer Neg Hx    Pancreatic cancer Neg Hx     Social History Social History   Tobacco Use   Smoking status: Former    Packs/day: 0.10    Pack years: 0.00    Types: Cigarettes    Quit date: 04/06/2015    Years since quitting: 5.3   Smokeless tobacco: Never  Vaping Use   Vaping Use: Never used  Substance Use Topics   Alcohol use: No    Alcohol/week: 0.0 standard drinks   Drug use: No     Allergies   Penicillins, Lisinopril, Shellfish allergy, Sulfa antibiotics, Iodine, Keflex [cephalexin], and Latex   Review of Systems Review of Systems  Constitutional:  Negative for activity change, appetite change, chills, fatigue and fever.  HENT:  Positive for congestion. Negative for  ear pain, rhinorrhea, sinus pressure, sore throat and trouble swallowing.   Eyes:  Negative for discharge and redness.  Respiratory:  Positive for cough, shortness of breath and wheezing. Negative for chest tightness.   Cardiovascular:  Negative for chest pain.  Gastrointestinal:  Negative for abdominal pain, diarrhea, nausea and vomiting.  Musculoskeletal:  Negative for myalgias.  Skin:  Negative for rash.  Neurological:  Negative for dizziness, light-headedness and headaches.    Physical Exam Triage Vital Signs ED Triage Vitals  Enc Vitals Group     BP      Pulse      Resp      Temp      Temp src      SpO2      Weight  Height      Head Circumference      Peak Flow      Pain Score      Pain Loc      Pain Edu?      Excl. in GC?    No data found.  Updated Vital Signs BP (!) 193/104 (BP Location: Left Arm)   Pulse 77   Temp 98.2 F (36.8 C) (Oral)   Resp 18   SpO2 97%   Visual Acuity Right Eye Distance:   Left Eye Distance:   Bilateral Distance:    Right Eye Near:   Left Eye Near:    Bilateral Near:     Physical Exam Vitals and nursing note reviewed.  Constitutional:      Appearance: She is well-developed.     Comments: No acute distress  HENT:     Head: Normocephalic and atraumatic.     Ears:     Comments: Bilateral ears without tenderness to palpation of external auricle, tragus and mastoid, EAC's without erythema or swelling, TM's with good bony landmarks and cone of light. Non erythematous.      Nose: Nose normal.     Mouth/Throat:     Comments: Oral mucosa pink and moist, no tonsillar enlargement or exudate. Posterior pharynx patent and nonerythematous, no uvula deviation or swelling. Normal phonation.  Eyes:     Conjunctiva/sclera: Conjunctivae normal.  Cardiovascular:     Rate and Rhythm: Normal rate.  Pulmonary:     Effort: Pulmonary effort is normal. No respiratory distress.     Comments: Breathing comfortably at rest, CTABL, no  wheezing, rales or other adventitious sounds auscultated  Abdominal:     General: There is no distension.  Musculoskeletal:        General: Normal range of motion.     Cervical back: Neck supple.  Skin:    General: Skin is warm and dry.  Neurological:     Mental Status: She is alert and oriented to person, place, and time.     UC Treatments / Results  Labs (all labs ordered are listed, but only abnormal results are displayed) Labs Reviewed - No data to display  EKG   Radiology No results found.  Procedures Procedures (including critical care time)  Medications Ordered in UC Medications  dexamethasone (DECADRON) injection 10 mg (has no administration in time range)    Initial Impression / Assessment and Plan / UC Course  I have reviewed the triage vital signs and the nursing notes.  Pertinent labs & imaging results that were available during my care of the patient were reviewed by me and considered in my medical decision making (see chart for details).     Asthma exacerbation-no audible wheezing noted on exam today, but given patient's history and reported chest tightness opting to proceed with treatment for exacerbation with Decadron prior to discharge, prednisone course x5 days, reports nebulizer at home broken but she has nebulizer solution.  We will provide nebulizer machine today to take home to perform breathing treatment at home.  Continue albuterol inhaler as needed.  Rest and fluids.  Continue symptomatic and supportive care of congestion and drainage.  O2 stable without hypoxia.  Discussed strict return precautions. Patient verbalized understanding and is agreeable with plan.  Final Clinical Impressions(s) / UC Diagnoses   Final diagnoses:  Mild intermittent asthma with acute exacerbation     Discharge Instructions      Please continue albuterol nebulizers or inhaler as needed for shortness  of breath chest tightness and wheezing We gave you a shot of  Decadron, continue with prednisone daily x5 days May use Mucinex DM to further help with congestion and cough Continue daily Zyrtec or Claritin Rest and fluids Your blood pressure is elevated today, avoid Sudafed, continue to monitor and take blood pressure medicines as prescribed Follow-up if not improving or worsening     ED Prescriptions     Medication Sig Dispense Auth. Provider   predniSONE (DELTASONE) 50 MG tablet Take 1 tablet (50 mg total) by mouth daily with breakfast for 5 days. 5 tablet Alvenia Treese C, PA-C   dextromethorphan-guaiFENesin (MUCINEX DM) 30-600 MG 12hr tablet Take 1 tablet by mouth 2 (two) times daily. 15 tablet Nola Botkins, [place] C, PA-C      PDMP not reviewed this encounter.   Lew Dawes, [place] 07/23/20 [phone]

## 2020-07-26 ENCOUNTER — Emergency Department (HOSPITAL_COMMUNITY): Payer: BC Managed Care – PPO

## 2020-07-26 ENCOUNTER — Emergency Department (HOSPITAL_COMMUNITY)
Admission: EM | Admit: 2020-07-26 | Discharge: 2020-07-26 | Disposition: A | Discharge status: Home / Self Care | Payer: BC Managed Care – PPO | Attending: Emergency Medicine | Admitting: Emergency Medicine

## 2020-07-26 DIAGNOSIS — Z7952 Long term (current) use of systemic steroids: Secondary | ICD-10-CM | POA: Diagnosis not present

## 2020-07-26 DIAGNOSIS — J1282 Pneumonia due to coronavirus disease 2019: Secondary | ICD-10-CM | POA: Insufficient documentation

## 2020-07-26 DIAGNOSIS — I10 Essential (primary) hypertension: Secondary | ICD-10-CM | POA: Insufficient documentation

## 2020-07-26 DIAGNOSIS — Z7982 Long term (current) use of aspirin: Secondary | ICD-10-CM | POA: Diagnosis not present

## 2020-07-26 DIAGNOSIS — Z87891 Personal history of nicotine dependence: Secondary | ICD-10-CM | POA: Diagnosis not present

## 2020-07-26 DIAGNOSIS — R0602 Shortness of breath: Secondary | ICD-10-CM | POA: Diagnosis present

## 2020-07-26 DIAGNOSIS — Z91040 Latex allergy status: Secondary | ICD-10-CM | POA: Diagnosis not present

## 2020-07-26 DIAGNOSIS — J45909 Unspecified asthma, uncomplicated: Secondary | ICD-10-CM | POA: Insufficient documentation

## 2020-07-26 DIAGNOSIS — U071 COVID-19: Secondary | ICD-10-CM | POA: Insufficient documentation

## 2020-07-26 DIAGNOSIS — Z79899 Other long term (current) drug therapy: Secondary | ICD-10-CM | POA: Insufficient documentation

## 2020-07-26 LAB — COMPREHENSIVE METABOLIC PANEL
ALT: 50 U/L — ABNORMAL HIGH (ref 0–44)
AST: 27 U/L (ref 15–41)
Albumin: 3.2 g/dL — ABNORMAL LOW (ref 3.5–5.0)
Alkaline Phosphatase: 82 U/L (ref 38–126)
Anion gap: 6 (ref 5–15)
BUN: 11 mg/dL (ref 6–20)
CO2: 24 mmol/L (ref 22–32)
Calcium: 8.5 mg/dL — ABNORMAL LOW (ref 8.9–10.3)
Chloride: 108 mmol/L (ref 98–111)
Creatinine, Ser: 0.72 mg/dL (ref 0.44–1.00)
GFR, Estimated: >60 mL/min (ref >60)
Glucose, Bld: 108 mg/dL — ABNORMAL HIGH (ref 70–99)
Potassium: 3.7 mmol/L (ref 3.5–5.1)
Sodium: 138 mmol/L (ref 135–145)
Total Bilirubin: 0.4 mg/dL (ref 0.3–1.2)
Total Protein: 6.7 g/dL (ref 6.5–8.1)

## 2020-07-26 LAB — URINALYSIS, ROUTINE W REFLEX MICROSCOPIC
Bilirubin Urine: NEGATIVE
Glucose, UA: NEGATIVE mg/dL
Hgb urine dipstick: NEGATIVE
Ketones, ur: NEGATIVE mg/dL
Leukocytes,Ua: NEGATIVE
Nitrite: NEGATIVE
Protein, ur: NEGATIVE mg/dL
Specific Gravity, Urine: 1.02 (ref 1.005–1.030)
pH: 6 (ref 5.0–8.0)

## 2020-07-26 LAB — CBC
HCT: 35 % — ABNORMAL LOW (ref 36.0–46.0)
Hemoglobin: 10.3 g/dL — ABNORMAL LOW (ref 12.0–15.0)
MCH: 22.1 pg — ABNORMAL LOW (ref 26.0–34.0)
MCHC: 29.4 g/dL — ABNORMAL LOW (ref 30.0–36.0)
MCV: 75.1 fL — ABNORMAL LOW (ref 80.0–100.0)
Platelets: 264 10*3/uL (ref 150–400)
RBC: 4.66 MIL/uL (ref 3.87–5.11)
RDW: 19.3 % — ABNORMAL HIGH (ref 11.5–15.5)
WBC: 11.8 10*3/uL — ABNORMAL HIGH (ref 4.0–10.5)
nRBC: 0.2 % (ref 0.0–0.2)

## 2020-07-26 LAB — RESP PANEL BY RT-PCR (FLU A&B, COVID) ARPGX2
Influenza A by PCR: NEGATIVE
Influenza B by PCR: NEGATIVE
SARS Coronavirus 2 by RT PCR: POSITIVE — AB

## 2020-07-26 LAB — I-STAT BETA HCG BLOOD, ED (MC, WL, AP ONLY): I-stat hCG, quantitative: <5 m[IU]/mL (ref <5)

## 2020-07-26 LAB — TROPONIN I (HIGH SENSITIVITY)
Troponin I (High Sensitivity): 5 ng/L (ref <18)
Troponin I (High Sensitivity): 6 ng/L (ref <18)

## 2020-07-26 LAB — D-DIMER, QUANTITATIVE: D-Dimer, Quant: 1.52 ug/mL-FEU — ABNORMAL HIGH (ref 0.00–0.50)

## 2020-07-26 LAB — LIPASE, BLOOD: Lipase: 23 U/L (ref 11–51)

## 2020-07-26 MED ORDER — DOXYCYCLINE HYCLATE 100 MG PO CAPS: 100.0000 mg | ORAL_CAPSULE | Freq: Two times a day (BID) | ORAL | 0 refills | Status: AC

## 2020-07-26 MED ORDER — ACETAMINOPHEN 325 MG PO TABS
650.0000 mg | ORAL_TABLET | Freq: Once | ORAL | Status: AC
  Administered 2020-07-26: 650 mg via ORAL
  Filled 2020-07-26: qty 2

## 2020-07-26 MED ORDER — BENZONATATE 200 MG PO CAPS: 200.0000 mg | ORAL_CAPSULE | Freq: Three times a day (TID) | ORAL | 0 refills | Status: AC

## 2020-07-26 MED ORDER — IOHEXOL 350 MG/ML SOLN
100.0000 mL | Freq: Once | INTRAVENOUS | Status: AC | PRN
  Administered 2020-07-26: 100 mL via INTRAVENOUS

## 2020-07-26 NOTE — ED Notes (Signed)
Patient transported to CT 

## 2020-07-26 NOTE — ED Provider Notes (Signed)
[facility] Provider Note   CSN: [id number] Arrival date & time: 07/26/20  0801     History No chief complaint on file.   Victoria Booth is a 40 y.o. female.  40 year old female with history of asthma, hypertension, anemia and Crohn's disease presents with complaint of shortness of breath.  Patient reports sinus congestion onset 2 weeks ago, felt to be her normal allergies.  Patient went to urgent care on Sunday for wheezing and shortness of breath, given medicine for her nebulizer as well as injection of Decadron and prednisone pack.  Patient is taking his medications without improvement, woke up this morning unable to breathe, used her nebulizer without any relief and presents to the ER with ongoing shortness of breath and cough.  Reports vomiting today and unable to keep down her blood pressure medication.  Denies fevers, chills, sick contacts.  Denies lower extremity swelling, chest pain.  No other complaints or concerns.      Past Medical History:  Diagnosis Date   Anemia    Asthma    Crohn disease (HCC)    Hypertension    LUQ abdominal pain 10/16/2017   Microcytic anemia 12/01/2014   Obesity     Patient Active Problem List   Diagnosis Date Noted   Anxiety 03/04/2019   Hypertensive urgency 12/14/2018   Vitamin D deficiency 08/04/2018   Nephrolithiasis 07/14/2018   Functional dyspepsia 02/20/2018   IBS (irritable colon syndrome) 02/20/2018   Iron deficiency anemia 08/18/2017   Acute pain of left shoulder 08/18/2017   Insomnia 07/29/2017   Left lower quadrant abdominal pain of unknown etiology 07/13/2017   OSA (obstructive sleep apnea) 06/05/2017   Major depressive disorder 03/21/2017   Migraine 10/24/2016   Hematuria 09/24/2016   Prediabetes 08/21/2016   Allergic rhinitis 06/18/2016   Left flank pain 06/05/2016   PCOS (polycystic ovarian syndrome) 04/05/2016   Essential hypertension, benign 12/01/2014   Asthma, chronic  12/01/2014   Gastroesophageal reflux disease 12/01/2014   Preventative health care 12/01/2014    Past Surgical History:  Procedure Laterality Date   EXTRACORPOREAL SHOCK WAVE LITHOTRIPSY Left 07/16/2018   Procedure: EXTRACORPOREAL SHOCK WAVE LITHOTRIPSY (ESWL);  Surgeon: Brandon, Ashley, MD;  Location: [facility] ORS;  Service: Urology;  Laterality: Left;   TOENAIL EXCISION       OB History   No obstetric history on file.     Family History  Problem Relation Age of Onset   Diabetes Mother    Hypertension Mother    Heart attack Mother        20 16- 52 yrs   Breast cancer Maternal Grandmother    Hypertension Father    Colon cancer Neg Hx    Stomach cancer Neg Hx    Pancreatic cancer Neg Hx     Social History   Tobacco Use   Smoking status: Former    Packs/day: 0.10    Pack years: 0.00    Types: Cigarettes    Quit date: 04/06/2015    Years since quitting: 5.3   Smokeless tobacco: Never  Vaping Use   Vaping Use: Never used  Substance Use Topics   Alcohol use: No    Alcohol/week: 0.0 standard drinks   Drug use: No    Home Medications Prior to Admission medications   Medication Sig Start Date End Date Taking? Authorizing Provider  benzonatate (TESSALON) 200 MG capsule Take 1 capsule (200 mg total) by mouth every 8 (eight) hours for 10 days. 07/26/20 08/05/20  Yes Army Melia A, PA-C  doxycycline (VIBRAMYCIN) 100 MG capsule Take 1 capsule (100 mg total) by mouth 2 (two) times daily for 5 days. 07/26/20 07/31/20 Yes Jeannie Fend, PA-C  acetaminophen (TYLENOL) 500 MG tablet Take 1,000 mg by mouth every 6 (six) hours as needed for headache.    [provider]  albuterol (ACCUNEB) 1.25 MG/3ML nebulizer solution Take 3 mLs (1.25 mg total) by nebulization every 6 (six) hours as needed for wheezing. 02/16/19   Helberg, Jill Alexanders, MD  albuterol (VENTOLIN HFA) 108 (90 Base) MCG/ACT inhaler Inhale 2 puffs into the lungs every 6 (six) hours as needed for wheezing or shortness of  breath. 02/16/19   Helberg, Jill Alexanders, MD  amLODIPine-Valsartan-HCTZ 10-160-25 MG TABS Take 10 mg by mouth daily. 06/02/19   Bloomfield, Carley D, DO  Aspirin-Salicylamide-Caffeine (BC HEADACHE POWDER PO) Take 2 packets by mouth daily as needed (severe headache).    [provider]  azelastine (ASTELIN) 0.1 % nasal spray Place 1 spray into both nostrils 2 (two) times daily. Use in each nostril as directed Patient not taking: Reported on 06/03/2019 05/25/19   [name], Gerilyn Pilgrim, PA-C  cetirizine (ZYRTEC ALLERGY) 10 MG tablet Take 1 tablet (10 mg total) by mouth daily. 05/25/19 06/24/19  [name], Gerilyn Pilgrim, PA-C  Cyanocobalamin (VITAMIN B-12 PO) Take 1 tablet by mouth daily.    [provider]  dextromethorphan-guaiFENesin (MUCINEX DM) 30-600 MG 12hr tablet Take 1 tablet by mouth 2 (two) times daily. 07/23/20   Wieters, Hallie C, PA-C  fluticasone (FLONASE) 50 MCG/ACT nasal spray Place 1 spray into both nostrils daily for 14 days. 05/25/19 06/08/19  [name], Gerilyn Pilgrim, PA-C  ondansetron (ZOFRAN ODT) 4 MG disintegrating tablet Take 1 tablet (4 mg total) by mouth every 8 (eight) hours as needed for nausea or vomiting. 06/03/19   Everlena Cooper, Adam R, DO  ondansetron (ZOFRAN) 4 MG tablet Take 1 tablet (4 mg total) by mouth every 8 (eight) hours as needed for nausea or vomiting. 05/25/19   [name], Gerilyn Pilgrim, PA-C  pantoprazole (PROTONIX) 40 MG tablet TAKE 1 TABLET BY MOUTH TWICE A DAY Patient taking differently: Take 40 mg by mouth 2 (two) times daily. 10/06/18   Chundi, Sherlyn Lees, MD  predniSONE (DELTASONE) 50 MG tablet Take 1 tablet (50 mg total) by mouth daily with breakfast for 5 days. 07/23/20 07/28/20  Wieters, Hallie C, PA-C  spironolactone (ALDACTONE) 50 MG tablet Take by mouth. 09/03/19   [provider]  SUMAtriptan (IMITREX) 50 MG tablet Take 1 tablet (50 mg total) by mouth once as needed for migraine. May repeat in 2 hours if headache persists or recurs. Patient taking differently: Take 50 mg by mouth See admin  instructions. Take one tablet (50 mg) by mouth once as needed for migraine headache, may repeat in 2 hours if headache persists or recurs. 09/22/18 04/09/19  Lorenso Courier, MD  topiramate (TOPAMAX) 50 MG tablet Take 1 tablet (50 mg total) by mouth at bedtime. 06/03/19   Drema Dallas, DO    Allergies    Penicillins, Lisinopril, Shellfish allergy, Sulfa antibiotics, Iodine, Keflex [cephalexin], and Latex  Review of Systems   Review of Systems  Constitutional:  Negative for chills, diaphoresis and fever.  HENT:  Positive for congestion. Negative for sore throat.   Respiratory:  Positive for cough, shortness of breath and wheezing.   Gastrointestinal:  Positive for nausea and vomiting. Negative for abdominal pain, constipation and diarrhea.  Musculoskeletal:  Negative for arthralgias and myalgias.  Skin:  Negative for wound.  Allergic/Immunologic: Positive for immunocompromised state.  Neurological:  Negative for weakness.  Hematological:  Negative for adenopathy.  Psychiatric/Behavioral:  Negative for confusion.   All other systems reviewed and are negative.  Physical Exam Updated Vital Signs BP (!) 166/96   Pulse 87   Temp 98.1 F (36.7 C) (Oral)   Resp (!) 32   SpO2 97%   Physical Exam Vitals and nursing note reviewed.  Constitutional:      General: She is not in acute distress.    Appearance: She is well-developed. She is obese. She is not diaphoretic.  HENT:     Head: Normocephalic and atraumatic.  Eyes:     Conjunctiva/sclera: Conjunctivae normal.  Cardiovascular:     Rate and Rhythm: Normal rate and regular rhythm.     Pulses: Normal pulses.     Heart sounds: Normal heart sounds.  Pulmonary:     Effort: Pulmonary effort is normal.     Breath sounds: Normal breath sounds.  Abdominal:     Palpations: Abdomen is soft.     Tenderness: There is no abdominal tenderness.  Musculoskeletal:        General: No tenderness.     Cervical back: Neck supple.     Right lower  leg: No edema.     Left lower leg: No edema.  Lymphadenopathy:     Cervical: No cervical adenopathy.  Skin:    General: Skin is warm and dry.     Findings: No erythema or rash.  Neurological:     Mental Status: She is alert and oriented to person, place, and time.     Motor: No weakness.  Psychiatric:        Behavior: Behavior normal.    ED Results / Procedures / Treatments   Labs (all labs ordered are listed, but only abnormal results are displayed) Labs Reviewed  RESP PANEL BY RT-PCR (FLU A&B, COVID) ARPGX2 - Abnormal; Notable for the following components:      Result Value   SARS Coronavirus 2 by RT PCR POSITIVE (*)    All other components within normal limits  COMPREHENSIVE METABOLIC PANEL - Abnormal; Notable for the following components:   Glucose, Bld 108 (*)    Calcium 8.5 (*)    Albumin 3.2 (*)    ALT 50 (*)    All other components within normal limits  CBC - Abnormal; Notable for the following components:   WBC 11.8 (*)    Hemoglobin 10.3 (*)    HCT 35.0 (*)    MCV 75.1 (*)    MCH 22.1 (*)    MCHC 29.4 (*)    RDW 19.3 (*)    All other components within normal limits  D-DIMER, QUANTITATIVE - Abnormal; Notable for the following components:   D-Dimer, Quant 1.52 (*)    All other components within normal limits  LIPASE, BLOOD  URINALYSIS, ROUTINE W REFLEX MICROSCOPIC  I-STAT BETA HCG BLOOD, ED ([facility], [facility], [facility] ONLY)  TROPONIN I (HIGH SENSITIVITY)  TROPONIN I (HIGH SENSITIVITY)    EKG EKG Interpretation  Date/Time:  Wednesday July 26 2020 08:04:07 EDT Ventricular Rate:  79 PR Interval:  146 QRS Duration: 68 QT Interval:  346 QTC Calculation: 396 R Axis:   38 Text Interpretation: Normal sinus rhythm Low voltage QRS Cannot rule out Anterior infarct , age undetermined Abnormal ECG no sig change from previous Confirmed by Arby Barrette [phone]) on 07/26/2020 4:28:27 PM  Radiology DG Chest 2 View  Result Date: 07/26/2020 CLINICAL DATA:  Chest pain and cough  EXAM: CHEST - 2 VIEW COMPARISON:  March 12, 2019 FINDINGS: There is ill-defined airspace opacity in the left lower lung region. The lungs elsewhere are clear. Heart size and pulmonary vascularity are normal. No adenopathy. No bone lesions IMPRESSION: Ill-defined airspace opacity left lower lobe consistent with pneumonia. Lungs elsewhere clear. Cardiac silhouette normal. Note that atypical organism pneumonia could present in this manner. Check of COVID-19 status may be warranted. Electronically Signed   By: Bretta Bang III M.D.   On: 07/26/2020 09:14   CT Angio Chest PE W/Cm &/Or Wo Cm  Result Date: 07/26/2020 CLINICAL DATA:  Pulmonary embolus suspected with high probability. COVID positive. Headache and shortness of breath. History of asthma. EXAM: CT ANGIOGRAPHY CHEST WITH CONTRAST TECHNIQUE: Multidetector CT imaging of the chest was performed using the standard protocol during bolus administration of intravenous contrast. Multiplanar CT image reconstructions and MIPs were obtained to evaluate the vascular anatomy. CONTRAST:  OMNIPAQUE IOHEXOL 350 MG/ML SOLN COMPARISON:  Chest radiograph 07/26/2020 FINDINGS: Cardiovascular: Motion artifact limits the examination. There is moderately good opacification of the central and segmental pulmonary arteries. No focal filling defects are specifically identified. No evidence of significant pulmonary embolus. Normal heart size. No pericardial effusions. Normal caliber thoracic aorta. No aortic dissection. Mediastinum/Nodes: Esophagus is decompressed. No significant lymphadenopathy. Lungs/Pleura: Patchy areas of airspace infiltration throughout the left lung and in the right lung base. Changes likely represent multifocal pneumonia. There is a small left pleural effusion. No pneumothorax. Upper Abdomen: Visualized upper abdominal contents demonstrate no acute abnormality. Musculoskeletal: Degenerative changes in the spine. No destructive bone lesions. Review of  the MIP images confirms the above findings. IMPRESSION: 1. Motion artifact limits examination but no evidence of significant pulmonary embolus. 2. Patchy airspace infiltrates throughout the left lung and in the right lung base likely resent multifocal pneumonia. 3. Small left pleural effusion. Electronically Signed   By: Burman Nieves M.D.   On: 07/26/2020 17:22    Procedures Procedures   Medications Ordered in ED Medications  acetaminophen (TYLENOL) tablet 650 mg (has no administration in time range)  iohexol (OMNIPAQUE) 350 MG/ML injection 100 mL (100 mLs Intravenous Contrast Given 07/26/20 1716)    ED Course  I have reviewed the triage vital signs and the nursing notes.  Pertinent labs & imaging results that were available during my care of the patient were reviewed by me and considered in my medical decision making (see chart for details).  Clinical Course as of 07/26/20 1750  Wed Jul 26, 2020  546 40 year old female with history of asthma hypertension presents with shortness of breath for the past 2 weeks.  Seen by urgent care 3 days ago and given injection of Decadron with prednisone Dosepak.  No improvement in symptoms, woke up this morning with significant shortness of breath, not improved with nebulizer treatment. Lungs are clear to auscultation.  No lower extremity edema or calf tenderness. Chest x-ray with concern for COVID, and on COVID test. D-dimer is elevated, possibly secondary to COVID however with her complaint of shortness of breath mild tachypnea, will plan for CT PE study. CBC with mild leukocytosis that 11.8, possibly related to steroids.  Troponins without significant findings including 5 and 6.  CMP without significant changes. [LM]  1747 CT chest returns negative for PE, does show multifocal pneumonia. Suspect patient's pneumonia is secondary to COVID, likely viral.  Due to her history of asthma, will cover with antibiotics for potential secondary pneumonia.   Timeline  for patient's illness is complicated, she reports sinus congestion onset 2 weeks ago, worsening asthma symptoms 3 days ago.  Patient had not had a COVID test until today which is positive. Case discussed with Dr. Lynelle Doctor, ER attending, agrees with plan of care. Referred to post covid clinic for followup. Patient has a pulse ox at home, will continue with her home neb treatments and return to the ER as needed. [LM]    Clinical Course User Index [LM] Alden Hipp   MDM Rules/Calculators/A&P                           Final Clinical Impression(s) / ED Diagnoses Final diagnoses:  Pneumonia due to COVID-19 virus    Rx / DC Orders ED Discharge Orders          Ordered    doxycycline (VIBRAMYCIN) 100 MG capsule  2 times daily        07/26/20 1745    benzonatate (TESSALON) 200 MG capsule  Every 8 hours        07/26/20 1746             Jeannie Fend, PA-C 07/26/20 1750    Arby Barrette, MD 07/27/20 1610

## 2020-07-26 NOTE — ED Triage Notes (Signed)
Pt here with reports of worsening shob. Seen at [facility] on Sunday and given steroids and nebulizer. Pt also reports nausea/vomiting and abdominal pain.

## 2020-07-26 NOTE — Discharge Instructions (Addendum)
Take doxycycline as prescribed and complete the full course. Recommend Tylenol as needed as directed for headaches and body aches.  Given prescription for Tessalon to take for cough.  You can also take Coricidin HBP for symptom relief.  Return to emergency room for worsening or concerning symptoms.  Referral given for post-COVID care clinic follow-up as needed.

## 2020-07-26 NOTE — ED Notes (Signed)
Help get patient into a gown on the monitor patient is resting with call bell in reach 

## 2020-07-26 NOTE — ED Notes (Signed)
Hooked patient back up to the monitor patient is resting with call bell in reach 

## 2020-08-01 ENCOUNTER — Telehealth: Payer: Self-pay | Admitting: Nurse Practitioner

## 2020-08-01 ENCOUNTER — Telehealth: Payer: BC Managed Care – PPO

## 2020-08-01 ENCOUNTER — Other Ambulatory Visit: Payer: Self-pay

## 2020-08-01 NOTE — Telephone Encounter (Signed)
Attempted to contact patient x3 attempts for virtual visit this am. No answer and no available voicemail.

## 2020-08-03 IMAGING — DX DG ABDOMEN ACUTE W/ 1V CHEST
3 series · 3 of 3 positions shown · non-contrast
Comparison: 04/25/2018, CT 10/28/2017

CLINICAL DATA: 37-year-old female with a history of left upper
quadrant and lower abdominal pain

EXAM:
DG ABDOMEN ACUTE W/ 1V CHEST

[chest pa]
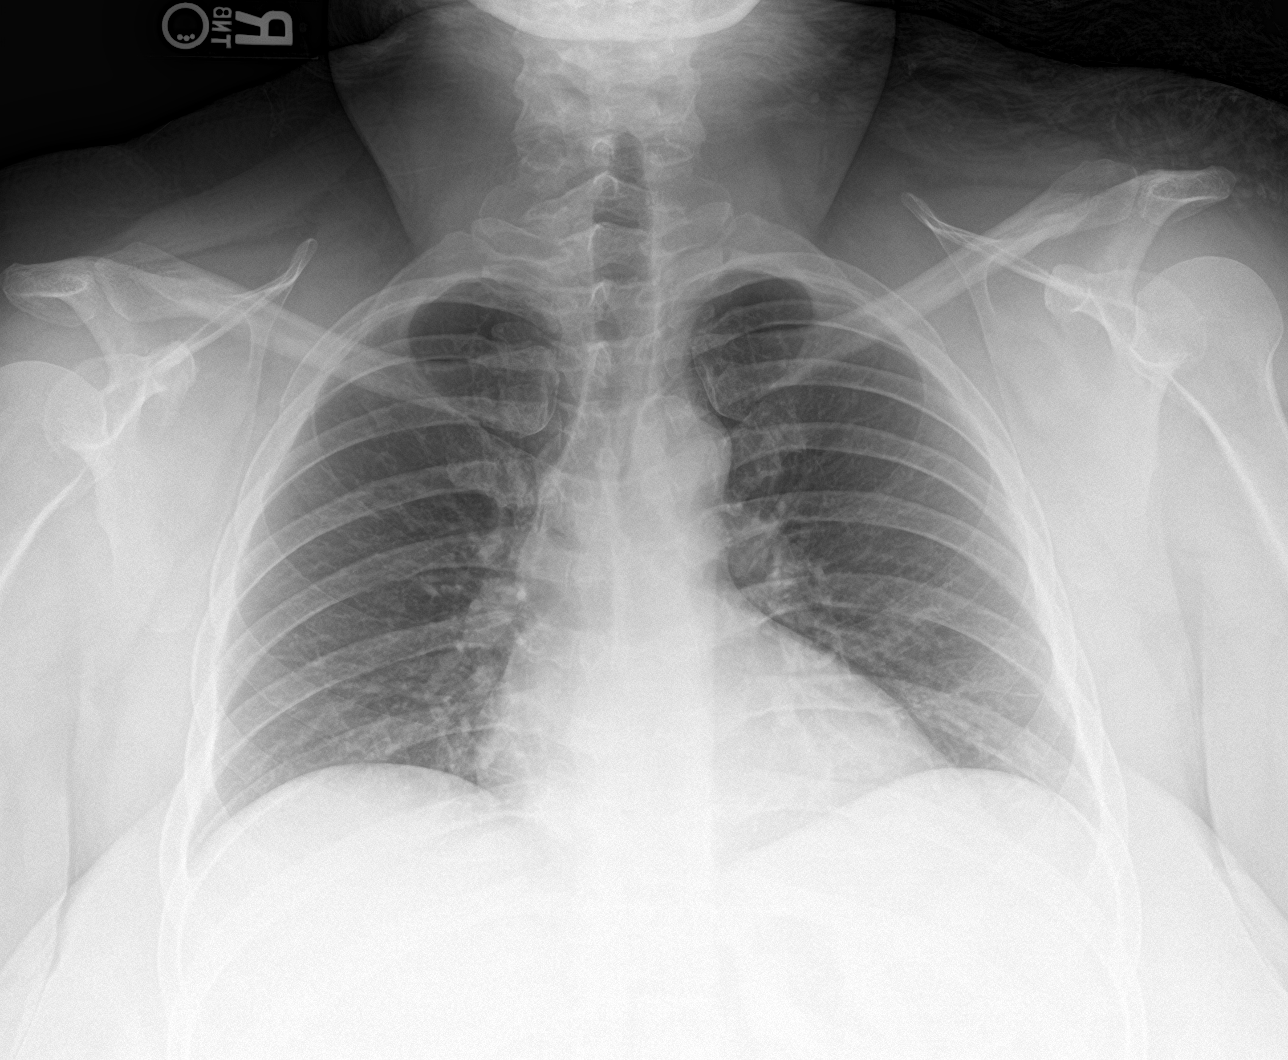

[abdomen kub]
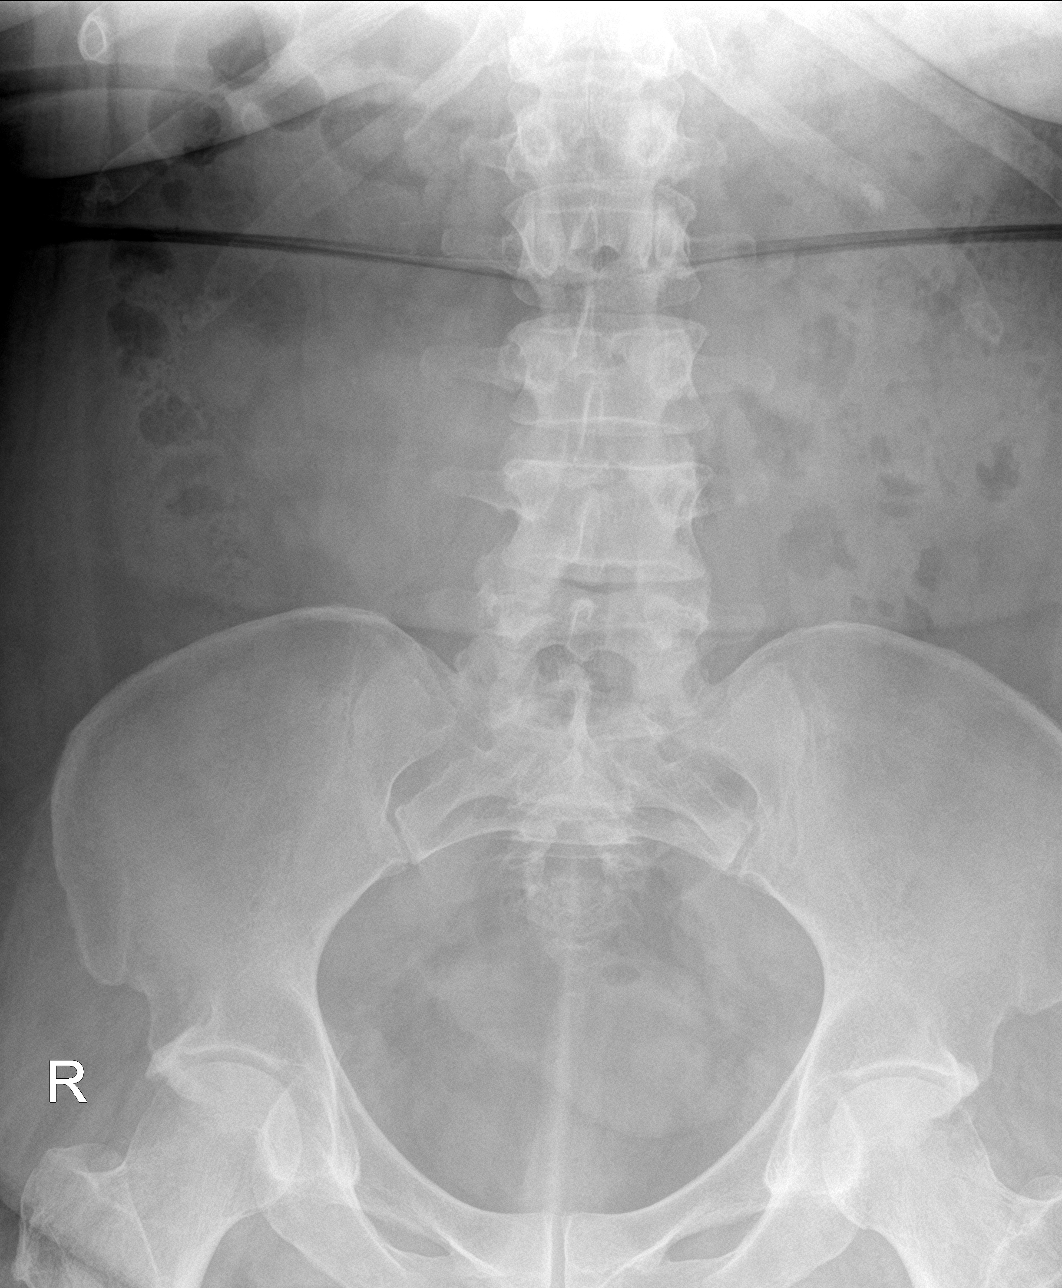

[abdomen erect]
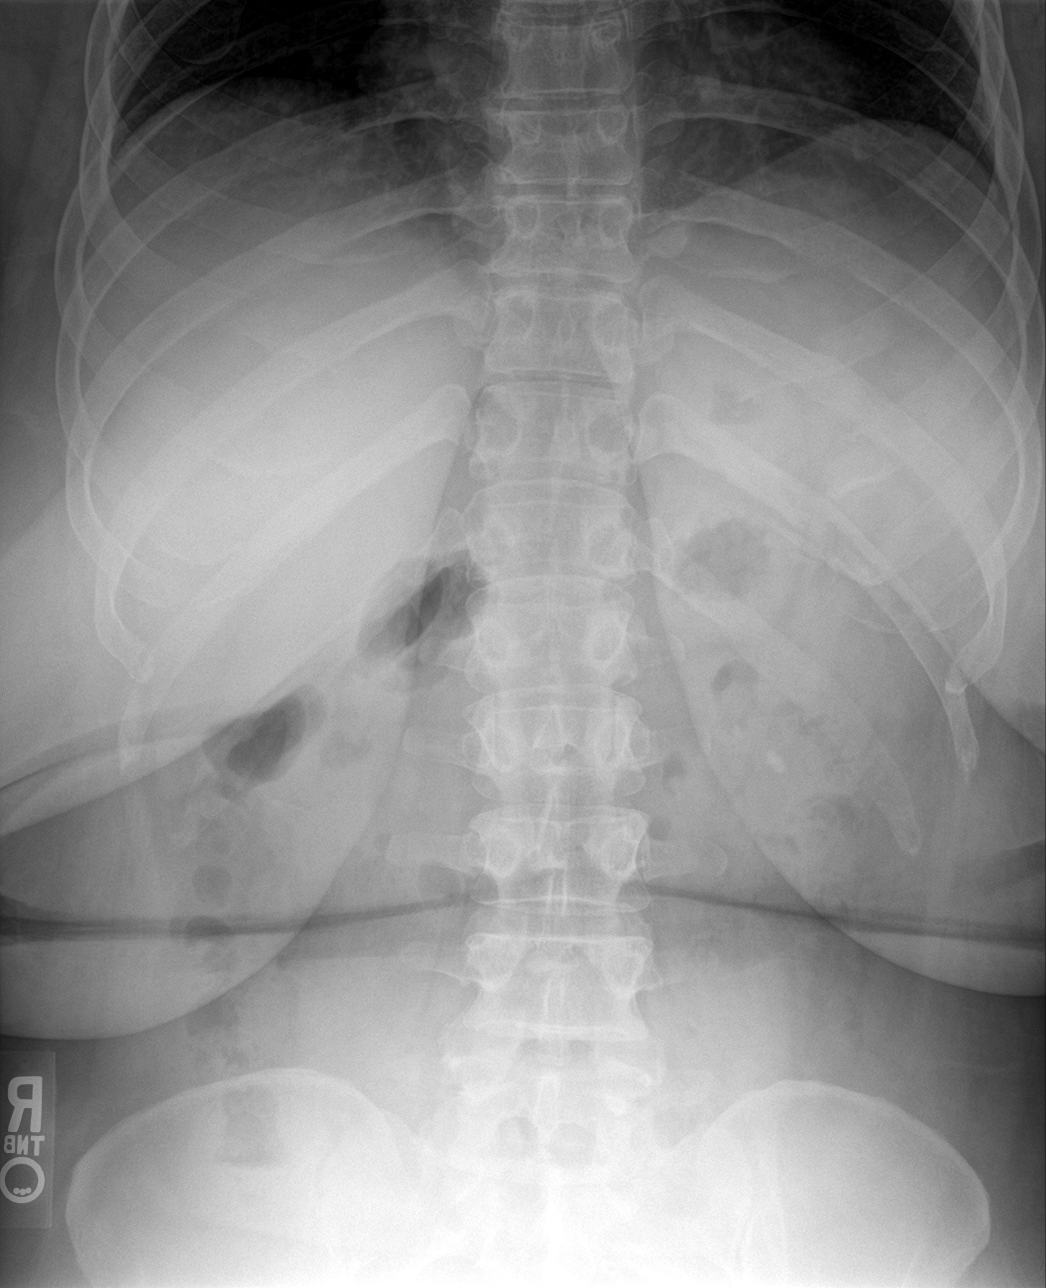

[3 of 3 positions shown; findings below may reference images not displayed]

FINDINGS: Chest:

Cardiomediastinal silhouette within normal limits. No evidence of
central vascular congestion. No interlobular septal thickening. No
pneumothorax or pleural effusion. No confluent airspace disease.

Abdomen:

Gas within stomach small bowel colon. No air-fluid levels. No
abnormal distension. No unexpected radiopaque foreign body. Rounded
calcific density projects over the left renal silhouette. No
unexpected soft tissue density. No displaced fracture
IMPRESSION: Chest:

Negative for acute cardiopulmonary disease.

Abdomen:

Nonobstructive bowel gas pattern.

Plain film demonstration of left nephrolithiasis.

## 2020-11-17 ENCOUNTER — Ambulatory Visit
Admission: EM | Admit: 2020-11-17 | Discharge: 2020-11-17 | Disposition: A | Discharge status: Home / Self Care | Payer: BC Managed Care – PPO | Attending: Internal Medicine | Admitting: Internal Medicine

## 2020-11-17 ENCOUNTER — Other Ambulatory Visit: Payer: Self-pay

## 2020-11-17 DIAGNOSIS — J0100 Acute maxillary sinusitis, unspecified: Secondary | ICD-10-CM

## 2020-11-17 MED ORDER — FLUCONAZOLE 150 MG PO TABS: 150.0000 mg | ORAL_TABLET | Freq: Every day | ORAL | 0 refills | Status: DC

## 2020-11-17 MED ORDER — AZITHROMYCIN 500 MG PO TABS: ORAL_TABLET | ORAL | 0 refills | Status: AC

## 2020-11-17 NOTE — Discharge Instructions (Addendum)
You have been prescribed azithromycin antibiotic to treat upper respiratory infection.  Please follow-up if symptoms persist.

## 2020-11-17 NOTE — ED Provider Notes (Addendum)
[facility]    CSN: [id number] Arrival date & time: 11/17/20  1730      History   Chief Complaint Chief Complaint  Patient presents with   sinus pressure   Otalgia    left    HPI Victoria Booth is a 40 y.o. female.   Patient presents with 1 week history of sinus pressure, nasal congestion, left ear pain.  Patient has been taking Mucinex, Claritin, Zyrtec with minimal improvement in symptoms.  Denies any known fevers or sick contacts.  Denies any chest pain or shortness of breath.  Denies nausea, vomiting, diarrhea, abdominal pain.  Patient also has elevated blood pressure reading in urgent care today.  Patient reports history of hypertension and taking blood pressure medications.  Denies headache, shortness of breath, chest pain, blurred vision, nausea, vomiting, dizziness.  Patient reports that this blood pressure is typical for her.   Otalgia  Past Medical History:  Diagnosis Date   Anemia    Asthma    Crohn disease (HCC)    Hypertension    LUQ abdominal pain 10/16/2017   Microcytic anemia 12/01/2014   Obesity     Patient Active Problem List   Diagnosis Date Noted   Anxiety 03/04/2019   Hypertensive urgency 12/14/2018   Vitamin D deficiency 08/04/2018   Nephrolithiasis 07/14/2018   Functional dyspepsia 02/20/2018   IBS (irritable colon syndrome) 02/20/2018   Iron deficiency anemia 08/18/2017   Acute pain of left shoulder 08/18/2017   Insomnia 07/29/2017   Left lower quadrant abdominal pain of unknown etiology 07/13/2017   OSA (obstructive sleep apnea) 06/05/2017   Major depressive disorder 03/21/2017   Migraine 10/24/2016   Hematuria 09/24/2016   Prediabetes 08/21/2016   Allergic rhinitis 06/18/2016   Left flank pain 06/05/2016   PCOS (polycystic ovarian syndrome) 04/05/2016   Essential hypertension, benign 12/01/2014   Asthma, chronic 12/01/2014   Gastroesophageal reflux disease 12/01/2014   Preventative health care 12/01/2014     Past Surgical History:  Procedure Laterality Date   EXTRACORPOREAL SHOCK WAVE LITHOTRIPSY Left 07/16/2018   Procedure: EXTRACORPOREAL SHOCK WAVE LITHOTRIPSY (ESWL);  Surgeon: Vanna Scotland, MD;  Location: [facility] ORS;  Service: Urology;  Laterality: Left;   TOENAIL EXCISION      OB History   No obstetric history on file.      Home Medications    Prior to Admission medications   Medication Sig Start Date End Date Taking? Authorizing Provider  azithromycin (ZITHROMAX) 500 MG tablet Take 1 tablet (500 mg total) by mouth daily for 1 day, THEN 0.5 tablets (250 mg total) daily for 4 days. 11/17/20 11/22/20 Yes Lance Muss, FNP  fluconazole (DIFLUCAN) 150 MG tablet Take 1 tablet (150 mg total) by mouth daily. 11/17/20  Yes Lance Muss, FNP  acetaminophen (TYLENOL) 500 MG tablet Take 1,000 mg by mouth every 6 (six) hours as needed for headache.    [provider]  albuterol (ACCUNEB) 1.25 MG/3ML nebulizer solution Take 3 mLs (1.25 mg total) by nebulization every 6 (six) hours as needed for wheezing. 02/16/19   Helberg, Jill Alexanders, MD  albuterol (VENTOLIN HFA) 108 (90 Base) MCG/ACT inhaler Inhale 2 puffs into the lungs every 6 (six) hours as needed for wheezing or shortness of breath. 02/16/19   Helberg, Jill Alexanders, MD  amLODIPine-Valsartan-HCTZ 10-160-25 MG TABS Take 10 mg by mouth daily. 06/02/19   Bloomfield, Carley D, DO  Aspirin-Salicylamide-Caffeine (BC HEADACHE POWDER PO) Take 2 packets by mouth daily as needed (severe headache).  [provider]  azelastine (ASTELIN) 0.1 % nasal spray Place 1 spray into both nostrils 2 (two) times daily. Use in each nostril as directed Patient not taking: Reported on 06/03/2019 05/25/19   [name], Gerilyn Pilgrim, PA-C  cetirizine (ZYRTEC ALLERGY) 10 MG tablet Take 1 tablet (10 mg total) by mouth daily. 05/25/19 06/24/19  [name], Gerilyn Pilgrim, PA-C  Cyanocobalamin (VITAMIN B-12 PO) Take 1 tablet by mouth daily.    [provider]   dextromethorphan-guaiFENesin (MUCINEX DM) 30-600 MG 12hr tablet Take 1 tablet by mouth 2 (two) times daily. 07/23/20   Wieters, Hallie C, PA-C  fluticasone (FLONASE) 50 MCG/ACT nasal spray Place 1 spray into both nostrils daily for 14 days. 05/25/19 06/08/19  [name], Gerilyn Pilgrim, PA-C  ondansetron (ZOFRAN ODT) 4 MG disintegrating tablet Take 1 tablet (4 mg total) by mouth every 8 (eight) hours as needed for nausea or vomiting. 06/03/19   Everlena Cooper, Adam R, DO  ondansetron (ZOFRAN) 4 MG tablet Take 1 tablet (4 mg total) by mouth every 8 (eight) hours as needed for nausea or vomiting. 05/25/19   [name], Gerilyn Pilgrim, PA-C  pantoprazole (PROTONIX) 40 MG tablet TAKE 1 TABLET BY MOUTH TWICE A DAY Patient taking differently: Take 40 mg by mouth 2 (two) times daily. 10/06/18   Lorenso Courier, MD  spironolactone (ALDACTONE) 50 MG tablet Take by mouth. 09/03/19   [provider]  SUMAtriptan (IMITREX) 50 MG tablet Take 1 tablet (50 mg total) by mouth once as needed for migraine. May repeat in 2 hours if headache persists or recurs. Patient taking differently: Take 50 mg by mouth See admin instructions. Take one tablet (50 mg) by mouth once as needed for migraine headache, may repeat in 2 hours if headache persists or recurs. 09/22/18 04/09/19  Lorenso Courier, MD  topiramate (TOPAMAX) 50 MG tablet Take 1 tablet (50 mg total) by mouth at bedtime. 06/03/19   Drema Dallas, DO    Family History Family History  Problem Relation Age of Onset   Diabetes Mother    Hypertension Mother    Heart attack Mother        2016- 75 yrs   Breast cancer Maternal Grandmother    Hypertension Father    Colon cancer Neg Hx    Stomach cancer Neg Hx    Pancreatic cancer Neg Hx     Social History Social History   Tobacco Use   Smoking status: Former    Packs/day: 0.10    Types: Cigarettes    Quit date: 04/06/2015    Years since quitting: 5.6   Smokeless tobacco: Never  Vaping Use   Vaping Use: Never used  Substance Use Topics    Alcohol use: No    Alcohol/week: 0.0 standard drinks   Drug use: No     Allergies   Penicillins, Lisinopril, Shellfish allergy, Sulfa antibiotics, Iodine, Keflex [cephalexin], and Latex   Review of Systems Review of Systems Per HPI  Physical Exam Triage Vital Signs ED Triage Vitals  Enc Vitals Group     BP 11/17/20 1801 (!) 165/115     Pulse Rate 11/17/20 1801 78     Resp 11/17/20 1801 18     Temp 11/17/20 1801 98 F (36.7 C)     Temp Source 11/17/20 1801 Oral     SpO2 11/17/20 1801 94 %     Weight --      Height --      Head Circumference --      Peak Flow --  Pain Score 11/17/20 1758 8     Pain Loc --      Pain Edu? --      Excl. in GC? --    No data found.  Updated Vital Signs BP (!) 166/111 (BP Location: Left Arm)   Pulse 78   Temp 98 F (36.7 C) (Oral)   Resp 18   SpO2 94%   Visual Acuity Right Eye Distance:   Left Eye Distance:   Bilateral Distance:    Right Eye Near:   Left Eye Near:    Bilateral Near:     Physical Exam Constitutional:      General: She is not in acute distress.    Appearance: Normal appearance. She is not toxic-appearing or diaphoretic.  HENT:     Head: Normocephalic and atraumatic.     Right Ear: Ear canal normal. A middle ear effusion is present. Tympanic membrane is not erythematous or bulging.     Left Ear: Ear canal normal. A middle ear effusion is present. Tympanic membrane is not erythematous or bulging.     Nose: Congestion present.     Mouth/Throat:     Mouth: Mucous membranes are moist.     Pharynx: No posterior oropharyngeal erythema.  Eyes:     Extraocular Movements: Extraocular movements intact.     Conjunctiva/sclera: Conjunctivae normal.     Pupils: Pupils are equal, round, and reactive to light.  Cardiovascular:     Rate and Rhythm: Normal rate and regular rhythm.     Pulses: Normal pulses.     Heart sounds: Normal heart sounds.  Pulmonary:     Effort: Pulmonary effort is normal. No respiratory  distress.     Breath sounds: Normal breath sounds. No wheezing.  Abdominal:     General: Abdomen is flat. Bowel sounds are normal.     Palpations: Abdomen is soft.  Musculoskeletal:        General: Normal range of motion.     Cervical back: Normal range of motion.  Skin:    General: Skin is warm and dry.  Neurological:     General: No focal deficit present.     Mental Status: She is alert and oriented to person, place, and time. Mental status is at baseline.  Psychiatric:        Mood and Affect: Mood normal.        Behavior: Behavior normal.     UC Treatments / Results  Labs (all labs ordered are listed, but only abnormal results are displayed) Labs Reviewed - No data to display  EKG   Radiology No results found.  Procedures Procedures (including critical care time)  Medications Ordered in UC Medications - No data to display  Initial Impression / Assessment and Plan / UC Course  I have reviewed the triage vital signs and the nursing notes.  Pertinent labs & imaging results that were available during my care of the patient were reviewed by me and considered in my medical decision making (see chart for details).     Will treat acute sinusitis with azithromycin x5 days.  Symptoms have been refractory to over-the-counter medications.  Patient declined COVID-19 testing.  Recommend follow-up as soon as possible for further evaluation and management of high blood pressure for patient.  No signs of hypertensive urgency at this time.  No red flags seen on exam.  Patient requesting Diflucan as it is common for her to get yeast infections with antibiotics.  Will prescribe Diflucan.  Discussed  strict return precautions. Patient verbalized understanding and is agreeable with plan.  Final Clinical Impressions(s) / UC Diagnoses   Final diagnoses:  Acute non-recurrent maxillary sinusitis     Discharge Instructions      You have been prescribed azithromycin antibiotic to treat  upper respiratory infection.  Please follow-up if symptoms persist.     ED Prescriptions     Medication Sig Dispense Auth. Provider   azithromycin (ZITHROMAX) 500 MG tablet Take 1 tablet (500 mg total) by mouth daily for 1 day, THEN 0.5 tablets (250 mg total) daily for 4 days. 3 tablet Lance Muss, FNP   fluconazole (DIFLUCAN) 150 MG tablet Take 1 tablet (150 mg total) by mouth daily. 1 tablet Lance Muss, FNP      PDMP not reviewed this encounter.   Lance Muss, FNP 11/17/20 1832    Lance Muss, FNP 11/17/20 [phone]

## 2020-11-17 NOTE — ED Triage Notes (Signed)
One week h/o sinus pressure, post nasal drip and left ear pain. Has been taking mucinex, Claritin and zyrtec with some relief.

## 2020-12-12 ENCOUNTER — Ambulatory Visit (INDEPENDENT_AMBULATORY_CARE_PROVIDER_SITE_OTHER): Payer: BC Managed Care – PPO

## 2020-12-12 ENCOUNTER — Other Ambulatory Visit: Payer: Self-pay

## 2020-12-12 ENCOUNTER — Ambulatory Visit
Admission: EM | Admit: 2020-12-12 | Discharge: 2020-12-12 | Disposition: A | Discharge status: Home / Self Care | Payer: BC Managed Care – PPO | Attending: Internal Medicine | Admitting: Internal Medicine

## 2020-12-12 DIAGNOSIS — R0602 Shortness of breath: Secondary | ICD-10-CM

## 2020-12-12 DIAGNOSIS — R059 Cough, unspecified: Secondary | ICD-10-CM

## 2020-12-12 DIAGNOSIS — J069 Acute upper respiratory infection, unspecified: Secondary | ICD-10-CM

## 2020-12-12 DIAGNOSIS — R051 Acute cough: Secondary | ICD-10-CM

## 2020-12-12 LAB — POCT INFLUENZA A/B
Influenza A, POC: NEGATIVE
Influenza B, POC: NEGATIVE

## 2020-12-12 MED ORDER — METHYLPREDNISOLONE SODIUM SUCC 125 MG IJ SOLR
125.0000 mg | Freq: Once | INTRAMUSCULAR | Status: AC
  Administered 2020-12-12: 125 mg via INTRAMUSCULAR

## 2020-12-12 MED ORDER — PREDNISONE 20 MG PO TABS: 40.0000 mg | ORAL_TABLET | Freq: Every day | ORAL | 0 refills | Status: AC

## 2020-12-12 NOTE — ED Provider Notes (Addendum)
[facility]    CSN: [id number] Arrival date & time: 12/12/20  1851      History   Chief Complaint Chief Complaint  Patient presents with   Cough   tightness in chest    HPI Victoria Booth is a 40 y.o. female.   Patient presents with nasal congestion, cough, shortness of breath.  Patient reports that she was seen on 11/17/2020 and symptoms have been persistent.  Was prescribed azithromycin at recent visit that she took as prescribed.  Cough is nonproductive.  Patient denies any known fevers.  Patient has also been exposed to the flu so is requesting flu testing.  Patient does have history of asthma and has been taking albuterol nebulizer treatments with no improvement in shortness of breath.  Shortness of breath is intermittent and patient denies chest pain.  Patient has elevated blood pressure reading today that is consistent with recent visits.     Cough  Past Medical History:  Diagnosis Date   Anemia    Asthma    Crohn disease ([place])    Hypertension    LUQ abdominal pain 10/16/2017   Microcytic anemia 12/01/2014   Obesity     Patient Active Problem List   Diagnosis Date Noted   Anxiety 03/04/2019   Hypertensive urgency 12/14/2018   Vitamin D deficiency 08/04/2018   Nephrolithiasis 07/14/2018   Functional dyspepsia 02/20/2018   IBS (irritable colon syndrome) 02/20/2018   Iron deficiency anemia 08/18/2017   Acute pain of left shoulder 08/18/2017   Insomnia 07/29/2017   Left lower quadrant abdominal pain of unknown etiology 07/13/2017   OSA (obstructive sleep apnea) 06/05/2017   Major depressive disorder 03/21/2017   Migraine 10/24/2016   Hematuria 09/24/2016   Prediabetes 08/21/2016   Allergic rhinitis 06/18/2016   Left flank pain 06/05/2016   PCOS (polycystic ovarian syndrome) 04/05/2016   Essential hypertension, benign 12/01/2014   Asthma, chronic 12/01/2014   Gastroesophageal reflux disease 12/01/2014   Preventative health care 12/01/2014     Past Surgical History:  Procedure Laterality Date   EXTRACORPOREAL SHOCK WAVE LITHOTRIPSY Left 07/16/2018   Procedure: EXTRACORPOREAL SHOCK WAVE LITHOTRIPSY (ESWL);  Surgeon: Hollice Espy, MD;  Location: [facility];  Service: Urology;  Laterality: Left;   TOENAIL EXCISION      OB History   No obstetric history on file.      Home Medications    Prior to Admission medications   Medication Sig Start Date End Date Taking? Authorizing Provider  predniSONE (DELTASONE) 20 MG tablet Take 2 tablets (40 mg total) by mouth daily for 5 days. 12/12/20 12/17/20 Yes , Michele Rockers, FNP  acetaminophen (TYLENOL) 500 MG tablet Take 1,000 mg by mouth every 6 (six) hours as needed for headache.    [provider]  albuterol (ACCUNEB) 1.25 MG/3ML nebulizer solution Take 3 mLs (1.25 mg total) by nebulization every 6 (six) hours as needed for wheezing. 02/16/19   Helberg, Larkin Ina, MD  albuterol (VENTOLIN HFA) 108 (90 Base) MCG/ACT inhaler Inhale 2 puffs into the lungs every 6 (six) hours as needed for wheezing or shortness of breath. 02/16/19   Helberg, Larkin Ina, MD  amLODIPine-Valsartan-HCTZ 10-160-25 MG TABS Take 10 mg by mouth daily. 06/02/19   Bloomfield, Carley D, DO  Aspirin-Salicylamide-Caffeine (BC HEADACHE POWDER PO) Take 2 packets by mouth daily as needed (severe headache).    [provider]  azelastine (ASTELIN) 0.1 % nasal spray Place 1 spray into both nostrils 2 (two) times daily. Use in each nostril as  directed Patient not taking: Reported on 06/03/2019 05/25/19   [name], [name], PA-C  cetirizine (ZYRTEC ALLERGY) 10 MG tablet Take 1 tablet (10 mg total) by mouth daily. 05/25/19 06/24/19  [name], [name], PA-C  Cyanocobalamin (VITAMIN B-12 PO) Take 1 tablet by mouth daily.    [provider]  dextromethorphan-guaiFENesin (MUCINEX DM) 30-600 MG 12hr tablet Take 1 tablet by mouth 2 (two) times daily. 07/23/20   Wieters, Hallie C, PA-C  fluconazole (DIFLUCAN) 150 MG tablet Take 1 tablet  (150 mg total) by mouth daily. 11/17/20   [name], FNP  fluticasone (FLONASE) 50 MCG/ACT nasal spray Place 1 spray into both nostrils daily for 14 days. 05/25/19 06/08/19  [name], [name], PA-C  ondansetron (ZOFRAN ODT) 4 MG disintegrating tablet Take 1 tablet (4 mg total) by mouth every 8 (eight) hours as needed for nausea or vomiting. 06/03/19   Tomi Likens, Adam R, DO  ondansetron (ZOFRAN) 4 MG tablet Take 1 tablet (4 mg total) by mouth every 8 (eight) hours as needed for nausea or vomiting. 05/25/19   [name], [name], PA-C  pantoprazole (PROTONIX) 40 MG tablet TAKE 1 TABLET BY MOUTH TWICE A DAY Patient taking differently: Take 40 mg by mouth 2 (two) times daily. 10/06/18   [name], MD  spironolactone (ALDACTONE) 50 MG tablet Take by mouth. 09/03/19   [provider]  SUMAtriptan (IMITREX) 50 MG tablet Take 1 tablet (50 mg total) by mouth once as needed for migraine. May repeat in 2 hours if headache persists or recurs. Patient taking differently: Take 50 mg by mouth See admin instructions. Take one tablet (50 mg) by mouth once as needed for migraine headache, may repeat in 2 hours if headache persists or recurs. 09/22/18 04/09/19  [name], MD  topiramate (TOPAMAX) 50 MG tablet Take 1 tablet (50 mg total) by mouth at bedtime. 06/03/19   Pieter Partridge, DO    Family History Family History  Problem Relation Age of Onset   Diabetes Mother    Hypertension Mother    Heart attack Mother        2016- 2 yrs   Breast cancer Maternal Grandmother    Hypertension Father    Colon cancer Neg Hx    Stomach cancer Neg Hx    Pancreatic cancer Neg Hx     Social History Social History   Tobacco Use   Smoking status: Former    Packs/day: 0.10    Types: Cigarettes    Quit date: 04/06/2015    Years since quitting: 5.6   Smokeless tobacco: Never  Vaping Use   Vaping Use: Never used  Substance Use Topics   Alcohol use: No    Alcohol/week: 0.0 standard drinks   Drug use: No     Allergies    Penicillins, Lisinopril, Shellfish allergy, Sulfa antibiotics, Iodine, Keflex [cephalexin], and Latex   Review of Systems Review of Systems Per HPI  Physical Exam Triage Vital Signs ED Triage Vitals  Enc Vitals Group     BP 12/12/20 1934 (!) 164/105     Pulse Rate 12/12/20 1934 92     Resp 12/12/20 1934 18     Temp 12/12/20 1934 99.4 F (37.4 C)     Temp Source 12/12/20 1934 Oral     SpO2 12/12/20 1934 94 %     Weight --      Height --      Head Circumference --      Peak Flow --  Pain Score 12/12/20 1937 8     Pain Loc --      Pain Edu? --      Excl. in GC? --    No data found.  Updated Vital Signs BP (!) 164/105 (BP Location: Left Arm) Comment: BP meds taken this morning  Pulse 92   Temp 99.4 F (37.4 C) (Oral)   Resp 18   SpO2 94%   Visual Acuity Right Eye Distance:   Left Eye Distance:   Bilateral Distance:    Right Eye Near:   Left Eye Near:    Bilateral Near:     Physical Exam Constitutional:      General: She is not in acute distress.    Appearance: Normal appearance. She is not toxic-appearing or diaphoretic.  HENT:     Head: Normocephalic and atraumatic.     Right Ear: Tympanic membrane and ear canal normal.     Left Ear: Tympanic membrane and ear canal normal.     Nose: Congestion present.     Mouth/Throat:     Mouth: Mucous membranes are moist.     Pharynx: No posterior oropharyngeal erythema.  Eyes:     Extraocular Movements: Extraocular movements intact.     Conjunctiva/sclera: Conjunctivae normal.     Pupils: Pupils are equal, round, and reactive to light.  Cardiovascular:     Rate and Rhythm: Normal rate and regular rhythm.     Pulses: Normal pulses.     Heart sounds: Normal heart sounds.  Pulmonary:     Effort: Pulmonary effort is normal. No respiratory distress.     Breath sounds: No stridor. Wheezing and rhonchi present. No rales.  Abdominal:     General: Abdomen is flat. Bowel sounds are normal.     Palpations: Abdomen is  soft.  Musculoskeletal:        General: Normal range of motion.     Cervical back: Normal range of motion.  Skin:    General: Skin is warm and dry.  Neurological:     General: No focal deficit present.     Mental Status: She is alert and oriented to person, place, and time. Mental status is at baseline.  Psychiatric:        Mood and Affect: Mood normal.        Behavior: Behavior normal.     UC Treatments / Results  Labs (all labs ordered are listed, but only abnormal results are displayed) Labs Reviewed  POCT INFLUENZA A/B    EKG   Radiology DG Chest 2 View  Result Date: 12/12/2020 CLINICAL DATA:  Cough. EXAM: CHEST - 2 VIEW COMPARISON:  07/26/2020. FINDINGS: Cardiac silhouette is normal in size and configuration. No mediastinal or masses. No evidence of adenopathy. Clear lungs.  No pleural effusion or pneumothorax. Skeletal structures are intact. IMPRESSION: No active cardiopulmonary disease. Electronically Signed   By: Amie Portlandavid  Ormond M.D.   On: 12/12/2020 20:11    Procedures Procedures (including critical care time)  Medications Ordered in UC Medications  methylPREDNISolone sodium succinate (SOLU-MEDROL) 125 mg/2 mL injection 125 mg (125 mg Intramuscular Given 12/12/20 2022)    Initial Impression / Assessment and Plan / UC Course  I have reviewed the triage vital signs and the nursing notes.  Pertinent labs & imaging results that were available during my care of the patient were reviewed by me and considered in my medical decision making (see chart for details).     Chest x-ray was negative for any acute  cardiopulmonary process.  Symptoms seem viral in etiology.  Suspect asthma exacerbation related to acute illness.  Rapid flu test was negative.  Do not think that COVID-19 testing is necessary given recent negative results and duration of symptoms. Patient unable to pick up prednisone steroid tonight given time of visit.  Will give Solu-Medrol 125 mg today in urgent care.   Advised patient to start taking prednisone pills tomorrow.  No signs of hypertensive urgency on exam.  Blood pressure is consistent with other blood pressures.  Patient to follow-up with PCP for further evaluation and management of blood pressure.  No signs of respiratory distress on exam and oxygen is normal.  Discussed strict return precautions.  Patient verbalized understanding and was agreeable with plan.  Final Clinical Impressions(s) / UC Diagnoses   Final diagnoses:  Acute upper respiratory infection  Acute cough  Shortness of breath     Discharge Instructions      Your chest today was normal.  Rapid flu test was negative.  You were given a steroid injection in urgent care today to help with shortness of breath.  Please start taking prednisone pills tomorrow.     ED Prescriptions     Medication Sig Dispense Auth. Provider   predniSONE (DELTASONE) 20 MG tablet Take 2 tablets (40 mg total) by mouth daily for 5 days. 10 tablet Gustavus Bryant, [place]      PDMP not reviewed this encounter.   Gustavus Bryant, [place] 12/12/20 2026    Gustavus Bryant, [place] 12/12/20 2027

## 2020-12-12 NOTE — Discharge Instructions (Signed)
Your chest today was normal.  Rapid flu test was negative.  You were given a steroid injection in urgent care today to help with shortness of breath.  Please start taking prednisone pills tomorrow.

## 2020-12-12 NOTE — ED Triage Notes (Signed)
Pt was last seen on 10/7 with similar sxs. Pt reports that she felt somewhat better until approx a week ago when sxs gradually returned. She notes sxs have significantly worsened within the last four days, c/o chest tightness.  Has been doing breathing tx and OTC cough meds w/o relief.

## 2021-03-31 ENCOUNTER — Other Ambulatory Visit: Payer: Self-pay

## 2021-03-31 ENCOUNTER — Ambulatory Visit
Admission: EM | Admit: 2021-03-31 | Discharge: 2021-03-31 | Disposition: A | Discharge status: Home / Self Care | Payer: Managed Care, Other (non HMO)

## 2021-03-31 ENCOUNTER — Encounter: Payer: Self-pay | Admitting: Emergency Medicine

## 2021-03-31 DIAGNOSIS — N760 Acute vaginitis: Secondary | ICD-10-CM

## 2021-03-31 MED ORDER — FLUCONAZOLE 150 MG PO TABS: 150.0000 mg | ORAL_TABLET | Freq: Every day | ORAL | 0 refills | Status: AC

## 2021-03-31 NOTE — ED Triage Notes (Signed)
Patient c/o possible yeast infection after getting brazilian wax.  Patient did exfoliated and noticed irritation and burning.  Patient has tried OTC Monistat w/o relief.

## 2021-03-31 NOTE — ED Provider Notes (Signed)
[facility]    CSN: [id number] Arrival date & time: 03/31/21  1411      History   Chief Complaint Chief Complaint  Patient presents with   Vaginitis    HPI Victoria Booth is a 41 y.o. female.   Patient here today for evaluation of possible yeast infection. She reports vaginal itching and irritation after getting wax. She has tried [employer] but this has not been helpful. Her symptoms are similar to prior cases of yeast vaginitis. She denies any risk for STD as she has not been sexually active.   The history is provided by the patient.   Past Medical History:  Diagnosis Date   Anemia    Asthma    Crohn disease ([place])    Hypertension    LUQ abdominal pain 10/16/2017   Microcytic anemia 12/01/2014   Obesity     Patient Active Problem List   Diagnosis Date Noted   Anxiety 03/04/2019   Hypertensive urgency 12/14/2018   Vitamin D deficiency 08/04/2018   Nephrolithiasis 07/14/2018   Functional dyspepsia 02/20/2018   IBS (irritable colon syndrome) 02/20/2018   Iron deficiency anemia 08/18/2017   Acute pain of left shoulder 08/18/2017   Insomnia 07/29/2017   Left lower quadrant abdominal pain of unknown etiology 07/13/2017   OSA (obstructive sleep apnea) 06/05/2017   Major depressive disorder 03/21/2017   Migraine 10/24/2016   Hematuria 09/24/2016   Prediabetes 08/21/2016   Allergic rhinitis 06/18/2016   Left flank pain 06/05/2016   PCOS (polycystic ovarian syndrome) 04/05/2016   Essential hypertension, benign 12/01/2014   Asthma, chronic 12/01/2014   Gastroesophageal reflux disease 12/01/2014   Preventative health care 12/01/2014    Past Surgical History:  Procedure Laterality Date   EXTRACORPOREAL SHOCK WAVE LITHOTRIPSY Left 07/16/2018   Procedure: EXTRACORPOREAL SHOCK WAVE LITHOTRIPSY (ESWL);  Surgeon: Hollice Espy, MD;  Location: [facility] ORS;  Service: Urology;  Laterality: Left;   TOENAIL EXCISION      OB History   No obstetric history on  file.      Home Medications    Prior to Admission medications   Medication Sig Start Date End Date Taking? Authorizing Provider  atenolol-chlorthalidone (TENORETIC) 50-25 MG tablet Take 1 tablet by mouth daily. 03/29/21  Yes [provider]  fluconazole (DIFLUCAN) 150 MG tablet Take 1 tablet (150 mg total) by mouth daily for 1 day. 03/31/21 04/01/21 Yes Francene Finders, PA-C  acetaminophen (TYLENOL) 500 MG tablet Take 1,000 mg by mouth every 6 (six) hours as needed for headache.    [provider]  albuterol (ACCUNEB) 1.25 MG/3ML nebulizer solution Take 3 mLs (1.25 mg total) by nebulization every 6 (six) hours as needed for wheezing. 02/16/19   Helberg, Larkin Ina, MD  albuterol (VENTOLIN HFA) 108 (90 Base) MCG/ACT inhaler Inhale 2 puffs into the lungs every 6 (six) hours as needed for wheezing or shortness of breath. 02/16/19   Helberg, Larkin Ina, MD  amLODIPine-Valsartan-HCTZ 10-160-25 MG TABS Take 10 mg by mouth daily. 06/02/19   Bloomfield, Carley D, DO  Aspirin-Salicylamide-Caffeine (BC HEADACHE POWDER PO) Take 2 packets by mouth daily as needed (severe headache).    [provider]  azelastine (ASTELIN) 0.1 % nasal spray Place 1 spray into both nostrils 2 (two) times daily. Use in each nostril as directed Patient not taking: Reported on 06/03/2019 05/25/19   [name], [name], PA-C  cetirizine (ZYRTEC ALLERGY) 10 MG tablet Take 1 tablet (10 mg total) by mouth daily. 05/25/19 06/24/19  [name], [name], PA-C  Cyanocobalamin (VITAMIN B-12 PO) Take 1 tablet by mouth daily.    [provider]  dextromethorphan-guaiFENesin (MUCINEX DM) 30-600 MG 12hr tablet Take 1 tablet by mouth 2 (two) times daily. 07/23/20   Wieters, Hallie C, PA-C  fluticasone (FLONASE) 50 MCG/ACT nasal spray Place 1 spray into both nostrils daily for 14 days. 05/25/19 06/08/19  [name], [name], PA-C  ondansetron (ZOFRAN ODT) 4 MG disintegrating tablet Take 1 tablet (4 mg total) by mouth every 8 (eight) hours as needed for  nausea or vomiting. 06/03/19   Tomi Likens, Adam R, DO  ondansetron (ZOFRAN) 4 MG tablet Take 1 tablet (4 mg total) by mouth every 8 (eight) hours as needed for nausea or vomiting. 05/25/19   [name], [name], PA-C  pantoprazole (PROTONIX) 40 MG tablet TAKE 1 TABLET BY MOUTH TWICE A DAY Patient taking differently: Take 40 mg by mouth 2 (two) times daily. 10/06/18   [name], MD  spironolactone (ALDACTONE) 50 MG tablet Take by mouth. 09/03/19   [provider]  SUMAtriptan (IMITREX) 50 MG tablet Take 1 tablet (50 mg total) by mouth once as needed for migraine. May repeat in 2 hours if headache persists or recurs. Patient taking differently: Take 50 mg by mouth See admin instructions. Take one tablet (50 mg) by mouth once as needed for migraine headache, may repeat in 2 hours if headache persists or recurs. 09/22/18 04/09/19  [name], MD  topiramate (TOPAMAX) 50 MG tablet Take 1 tablet (50 mg total) by mouth at bedtime. 06/03/19   Pieter Partridge, DO    Family History Family History  Problem Relation Age of Onset   Diabetes Mother    Hypertension Mother    Heart attack Mother        2016- 35 yrs   Breast cancer Maternal Grandmother    Hypertension Father    Colon cancer Neg Hx    Stomach cancer Neg Hx    Pancreatic cancer Neg Hx     Social History Social History   Tobacco Use   Smoking status: Former    Packs/day: 0.10    Types: Cigarettes    Quit date: 04/06/2015    Years since quitting: 5.9   Smokeless tobacco: Never  Vaping Use   Vaping Use: Never used  Substance Use Topics   Alcohol use: No    Alcohol/week: 0.0 standard drinks   Drug use: No     Allergies   Penicillins, Lisinopril, Shellfish allergy, Sulfa antibiotics, Iodine, Keflex [cephalexin], and Latex   Review of Systems Review of Systems  Constitutional:  Negative for chills and fever.  Eyes:  Negative for discharge and redness.  Gastrointestinal:  Negative for abdominal pain, nausea and vomiting.   Genitourinary:  Positive for vaginal discharge.    Physical Exam Triage Vital Signs ED Triage Vitals  Enc Vitals Group     BP 03/31/21 1440 (!) 158/90     Pulse Rate 03/31/21 1440 91     Resp --      Temp 03/31/21 1440 97.9 F (36.6 C)     Temp Source 03/31/21 1440 Oral     SpO2 03/31/21 1440 96 %     Weight 03/31/21 1441 267 lb (121.1 kg)     Height 03/31/21 1441 5\' 7"  (1.702 m)     Head Circumference --      Peak Flow --      Pain Score 03/31/21 1440 10     Pain Loc --      Pain  Edu? --      Excl. in [place]? --    No data found.  Updated Vital Signs BP (!) 158/90 (BP Location: Left Arm)    Pulse 91    Temp 97.9 F (36.6 C) (Oral)    Ht 5\' 7"  (1.702 m)    Wt 267 lb (121.1 kg)    SpO2 96%    BMI 41.82 kg/m      Physical Exam Vitals and nursing note reviewed.  Constitutional:      General: She is not in acute distress.    Appearance: Normal appearance. She is not ill-appearing.  HENT:     Head: Normocephalic and atraumatic.  Eyes:     Conjunctiva/sclera: Conjunctivae normal.  Cardiovascular:     Rate and Rhythm: Normal rate.  Pulmonary:     Effort: Pulmonary effort is normal.  Neurological:     Mental Status: She is alert.  Psychiatric:        Mood and Affect: Mood normal.        Behavior: Behavior normal.        Thought Content: Thought content normal.     UC Treatments / Results  Labs (all labs ordered are listed, but only abnormal results are displayed) Labs Reviewed - No data to display  EKG   Radiology No results found.  Procedures Procedures (including critical care time)  Medications Ordered in UC Medications - No data to display  Initial Impression / Assessment and Plan / UC Course  I have reviewed the triage vital signs and the nursing notes.  Pertinent labs & imaging results that were available during my care of the patient were reviewed by me and considered in my medical decision making (see chart for details).    Will treat to cover  yeast vaginitis. Patient deferred other STD screening. Recommended follow up with any further concerns.   Final Clinical Impressions(s) / UC Diagnoses   Final diagnoses:  Acute vaginitis   Discharge Instructions   None    ED Prescriptions     Medication Sig Dispense Auth. Provider   fluconazole (DIFLUCAN) 150 MG tablet Take 1 tablet (150 mg total) by mouth daily for 1 day. 1 tablet Francene Finders, PA-C      PDMP not reviewed this encounter.   Francene Finders, PA-C 03/31/21 1526

## 2021-05-11 ENCOUNTER — Telehealth: Payer: Self-pay | Admitting: Physician Assistant

## 2021-05-11 NOTE — Telephone Encounter (Signed)
Scheduled appt per 3/31 referral. Pt is aware of appt date and time. Pt is aware to arrive 15 mins prior to appt time and to bring and updated insurance card. Pt is aware of appt location.   ?

## 2021-05-28 ENCOUNTER — Telehealth: Payer: Self-pay | Admitting: Internal Medicine

## 2021-05-28 NOTE — Telephone Encounter (Signed)
R/s new hem appt per pt request. Pt is aware of new appt date and time.  ?

## 2021-05-29 ENCOUNTER — Inpatient Hospital Stay: Payer: Managed Care, Other (non HMO)

## 2021-05-29 ENCOUNTER — Inpatient Hospital Stay: Payer: Managed Care, Other (non HMO) | Admitting: Physician Assistant

## 2021-06-11 ENCOUNTER — Other Ambulatory Visit: Payer: Self-pay

## 2021-06-11 DIAGNOSIS — D509 Iron deficiency anemia, unspecified: Secondary | ICD-10-CM

## 2021-06-12 ENCOUNTER — Encounter: Payer: Self-pay | Admitting: Internal Medicine

## 2021-06-12 ENCOUNTER — Inpatient Hospital Stay: Payer: Managed Care, Other (non HMO)

## 2021-06-12 ENCOUNTER — Other Ambulatory Visit: Payer: Self-pay

## 2021-06-12 ENCOUNTER — Inpatient Hospital Stay: Payer: Managed Care, Other (non HMO) | Attending: Physician Assistant | Admitting: Internal Medicine

## 2021-06-12 VITALS — BP 150/88 | HR 11 | Temp 96.6°F | Resp 18 | Wt 324.6 lb

## 2021-06-12 DIAGNOSIS — D509 Iron deficiency anemia, unspecified: Secondary | ICD-10-CM | POA: Insufficient documentation

## 2021-06-12 DIAGNOSIS — K5090 Crohn's disease, unspecified, without complications: Secondary | ICD-10-CM | POA: Diagnosis not present

## 2021-06-12 DIAGNOSIS — D500 Iron deficiency anemia secondary to blood loss (chronic): Secondary | ICD-10-CM | POA: Insufficient documentation

## 2021-06-12 LAB — CBC WITH DIFFERENTIAL (CANCER CENTER ONLY)
Abs Immature Granulocytes: 0.09 10*3/uL — ABNORMAL HIGH (ref 0.00–0.07)
Basophils Absolute: 0.1 10*3/uL (ref 0.0–0.1)
Basophils Relative: 1 %
Eosinophils Absolute: 0.2 10*3/uL (ref 0.0–0.5)
Eosinophils Relative: 3 %
HCT: 34.7 % — ABNORMAL LOW (ref 36.0–46.0)
Hemoglobin: 10.3 g/dL — ABNORMAL LOW (ref 12.0–15.0)
Immature Granulocytes: 1 %
Lymphocytes Relative: 40 %
Lymphs Abs: 3.4 10*3/uL (ref 0.7–4.0)
MCH: 21.6 pg — ABNORMAL LOW (ref 26.0–34.0)
MCHC: 29.7 g/dL — ABNORMAL LOW (ref 30.0–36.0)
MCV: 72.7 fL — ABNORMAL LOW (ref 80.0–100.0)
Monocytes Absolute: 0.4 10*3/uL (ref 0.1–1.0)
Monocytes Relative: 5 %
Neutro Abs: 4.3 10*3/uL (ref 1.7–7.7)
Neutrophils Relative %: 50 %
Platelet Count: 312 10*3/uL (ref 150–400)
RBC: 4.77 MIL/uL (ref 3.87–5.11)
RDW: 17.4 % — ABNORMAL HIGH (ref 11.5–15.5)
WBC Count: 8.5 10*3/uL (ref 4.0–10.5)
nRBC: 0 % (ref 0.0–0.2)

## 2021-06-12 LAB — CMP (CANCER CENTER ONLY)
ALT: 17 U/L (ref 0–44)
AST: 18 U/L (ref 15–41)
Albumin: 3.7 g/dL (ref 3.5–5.0)
Alkaline Phosphatase: 96 U/L (ref 38–126)
Anion gap: 5 (ref 5–15)
BUN: 9 mg/dL (ref 6–20)
CO2: 25 mmol/L (ref 22–32)
Calcium: 8.9 mg/dL (ref 8.9–10.3)
Chloride: 109 mmol/L (ref 98–111)
Creatinine: 0.65 mg/dL (ref 0.44–1.00)
GFR, Estimated: >60 mL/min (ref >60)
Glucose, Bld: 159 mg/dL — ABNORMAL HIGH (ref 70–99)
Potassium: 3.6 mmol/L (ref 3.5–5.1)
Sodium: 139 mmol/L (ref 135–145)
Total Bilirubin: 0.3 mg/dL (ref 0.3–1.2)
Total Protein: 7.3 g/dL (ref 6.5–8.1)

## 2021-06-12 LAB — IRON AND IRON BINDING CAPACITY (CC-WL,HP ONLY)
Iron: 24 ug/dL — ABNORMAL LOW (ref 28–170)
Saturation Ratios: 8 % — ABNORMAL LOW (ref 10.4–31.8)
TIBC: 314 ug/dL (ref 250–450)
UIBC: 290 ug/dL (ref 148–442)

## 2021-06-12 LAB — FERRITIN: Ferritin: 119 ng/mL (ref 11–307)

## 2021-06-12 NOTE — Progress Notes (Signed)
? ? [facility] ?[phone]   [phone] ? ?CONSULT NOTE ? ?REFERRING PHYSICIAN: Raelyn Number, PA-C ? ?REASON FOR CONSULTATION:  ?41 years old African-American female with persistent anemia ? ?HPI ?Victoria Booth is a 41 y.o. female with past medical history significant for hypertension, PCOS, asthma, GERD, prediabetes as well as history of Crohn's disease.  The patient mentions that she has anemia for several years and she was followed closely by her primary care provider.  She was tried on oral iron tablet in the past but she has intolerance to this treatment because of frequent constipation and she was unable to take it more than once a week.  She also does not tolerate the oral iron tablets because of her Crohn's disease.  She received iron infusion in the past at [facility] outpatient facility.  She has been complaining of increasing craving for ice and starch.  She also has more dizzy spells and wake up from sleep feeling tired with some heart palpitation and shortness of breath with minimal exertion.  She was found on her recent visit by her primary care physician to have persistent anemia.  CBC on May 07, 2021 showed hemoglobin of 11.1 and hematocrit 37.4 with normal total white blood count of 10.3 and normal platelets count of 338,000.  The patient had low serum iron of 24 with iron saturation of 8% and TIBC of 304.  Her maternal grandmother had sickle cell disease but she does not know about her mother. ?Review of systems today she continues to complain of the fatigue and weakness but no significant weight loss or night sweats.  She has occasional headache with no visual changes.  She has intermittent nausea and vomiting as well as constipation but no abdominal pain or diarrhea.  She has no chest pain but has shortness of breath with exertion with no cough or hemoptysis. ?Family history significant for mother and father with hypertension.  Maternal  grandmother had sickle cell disease and sister had anemia. ?The patient is single and has no children.  She works from home for [employer].  She has a history of smoking for few years but quit 8 years ago.  She has no history of alcohol or drug abuse. ? ?HPI ? ?Past Medical History:  ?Diagnosis Date  ? Anemia   ? Asthma   ? Crohn disease ([place])   ? Hypertension   ? LUQ abdominal pain 10/16/2017  ? Microcytic anemia 12/01/2014  ? Obesity   ? ? ?Past Surgical History:  ?Procedure Laterality Date  ? EXTRACORPOREAL SHOCK WAVE LITHOTRIPSY Left 07/16/2018  ? Procedure: EXTRACORPOREAL SHOCK WAVE LITHOTRIPSY (ESWL);  Surgeon: Hollice Espy, MD;  Location: [facility] ORS;  Service: Urology;  Laterality: Left;  ? TOENAIL EXCISION    ? ? ?Family History  ?Problem Relation Age of Onset  ? Diabetes Mother   ? Hypertension Mother   ? Heart attack Mother   ?     2016- 52 yrs  ? Breast cancer Maternal Grandmother   ? Hypertension Father   ? Colon cancer Neg Hx   ? Stomach cancer Neg Hx   ? Pancreatic cancer Neg Hx   ? ? ?Social History ?Social History  ? ?Tobacco Use  ? Smoking status: Former  ?  Packs/day: 0.10  ?  Types: Cigarettes  ?  Quit date: 04/06/2015  ?  Years since quitting: 6.1  ? Smokeless tobacco: Never  ?Vaping Use  ? Vaping Use: Never used  ?Substance  Use Topics  ? Alcohol use: No  ?  Alcohol/week: 0.0 standard drinks  ? Drug use: No  ? ? ?Allergies  ?Allergen Reactions  ? Penicillins Shortness Of Breath, Rash and Other (See Comments)  ?  Has patient had a PCN reaction causing immediate rash, facial/tongue/throat swelling, SOB or lightheadedness with hypotension:yes ?Has patient had a PCN reaction causing severe rash involving mucus membranes or skin necrosis: no ?Has patient had a PCN reaction that required hospitalization : no ?Has patient had a PCN reaction occurring within the last 10 years:no  ?If all of the above answers are "NO", then may proceed with Cephalosporin use. ?  ? Lisinopril Cough  ? Shellfish Allergy Swelling   ? Sulfa Antibiotics Hives  ? Iodine Rash  ? Keflex [Cephalexin] Itching and Other (See Comments)  ?  Itching hands and legs  ? Latex Rash  ? ? ?Current Outpatient Medications  ?Medication Sig Dispense Refill  ? acetaminophen (TYLENOL) 500 MG tablet Take 1,000 mg by mouth every 6 (six) hours as needed for headache.    ? albuterol (ACCUNEB) 1.25 MG/3ML nebulizer solution Take 3 mLs (1.25 mg total) by nebulization every 6 (six) hours as needed for wheezing. 75 mL 12  ? albuterol (VENTOLIN HFA) 108 (90 Base) MCG/ACT inhaler Inhale 2 puffs into the lungs every 6 (six) hours as needed for wheezing or shortness of breath. 6.7 g 11  ? amLODIPine-Valsartan-HCTZ 10-160-25 MG TABS Take 10 mg by mouth daily. 30 tablet 1  ? Aspirin-Salicylamide-Caffeine (BC HEADACHE POWDER PO) Take 2 packets by mouth daily as needed (severe headache).    ? atenolol-chlorthalidone (TENORETIC) 50-25 MG tablet Take 1 tablet by mouth daily.    ? azelastine (ASTELIN) 0.1 % nasal spray Place 1 spray into both nostrils 2 (two) times daily. Use in each nostril as directed (Patient not taking: Reported on 06/03/2019) 30 mL 0  ? cetirizine (ZYRTEC ALLERGY) 10 MG tablet Take 1 tablet (10 mg total) by mouth daily. 30 tablet 0  ? Cyanocobalamin (VITAMIN B-12 PO) Take 1 tablet by mouth daily.    ? dextromethorphan-guaiFENesin (MUCINEX DM) 30-600 MG 12hr tablet Take 1 tablet by mouth 2 (two) times daily. 15 tablet 0  ? fluticasone (FLONASE) 50 MCG/ACT nasal spray Place 1 spray into both nostrils daily for 14 days. 1 g 0  ? ondansetron (ZOFRAN ODT) 4 MG disintegrating tablet Take 1 tablet (4 mg total) by mouth every 8 (eight) hours as needed for nausea or vomiting. 20 tablet 3  ? ondansetron (ZOFRAN) 4 MG tablet Take 1 tablet (4 mg total) by mouth every 8 (eight) hours as needed for nausea or vomiting. 4 tablet 0  ? pantoprazole (PROTONIX) 40 MG tablet TAKE 1 TABLET BY MOUTH TWICE A DAY (Patient taking differently: Take 40 mg by mouth 2 (two) times daily.) 180  tablet 0  ? spironolactone (ALDACTONE) 50 MG tablet Take by mouth.    ? SUMAtriptan (IMITREX) 50 MG tablet Take 1 tablet (50 mg total) by mouth once as needed for migraine. May repeat in 2 hours if headache persists or recurs. (Patient taking differently: Take 50 mg by mouth See admin instructions. Take one tablet (50 mg) by mouth once as needed for migraine headache, may repeat in 2 hours if headache persists or recurs.) 30 tablet 0  ? topiramate (TOPAMAX) 50 MG tablet Take 1 tablet (50 mg total) by mouth at bedtime. 30 tablet 3  ? ?No current facility-administered medications for this visit.  ? ? ?Review  of Systems ? ?Constitutional: positive for fatigue ?Eyes: negative ?Ears, nose, mouth, throat, and face: negative ?Respiratory: positive for dyspnea on exertion ?Cardiovascular: negative ?Gastrointestinal: positive for constipation, nausea, and vomiting ?Genitourinary:negative ?Integument/breast: negative ?Hematologic/lymphatic: negative ?Musculoskeletal:negative ?Neurological: negative ?Behavioral/Psych: negative ?Endocrine: negative ?Allergic/Immunologic: negative ? ?Physical Exam ? ?[id number], healthy, no distress, well nourished, well developed, and obese ?SKIN: skin color, texture, turgor are normal, no rashes or significant lesions ?HEAD: Normocephalic, No masses, lesions, tenderness or abnormalities ?EYES: normal, PERRLA, Conjunctiva are pink and non-injected ?EARS: External ears normal, Canals clear ?OROPHARYNX:no exudate, no erythema, and lips, buccal mucosa, and tongue normal  ?NECK: supple, no adenopathy, no JVD ?LYMPH:  no palpable lymphadenopathy, no hepatosplenomegaly ?BREAST:not examined ?LUNGS: clear to auscultation , and palpation ?HEART: regular rate & rhythm, no murmurs, and no gallops ?ABDOMEN:abdomen soft, non-tender, obese, normal bowel sounds, and no masses or organomegaly ?BACK: Back symmetric, no curvature., No CVA tenderness ?EXTREMITIES:no joint deformities, effusion, or inflammation, no  edema  ?NEURO: alert & oriented x 3 with fluent speech, no focal motor/sensory deficits ? ?PERFORMANCE STATUS: ECOG 1 ? ?LABORATORY DATA: ?Lab Results  ?Component Value Date  ? WBC 11.8 (H) 07/26/2020  ? HGB 1

## 2021-06-19 ENCOUNTER — Ambulatory Visit (INDEPENDENT_AMBULATORY_CARE_PROVIDER_SITE_OTHER): Payer: Managed Care, Other (non HMO)

## 2021-06-19 VITALS — BP 123/79 | HR 73 | Temp 97.9°F | Resp 18 | Ht 67.0 in | Wt 328.4 lb

## 2021-06-19 DIAGNOSIS — D500 Iron deficiency anemia secondary to blood loss (chronic): Secondary | ICD-10-CM

## 2021-06-19 MED ORDER — SODIUM CHLORIDE 0.9 % IV SOLN
300.0000 mg | INTRAVENOUS | Status: DC
  Administered 2021-06-19: 300 mg via INTRAVENOUS
  Filled 2021-06-19: qty 15

## 2021-06-19 NOTE — Progress Notes (Signed)
Diagnosis: Iron Deficiency Anemia ? ?Provider:  Chilton Greathouse, MD ? ?Procedure: Infusion ? ?IV Type: Peripheral, IV Location: R Hand ? ?Venofer (Iron Sucrose), Dose: 300 mg ? ?Infusion Start Time: [phone] ? ?Infusion Stop Time: 1102 ? ?Post Infusion IV Care: Peripheral IV Discontinued ? ?Discharge: Condition: Good, Destination: Home . AVS provided to patient.  ? ?Performed by:  Loney Hering, LPN  ?  ?

## 2021-06-26 ENCOUNTER — Ambulatory Visit (INDEPENDENT_AMBULATORY_CARE_PROVIDER_SITE_OTHER): Payer: Managed Care, Other (non HMO)

## 2021-06-26 VITALS — BP 116/81 | HR 70 | Temp 97.6°F | Resp 18 | Ht 67.0 in | Wt 322.0 lb

## 2021-06-26 DIAGNOSIS — D500 Iron deficiency anemia secondary to blood loss (chronic): Secondary | ICD-10-CM | POA: Diagnosis not present

## 2021-06-26 MED ORDER — SODIUM CHLORIDE 0.9 % IV SOLN
300.0000 mg | INTRAVENOUS | Status: DC
  Administered 2021-06-26: 300 mg via INTRAVENOUS
  Filled 2021-06-26: qty 15

## 2021-06-26 NOTE — Progress Notes (Signed)
Diagnosis: Iron Deficiency Anemia ? ?Provider:  Chilton Greathouse, MD ? ?Procedure: Infusion ? ?IV Type: Peripheral, IV Location: R Hand ? ?Venofer (Iron Sucrose), Dose: 300 mg ? ?Infusion Start Time: 0902  am ? ?Infusion Stop Time: 1044 am ? ?Post Infusion IV Care: Observation period completed and Peripheral IV Discontinued ? ?Discharge: Condition: Good, Destination: Home . AVS provided to patient.  ? ?Performed by:  Loney Hering, LPN  ?  ?

## 2021-07-03 ENCOUNTER — Ambulatory Visit: Payer: Managed Care, Other (non HMO)

## 2021-07-06 ENCOUNTER — Ambulatory Visit: Payer: Managed Care, Other (non HMO)

## 2021-07-06 MED ORDER — SODIUM CHLORIDE 0.9 % IV SOLN
300.0000 mg | INTRAVENOUS | Status: DC
  Filled 2021-07-06: qty 15

## 2021-07-10 ENCOUNTER — Encounter: Payer: Self-pay | Admitting: Internal Medicine

## 2021-07-10 ENCOUNTER — Ambulatory Visit (INDEPENDENT_AMBULATORY_CARE_PROVIDER_SITE_OTHER): Payer: Managed Care, Other (non HMO)

## 2021-07-10 ENCOUNTER — Ambulatory Visit: Payer: Managed Care, Other (non HMO)

## 2021-07-10 VITALS — BP 106/70 | HR 69 | Temp 97.5°F | Resp 18 | Ht 67.0 in | Wt 321.6 lb

## 2021-07-10 DIAGNOSIS — D500 Iron deficiency anemia secondary to blood loss (chronic): Secondary | ICD-10-CM

## 2021-07-10 MED ORDER — SODIUM CHLORIDE 0.9 % IV SOLN
300.0000 mg | Freq: Once | INTRAVENOUS | Status: AC
  Administered 2021-07-10: 300 mg via INTRAVENOUS
  Filled 2021-07-10: qty 15

## 2021-07-10 NOTE — Progress Notes (Signed)
Diagnosis: Iron Deficiency Anemia  Provider:  Marshell Garfinkel, MD  Procedure: Infusion  IV Type: Peripheral, IV Location: R Hand  Venofer (Iron Sucrose), Dose: 300 mg  Infusion Start Time: [id number]  Infusion Stop Time: 1240  Post Infusion IV Care: Peripheral IV Discontinued  Discharge: Condition: Good, Destination: Home . AVS provided to patient.   Performed by:  Adelina Mings, LPN

## 2021-07-25 ENCOUNTER — Ambulatory Visit
Admission: RE | Admit: 2021-07-25 | Discharge: 2021-07-25 | Disposition: A | Discharge status: Home / Self Care | Payer: Managed Care, Other (non HMO) | Source: Ambulatory Visit | Attending: Family Medicine | Admitting: Family Medicine

## 2021-07-25 VITALS — BP 123/79 | HR 113 | Temp 98.0°F | Resp 18

## 2021-07-25 DIAGNOSIS — J0191 Acute recurrent sinusitis, unspecified: Secondary | ICD-10-CM | POA: Diagnosis not present

## 2021-07-25 MED ORDER — TRIAMCINOLONE ACETONIDE 40 MG/ML IJ SUSP: 40.0000 mg | Freq: Once | INTRAMUSCULAR | Status: DC

## 2021-07-25 MED ORDER — AZITHROMYCIN 250 MG PO TABS: ORAL_TABLET | ORAL | 0 refills | Status: DC

## 2021-07-25 NOTE — Discharge Instructions (Addendum)
You are given a shot of triamcinolone 40 mg here in the office  Azithromycin 250 mg--take 2 tablets the first day, then take 1 tablet daily for 4 more days.

## 2021-07-25 NOTE — ED Provider Notes (Signed)
[facility]    CSN: [id number] Arrival date & time: 07/25/21  1501      History   Chief Complaint Chief Complaint  Patient presents with   Facial Pain    HPI Victoria Booth is a 41 y.o. female.   HPI Here for a 2-week history of nasal congestion and a little cough and postnasal drainage.  In the last 3 to 4 days she has begun having some more facial pressure in her throat is of been a little more sore.  No fever or chills.  Her asthma is not bothering her more lately    Past Medical History:  Diagnosis Date   Anemia    Asthma    Crohn disease (HCC)    Hypertension    LUQ abdominal pain 10/16/2017   Microcytic anemia 12/01/2014   Obesity     Patient Active Problem List   Diagnosis Date Noted   Iron deficiency anemia due to chronic blood loss 06/12/2021   Anxiety 03/04/2019   Hypertensive urgency 12/14/2018   Vitamin D deficiency 08/04/2018   Nephrolithiasis 07/14/2018   Functional dyspepsia 02/20/2018   IBS (irritable colon syndrome) 02/20/2018   Iron deficiency anemia 08/18/2017   Acute pain of left shoulder 08/18/2017   Insomnia 07/29/2017   Left lower quadrant abdominal pain of unknown etiology 07/13/2017   OSA (obstructive sleep apnea) 06/05/2017   Major depressive disorder 03/21/2017   Migraine 10/24/2016   Hematuria 09/24/2016   Prediabetes 08/21/2016   Allergic rhinitis 06/18/2016   Left flank pain 06/05/2016   PCOS (polycystic ovarian syndrome) 04/05/2016   Essential hypertension, benign 12/01/2014   Asthma, chronic 12/01/2014   Gastroesophageal reflux disease 12/01/2014   Preventative health care 12/01/2014    Past Surgical History:  Procedure Laterality Date   EXTRACORPOREAL SHOCK WAVE LITHOTRIPSY Left 07/16/2018   Procedure: EXTRACORPOREAL SHOCK WAVE LITHOTRIPSY (ESWL);  Surgeon: Vanna Scotland, MD;  Location: [facility];  Service: Urology;  Laterality: Left;   TOENAIL EXCISION      OB History   No obstetric history on  file.      Home Medications    Prior to Admission medications   Medication Sig Start Date End Date Taking? Authorizing Provider  azithromycin (ZITHROMAX) 250 MG tablet Take first 2 tablets together, then 1 every day until finished. 07/25/21  Yes Quantel Mcinturff, Janace Aris, MD  acetaminophen (TYLENOL) 500 MG tablet Take 1,000 mg by mouth every 6 (six) hours as needed for headache. Patient not taking: Reported on 06/12/2021    [provider]  albuterol (ACCUNEB) 1.25 MG/3ML nebulizer solution Take 3 mLs (1.25 mg total) by nebulization every 6 (six) hours as needed for wheezing. 02/16/19   Helberg, Jill Alexanders, MD  albuterol (VENTOLIN HFA) 108 (90 Base) MCG/ACT inhaler Inhale 2 puffs into the lungs every 6 (six) hours as needed for wheezing or shortness of breath. 02/16/19   Levora Dredge, MD  atenolol-chlorthalidone (TENORETIC) 50-25 MG tablet Take 1 tablet by mouth daily. 03/29/21   [provider]  Cyanocobalamin (VITAMIN B-12 PO) Take 1 tablet by mouth daily.    [provider]  fluticasone (FLONASE) 50 MCG/ACT nasal spray Place 1 spray into both nostrils daily for 14 days. 05/25/19 06/08/19  [name], Gerilyn Pilgrim, PA-C  ibuprofen (ADVIL) 800 MG tablet Take 800 mg by mouth 3 (three) times daily. 06/11/21   [provider]  spironolactone (ALDACTONE) 50 MG tablet Take by mouth. 09/03/19   [provider]  SUMAtriptan (IMITREX) 50 MG tablet Take 1  tablet (50 mg total) by mouth once as needed for migraine. May repeat in 2 hours if headache persists or recurs. Patient taking differently: Take 50 mg by mouth See admin instructions. Take one tablet (50 mg) by mouth once as needed for migraine headache, may repeat in 2 hours if headache persists or recurs. 09/22/18 04/09/19  Lorenso Courier, MD    Family History Family History  Problem Relation Age of Onset   Diabetes Mother    Hypertension Mother    Heart attack Mother        2016- 90 yrs   Breast cancer Maternal Grandmother     Hypertension Father    Colon cancer Neg Hx    Stomach cancer Neg Hx    Pancreatic cancer Neg Hx     Social History Social History   Tobacco Use   Smoking status: Former    Packs/day: 0.10    Types: Cigarettes    Quit date: 04/06/2015    Years since quitting: 6.3   Smokeless tobacco: Never  Vaping Use   Vaping Use: Never used  Substance Use Topics   Alcohol use: No    Alcohol/week: 0.0 standard drinks of alcohol   Drug use: No     Allergies   Penicillins, Lisinopril, Shellfish allergy, Sulfa antibiotics, Iodine, Keflex [cephalexin], and Latex   Review of Systems Review of Systems   Physical Exam Triage Vital Signs ED Triage Vitals [07/25/21 1523]  Enc Vitals Group     BP 123/79     Pulse Rate (!) 113     Resp 18     Temp 98 F (36.7 C)     Temp Source Oral     SpO2 98 %     Weight      Height      Head Circumference      Peak Flow      Pain Score 0     Pain Loc      Pain Edu?      Excl. in [place]?    No data found.  Updated Vital Signs BP 123/79 (BP Location: Right Arm)   Pulse (!) 113   Temp 98 F (36.7 C) (Oral)   Resp 18   SpO2 98%   Visual Acuity Right Eye Distance:   Left Eye Distance:   Bilateral Distance:    Right Eye Near:   Left Eye Near:    Bilateral Near:     Physical Exam Vitals reviewed.  Constitutional:      General: She is not in acute distress.    Appearance: She is not toxic-appearing.  HENT:     Right Ear: Tympanic membrane and ear canal normal.     Left Ear: Tympanic membrane and ear canal normal.     Nose: Nose normal.     Mouth/Throat:     Mouth: Mucous membranes are moist.     Comments: There is some clear mucus draining; no erythema Eyes:     Extraocular Movements: Extraocular movements intact.     Conjunctiva/sclera: Conjunctivae normal.     Pupils: Pupils are equal, round, and reactive to light.  Cardiovascular:     Rate and Rhythm: Normal rate and regular rhythm.     Heart sounds: No murmur  heard. Pulmonary:     Effort: Pulmonary effort is normal. No respiratory distress.     Breath sounds: No stridor. No wheezing, rhonchi or rales.  Musculoskeletal:     Cervical back: Neck supple.  Lymphadenopathy:  Cervical: No cervical adenopathy.  Skin:    Capillary Refill: Capillary refill takes less than 2 seconds.     Coloration: Skin is not jaundiced or pale.  Neurological:     General: No focal deficit present.     Mental Status: She is alert and oriented to person, place, and time.  Psychiatric:        Behavior: Behavior normal.      UC Treatments / Results  Labs (all labs ordered are listed, but only abnormal results are displayed) Labs Reviewed - No data to display  EKG   Radiology No results found.  Procedures Procedures (including critical care time)  Medications Ordered in UC Medications  triamcinolone acetonide (KENALOG-40) injection 40 mg (has no administration in time range)    Initial Impression / Assessment and Plan / UC Course  I have reviewed the triage vital signs and the nursing notes.  Pertinent labs & imaging results that were available during my care of the patient were reviewed by me and considered in my medical decision making (see chart for details).     With the length of her symptoms and the double sickening, I will treat with the Z-Pak antibiotic.  She is allergic to sulfa, penicillin, and cephalexin.  She is already on Flonase, so we will also do a steroid injection Final Clinical Impressions(s) / UC Diagnoses   Final diagnoses:  Acute recurrent sinusitis, unspecified location     Discharge Instructions      You are given a shot of triamcinolone 40 mg here in the office  Azithromycin 250 mg--take 2 tablets the first day, then take 1 tablet daily for 4 more days.       ED Prescriptions     Medication Sig Dispense Auth. Provider   azithromycin (ZITHROMAX) 250 MG tablet Take first 2 tablets together, then 1 every day  until finished. 6 tablet Marlinda Mike Janace Aris, MD      PDMP not reviewed this encounter.   Zenia Resides, MD 07/25/21 1550

## 2021-07-25 NOTE — ED Triage Notes (Signed)
Pt c/o nasal congestion and facial congestion w/ nasal draining.

## 2021-08-13 ENCOUNTER — Inpatient Hospital Stay: Payer: Managed Care, Other (non HMO) | Admitting: Internal Medicine

## 2021-08-13 ENCOUNTER — Inpatient Hospital Stay: Payer: Managed Care, Other (non HMO) | Attending: Physician Assistant

## 2021-08-15 ENCOUNTER — Ambulatory Visit: Payer: Managed Care, Other (non HMO) | Admitting: Internal Medicine

## 2021-08-15 ENCOUNTER — Other Ambulatory Visit: Payer: Managed Care, Other (non HMO)

## 2021-10-05 DIAGNOSIS — N911 Secondary amenorrhea: Secondary | ICD-10-CM | POA: Insufficient documentation

## 2021-10-18 ENCOUNTER — Ambulatory Visit
Admission: EM | Admit: 2021-10-18 | Discharge: 2021-10-18 | Disposition: A | Discharge status: Home / Self Care | Payer: Managed Care, Other (non HMO) | Attending: Physician Assistant | Admitting: Physician Assistant

## 2021-10-18 ENCOUNTER — Encounter: Payer: Self-pay | Admitting: Emergency Medicine

## 2021-10-18 DIAGNOSIS — M545 Low back pain, unspecified: Secondary | ICD-10-CM | POA: Diagnosis not present

## 2021-10-18 LAB — POCT URINALYSIS DIP (MANUAL ENTRY)
Bilirubin, UA: NEGATIVE
Blood, UA: NEGATIVE
Glucose, UA: NEGATIVE mg/dL
Ketones, POC UA: NEGATIVE mg/dL
Leukocytes, UA: NEGATIVE
Nitrite, UA: NEGATIVE
Protein Ur, POC: NEGATIVE mg/dL
Spec Grav, UA: 1.025 (ref 1.010–1.025)
Urobilinogen, UA: 0.2 E.U./dL
pH, UA: 6 (ref 5.0–8.0)

## 2021-10-18 MED ORDER — PREDNISONE 20 MG PO TABS: 40.0000 mg | ORAL_TABLET | Freq: Every day | ORAL | 0 refills | Status: AC

## 2021-10-18 NOTE — ED Provider Notes (Addendum)
[facility]    CSN: [id number] Arrival date & time: 10/18/21  1447      History   Chief Complaint Chief Complaint  Patient presents with   Back Pain    HPI Victoria Booth is a 41 y.o. female.   Patient here today for evaluation of right low back pain that started about 4 days ago.  She reports that she has seen her primary care provider for same and was prescribed muscle relaxer but notes she has not been taking that medication because she does not like to take medications that "control" her.  She has been taking ibuprofen with out significant relief.  She denies any numbness or tingling.  Movement makes pain worse.  She denies any known injury or fall.  The history is provided by the patient.  Back Pain Associated symptoms: no abdominal pain and no fever     Past Medical History:  Diagnosis Date   Anemia    Asthma    Crohn disease ([place])    Hypertension    LUQ abdominal pain 10/16/2017   Microcytic anemia 12/01/2014   Obesity     Patient Active Problem List   Diagnosis Date Noted   Iron deficiency anemia due to chronic blood loss 06/12/2021   Anxiety 03/04/2019   Hypertensive urgency 12/14/2018   Vitamin D deficiency 08/04/2018   Nephrolithiasis 07/14/2018   Functional dyspepsia 02/20/2018   IBS (irritable colon syndrome) 02/20/2018   Iron deficiency anemia 08/18/2017   Acute pain of left shoulder 08/18/2017   Insomnia 07/29/2017   Left lower quadrant abdominal pain of unknown etiology 07/13/2017   OSA (obstructive sleep apnea) 06/05/2017   Major depressive disorder 03/21/2017   Migraine 10/24/2016   Hematuria 09/24/2016   Prediabetes 08/21/2016   Allergic rhinitis 06/18/2016   Left flank pain 06/05/2016   PCOS (polycystic ovarian syndrome) 04/05/2016   Essential hypertension, benign 12/01/2014   Asthma, chronic 12/01/2014   Gastroesophageal reflux disease 12/01/2014   Preventative health care 12/01/2014    Past Surgical History:   Procedure Laterality Date   EXTRACORPOREAL SHOCK WAVE LITHOTRIPSY Left 07/16/2018   Procedure: EXTRACORPOREAL SHOCK WAVE LITHOTRIPSY (ESWL);  Surgeon: Hollice Espy, MD;  Location: [facility];  Service: Urology;  Laterality: Left;   TOENAIL EXCISION      OB History   No obstetric history on file.      Home Medications    Prior to Admission medications   Medication Sig Start Date End Date Taking? Authorizing Provider  predniSONE (DELTASONE) 20 MG tablet Take 2 tablets (40 mg total) by mouth daily with breakfast for 5 days. 10/18/21 10/23/21 Yes Francene Finders, PA-C  acetaminophen (TYLENOL) 500 MG tablet Take 1,000 mg by mouth every 6 (six) hours as needed for headache. Patient not taking: Reported on 06/12/2021    [provider]  albuterol (ACCUNEB) 1.25 MG/3ML nebulizer solution Take 3 mLs (1.25 mg total) by nebulization every 6 (six) hours as needed for wheezing. 02/16/19   Helberg, Larkin Ina, MD  albuterol (VENTOLIN HFA) 108 (90 Base) MCG/ACT inhaler Inhale 2 puffs into the lungs every 6 (six) hours as needed for wheezing or shortness of breath. 02/16/19   [name], MD  atenolol-chlorthalidone (TENORETIC) 50-25 MG tablet Take 1 tablet by mouth daily. 03/29/21   [provider]  azithromycin (ZITHROMAX) 250 MG tablet Take first 2 tablets together, then 1 every day until finished. 07/25/21   Barrett Henle, MD  Cyanocobalamin (VITAMIN B-12 PO) Take 1 tablet by  mouth daily.    [provider]  fluticasone (FLONASE) 50 MCG/ACT nasal spray Place 1 spray into both nostrils daily for 14 days. 05/25/19 06/08/19  [name], [name], PA-C  ibuprofen (ADVIL) 800 MG tablet Take 800 mg by mouth 3 (three) times daily. 06/11/21   [provider]  spironolactone (ALDACTONE) 50 MG tablet Take by mouth. 09/03/19   [provider]  SUMAtriptan (IMITREX) 50 MG tablet Take 1 tablet (50 mg total) by mouth once as needed for migraine. May repeat in 2 hours if headache persists  or recurs. Patient taking differently: Take 50 mg by mouth See admin instructions. Take one tablet (50 mg) by mouth once as needed for migraine headache, may repeat in 2 hours if headache persists or recurs. 09/22/18 04/09/19  [name], MD    Family History Family History  Problem Relation Age of Onset   Diabetes Mother    Hypertension Mother    Heart attack Mother        2016- 68 yrs   Breast cancer Maternal Grandmother    Hypertension Father    Colon cancer Neg Hx    Stomach cancer Neg Hx    Pancreatic cancer Neg Hx     Social History Social History   Tobacco Use   Smoking status: Former    Packs/day: 0.10    Types: Cigarettes    Quit date: 04/06/2015    Years since quitting: 6.5   Smokeless tobacco: Never  Vaping Use   Vaping Use: Never used  Substance Use Topics   Alcohol use: No    Alcohol/week: 0.0 standard drinks of alcohol   Drug use: No     Allergies   Penicillins, Lisinopril, Shellfish allergy, Sulfa antibiotics, Iodine, Keflex [cephalexin], and Latex   Review of Systems Review of Systems  Constitutional:  Negative for chills and fever.  Eyes:  Negative for discharge and redness.  Gastrointestinal:  Negative for abdominal pain, nausea and vomiting.  Musculoskeletal:  Positive for back pain and myalgias.     Physical Exam Triage Vital Signs ED Triage Vitals  Enc Vitals Group     BP 10/18/21 1546 (!) 147/103     Pulse Rate 10/18/21 1546 60     Resp 10/18/21 1546 18     Temp 10/18/21 1546 97.7 F (36.5 C)     Temp src --      SpO2 10/18/21 1546 96 %     Weight --      Height --      Head Circumference --      Peak Flow --      Pain Score 10/18/21 1545 9     Pain Loc --      Pain Edu? --      Excl. in [place]? --    No data found.  Updated Vital Signs BP (!) 147/103   Pulse 60   Temp 97.7 F (36.5 C)   Resp 18   SpO2 96%      Physical Exam Vitals and nursing note reviewed.  Constitutional:      General: She is not in acute  distress.    Appearance: Normal appearance. She is not ill-appearing.  HENT:     Head: Normocephalic and atraumatic.  Eyes:     Conjunctiva/sclera: Conjunctivae normal.  Cardiovascular:     Rate and Rhythm: Normal rate.  Pulmonary:     Effort: Pulmonary effort is normal.  Musculoskeletal:     Comments: No tenderness to palpation noted  to thoracic or lumbar spine.  Some tenderness palpation noted to right lower back diffusely.  Neurological:     Mental Status: She is alert.  Psychiatric:        Mood and Affect: Mood normal.        Behavior: Behavior normal.        Thought Content: Thought content normal.      UC Treatments / Results  Labs (all labs ordered are listed, but only abnormal results are displayed) Labs Reviewed  POCT URINALYSIS DIP (MANUAL ENTRY)    EKG   Radiology No results found.  Procedures Procedures (including critical care time)  Medications Ordered in UC Medications - No data to display  Initial Impression / Assessment and Plan / UC Course  I have reviewed the triage vital signs and the nursing notes.  Pertinent labs & imaging results that were available during my care of the patient were reviewed by me and considered in my medical decision making (see chart for details).    UA without concerning findings. Steroid burst prescribed to cover suspected muscular strain.  Recommended follow-up with PCP if no gradual improvement or with any further concerns that she may need referral to PT.  Final Clinical Impressions(s) / UC Diagnoses   Final diagnoses:  Acute right-sided low back pain without sciatica   Discharge Instructions   None    ED Prescriptions     Medication Sig Dispense Auth. Provider   predniSONE (DELTASONE) 20 MG tablet Take 2 tablets (40 mg total) by mouth daily with breakfast for 5 days. 10 tablet Francene Finders, PA-C      PDMP not reviewed this encounter.   Francene Finders, PA-C 10/18/21 1659    Francene Finders,  PA-C 10/18/21 1700

## 2021-10-18 NOTE — ED Triage Notes (Signed)
Pt is present today with lower back. Pt sx started Sunday

## 2021-10-22 ENCOUNTER — Ambulatory Visit: Payer: Managed Care, Other (non HMO) | Admitting: Dietician

## 2021-11-08 ENCOUNTER — Encounter: Payer: Self-pay | Admitting: Dietician

## 2021-11-08 ENCOUNTER — Encounter: Payer: Managed Care, Other (non HMO) | Attending: Physician Assistant | Admitting: Dietician

## 2021-11-08 ENCOUNTER — Ambulatory Visit
Admission: RE | Admit: 2021-11-08 | Discharge: 2021-11-08 | Disposition: A | Discharge status: Home / Self Care | Payer: Managed Care, Other (non HMO) | Source: Ambulatory Visit | Attending: Internal Medicine | Admitting: Internal Medicine

## 2021-11-08 ENCOUNTER — Ambulatory Visit: Payer: Managed Care, Other (non HMO)

## 2021-11-08 VITALS — BP 125/76 | HR 70 | Temp 98.1°F | Resp 18

## 2021-11-08 DIAGNOSIS — R0602 Shortness of breath: Secondary | ICD-10-CM

## 2021-11-08 DIAGNOSIS — E119 Type 2 diabetes mellitus without complications: Secondary | ICD-10-CM | POA: Diagnosis present

## 2021-11-08 DIAGNOSIS — J029 Acute pharyngitis, unspecified: Secondary | ICD-10-CM | POA: Insufficient documentation

## 2021-11-08 DIAGNOSIS — J4521 Mild intermittent asthma with (acute) exacerbation: Secondary | ICD-10-CM | POA: Diagnosis not present

## 2021-11-08 DIAGNOSIS — R059 Cough, unspecified: Secondary | ICD-10-CM

## 2021-11-08 DIAGNOSIS — R053 Chronic cough: Secondary | ICD-10-CM | POA: Insufficient documentation

## 2021-11-08 DIAGNOSIS — J069 Acute upper respiratory infection, unspecified: Secondary | ICD-10-CM | POA: Diagnosis not present

## 2021-11-08 LAB — POCT RAPID STREP A (OFFICE): Rapid Strep A Screen: NEGATIVE

## 2021-11-08 MED ORDER — ALBUTEROL SULFATE (2.5 MG/3ML) 0.083% IN NEBU: 2.5000 mg | INHALATION_SOLUTION | Freq: Four times a day (QID) | RESPIRATORY_TRACT | 12 refills | Status: DC | PRN

## 2021-11-08 MED ORDER — PREDNISONE 20 MG PO TABS: 40.0000 mg | ORAL_TABLET | Freq: Every day | ORAL | 0 refills | Status: AC

## 2021-11-08 MED ORDER — IPRATROPIUM-ALBUTEROL 0.5-2.5 (3) MG/3ML IN SOLN
3.0000 mL | Freq: Once | RESPIRATORY_TRACT | Status: AC
  Administered 2021-11-08: 3 mL via RESPIRATORY_TRACT

## 2021-11-08 MED ORDER — AZITHROMYCIN 250 MG PO TABS: ORAL_TABLET | ORAL | 0 refills | Status: AC

## 2021-11-08 NOTE — ED Provider Notes (Signed)
[facility]    CSN: [id number] Arrival date & time: 11/08/21  1105      History   Chief Complaint Chief Complaint  Patient presents with   Cough   Nasal Congestion   Shortness of Breath   Sore Throat   Otalgia    HPI Victoria Booth is a 41 y.o. female.   Patient presents with cough, nasal congestion, sore throat, shortness of breath.  Patient reports the cough and nasal congestion has been present for about 11 days.  She developed new onset right ear pain and sore throat about 2 to 3 days ago.  Denies any known sick contacts.  Has not been seen by healthcare provider since symptoms started.  Denies any known fevers at home.  Shortness of breath is intermittent with wheezing.  Patient does have a history of asthma and has been using albuterol inhaler with no improvement in symptoms.  Denies chest pain, nausea, vomiting, diarrhea, abdominal pain.  Patient has also taken Zyrtec and Mucinex with minimal improvement.   Cough Shortness of Breath Sore Throat  Otalgia   Past Medical History:  Diagnosis Date   Anemia    Asthma    Crohn disease ([place])    Hypertension    LUQ abdominal pain 10/16/2017   Microcytic anemia 12/01/2014   Obesity     Patient Active Problem List   Diagnosis Date Noted   Iron deficiency anemia due to chronic blood loss 06/12/2021   Anxiety 03/04/2019   Hypertensive urgency 12/14/2018   Vitamin D deficiency 08/04/2018   Nephrolithiasis 07/14/2018   Functional dyspepsia 02/20/2018   IBS (irritable colon syndrome) 02/20/2018   Iron deficiency anemia 08/18/2017   Acute pain of left shoulder 08/18/2017   Insomnia 07/29/2017   Left lower quadrant abdominal pain of unknown etiology 07/13/2017   OSA (obstructive sleep apnea) 06/05/2017   Major depressive disorder 03/21/2017   Migraine 10/24/2016   Hematuria 09/24/2016   Prediabetes 08/21/2016   Allergic rhinitis 06/18/2016   Left flank pain 06/05/2016   PCOS (polycystic ovarian  syndrome) 04/05/2016   Essential hypertension, benign 12/01/2014   Asthma, chronic 12/01/2014   Gastroesophageal reflux disease 12/01/2014   Preventative health care 12/01/2014    Past Surgical History:  Procedure Laterality Date   EXTRACORPOREAL SHOCK WAVE LITHOTRIPSY Left 07/16/2018   Procedure: EXTRACORPOREAL SHOCK WAVE LITHOTRIPSY (ESWL);  Surgeon: Hollice Espy, MD;  Location: [facility] ORS;  Service: Urology;  Laterality: Left;   TOENAIL EXCISION      OB History   No obstetric history on file.      Home Medications    Prior to Admission medications   Medication Sig Start Date End Date Taking? Authorizing Provider  albuterol (PROVENTIL) (2.5 MG/3ML) 0.083% nebulizer solution Take 3 mLs (2.5 mg total) by nebulization every 6 (six) hours as needed for wheezing or shortness of breath. 11/08/21  Yes , Hildred Alamin E, FNP  azithromycin (ZITHROMAX Z-PAK) 250 MG tablet Take 2 tablets (500 mg total) by mouth daily for 1 day, THEN 1 tablet (250 mg total) daily for 4 days. 11/08/21 11/13/21 Yes , Michele Rockers, FNP  predniSONE (DELTASONE) 20 MG tablet Take 2 tablets (40 mg total) by mouth daily for 5 days. 11/08/21 11/13/21 Yes , Michele Rockers, FNP  acetaminophen (TYLENOL) 500 MG tablet Take 1,000 mg by mouth every 6 (six) hours as needed for headache. Patient not taking: Reported on 06/12/2021    [provider]  albuterol (VENTOLIN HFA) 108 (90 Base) MCG/ACT inhaler Inhale  2 puffs into the lungs every 6 (six) hours as needed for wheezing or shortness of breath. 02/16/19   [name], MD  atenolol-chlorthalidone (TENORETIC) 50-25 MG tablet Take 1 tablet by mouth daily. 03/29/21   [provider]  Cyanocobalamin (VITAMIN B-12 PO) Take 1 tablet by mouth daily.    [provider]  fluticasone (FLONASE) 50 MCG/ACT nasal spray Place 1 spray into both nostrils daily for 14 days. 05/25/19 06/08/19  [name], [name], PA-C  ibuprofen (ADVIL) 800 MG tablet Take 800 mg by mouth 3 (three)  times daily. 06/11/21   [provider]  spironolactone (ALDACTONE) 50 MG tablet Take by mouth. 09/03/19   [provider]  SUMAtriptan (IMITREX) 50 MG tablet Take 1 tablet (50 mg total) by mouth once as needed for migraine. May repeat in 2 hours if headache persists or recurs. Patient taking differently: Take 50 mg by mouth See admin instructions. Take one tablet (50 mg) by mouth once as needed for migraine headache, may repeat in 2 hours if headache persists or recurs. 09/22/18 04/09/19  [name], MD    Family History Family History  Problem Relation Age of Onset   Diabetes Mother    Hypertension Mother    Heart attack Mother        2016- 21 yrs   Breast cancer Maternal Grandmother    Hypertension Father    Colon cancer Neg Hx    Stomach cancer Neg Hx    Pancreatic cancer Neg Hx     Social History Social History   Tobacco Use   Smoking status: Former    Packs/day: 0.10    Types: Cigarettes    Quit date: 04/06/2015    Years since quitting: 6.5   Smokeless tobacco: Never  Vaping Use   Vaping Use: Never used  Substance Use Topics   Alcohol use: No    Alcohol/week: 0.0 standard drinks of alcohol   Drug use: No     Allergies   Penicillins, Lisinopril, Shellfish allergy, Sulfa antibiotics, Iodine, Keflex [cephalexin], and Latex   Review of Systems Review of Systems Per HPI  Physical Exam Triage Vital Signs ED Triage Vitals  Enc Vitals Group     BP 11/08/21 1125 125/76     Pulse Rate 11/08/21 1125 70     Resp 11/08/21 1125 18     Temp 11/08/21 1125 98.1 F (36.7 C)     Temp src --      SpO2 11/08/21 1125 96 %     Weight --      Height --      Head Circumference --      Peak Flow --      Pain Score 11/08/21 1124 8     Pain Loc --      Pain Edu? --      Excl. in [place]? --    No data found.  Updated Vital Signs BP 125/76   Pulse 70   Temp 98.1 F (36.7 C)   Resp 18   SpO2 96%   Visual Acuity Right Eye Distance:   Left Eye  Distance:   Bilateral Distance:    Right Eye Near:   Left Eye Near:    Bilateral Near:     Physical Exam Constitutional:      General: She is not in acute distress.    Appearance: Normal appearance. She is not toxic-appearing or diaphoretic.  HENT:     Head: Normocephalic and atraumatic.  Right Ear: Tympanic membrane and ear canal normal.     Left Ear: Tympanic membrane and ear canal normal.     Nose: Congestion present.     Mouth/Throat:     Mouth: Mucous membranes are moist.     Pharynx: Posterior oropharyngeal erythema present.  Eyes:     Extraocular Movements: Extraocular movements intact.     Conjunctiva/sclera: Conjunctivae normal.     Pupils: Pupils are equal, round, and reactive to light.  Cardiovascular:     Rate and Rhythm: Normal rate and regular rhythm.     Pulses: Normal pulses.     Heart sounds: Normal heart sounds.  Pulmonary:     Effort: Pulmonary effort is normal. No respiratory distress.     Breath sounds: No stridor. Wheezing present. No rhonchi or rales.     Comments: Minimal wheezing noted bilaterally. Abdominal:     General: Abdomen is flat. Bowel sounds are normal.     Palpations: Abdomen is soft.  Musculoskeletal:        General: Normal range of motion.     Cervical back: Normal range of motion.  Skin:    General: Skin is warm and dry.  Neurological:     General: No focal deficit present.     Mental Status: She is alert and oriented to person, place, and time. Mental status is at baseline.  Psychiatric:        Mood and Affect: Mood normal.        Behavior: Behavior normal.      UC Treatments / Results  Labs (all labs ordered are listed, but only abnormal results are displayed) Labs Reviewed  CULTURE, GROUP A STREP [facility])  POCT RAPID STREP A (OFFICE)    EKG   Radiology DG Chest 2 View  Result Date: 11/08/2021 CLINICAL DATA:  Shortness of breath and cough. EXAM: CHEST - 2 VIEW COMPARISON:  12/12/2020 FINDINGS: Heart size and  mediastinal contours are normal. No pleural effusion or edema. No airspace opacities identified. Visualized osseous structures are unremarkable. IMPRESSION: No active cardiopulmonary abnormalities. Electronically Signed   By: Kerby Moors M.D.   On: 11/08/2021 11:54    Procedures Procedures (including critical care time)  Medications Ordered in UC Medications  ipratropium-albuterol (DUONEB) 0.5-2.5 (3) MG/3ML nebulizer solution 3 mL (3 mLs Nebulization Given 11/08/21 1134)    Initial Impression / Assessment and Plan / UC Course  I have reviewed the triage vital signs and the nursing notes.  Pertinent labs & imaging results that were available during my care of the patient were reviewed by me and considered in my medical decision making (see chart for details).     Patient has persistent upper respiratory symptoms for over 10 days as well as persistent cough and wheezing.  Patient has asthma so suspect that acute illness is causing asthma flareup.  Chest x-ray was negative for any acute cardiopulmonary process.  I was suspicious of possible secondary bacterial infection given new onset sore throat but rapid strep was negative.  Throat culture is pending.  Do not think viral testing is necessary given duration of symptoms as it would not change treatment.  I do think patient would benefit from azithromycin given duration of upper respiratory symptoms and to cover for atypicals as well as prednisone to decrease inflammation associated with asthma flareup.  Will refill albuterol nebulizer treatment as well given patient request.  Patient was given DuoNeb in urgent care with minimal improvement in symptoms but did have improvement in  wheezing.  Patient was given strict return precautions.  Patient verbalized understanding and was agreeable with plan. Final Clinical Impressions(s) / UC Diagnoses   Final diagnoses:  Acute upper respiratory infection  Persistent cough  Mild intermittent asthma with  acute exacerbation  Sore throat     Discharge Instructions      Your chest x-ray was normal.  I have prescribed you 3 different medications including a refill on your albuterol nebulizer medication.  Please follow-up if symptoms persist or worsen.     ED Prescriptions     Medication Sig Dispense Auth. Provider   azithromycin (ZITHROMAX Z-PAK) 250 MG tablet Take 2 tablets (500 mg total) by mouth daily for 1 day, THEN 1 tablet (250 mg total) daily for 4 days. 6 tablet [place], Disney E, [place]   predniSONE (DELTASONE) 20 MG tablet Take 2 tablets (40 mg total) by mouth daily for 5 days. 10 tablet [place], Hildred Alamin E, [place]   albuterol (PROVENTIL) (2.5 MG/3ML) 0.083% nebulizer solution Take 3 mLs (2.5 mg total) by nebulization every 6 (six) hours as needed for wheezing or shortness of breath. 75 mL [name], [place]      PDMP not reviewed this encounter.   [name], [place] 11/08/21 1207

## 2021-11-08 NOTE — Discharge Instructions (Signed)
Your chest x-ray was normal.  I have prescribed you 3 different medications including a refill on your albuterol nebulizer medication.  Please follow-up if symptoms persist or worsen.

## 2021-11-08 NOTE — ED Triage Notes (Signed)
Pt is present today with c/o cough, nasal congestion, sore throat, SOB. Pt sx started one week ago

## 2021-11-08 NOTE — Patient Instructions (Addendum)
Consider switching your phone to cancel blue light from sunset to sunrise.  Aim to get 150 minutes of physical activity weekly. -start utilizing your gym membership  Consider having your vitamin D checked.   Consider Ardyth Harps podcast on PCOS.   Plan:  Aim for 2-3 Carb Choices per meal (30-45 grams) +/- 1 either way  Aim for 0-15 Carbs per snack if hungry  Include protein in moderation with your meals and snacks Consider reading food labels for Total Carbohydrate of foods Consider checking BG at alternate times per day   Aim to eat within 1-2 hours of waking up. -greek yogurt and granola -greek yogurt and fruit -toast with peanut butter and banana

## 2021-11-08 NOTE — Progress Notes (Signed)
Diabetes Self-Management Education  Visit Type: First/Initial  Appt. Start Time: 0740 Appt. End Time: 4332  11/08/2021  Victoria Booth, identified by name and date of birth, is a 41 y.o. female with a diagnosis of Diabetes: Type 2.   ASSESSMENT  Primary concern: Pt wants to know what foods she should and shouldn't be eating.  She feels that she goes all day sometimes without eating or just having snacks and feels that she is likely not eating enough but does not get hungry. Pt wants to control her diabetes without medication.   History includes:  anemia, type 2 diabetes, asthma, HTN, PCOS. Labs noted:  A1C 6.7% 10/02/21 Medications include:  reviewed Supplements: vitamin b12, emergency vitamin C drink Sleep: 4 hours, stays up late and wakes up early  Patient lives alone. Pt does shopping and cooking but she states she mostly goes out because she feels that she wastes food if she cooks it, but she will keep some groceries on hand.   Pt states she used to drink 4 pepsis a day but quit in December 2022. She states she switched this habit to sweet tea, but realized it was not good for her health and now drinks half and half sweet/unsweet tea.   Pt states she hates breakfast and typically skips. She states she sometimes only eats once a day. Pt reports that she will snack occasionally throughout the day but never feels very hungry.   Pt works from home and goes to class at [employer]. 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 Pt is considering starting to go to the gym to walk on the treadmill.  Height 5' 7"  (1.702 m), weight (!) 320 lb 4.8 oz (145.3 kg). Body mass index is 50.17 kg/m.   Diabetes Self-Management Education - 11/08/21 0749       Visit Information   Visit Type First/Initial      Initial Visit   Diabetes Type Type 2    Date Diagnosed 09/2021    Are you currently following a meal plan? No    Are you  taking your medications as prescribed? Not on Medications      Health Coping   How would you rate your overall health? Fair      Psychosocial Assessment   Patient Belief/Attitude about Diabetes Motivated to manage diabetes    What is the hardest part about your diabetes right now, causing you the most concern, or is the most worrisome to you about your diabetes?   Making healty food and beverage choices    Self-care barriers None    Self-management support Doctor's office    Other persons present Patient    Patient Concerns Nutrition/Meal planning    Special Needs None    Preferred Learning Style No preference indicated    Learning Readiness Ready    How often do you need to have someone help you when you read instructions, pamphlets, or other written materials from your doctor or pharmacy? 1 - Never    What is the last grade level you completed in school? vocational school      Pre-Education Assessment   Patient understands the diabetes disease and treatment process. Needs Instruction    Patient understands incorporating nutritional management into lifestyle. Needs Instruction    Patient undertands incorporating physical activity into lifestyle. Needs Instruction    Patient understands using medications safely. Needs Instruction    Patient understands  monitoring blood glucose, interpreting and using results Needs Instruction    Patient understands prevention, detection, and treatment of acute complications. Needs Instruction    Patient understands prevention, detection, and treatment of chronic complications. Needs Instruction    Patient understands how to develop strategies to address psychosocial issues. Needs Instruction    Patient understands how to develop strategies to promote health/change behavior. Needs Instruction      Complications   Last HgB A1C per patient/outside source 6.7 %    How often do you check your blood sugar? 1-2 times/day    Have you had a dilated eye exam in  the past 12 months? Yes    Have you had a dental exam in the past 12 months? Yes    Are you checking your feet? Yes    How many days per week are you checking your feet? 7      Dietary Intake   Breakfast none    Lunch 1:30pm: salmon and fettucine alfredo (1/2 from leftovers)    Snack (evening) 11pm: taco bell taco    Beverage(s) 64+ oz water, grape cranberry juice (16oz), hot tea with some honey      Activity / Exercise   Activity / Exercise Type ADL's    How many days per week do you exercise? 0    How many minutes per day do you exercise? 0    Total minutes per week of exercise 0      Patient Education   Previous Diabetes Education No    Disease Pathophysiology Definition of diabetes, type 1 and 2, and the diagnosis of diabetes;Factors that contribute to the development of diabetes;Explored patient's options for treatment of their diabetes    Healthy Eating Role of diet in the treatment of diabetes and the relationship between the three main macronutrients and blood glucose level;Food label reading, portion sizes and measuring food.;Plate Method;Carbohydrate counting;Meal timing in regards to the patients' current diabetes medication.;Reviewed blood glucose goals for pre and post meals and how to evaluate the patients' food intake on their blood glucose level.;Information on hints to eating out and maintain blood glucose control.;Meal options for control of blood glucose level and chronic complications.    Being Active Role of exercise on diabetes management, blood pressure control and cardiac health.;Identified with patient nutritional and/or medication changes necessary with exercise.;Helped patient identify appropriate exercises in relation to his/her diabetes, diabetes complications and other health issue.    Medications Reviewed patients medication for diabetes, action, purpose, timing of dose and side effects.;Reviewed medication adjustment guidelines for hyperglycemia and sick days.     Monitoring Identified appropriate SMBG and/or A1C goals.;Daily foot exams;Yearly dilated eye exam    Acute complications Taught prevention, symptoms, and  treatment of hypoglycemia - the 15 rule.;Discussed and identified patients' prevention, symptoms, and treatment of hyperglycemia.    Chronic complications Relationship between chronic complications and blood glucose control;Assessed and discussed foot care and prevention of foot problems;Lipid levels, blood glucose control and heart disease;Dental care;Identified and discussed with patient  current chronic complications;Retinopathy and reason for yearly dilated eye exams;Nephropathy, what it is, prevention of, the use of ACE, ARB's and early detection of through urine microalbumia.;Reviewed with patient heart disease, higher risk of, and prevention    Diabetes Stress and Support Identified and addressed patients feelings and concerns about diabetes;Worked with patient to identify barriers to care and solutions;Role of stress on diabetes    Preconception care Reviewed with patient blood glucose goals with pregnancy  Lifestyle and Health Coping Lifestyle issues that need to be addressed for better diabetes care      Individualized Goals (developed by patient)   Nutrition Follow meal plan discussed;General guidelines for healthy choices and portions discussed    Physical Activity Exercise 3-5 times per week;30 minutes per day    Medications Not Applicable    Monitoring  Test my blood glucose as discussed    Problem Solving Eating Pattern;Sleep Pattern    Reducing Risk examine blood glucose patterns;do foot checks daily;treat hypoglycemia with 15 grams of carbs if blood glucose less than 60m/dL    Health Coping Ask for help with psychological, social, or emotional issues      Post-Education Assessment   Patient understands the diabetes disease and treatment process. Comprehends key points    Patient understands incorporating nutritional  management into lifestyle. Comprehends key points    Patient undertands incorporating physical activity into lifestyle. Comprehends key points    Patient understands using medications safely. N/A    Patient understands monitoring blood glucose, interpreting and using results Comprehends key points    Patient understands prevention, detection, and treatment of acute complications. Demonstrates understanding / competency    Patient understands prevention, detection, and treatment of chronic complications. Demonstrates understanding / competency    Patient understands how to develop strategies to address psychosocial issues. Demonstrates understanding / competency    Patient understands how to develop strategies to promote health/change behavior. Demonstrates understanding / competency      Outcomes   Expected Outcomes Demonstrated interest in learning. Expect positive outcomes    Future DMSE 2 months    Program Status Not Completed             Individualized Plan for Diabetes Self-Management Training:   Learning Objective:  Patient will have a greater understanding of diabetes self-management. Patient education plan is to attend individual and/or group sessions per assessed needs and concerns.   Plan:   Patient Instructions  Consider switching your phone to cancel blue light from sunset to sunrise.  Aim to get 150 minutes of physical activity weekly. -start utilizing your gym membership  Consider having your vitamin D checked.   Consider JArdyth Harpspodcast on PCOS.   Plan:  Aim for 2-3 Carb Choices per meal (30-45 grams) +/- 1 either way  Aim for 0-15 Carbs per snack if hungry  Include protein in moderation with your meals and snacks Consider reading food labels for Total Carbohydrate of foods Consider checking BG at alternate times per day   Aim to eat within 1-2 hours of waking up. -greek yogurt and granola -greek yogurt and fruit -toast with peanut butter and  banana  Expected Outcomes:  Demonstrated interest in learning. Expect positive outcomes  Education material provided: ADA - How to Thrive: A Guide for Your Journey with Diabetes, A1C conversion sheet, Meal plan card, My Plate, and Snack sheet  If problems or questions, patient to contact team via:  Phone  Future DSME appointment: 2 months

## 2021-11-11 LAB — CULTURE, GROUP A STREP (THRC)

## 2022-01-16 ENCOUNTER — Encounter: Payer: Managed Care, Other (non HMO) | Admitting: Dietician

## 2022-02-20 ENCOUNTER — Ambulatory Visit
Admission: RE | Admit: 2022-02-20 | Discharge: 2022-02-20 | Disposition: A | Discharge status: Home / Self Care | Payer: Managed Care, Other (non HMO) | Source: Ambulatory Visit | Attending: Physician Assistant | Admitting: Physician Assistant

## 2022-02-20 ENCOUNTER — Ambulatory Visit: Payer: Managed Care, Other (non HMO)

## 2022-02-20 VITALS — BP 143/90 | HR 79 | Temp 98.6°F | Resp 18

## 2022-02-20 DIAGNOSIS — M79671 Pain in right foot: Secondary | ICD-10-CM

## 2022-02-20 DIAGNOSIS — S92534A Nondisplaced fracture of distal phalanx of right lesser toe(s), initial encounter for closed fracture: Secondary | ICD-10-CM | POA: Diagnosis not present

## 2022-02-20 NOTE — ED Triage Notes (Signed)
Pt presents with right foot injury after dropping her stove on it X 3 days ago.

## 2022-02-20 NOTE — ED Provider Notes (Signed)
[facility]    CSN: [id number] Arrival date & time: 02/20/22  0855      History   Chief Complaint Chief Complaint  Patient presents with   APPT : Foot Injury     HPI Victoria Booth is a 42 y.o. female.   Here today for evaluation of injury to her right foot that occurred 3 days ago.  She reports that she has continued to have pain and swelling to her right fourth toe after she accidentally dropped her stove on her foot.  She denies any numbness until recently.  She states swelling seems to be worsening.  She has tried over-the-counter medication, ice and heat without resolution.  The history is provided by the patient.    Past Medical History:  Diagnosis Date   Anemia    Asthma    Crohn disease ([place])    Hypertension    LUQ abdominal pain 10/16/2017   Microcytic anemia 12/01/2014   Obesity     Patient Active Problem List   Diagnosis Date Noted   Iron deficiency anemia due to chronic blood loss 06/12/2021   Anxiety 03/04/2019   Hypertensive urgency 12/14/2018   Vitamin D deficiency 08/04/2018   Nephrolithiasis 07/14/2018   Functional dyspepsia 02/20/2018   IBS (irritable colon syndrome) 02/20/2018   Iron deficiency anemia 08/18/2017   Acute pain of left shoulder 08/18/2017   Insomnia 07/29/2017   Left lower quadrant abdominal pain of unknown etiology 07/13/2017   OSA (obstructive sleep apnea) 06/05/2017   Major depressive disorder 03/21/2017   Migraine 10/24/2016   Hematuria 09/24/2016   Prediabetes 08/21/2016   Allergic rhinitis 06/18/2016   Left flank pain 06/05/2016   PCOS (polycystic ovarian syndrome) 04/05/2016   Essential hypertension, benign 12/01/2014   Asthma, chronic 12/01/2014   Gastroesophageal reflux disease 12/01/2014   Preventative health care 12/01/2014    Past Surgical History:  Procedure Laterality Date   EXTRACORPOREAL SHOCK WAVE LITHOTRIPSY Left 07/16/2018   Procedure: EXTRACORPOREAL SHOCK WAVE LITHOTRIPSY (ESWL);   Surgeon: Hollice Espy, MD;  Location: [facility] ORS;  Service: Urology;  Laterality: Left;   TOENAIL EXCISION      OB History   No obstetric history on file.      Home Medications    Prior to Admission medications   Medication Sig Start Date End Date Taking? Authorizing Provider  acetaminophen (TYLENOL) 500 MG tablet Take 1,000 mg by mouth every 6 (six) hours as needed for headache. Patient not taking: Reported on 06/12/2021    [provider]  albuterol (PROVENTIL) (2.5 MG/3ML) 0.083% nebulizer solution Take 3 mLs (2.5 mg total) by nebulization every 6 (six) hours as needed for wheezing or shortness of breath. 11/08/21   [name], FNP  albuterol (VENTOLIN HFA) 108 (90 Base) MCG/ACT inhaler Inhale 2 puffs into the lungs every 6 (six) hours as needed for wheezing or shortness of breath. 02/16/19   [name], MD  atenolol-chlorthalidone (TENORETIC) 50-25 MG tablet Take 1 tablet by mouth daily. 03/29/21   [provider]  Cyanocobalamin (VITAMIN B-12 PO) Take 1 tablet by mouth daily.    [provider]  fluticasone (FLONASE) 50 MCG/ACT nasal spray Place 1 spray into both nostrils daily for 14 days. 05/25/19 06/08/19  [name], [name], PA-C  ibuprofen (ADVIL) 800 MG tablet Take 800 mg by mouth 3 (three) times daily. 06/11/21   [provider]  spironolactone (ALDACTONE) 50 MG tablet Take by mouth. 09/03/19   [provider]  SUMAtriptan (IMITREX) 50  MG tablet Take 1 tablet (50 mg total) by mouth once as needed for migraine. May repeat in 2 hours if headache persists or recurs. Patient taking differently: Take 50 mg by mouth See admin instructions. Take one tablet (50 mg) by mouth once as needed for migraine headache, may repeat in 2 hours if headache persists or recurs. 09/22/18 04/09/19  [name], MD    Family History Family History  Problem Relation Age of Onset   Diabetes Mother    Hypertension Mother    Heart attack Mother        2016- 52  yrs   Breast cancer Maternal Grandmother    Hypertension Father    Colon cancer Neg Hx    Stomach cancer Neg Hx    Pancreatic cancer Neg Hx     Social History Social History   Tobacco Use   Smoking status: Former    Packs/day: 0.10    Types: Cigarettes    Quit date: 04/06/2015    Years since quitting: 6.8   Smokeless tobacco: Never  Vaping Use   Vaping Use: Never used  Substance Use Topics   Alcohol use: No    Alcohol/week: 0.0 standard drinks of alcohol   Drug use: No     Allergies   Penicillins, Lisinopril, Shellfish allergy, Sulfa antibiotics, Iodine, Keflex [cephalexin], and Latex   Review of Systems Review of Systems  Constitutional:  Negative for chills and fever.  Eyes:  Negative for discharge and redness.  Gastrointestinal:  Negative for abdominal pain, nausea and vomiting.  Musculoskeletal:  Positive for arthralgias and joint swelling.     Physical Exam Triage Vital Signs ED Triage Vitals  Enc Vitals Group     BP      Pulse      Resp      Temp      Temp src      SpO2      Weight      Height      Head Circumference      Peak Flow      Pain Score      Pain Loc      Pain Edu?      Excl. in [place]?    No data found.  Updated Vital Signs BP (!) 143/90   Pulse 79   Temp 98.6 F (37 C) (Oral)   Resp 18   SpO2 95%      Physical Exam Vitals and nursing note reviewed.  Constitutional:      General: She is not in acute distress.    Appearance: Normal appearance. She is not ill-appearing.  HENT:     Head: Normocephalic and atraumatic.  Eyes:     Conjunctiva/sclera: Conjunctivae normal.  Cardiovascular:     Rate and Rhythm: Normal rate.  Pulmonary:     Effort: Pulmonary effort is normal.  Musculoskeletal:     Comments: Diffuse swelling and bruising noted to right 4th toe with TTP to same, decreased ROM, Normal ROM of remainder of foot/ toes  Skin:    Capillary Refill: Normal cap refill to right 4th toe Neurological:     Mental Status: She  is alert.     Comments: Gross sensation intact to left 4th toe distally  Psychiatric:        Mood and Affect: Mood normal.        Behavior: Behavior normal.        Thought Content: Thought content normal.      UC  Treatments / Results  Labs (all labs ordered are listed, but only abnormal results are displayed) Labs Reviewed - No data to display  EKG   Radiology DG Foot Complete Right  Result Date: 02/20/2022 CLINICAL DATA:  Drop stove on right foot. Pain in the third, fourth and fifth toes. EXAM: RIGHT FOOT COMPLETE - 3+ VIEW COMPARISON:  05/11/2003 FINDINGS: Subtle irregularity involving the distal aspect of the fourth toe proximal phalanx. This is concerning for a subtle fracture. No other definite fracture or dislocation. Evidence for mild soft tissue swelling along the dorsal aspect of the foot. IMPRESSION: Question a subtle fracture involving the fourth toe proximal phalanx near the PIP joint. Electronically Signed   By: Richarda Overlie M.D.   On: 02/20/2022 10:01    Procedures Procedures (including critical care time)  Medications Ordered in UC Medications - No data to display  Initial Impression / Assessment and Plan / UC Course  I have reviewed the triage vital signs and the nursing notes.  Pertinent labs & imaging results that were available during my care of the patient were reviewed by me and considered in my medical decision making (see chart for details).    Fracture noted on x-ray will order buddy taping as well as postop shoe.  Recommended ice, elevation and symptomatic treatment as needed and follow-up should symptoms fail to improve or worsen.  Final Clinical Impressions(s) / UC Diagnoses   Final diagnoses:  Nondisplaced fracture of distal phalanx of right lesser toe(s), initial encounter for closed fracture   Discharge Instructions   None    ED Prescriptions   None    PDMP not reviewed this encounter.   Tomi Bamberger, PA-C 02/20/22 1905

## 2022-04-10 ENCOUNTER — Emergency Department (HOSPITAL_BASED_OUTPATIENT_CLINIC_OR_DEPARTMENT_OTHER)
Admission: EM | Admit: 2022-04-10 | Discharge: 2022-04-10 | Disposition: A | Discharge status: Home / Self Care | Payer: BC Managed Care – PPO | Attending: Emergency Medicine | Admitting: Emergency Medicine

## 2022-04-10 ENCOUNTER — Encounter (HOSPITAL_BASED_OUTPATIENT_CLINIC_OR_DEPARTMENT_OTHER): Payer: Self-pay | Admitting: Emergency Medicine

## 2022-04-10 ENCOUNTER — Encounter: Payer: Self-pay | Admitting: Internal Medicine

## 2022-04-10 ENCOUNTER — Other Ambulatory Visit: Payer: Self-pay

## 2022-04-10 ENCOUNTER — Emergency Department (HOSPITAL_BASED_OUTPATIENT_CLINIC_OR_DEPARTMENT_OTHER): Payer: BC Managed Care – PPO

## 2022-04-10 DIAGNOSIS — R6883 Chills (without fever): Secondary | ICD-10-CM | POA: Diagnosis present

## 2022-04-10 DIAGNOSIS — R112 Nausea with vomiting, unspecified: Secondary | ICD-10-CM | POA: Diagnosis not present

## 2022-04-10 DIAGNOSIS — R197 Diarrhea, unspecified: Secondary | ICD-10-CM | POA: Diagnosis not present

## 2022-04-10 DIAGNOSIS — Z91040 Latex allergy status: Secondary | ICD-10-CM | POA: Diagnosis not present

## 2022-04-10 DIAGNOSIS — Z1152 Encounter for screening for COVID-19: Secondary | ICD-10-CM | POA: Insufficient documentation

## 2022-04-10 LAB — CBC WITH DIFFERENTIAL/PLATELET
Abs Immature Granulocytes: 0.08 10*3/uL — ABNORMAL HIGH (ref 0.00–0.07)
Basophils Absolute: 0.1 10*3/uL (ref 0.0–0.1)
Basophils Relative: 1 %
Eosinophils Absolute: 0.2 10*3/uL (ref 0.0–0.5)
Eosinophils Relative: 2 %
HCT: 41 % (ref 36.0–46.0)
Hemoglobin: 12.7 g/dL (ref 12.0–15.0)
Immature Granulocytes: 1 %
Lymphocytes Relative: 41 %
Lymphs Abs: 4.6 10*3/uL — ABNORMAL HIGH (ref 0.7–4.0)
MCH: 24.1 pg — ABNORMAL LOW (ref 26.0–34.0)
MCHC: 31 g/dL (ref 30.0–36.0)
MCV: 77.8 fL — ABNORMAL LOW (ref 80.0–100.0)
Monocytes Absolute: 0.6 10*3/uL (ref 0.1–1.0)
Monocytes Relative: 5 %
Neutro Abs: 5.7 10*3/uL (ref 1.7–7.7)
Neutrophils Relative %: 50 %
Platelets: 342 10*3/uL (ref 150–400)
RBC: 5.27 MIL/uL — ABNORMAL HIGH (ref 3.87–5.11)
RDW: 16.6 % — ABNORMAL HIGH (ref 11.5–15.5)
WBC: 11.2 10*3/uL — ABNORMAL HIGH (ref 4.0–10.5)
nRBC: 0 % (ref 0.0–0.2)

## 2022-04-10 LAB — URINALYSIS, ROUTINE W REFLEX MICROSCOPIC
Bilirubin Urine: NEGATIVE
Glucose, UA: NEGATIVE mg/dL
Hgb urine dipstick: NEGATIVE
Ketones, ur: NEGATIVE mg/dL
Leukocytes,Ua: NEGATIVE
Nitrite: NEGATIVE
Protein, ur: NEGATIVE mg/dL
Specific Gravity, Urine: >=1.03 (ref 1.005–1.030)
pH: 6 (ref 5.0–8.0)

## 2022-04-10 LAB — COMPREHENSIVE METABOLIC PANEL
ALT: 20 U/L (ref 0–44)
AST: 24 U/L (ref 15–41)
Albumin: 3.5 g/dL (ref 3.5–5.0)
Alkaline Phosphatase: 102 U/L (ref 38–126)
Anion gap: 8 (ref 5–15)
BUN: 10 mg/dL (ref 6–20)
CO2: 24 mmol/L (ref 22–32)
Calcium: 8.9 mg/dL (ref 8.9–10.3)
Chloride: 102 mmol/L (ref 98–111)
Creatinine, Ser: 0.79 mg/dL (ref 0.44–1.00)
GFR, Estimated: >60 mL/min (ref >60)
Glucose, Bld: 113 mg/dL — ABNORMAL HIGH (ref 70–99)
Potassium: 3.8 mmol/L (ref 3.5–5.1)
Sodium: 134 mmol/L — ABNORMAL LOW (ref 135–145)
Total Bilirubin: 0.6 mg/dL (ref 0.3–1.2)
Total Protein: 8.3 g/dL — ABNORMAL HIGH (ref 6.5–8.1)

## 2022-04-10 LAB — RESP PANEL BY RT-PCR (RSV, FLU A&B, COVID)  RVPGX2
Influenza A by PCR: NEGATIVE
Influenza B by PCR: NEGATIVE
Resp Syncytial Virus by PCR: NEGATIVE
SARS Coronavirus 2 by RT PCR: NEGATIVE

## 2022-04-10 LAB — PREGNANCY, URINE: Preg Test, Ur: NEGATIVE

## 2022-04-10 MED ORDER — SODIUM CHLORIDE 0.9 % IV BOLUS
1000.0000 mL | Freq: Once | INTRAVENOUS | Status: AC
  Administered 2022-04-10: 1000 mL via INTRAVENOUS

## 2022-04-10 MED ORDER — ONDANSETRON HCL 4 MG/2ML IJ SOLN
4.0000 mg | Freq: Once | INTRAMUSCULAR | Status: AC
  Administered 2022-04-10: 4 mg via INTRAVENOUS
  Filled 2022-04-10: qty 2

## 2022-04-10 MED ORDER — ONDANSETRON HCL 4 MG PO TABS
4.0000 mg | ORAL_TABLET | Freq: Four times a day (QID) | ORAL | 0 refills | Status: DC
Start: 2022-04-10 — End: 2023-02-03

## 2022-04-10 NOTE — Discharge Instructions (Addendum)
Evaluation today revealed that you likely have a viral or foodborne illness of some kind is causing your symptoms.  Encouraged to know that your vomiting has improved. Symptoms usually improve after 48 hours.  I have sent Zofran to your pharmacy for your nausea and vomiting.  If your diarrhea persists you can take Imodium which can be purchased over-the-counter.  If you have abdominal pain, bloody stools, bloody vomit, bloody urine, or any other concerning symptom please return to the emergency department for further evaluation.

## 2022-04-10 NOTE — ED Triage Notes (Signed)
Pt w/ c/o general malaise and chills since Monday

## 2022-04-10 NOTE — ED Provider Notes (Signed)
[facility] Provider Note   CSN: [id number] Arrival date & time: 04/10/22  2108     History  Chief Complaint  Patient presents with   Chills   HPI Victoria Booth is a 42 y.o. female presenting for chills, nausea vomiting diarrhea.  Chills started on Monday.  The nausea vomiting and diarrhea started yesterday afternoon all of a sudden.  States she last vomited around 3 PM today.  Output is nonbloody.  Denies urinary changes.  Denies abdominal pain.  States she is now tolerating oral fluid intake.  States she is feeling better overall but "wanted to make sure everything was okay".  HPI     Home Medications Prior to Admission medications   Medication Sig Start Date End Date Taking? Authorizing Provider  ondansetron (ZOFRAN) 4 MG tablet Take 1 tablet (4 mg total) by mouth every 6 (six) hours. 04/10/22  Yes [name], PA-C  acetaminophen (TYLENOL) 500 MG tablet Take 1,000 mg by mouth every 6 (six) hours as needed for headache. Patient not taking: Reported on 06/12/2021    [provider]  albuterol (PROVENTIL) (2.5 MG/3ML) 0.083% nebulizer solution Take 3 mLs (2.5 mg total) by nebulization every 6 (six) hours as needed for wheezing or shortness of breath. 11/08/21   [name], FNP  albuterol (VENTOLIN HFA) 108 (90 Base) MCG/ACT inhaler Inhale 2 puffs into the lungs every 6 (six) hours as needed for wheezing or shortness of breath. 02/16/19   [name], MD  atenolol-chlorthalidone (TENORETIC) 50-25 MG tablet Take 1 tablet by mouth daily. 03/29/21   [provider]  Cyanocobalamin (VITAMIN B-12 PO) Take 1 tablet by mouth daily.    [provider]  fluticasone (FLONASE) 50 MCG/ACT nasal spray Place 1 spray into both nostrils daily for 14 days. 05/25/19 06/08/19  [name], [name], PA-C  ibuprofen (ADVIL) 800 MG tablet Take 800 mg by mouth 3 (three) times daily. 06/11/21   [provider]  spironolactone  (ALDACTONE) 50 MG tablet Take by mouth. 09/03/19   [provider]  SUMAtriptan (IMITREX) 50 MG tablet Take 1 tablet (50 mg total) by mouth once as needed for migraine. May repeat in 2 hours if headache persists or recurs. Patient taking differently: Take 50 mg by mouth See admin instructions. Take one tablet (50 mg) by mouth once as needed for migraine headache, may repeat in 2 hours if headache persists or recurs. 09/22/18 04/09/19  [name], MD      Allergies    Penicillins, Lisinopril, Shellfish allergy, Sulfa antibiotics, Iodine, Keflex [cephalexin], and Latex    Review of Systems   See HPI for pertinent positives   Physical Exam   Vitals:   04/10/22 2119  BP: (!) 146/97  Pulse: 89  Resp: 18  Temp: 99 F (37.2 C)  SpO2: 98%    CONSTITUTIONAL:  well-appearing, NAD NEURO:  Alert and oriented x 3, CN 3-12 grossly intact EYES:  eyes equal and reactive ENT/NECK:  Supple, no stridor  CARDIO:  regular rate and rhythm, appears well-perfused  PULM:  No respiratory distress, CTAB GI/GU:  non-distended, soft, non tender MSK/SPINE:  No gross deformities, no edema, moves all extremities  SKIN:  no rash, atraumatic   *Additional and/or pertinent findings included in MDM below    ED Results / Procedures / Treatments   Labs (all labs ordered are listed, but only abnormal results are displayed) Labs Reviewed  CBC WITH DIFFERENTIAL/PLATELET - Abnormal; Notable for  the following components:      Result Value   WBC 11.2 (*)    RBC 5.27 (*)    MCV 77.8 (*)    MCH 24.1 (*)    RDW 16.6 (*)    Lymphs Abs 4.6 (*)    Abs Immature Granulocytes 0.08 (*)    All other components within normal limits  COMPREHENSIVE METABOLIC PANEL - Abnormal; Notable for the following components:   Sodium 134 (*)    Glucose, Bld 113 (*)    Total Protein 8.3 (*)    All other components within normal limits  RESP PANEL BY RT-PCR (RSV, FLU A&B, COVID)  RVPGX2  URINALYSIS, ROUTINE W REFLEX  MICROSCOPIC  PREGNANCY, URINE    EKG None  Radiology DG Chest 2 View  Result Date: 04/10/2022 CLINICAL DATA:  Cough and chills. EXAM: CHEST - 2 VIEW COMPARISON:  Radiograph 10/19/2021 FINDINGS: The cardiomediastinal contours are normal. Mild bronchial thickening. Pulmonary vasculature is normal. No consolidation, pleural effusion, or pneumothorax. No acute osseous abnormalities are seen. IMPRESSION: Mild bronchial thickening. No pneumonia. Electronically Signed   By: Keith Rake M.D.   On: 04/10/2022 23:01    Procedures Procedures    Medications Ordered in ED Medications  ondansetron [facility]) injection 4 mg (has no administration in time range)  sodium chloride 0.9 % bolus 1,000 mL (1,000 mLs Intravenous New Bag/Given 04/10/22 2222)    ED Course/ Medical Decision Making/ A&P Clinical Course as of 04/10/22 2346  Wed Apr 10, 2022  2313 BMI (Calculated): 46.04 [JR]    Clinical Course User Index [JR] [name], PA-C                             Medical Decision Making Amount and/or Complexity of Data Reviewed Labs: ordered. Radiology: ordered.   42 year old female who is well-appearing and hemodynamically stable presenting for nausea, vomiting, diarrhea, and chills.  Exam unremarkable.  DDx includes intra-abdominal infection versus inflammation, dehydration, URI, and foodborne illness.  Considered intra-abdominal infection versus inflammation but unlikely given soft, nondistended and nontender abdomen.  Considered urinary or renal cause but unlikely given no urinary changes or CVA tenderness.  Given the acute onset of her nausea vomiting diarrhea with improvement of symptoms in the last 24 hours it is likely a foodborne illness with possible gastroenteritis.  Fluid challenged without complication.  Treated her nausea and vomiting with Zofran.  Volume resuscitated with normal saline bolus.  Patient stated symptoms improved.  Advised that she continue conservative treatment  at home for likely viral or foodborne illness of some kind.  Discharged stable vitals.  Advised to follow-up with her PCP.  Discussed return precautions.  Sent Zofran to her pharmacy.        Final Clinical Impression(s) / ED Diagnoses Final diagnoses:  Chills  Nausea vomiting and diarrhea    Rx / DC Orders ED Discharge Orders          Ordered    ondansetron (ZOFRAN) 4 MG tablet  Every 6 hours        04/10/22 2345              [name], PA-C 04/10/22 2348    Drenda Freeze, MD 04/12/22 [phone]

## 2022-04-11 ENCOUNTER — Encounter: Payer: Self-pay | Admitting: Internal Medicine

## 2022-05-08 ENCOUNTER — Ambulatory Visit
Admission: EM | Admit: 2022-05-08 | Discharge: 2022-05-08 | Disposition: A | Discharge status: Home / Self Care | Payer: BC Managed Care – PPO | Attending: Internal Medicine | Admitting: Internal Medicine

## 2022-05-08 ENCOUNTER — Encounter: Payer: Self-pay | Admitting: Internal Medicine

## 2022-05-08 DIAGNOSIS — J069 Acute upper respiratory infection, unspecified: Secondary | ICD-10-CM | POA: Diagnosis not present

## 2022-05-08 MED ORDER — BENZONATATE 100 MG PO CAPS: 100.0000 mg | ORAL_CAPSULE | Freq: Three times a day (TID) | ORAL | 0 refills | Status: DC | PRN

## 2022-05-08 MED ORDER — PREDNISONE 20 MG PO TABS: 40.0000 mg | ORAL_TABLET | Freq: Every day | ORAL | 0 refills | Status: AC

## 2022-05-08 NOTE — ED Provider Notes (Signed)
[facility]    CSN: [id number] Arrival date & time: 05/08/22  1816      History   Chief Complaint Chief Complaint  Patient presents with   nasal drainage    HPI Victoria Booth is a 42 y.o. female.   Patient presents with cough, nasal drainage, nasal congestion that started about 4 days ago.  Patient denies any known sick contacts or fever.  Patient has a history of asthma so has been using albuterol nebulizer treatments with minimal improvement given that she heard herself wheezing.  She also reports that she had a 40 mg dose of leftover prednisone that she took today.  Denies any current chest pain, shortness of breath, gastrointestinal symptoms.     Past Medical History:  Diagnosis Date   Anemia    Asthma    Crohn disease ([place])    Hypertension    LUQ abdominal pain 10/16/2017   Microcytic anemia 12/01/2014   Obesity     Patient Active Problem List   Diagnosis Date Noted   Iron deficiency anemia due to chronic blood loss 06/12/2021   Anxiety 03/04/2019   Hypertensive urgency 12/14/2018   Vitamin D deficiency 08/04/2018   Nephrolithiasis 07/14/2018   Functional dyspepsia 02/20/2018   IBS (irritable colon syndrome) 02/20/2018   Iron deficiency anemia 08/18/2017   Acute pain of left shoulder 08/18/2017   Insomnia 07/29/2017   Left lower quadrant abdominal pain of unknown etiology 07/13/2017   OSA (obstructive sleep apnea) 06/05/2017   Major depressive disorder 03/21/2017   Migraine 10/24/2016   Hematuria 09/24/2016   Prediabetes 08/21/2016   Allergic rhinitis 06/18/2016   Left flank pain 06/05/2016   PCOS (polycystic ovarian syndrome) 04/05/2016   Essential hypertension, benign 12/01/2014   Asthma, chronic 12/01/2014   Gastroesophageal reflux disease 12/01/2014   Preventative health care 12/01/2014    Past Surgical History:  Procedure Laterality Date   EXTRACORPOREAL SHOCK WAVE LITHOTRIPSY Left 07/16/2018   Procedure: EXTRACORPOREAL SHOCK  WAVE LITHOTRIPSY (ESWL);  Surgeon: Hollice Espy, MD;  Location: [facility] ORS;  Service: Urology;  Laterality: Left;   TOENAIL EXCISION      OB History   No obstetric history on file.      Home Medications    Prior to Admission medications   Medication Sig Start Date End Date Taking? Authorizing Provider  benzonatate (TESSALON) 100 MG capsule Take 1 capsule (100 mg total) by mouth every 8 (eight) hours as needed for cough. 05/08/22  Yes , Hildred Alamin E, FNP  predniSONE (DELTASONE) 20 MG tablet Take 2 tablets (40 mg total) by mouth daily for 4 days. 05/08/22 05/12/22 Yes , Michele Rockers, FNP  acetaminophen (TYLENOL) 500 MG tablet Take 1,000 mg by mouth every 6 (six) hours as needed for headache. Patient not taking: Reported on 06/12/2021    [provider]  albuterol (PROVENTIL) (2.5 MG/3ML) 0.083% nebulizer solution Take 3 mLs (2.5 mg total) by nebulization every 6 (six) hours as needed for wheezing or shortness of breath. 11/08/21   [name], FNP  albuterol (VENTOLIN HFA) 108 (90 Base) MCG/ACT inhaler Inhale 2 puffs into the lungs every 6 (six) hours as needed for wheezing or shortness of breath. 02/16/19   [name], MD  atenolol-chlorthalidone (TENORETIC) 50-25 MG tablet Take 1 tablet by mouth daily. 03/29/21   [provider]  Cyanocobalamin (VITAMIN B-12 PO) Take 1 tablet by mouth daily.    [provider]  fluticasone (FLONASE) 50 MCG/ACT nasal spray Place 1 spray into  both nostrils daily for 14 days. 05/25/19 06/08/19  [name], [name], PA-C  ibuprofen (ADVIL) 800 MG tablet Take 800 mg by mouth 3 (three) times daily. 06/11/21   [provider]  ondansetron (ZOFRAN) 4 MG tablet Take 1 tablet (4 mg total) by mouth every 6 (six) hours. 04/10/22   [name], PA-C  spironolactone (ALDACTONE) 50 MG tablet Take by mouth. 09/03/19   [provider]  SUMAtriptan (IMITREX) 50 MG tablet Take 1 tablet (50 mg total) by mouth once as needed for migraine. May  repeat in 2 hours if headache persists or recurs. Patient taking differently: Take 50 mg by mouth See admin instructions. Take one tablet (50 mg) by mouth once as needed for migraine headache, may repeat in 2 hours if headache persists or recurs. 09/22/18 04/09/19  [name], MD    Family History Family History  Problem Relation Age of Onset   Diabetes Mother    Hypertension Mother    Heart attack Mother        2016- 36 yrs   Breast cancer Maternal Grandmother    Hypertension Father    Colon cancer Neg Hx    Stomach cancer Neg Hx    Pancreatic cancer Neg Hx     Social History Social History   Tobacco Use   Smoking status: Former    Packs/day: .1    Types: Cigarettes    Quit date: 04/06/2015    Years since quitting: 7.0   Smokeless tobacco: Never  Vaping Use   Vaping Use: Never used  Substance Use Topics   Alcohol use: No    Alcohol/week: 0.0 standard drinks of alcohol   Drug use: No     Allergies   Penicillins, Lisinopril, Shellfish allergy, Sulfa antibiotics, Iodine, Keflex [cephalexin], and Latex   Review of Systems Review of Systems Per HPI  Physical Exam Triage Vital Signs ED Triage Vitals [05/08/22 1830]  Enc Vitals Group     BP (!) 168/97     Pulse Rate 100     Resp 18     Temp 98 F (36.7 C)     Temp Source Oral     SpO2 97 %     Weight      Height      Head Circumference      Peak Flow      Pain Score 8     Pain Loc      Pain Edu?      Excl. in [place]?    No data found.  Updated Vital Signs BP (!) 168/97 (BP Location: Left Arm)   Pulse 100   Temp 98 F (36.7 C) (Oral)   Resp 18   SpO2 97%   Visual Acuity Right Eye Distance:   Left Eye Distance:   Bilateral Distance:    Right Eye Near:   Left Eye Near:    Bilateral Near:     Physical Exam Constitutional:      General: She is not in acute distress.    Appearance: Normal appearance. She is not toxic-appearing or diaphoretic.  HENT:     Head: Normocephalic and atraumatic.      Right Ear: Tympanic membrane and ear canal normal.     Left Ear: Tympanic membrane and ear canal normal.     Nose: Congestion present.     Mouth/Throat:     Mouth: Mucous membranes are moist.     Pharynx: No posterior oropharyngeal erythema.  Eyes:  Extraocular Movements: Extraocular movements intact.     Conjunctiva/sclera: Conjunctivae normal.     Pupils: Pupils are equal, round, and reactive to light.  Cardiovascular:     Rate and Rhythm: Normal rate and regular rhythm.     Pulses: Normal pulses.     Heart sounds: Normal heart sounds.  Pulmonary:     Effort: Pulmonary effort is normal. No respiratory distress.     Breath sounds: Normal breath sounds. No stridor. No wheezing, rhonchi or rales.  Abdominal:     General: Abdomen is flat. Bowel sounds are normal.     Palpations: Abdomen is soft.  Musculoskeletal:        General: Normal range of motion.     Cervical back: Normal range of motion.  Skin:    General: Skin is warm and dry.  Neurological:     General: No focal deficit present.     Mental Status: She is alert and oriented to person, place, and time. Mental status is at baseline.  Psychiatric:        Mood and Affect: Mood normal.        Behavior: Behavior normal.      UC Treatments / Results  Labs (all labs ordered are listed, but only abnormal results are displayed) Labs Reviewed - No data to display  EKG   Radiology No results found.  Procedures Procedures (including critical care time)  Medications Ordered in UC Medications - No data to display  Initial Impression / Assessment and Plan / UC Course  I have reviewed the triage vital signs and the nursing notes.  Pertinent labs & imaging results that were available during my care of the patient were reviewed by me and considered in my medical decision making (see chart for details).     Patient presents with symptoms likely from a viral upper respiratory infection. Patient is nontoxic appearing and  not in need of emergent medical intervention.  There are no adventitious lung sounds on exam or signs of respiratory compromise so do not think that chest imaging is necessary.  Advised patient to continue albuterol nebulizer use as needed.  Will prescribe prednisone to decrease inflammation and help with cough.  Suspect possible mild asthma exacerbation.  Patient took 40 mg prednisone today so will prescribe 40 mg daily for 4 days.  Advised patient to not take this until tomorrow.  Cough medication also prescribed for patient.  Blood pressure is mildly elevated but patient reports it is typically 110s on a normal basis.  She reports that the Mucinex made her blood pressure go up.  Advised patient to monitor this closely and follow-up with PCP or urgent care if it remains elevated.  Advised strict return precautions if any symptoms persist or worsen.  Patient verbalized understanding and was agreeable with plan. Final Clinical Impressions(s) / UC Diagnoses   Final diagnoses:  Viral upper respiratory tract infection with cough     Discharge Instructions      I have prescribed you prednisone and a cough medication.  Start taking prednisone tomorrow given that you had a dose today.  Follow-up if any symptoms persist or worsen.    ED Prescriptions     Medication Sig Dispense Auth. Provider   predniSONE (DELTASONE) 20 MG tablet Take 2 tablets (40 mg total) by mouth daily for 4 days. 8 tablet [place], [place] E, [place]   benzonatate (TESSALON) 100 MG capsule Take 1 capsule (100 mg total) by mouth every 8 (eight) hours as needed  for cough. 21 capsule [place], Michele Rockers, [place]      PDMP not reviewed this encounter.   [name], [place] 05/08/22 [phone]

## 2022-05-08 NOTE — Discharge Instructions (Signed)
I have prescribed you prednisone and a cough medication.  Start taking prednisone tomorrow given that you had a dose today.  Follow-up if any symptoms persist or worsen.

## 2022-05-08 NOTE — ED Triage Notes (Signed)
Pt c/o nasal drainage, headache, wheezing  Reports taking a breathing tx and prednisone this morning   Onset ~ sat  States this happens every year

## 2022-08-11 ENCOUNTER — Emergency Department (HOSPITAL_BASED_OUTPATIENT_CLINIC_OR_DEPARTMENT_OTHER)
Admission: EM | Admit: 2022-08-11 | Discharge: 2022-08-11 | Disposition: A | Discharge status: Home / Self Care | Payer: BC Managed Care – PPO | Source: Home / Self Care | Attending: Emergency Medicine | Admitting: Emergency Medicine

## 2022-08-11 ENCOUNTER — Other Ambulatory Visit: Payer: Self-pay

## 2022-08-11 ENCOUNTER — Encounter (HOSPITAL_BASED_OUTPATIENT_CLINIC_OR_DEPARTMENT_OTHER): Payer: Self-pay | Admitting: Emergency Medicine

## 2022-08-11 ENCOUNTER — Emergency Department (HOSPITAL_COMMUNITY)
Admission: EM | Admit: 2022-08-11 | Discharge: 2022-08-11 | Discharge status: Against Medical Advice | Payer: BC Managed Care – PPO | Attending: Emergency Medicine | Admitting: Emergency Medicine

## 2022-08-11 ENCOUNTER — Emergency Department (HOSPITAL_BASED_OUTPATIENT_CLINIC_OR_DEPARTMENT_OTHER): Payer: BC Managed Care – PPO

## 2022-08-11 DIAGNOSIS — Y92410 Unspecified street and highway as the place of occurrence of the external cause: Secondary | ICD-10-CM | POA: Insufficient documentation

## 2022-08-11 DIAGNOSIS — S7002XA Contusion of left hip, initial encounter: Secondary | ICD-10-CM | POA: Insufficient documentation

## 2022-08-11 DIAGNOSIS — Z91040 Latex allergy status: Secondary | ICD-10-CM | POA: Insufficient documentation

## 2022-08-11 DIAGNOSIS — Z5321 Procedure and treatment not carried out due to patient leaving prior to being seen by health care provider: Secondary | ICD-10-CM | POA: Insufficient documentation

## 2022-08-11 DIAGNOSIS — S20211A Contusion of right front wall of thorax, initial encounter: Secondary | ICD-10-CM

## 2022-08-11 DIAGNOSIS — M62838 Other muscle spasm: Secondary | ICD-10-CM | POA: Insufficient documentation

## 2022-08-11 DIAGNOSIS — S40212A Abrasion of left shoulder, initial encounter: Secondary | ICD-10-CM | POA: Insufficient documentation

## 2022-08-11 MED ORDER — LIDOCAINE 5 % EX PTCH
1.0000 | MEDICATED_PATCH | CUTANEOUS | Status: DC
  Administered 2022-08-11: 1 via TRANSDERMAL
  Filled 2022-08-11: qty 1

## 2022-08-11 MED ORDER — ACETAMINOPHEN 325 MG PO TABS
650.0000 mg | ORAL_TABLET | Freq: Once | ORAL | Status: AC
  Administered 2022-08-11: 650 mg via ORAL
  Filled 2022-08-11: qty 2

## 2022-08-11 MED ORDER — LIDOCAINE 5 % EX PTCH: 1.0000 | MEDICATED_PATCH | CUTANEOUS | 0 refills | Status: DC
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
## 2022-08-11 NOTE — ED Notes (Signed)
Patient informed registration staff member that she was leaving department due to wait times.

## 2022-08-11 NOTE — Discharge Instructions (Addendum)
X-rays did not show any fractures or broken bones.  I do suspect that you have bone bruises/contusions of your ribs.  I suspect you will develop muscle spasms and some of the bigger back muscles.  I recommend 600 mg ibuprofen every 8 hours as needed for pain.  Recommend 1000 mg of Tylenol every 6 hours as needed for pain.  Consider buying lidocaine patches over-the-counter or fill prescription for lidocaine patches.
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
## 2022-08-11 NOTE — ED Notes (Signed)
ED Provider at bedside. 
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
## 2022-08-20 ENCOUNTER — Ambulatory Visit
Admission: EM | Admit: 2022-08-20 | Discharge: 2022-08-20 | Disposition: A | Discharge status: Home / Self Care | Payer: BC Managed Care – PPO

## 2022-08-20 DIAGNOSIS — R0781 Pleurodynia: Secondary | ICD-10-CM | POA: Diagnosis not present

## 2022-08-20 DIAGNOSIS — M545 Low back pain, unspecified: Secondary | ICD-10-CM

## 2022-08-20 DIAGNOSIS — Z8719 Personal history of other diseases of the digestive system: Secondary | ICD-10-CM | POA: Insufficient documentation

## 2022-08-20 MED ORDER — METHOCARBAMOL 500 MG PO TABS: 500.0000 mg | ORAL_TABLET | Freq: Two times a day (BID) | ORAL | 0 refills | Status: DC | PRN
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
## 2022-08-20 NOTE — Discharge Instructions (Signed)
I have prescribed your muscle relaxer to take as needed.  Please be advised that it can make you drowsy so no driving or drinking alcohol with it.  Follow-up with orthopedist if symptoms persist or worsen.
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
## 2023-02-03 ENCOUNTER — Ambulatory Visit
Admission: EM | Admit: 2023-02-03 | Discharge: 2023-02-03 | Disposition: A | Discharge status: Home / Self Care | Payer: BC Managed Care – PPO | Attending: Family Medicine | Admitting: Family Medicine

## 2023-02-03 DIAGNOSIS — J019 Acute sinusitis, unspecified: Secondary | ICD-10-CM | POA: Diagnosis not present

## 2023-02-03 DIAGNOSIS — J4521 Mild intermittent asthma with (acute) exacerbation: Secondary | ICD-10-CM

## 2023-02-03 MED ORDER — METHYLPREDNISOLONE ACETATE 80 MG/ML IJ SUSP
80.0000 mg | Freq: Once | INTRAMUSCULAR | Status: AC
  Administered 2023-02-03: 80 mg via INTRAMUSCULAR

## 2023-02-03 MED ORDER — ALBUTEROL SULFATE (2.5 MG/3ML) 0.083% IN NEBU: 2.5000 mg | INHALATION_SOLUTION | RESPIRATORY_TRACT | 0 refills | Status: DC | PRN

## 2023-02-03 MED ORDER — PREDNISONE 20 MG PO TABS: 40.0000 mg | ORAL_TABLET | Freq: Every day | ORAL | 0 refills | Status: AC

## 2023-02-03 MED ORDER — DOXYCYCLINE HYCLATE 100 MG PO CAPS: 100.0000 mg | ORAL_CAPSULE | Freq: Two times a day (BID) | ORAL | 0 refills | Status: AC

## 2023-02-03 NOTE — ED Provider Notes (Signed)
[facility]    CSN: [id number] Arrival date & time: 02/03/23  1804      History   Chief Complaint Chief Complaint  Patient presents with   Cough    HPI Victoria Booth is a 42 y.o. female.    Cough  Here for cough and congestion and wheezing that is been going on for 3 weeks.  Now her right cheek and sinus and ear are bothering her some to  She has a long history of asthma that is intermittent.  She is allergic to Keflex, sulfa, and penicillins  Last menstrual cycle was 2 weeks ago. Past Medical History:  Diagnosis Date   Anemia    Asthma    Crohn disease (HCC)    Hypertension    LUQ abdominal pain 10/16/2017   Microcytic anemia 12/01/2014   Obesity     Patient Active Problem List   Diagnosis Date Noted   History of IBS 08/20/2022   Anxiety 03/04/2019   Hypertensive urgency 12/14/2018   Vitamin D deficiency 08/04/2018   Nephrolithiasis 07/14/2018   Functional dyspepsia 02/20/2018   IBS (irritable colon syndrome) 02/20/2018   Chronic idiopathic constipation 11/13/2017   Acute pain of left shoulder 08/18/2017   Iron deficiency anemia 08/18/2017   Insomnia 07/29/2017   Left lower quadrant abdominal pain of unknown etiology 07/13/2017   OSA (obstructive sleep apnea) 06/05/2017   Major depressive disorder 03/21/2017   Migraine 10/24/2016   Hematuria 09/24/2016   Prediabetes 08/21/2016   Allergic rhinitis 06/18/2016   Left flank pain 06/05/2016   Anemia 04/08/2016   Disorder of thyroid gland 04/08/2016   Headache disorder 04/05/2016   PCOS (polycystic ovarian syndrome) 04/05/2016   Benign essential hypertension 12/01/2014   Gastroesophageal reflux disease 12/01/2014   Preventative health care 12/01/2014   Hypertension 02/11/2014   Asthma 02/18/2011    Past Surgical History:  Procedure Laterality Date   EXTRACORPOREAL SHOCK WAVE LITHOTRIPSY Left 07/16/2018   Procedure: EXTRACORPOREAL SHOCK WAVE LITHOTRIPSY (ESWL);  Surgeon: Vanna Scotland, MD;  Location: [facility] ORS;  Service: Urology;  Laterality: Left;   TOENAIL EXCISION      OB History   No obstetric history on file.      Home Medications    Prior to Admission medications   Medication Sig Start Date End Date Taking? Authorizing Provider  albuterol (PROVENTIL) (2.5 MG/3ML) 0.083% nebulizer solution Take 3 mLs (2.5 mg total) by nebulization every 4 (four) hours as needed for wheezing or shortness of breath. 02/03/23  Yes Zenia Resides, MD  doxycycline (VIBRAMYCIN) 100 MG capsule Take 1 capsule (100 mg total) by mouth 2 (two) times daily for 7 days. 02/03/23 02/10/23 Yes Zenia Resides, MD  predniSONE (DELTASONE) 20 MG tablet Take 2 tablets (40 mg total) by mouth daily with breakfast for 5 days. 02/03/23 02/08/23 Yes Mateja Dier, Janace Aris, MD  acetaminophen (TYLENOL) 500 MG tablet Take 1,000 mg by mouth every 6 (six) hours as needed for headache. Patient not taking: Reported on 06/12/2021    [provider]  albuterol (VENTOLIN HFA) 108 (90 Base) MCG/ACT inhaler Inhale 2 puffs into the lungs every 6 (six) hours as needed for wheezing or shortness of breath. 02/16/19   Levora Dredge, MD  amLODipine (NORVASC) 10 MG tablet Take 10 mg by mouth daily.    [provider]  amLODIPine-Valsartan-HCTZ 10-160-25 MG TABS Take 1 tablet by mouth daily. 09/07/19   [provider]    Family History Family History  Problem Relation  Age of Onset   Diabetes Mother    Hypertension Mother    Heart attack Mother        2016- 72 yrs   Breast cancer Maternal Grandmother    Hypertension Father    Colon cancer Neg Hx    Stomach cancer Neg Hx    Pancreatic cancer Neg Hx     Social History Social History   Tobacco Use   Smoking status: Former    Current packs/day: 0.00    Types: Cigarettes    Quit date: 04/06/2015    Years since quitting: 7.8   Smokeless tobacco: Never  Vaping Use   Vaping status: Never Used  Substance Use Topics   Alcohol use:  Yes    Comment: occ   Drug use: No     Allergies   Cephalexin, Lisinopril, Penicillins, Latex, Shellfish allergy, Sulfa antibiotics, and Iodine   Review of Systems Review of Systems  Respiratory:  Positive for cough.      Physical Exam Triage Vital Signs ED Triage Vitals  Encounter Vitals Group     BP 02/03/23 1956 131/85     Systolic BP Percentile --      Diastolic BP Percentile --      Pulse Rate 02/03/23 1956 81     Resp 02/03/23 1956 18     Temp 02/03/23 1956 98.1 F (36.7 C)     Temp Source 02/03/23 1956 Oral     SpO2 02/03/23 1956 95 %     Weight --      Height --      Head Circumference --      Peak Flow --      Pain Score 02/03/23 1957 9     Pain Loc --      Pain Education --      Exclude from Growth Chart --    No data found.  Updated Vital Signs BP 131/85 (BP Location: Right Arm)   Pulse 81   Temp 98.1 F (36.7 C) (Oral)   Resp 18   SpO2 95%   Visual Acuity Right Eye Distance:   Left Eye Distance:   Bilateral Distance:    Right Eye Near:   Left Eye Near:    Bilateral Near:     Physical Exam Vitals reviewed.  Constitutional:      General: She is not in acute distress.    Appearance: She is not ill-appearing, toxic-appearing or diaphoretic.  HENT:     Right Ear: Tympanic membrane and ear canal normal.     Left Ear: Tympanic membrane and ear canal normal.     Nose: Congestion present.     Mouth/Throat:     Mouth: Mucous membranes are moist.     Pharynx: No oropharyngeal exudate or posterior oropharyngeal erythema.  Eyes:     Extraocular Movements: Extraocular movements intact.     Conjunctiva/sclera: Conjunctivae normal.     Pupils: Pupils are equal, round, and reactive to light.  Cardiovascular:     Rate and Rhythm: Normal rate and regular rhythm.     Heart sounds: No murmur heard. Pulmonary:     Effort: No respiratory distress.     Breath sounds: No stridor. No wheezing, rhonchi or rales.  Musculoskeletal:     Cervical back:  Neck supple.  Lymphadenopathy:     Cervical: No cervical adenopathy.  Skin:    Capillary Refill: Capillary refill takes less than 2 seconds.     Coloration: Skin is not jaundiced or  pale.  Neurological:     General: No focal deficit present.     Mental Status: She is alert and oriented to person, place, and time.  Psychiatric:        Behavior: Behavior normal.      UC Treatments / Results  Labs (all labs ordered are listed, but only abnormal results are displayed) Labs Reviewed - No data to display  EKG   Radiology No results found.  Procedures Procedures (including critical care time)  Medications Ordered in UC Medications  methylPREDNISolone acetate (DEPO-MEDROL) injection 80 mg (80 mg Intramuscular Given 02/03/23 2027)    Initial Impression / Assessment and Plan / UC Course  I have reviewed the triage vital signs and the nursing notes.  Pertinent labs & imaging results that were available during my care of the patient were reviewed by me and considered in my medical decision making (see chart for details).     Depo-Medrol is given here and prednisone is sent to the pharmacy along with albuterol for asthma exacerbation  Doxycycline is sent in for acute sinusitis. Final Clinical Impressions(s) / UC Diagnoses   Final diagnoses:  Mild intermittent asthma with exacerbation  Acute sinusitis, recurrence not specified, unspecified location     Discharge Instructions      You have been given an injection of methylprednisolone 80 mg, steroid.  Take prednisone 20 mg--2 daily for 5 days  Take doxycycline 100 mg --1 capsule 2 times daily for 7 days  Use your albuterol in the nebulizer every 4 hours as needed    ED Prescriptions     Medication Sig Dispense Auth. Provider   albuterol (PROVENTIL) (2.5 MG/3ML) 0.083% nebulizer solution Take 3 mLs (2.5 mg total) by nebulization every 4 (four) hours as needed for wheezing or shortness of breath. 225 mL Zenia Resides, MD   doxycycline (VIBRAMYCIN) 100 MG capsule Take 1 capsule (100 mg total) by mouth 2 (two) times daily for 7 days. 14 capsule Zenia Resides, MD   predniSONE (DELTASONE) 20 MG tablet Take 2 tablets (40 mg total) by mouth daily with breakfast for 5 days. 10 tablet Marlinda Mike Janace Aris, MD      PDMP not reviewed this encounter.   Zenia Resides, MD 02/03/23 2032

## 2023-02-03 NOTE — Discharge Instructions (Signed)
You have been given an injection of methylprednisolone 80 mg, steroid.  Take prednisone 20 mg--2 daily for 5 days  Take doxycycline 100 mg --1 capsule 2 times daily for 7 days  Use your albuterol in the nebulizer every 4 hours as needed

## 2023-02-03 NOTE — ED Triage Notes (Signed)
Pt c/o cough, post nasal drip, and wheezing x3 weeks. States sx's became worse last week. States using inhaler and neb tx's with no relief.

## 2023-10-26 ENCOUNTER — Ambulatory Visit: Admission: EM | Admit: 2023-10-26 | Discharge: 2023-10-26 | Disposition: A | Discharge status: Home / Self Care

## 2023-10-26 DIAGNOSIS — E282 Polycystic ovarian syndrome: Secondary | ICD-10-CM | POA: Insufficient documentation

## 2023-10-26 DIAGNOSIS — J45901 Unspecified asthma with (acute) exacerbation: Secondary | ICD-10-CM

## 2023-10-26 DIAGNOSIS — N915 Oligomenorrhea, unspecified: Secondary | ICD-10-CM | POA: Insufficient documentation

## 2023-10-26 DIAGNOSIS — K5090 Crohn's disease, unspecified, without complications: Secondary | ICD-10-CM | POA: Insufficient documentation

## 2023-10-26 DIAGNOSIS — I10 Essential (primary) hypertension: Secondary | ICD-10-CM | POA: Insufficient documentation

## 2023-10-26 MED ORDER — DOXYCYCLINE HYCLATE 100 MG PO CAPS: 100.0000 mg | ORAL_CAPSULE | Freq: Two times a day (BID) | ORAL | 0 refills | Status: AC

## 2023-10-26 MED ORDER — PREDNISONE 20 MG PO TABS: 40.0000 mg | ORAL_TABLET | Freq: Every day | ORAL | 0 refills | Status: AC

## 2023-10-26 NOTE — ED Triage Notes (Signed)
 Patient reports starting about 10 days ago with sinus congestion/runny nose, cough that all got some better but this past Friday began wheezing and tightness with breathing. Has not reached out to PCP yet.

## 2023-10-26 NOTE — ED Provider Notes (Signed)
 [facility]    CSN: [id number] Arrival date & time: 10/26/23  0936      History   Chief Complaint Chief Complaint  Patient presents with   Asthma Exacerbation    HPI Victoria Booth is a 43 y.o. female.   Patient here today for evaluation of some tightness in chest after 10 day upper respiratory illness. She has had congestion and sinus pressure. She denies fever. She does have known asthma at baseline.   The history is provided by the patient.    Past Medical History:  Diagnosis Date   Anemia    Asthma    Crohn disease (HCC)    Hypertension    LUQ abdominal pain 10/16/2017   Microcytic anemia 12/01/2014   Obesity     Patient Active Problem List   Diagnosis Date Noted   Polycystic ovary syndrome 10/26/2023   Hypertensive disorder 10/26/2023   Oligomenorrhea 10/26/2023   Crohn's disease (HCC) 10/26/2023   History of IBS 08/20/2022   Secondary amenorrhea 10/05/2021   Anxiety 03/04/2019   Hypertensive urgency 12/14/2018   Vitamin D  deficiency 08/04/2018   Nephrolithiasis 07/14/2018   Functional dyspepsia 02/20/2018   IBS (irritable colon syndrome) 02/20/2018   Chronic idiopathic constipation 11/13/2017   Acute pain of left shoulder 08/18/2017   Iron  deficiency anemia 08/18/2017   Insomnia 07/29/2017   Left lower quadrant abdominal pain of unknown etiology 07/13/2017   OSA (obstructive sleep apnea) 06/05/2017   Major depressive disorder 03/21/2017   Migraine 10/24/2016   Hematuria 09/24/2016   Prediabetes 08/21/2016   Allergic rhinitis 06/18/2016   Left flank pain 06/05/2016   Anemia 04/08/2016   Disorder of thyroid  gland 04/08/2016   Headache disorder 04/05/2016   PCOS (polycystic ovarian syndrome) 04/05/2016   Benign essential hypertension 12/01/2014   Gastroesophageal reflux disease 12/01/2014   Preventative health care 12/01/2014   Hypertension 02/11/2014   Asthma 02/18/2011    Past Surgical History:  Procedure Laterality Date    EXTRACORPOREAL SHOCK WAVE LITHOTRIPSY Left 07/16/2018   Procedure: EXTRACORPOREAL SHOCK WAVE LITHOTRIPSY (ESWL);  Surgeon: Penne Knee, MD;  Location: [facility] OR[facility];  Service: Urology;  Laterality: Left;   TOENAIL EXCISION      OB History   No obstetric history on file.      Home Medications    Prior to Admission medications   Medication Sig Start Date End Date Taking? Authorizing Provider  atenolol-chlorthalidone  (TENORETIC) 50-25 MG tablet Take 1 tablet by mouth daily.   Yes [provider]  doxycycline  (VIBRAMYCIN ) 100 MG capsule Take 1 capsule (100 mg total) by mouth 2 (two) times daily for 7 days. 10/26/23 11/02/23 Yes Billy Asberry FALCON, PA-C  albuterol  (PROVENTIL ) (2.5 MG/3ML) 0.083% nebulizer solution Take 3 mLs (2.5 mg total) by nebulization every 6 (six) hours as needed for wheezing or shortness of breath. 10/27/23   Midge Golas, MD    Family History Family History  Problem Relation Age of Onset   Diabetes Mother    Hypertension Mother    Heart attack Mother        2016- 86 yrs   Breast cancer Maternal Grandmother    Hypertension Father    Colon cancer Neg Hx    Stomach cancer Neg Hx    Pancreatic cancer Neg Hx     Social History Social History   Tobacco Use   Smoking status: Former    Current packs/day: 0.00    Types: Cigarettes    Quit date: 04/06/2015  Years since quitting: 8.5   Smokeless tobacco: Never  Vaping Use   Vaping status: Never Used  Substance Use Topics   Alcohol use: Yes    Comment: Rarely.   Drug use: No     Allergies   Cephalexin , Lisinopril , Penicillins, Povidone iodine, Shellfish allergy, Latex, and Sulfa antibiotics   Review of Systems Review of Systems  Constitutional:  Negative for chills and fever.  HENT:  Positive for congestion and sinus pressure. Negative for ear pain.   Eyes:  Negative for discharge and redness.  Respiratory:  Positive for cough. Negative for shortness of breath and wheezing.    Gastrointestinal:  Negative for abdominal pain, diarrhea, nausea and vomiting.     Physical Exam Triage Vital Signs ED Triage Vitals  Encounter Vitals Group     BP 10/26/23 1141 (!) 149/83     Girls Systolic BP Percentile --      Girls Diastolic BP Percentile --      Boys Systolic BP Percentile --      Boys Diastolic BP Percentile --      Pulse Rate 10/26/23 1141 76     Resp 10/26/23 1141 20     Temp 10/26/23 1141 98.4 F (36.9 C)     Temp Source 10/26/23 1141 Oral     SpO2 10/26/23 1141 98 %     Weight 10/26/23 1137 287 lb (130.2 kg)     Height 10/26/23 1137 5' 6.5 (1.689 m)     Head Circumference --      Peak Flow --      Pain Score 10/26/23 1135 0     Pain Loc --      Pain Education --      Exclude from Growth Chart --    No data found.  Updated Vital Signs BP (!) 149/83 (BP Location: Left Arm)   Pulse 76   Temp 98.4 F (36.9 C) (Oral)   Resp 20   Ht 5' 6.5 (1.689 m)   Wt 287 lb (130.2 kg)   LMP 10/17/2023 (Approximate)   SpO2 98%   BMI 45.63 kg/m   Visual Acuity Right Eye Distance:   Left Eye Distance:   Bilateral Distance:    Right Eye Near:   Left Eye Near:    Bilateral Near:     Physical Exam Vitals and nursing note reviewed.  Constitutional:      General: She is not in acute distress.    Appearance: Normal appearance. She is not ill-appearing.  HENT:     Head: Normocephalic and atraumatic.     Nose: Congestion present.     Mouth/Throat:     Mouth: Mucous membranes are moist.     Pharynx: No oropharyngeal exudate or posterior oropharyngeal erythema.  Eyes:     Conjunctiva/sclera: Conjunctivae normal.  Cardiovascular:     Rate and Rhythm: Normal rate and regular rhythm.     Heart sounds: Normal heart sounds. No murmur heard. Pulmonary:     Effort: Pulmonary effort is normal. No respiratory distress.     Breath sounds: Normal breath sounds. No wheezing, rhonchi or rales.  Skin:    General: Skin is warm and dry.  Neurological:      Mental Status: She is alert.  Psychiatric:        Mood and Affect: Mood normal.        Thought Content: Thought content normal.      UC Treatments / Results  Labs (all labs ordered are listed,  but only abnormal results are displayed) Labs Reviewed - No data to display  EKG   Radiology No results found.  Procedures Procedures (including critical care time)  Medications Ordered in UC Medications - No data to display  Initial Impression / Assessment and Plan / UC Course  I have reviewed the triage vital signs and the nursing notes.  Pertinent labs & imaging results that were available during my care of the patient were reviewed by me and considered in my medical decision making (see chart for details).    Will treat to cover asthma exacerbation as well as possible sinusitis given duration of symptoms with steroid burst and antibiotic. Encouraged follow up if no gradual improvement or with any further concerns.   Final Clinical Impressions(s) / UC Diagnoses   Final diagnoses:  Asthma with acute exacerbation, unspecified asthma severity, unspecified whether persistent   Discharge Instructions   None    ED Prescriptions     Medication Sig Dispense Auth. Provider   predniSONE  (DELTASONE ) 20 MG tablet Take 2 tablets (40 mg total) by mouth daily with breakfast for 5 days. 10 tablet Billy Stabs F, PA-C   doxycycline  (VIBRAMYCIN ) 100 MG capsule Take 1 capsule (100 mg total) by mouth 2 (two) times daily for 7 days. 14 capsule Billy Stabs FALCON, PA-C      PDMP not reviewed this encounter.   Billy Stabs FALCON, PA-C 11/02/23 1247

## 2023-10-27 ENCOUNTER — Encounter (HOSPITAL_COMMUNITY): Payer: Self-pay

## 2023-10-27 ENCOUNTER — Emergency Department (HOSPITAL_COMMUNITY)
Admission: EM | Admit: 2023-10-27 | Discharge: 2023-10-27 | Disposition: A | Discharge status: Home / Self Care | Attending: Emergency Medicine | Admitting: Emergency Medicine

## 2023-10-27 ENCOUNTER — Other Ambulatory Visit: Payer: Self-pay

## 2023-10-27 DIAGNOSIS — I10 Essential (primary) hypertension: Secondary | ICD-10-CM | POA: Diagnosis not present

## 2023-10-27 DIAGNOSIS — R059 Cough, unspecified: Secondary | ICD-10-CM | POA: Diagnosis present

## 2023-10-27 DIAGNOSIS — R0789 Other chest pain: Secondary | ICD-10-CM | POA: Diagnosis not present

## 2023-10-27 DIAGNOSIS — J45909 Unspecified asthma, uncomplicated: Secondary | ICD-10-CM | POA: Insufficient documentation

## 2023-10-27 DIAGNOSIS — Z91040 Latex allergy status: Secondary | ICD-10-CM | POA: Diagnosis not present

## 2023-10-27 LAB — BASIC METABOLIC PANEL WITH GFR
Anion gap: 13 (ref 5–15)
BUN: 7 mg/dL (ref 6–20)
CO2: 19 mmol/L — ABNORMAL LOW (ref 22–32)
Calcium: 9.5 mg/dL (ref 8.9–10.3)
Chloride: 105 mmol/L (ref 98–111)
Creatinine, Ser: 0.61 mg/dL (ref 0.44–1.00)
GFR, Estimated: >60 mL/min (ref >60)
Glucose, Bld: 94 mg/dL (ref 70–99)
Potassium: 4 mmol/L (ref 3.5–5.1)
Sodium: 136 mmol/L (ref 135–145)

## 2023-10-27 LAB — HCG, SERUM, QUALITATIVE: Preg, Serum: NEGATIVE

## 2023-10-27 MED ORDER — ALBUTEROL SULFATE (2.5 MG/3ML) 0.083% IN NEBU: 2.5000 mg | INHALATION_SOLUTION | Freq: Four times a day (QID) | RESPIRATORY_TRACT | 12 refills | Status: AC | PRN

## 2023-10-27 MED ORDER — ALBUTEROL SULFATE HFA 108 (90 BASE) MCG/ACT IN AERS
2.0000 | INHALATION_SPRAY | Freq: Once | RESPIRATORY_TRACT | Status: AC
  Administered 2023-10-27: 2 via RESPIRATORY_TRACT
  Filled 2023-10-27: qty 6.7

## 2023-10-27 MED ORDER — IPRATROPIUM-ALBUTEROL 0.5-2.5 (3) MG/3ML IN SOLN
3.0000 mL | Freq: Once | RESPIRATORY_TRACT | Status: AC
  Administered 2023-10-27: 3 mL via RESPIRATORY_TRACT
  Filled 2023-10-27: qty 3

## 2023-10-27 NOTE — ED Triage Notes (Signed)
 Pt presents to ED from PCP. Pt reports being seen at [facility] yesterday, given meds for asthma exacerbation. Followed up today with PCP who did an EKG and CXR, also found to be hypertensive. Per pt, PCP requested she come to ED for chest CT d/t spot in lung and also to treat hypertension.   Has not taken home BP meds in 2 weeks

## 2023-10-27 NOTE — Discharge Instructions (Signed)

## 2023-10-27 NOTE — ED Provider Notes (Signed)
 [place] EMERGENCY DEPARTMENT AT [facility] Provider Note   CSN: [id number] Arrival date & time: 10/27/23  1259     Patient presents with: Hypertension   Victoria Booth is a 43 y.o. female.   The history is provided by the patient and a significant other.  Patient history of asthma, hypertension presents for multiple complaints.  She reports for up to 2 weeks she has had cough and congestion and upper respiratory infection. She was seen yesterday in urgent care and diagnosed with asthma exacerbation and given steroids and doxycycline . She reports continued cough and chest tightness but denies any chest pain, no shortness of breath.  No hemoptysis.  She is a non-smoker.  She went to her PCP for follow-up who obtained a chest x-ray they told her to go to the ER because she had hilar lymphadenopathy Patient reports her PCP told her she might have cancer and needs to have a CT scan  Patient reports feeling very anxious and her blood pressure is elevated.  She also reports not taking BP meds for 2 weeks.  No previous history of CAD/VTE. No recent travel or surgery.  She is not on estrogen products    Past Medical History:  Diagnosis Date   Anemia    Asthma    Crohn disease (HCC)    Hypertension    LUQ abdominal pain 10/16/2017   Microcytic anemia 12/01/2014   Obesity     Prior to Admission medications   Medication Sig Start Date End Date Taking? Authorizing Provider  albuterol  (PROVENTIL ) (2.5 MG/3ML) 0.083% nebulizer solution Take 3 mLs (2.5 mg total) by nebulization every 6 (six) hours as needed for wheezing or shortness of breath. 10/27/23  Yes Midge Golas, MD  atenolol-chlorthalidone  (TENORETIC) 50-25 MG tablet Take 1 tablet by mouth daily.    [provider]  doxycycline  (VIBRAMYCIN ) 100 MG capsule Take 1 capsule (100 mg total) by mouth 2 (two) times daily for 7 days. 10/26/23 11/02/23  Billy Asberry FALCON, PA-C  predniSONE  (DELTASONE ) 20 MG tablet  Take 2 tablets (40 mg total) by mouth daily with breakfast for 5 days. 10/26/23 10/31/23  Billy Asberry FALCON, PA-C    Allergies: Cephalexin , Lisinopril , Penicillins, Povidone iodine, Shellfish allergy, Latex, and Sulfa antibiotics    Review of Systems  Constitutional:  Negative for fever.  Respiratory:  Positive for cough. Negative for shortness of breath.   Cardiovascular:  Positive for chest pain.  Gastrointestinal:  Negative for vomiting.    Updated Vital Signs BP (!) 155/95   Pulse (!) 58   Temp (!) 97.5 F (36.4 C) (Oral)   Resp 18   LMP 10/17/2023 (Approximate)   SpO2 100%   Physical Exam CONSTITUTIONAL: Well developed/well nourished, anxious HEAD: Normocephalic/atraumatic ENMT: Mucous membranes moist NECK: supple no meningeal signs CV: S1/S2 noted, no murmurs/rubs/gallops noted LUNGS: Lungs are clear to auscultation bilaterally, no apparent distress ABDOMEN: soft, nontender NEURO: Pt is awake/alert/appropriate, moves all extremitiesx4.  No facial droop.   EXTREMITIES: pulses normal/equal, full ROM, no lower extreme edema or tenderness SKIN: warm, color normal PSYCH: Anxious (all labs ordered are listed, but only abnormal results are displayed) Labs Reviewed  BASIC METABOLIC PANEL WITH GFR - Abnormal; Notable for the following components:      Result Value   CO2 19 (*)    All other components within normal limits  HCG, SERUM, QUALITATIVE    EKG: EKG Interpretation Date/Time:  Monday October 27 2023 13:13:33 EDT Ventricular Rate:  50 PR  Interval:  185 QRS Duration:  77 QT Interval:  412 QTC Calculation: 376 R Axis:   8  Text Interpretation: Sinus rhythm Low voltage, precordial leads Consider anterior infarct No significant change since last tracing Confirmed by Midge Golas ([id number]) on 10/27/2023 2:01:42 PM  Radiology: No results found.   Procedures   Medications Ordered in the ED  ipratropium-albuterol  (DUONEB) 0.5-2.5 (3) MG/3ML nebulizer solution 3  mL (3 mLs Nebulization Given 10/27/23 1434)  albuterol  (VENTOLIN  HFA) 108 (90 Base) MCG/ACT inhaler 2 puff (2 puffs Inhalation Given 10/27/23 1433)    Clinical Course as of 10/27/23 1459  Mon Oct 27, 2023  1447 CO2(!): 19 Mild dehydration [DW]    Clinical Course User Index [DW] Midge Golas, MD                                 Medical Decision Making Amount and/or Complexity of Data Reviewed Labs: ordered. Decision-making details documented in ED Course.  Risk Prescription drug management.   This patient presents to the ED for concern of chest tightness, this involves an extensive number of treatment options, and is a complaint that carries with it a high risk of complications and morbidity.  The differential diagnosis includes but is not limited to acute coronary syndrome, aortic dissection, pulmonary embolism, pericarditis, pneumothorax, pneumonia, myocarditis, pleurisy, esophageal rupture   Comorbidities that complicate the patient evaluation: Patient's presentation is complicated by their history of asthma and hypertension  Additional history obtained: Additional history obtained from significant other Records reviewed urgent care notes reviewed  Lab Tests: I Ordered, and personally interpreted labs.  The pertinent results include: Mild dehydration  Medicines ordered and prescription drug management: I ordered medication including albuterol  for cough Reevaluation of the patient after these medicines showed that the patient    improved  Test Considered: Patient is low risk / negative by [facility] criteria, therefore do not feel that PE workup or CT chest is indicated.  Reevaluation: After the interventions noted above, I reevaluated the patient and found that they have :improved  Complexity of problems addressed: Patient's presentation is most consistent with  acute complicated illness/injury requiring diagnostic workup  Disposition: After consideration of the  diagnostic results and the patient's response to treatment,  I feel that the patent would benefit from discharge  .    Patient presented for multiple complaints, mostly concerned about her elevated blood pressure for which she has not taken medicines in 2 weeks, and her PCP made her upset when she told her she may have cancer and told her to go to ER for CT chest She had the paper results of her x-ray which revealed hilar lymphadenopathy but no other acute findings I advised the differential was large for this condition, and would recommend finishing a course of antibiotics before any further imaging She otherwise appears low risk for cancer given she is a non-smoker Patient is agreeable with plan.  She will complete her therapy, follow with PCP for potential repeat x-ray or CT chest No indication for emergent CT imaging at this time  Patient will start taking her BP meds.  Albuterol  given at her request.  She appears low risk for PE, will defer further workup.     Final diagnoses:  Primary hypertension  Chest tightness    ED Discharge Orders          Ordered    albuterol  (PROVENTIL ) (2.5 MG/3ML) 0.083% nebulizer solution  Every 6 hours PRN        10/27/23 1450               Midge Golas, MD 10/27/23 1500

## 2023-10-28 ENCOUNTER — Other Ambulatory Visit: Payer: Self-pay | Admitting: Physician Assistant

## 2023-10-28 ENCOUNTER — Encounter: Payer: Self-pay | Admitting: Physician Assistant

## 2023-10-28 DIAGNOSIS — R911 Solitary pulmonary nodule: Secondary | ICD-10-CM

## 2023-10-30 ENCOUNTER — Other Ambulatory Visit: Payer: Self-pay

## 2023-10-30 ENCOUNTER — Emergency Department (HOSPITAL_COMMUNITY)

## 2023-10-30 ENCOUNTER — Emergency Department (HOSPITAL_COMMUNITY): Admission: EM | Admit: 2023-10-30 | Discharge: 2023-10-31 | Discharge status: Against Medical Advice

## 2023-10-30 DIAGNOSIS — R0981 Nasal congestion: Secondary | ICD-10-CM | POA: Diagnosis not present

## 2023-10-30 DIAGNOSIS — Z5321 Procedure and treatment not carried out due to patient leaving prior to being seen by health care provider: Secondary | ICD-10-CM | POA: Diagnosis not present

## 2023-10-30 DIAGNOSIS — R0602 Shortness of breath: Secondary | ICD-10-CM | POA: Insufficient documentation

## 2023-10-30 DIAGNOSIS — R109 Unspecified abdominal pain: Secondary | ICD-10-CM | POA: Diagnosis not present

## 2023-10-30 DIAGNOSIS — R197 Diarrhea, unspecified: Secondary | ICD-10-CM | POA: Insufficient documentation

## 2023-10-30 DIAGNOSIS — J45909 Unspecified asthma, uncomplicated: Secondary | ICD-10-CM | POA: Diagnosis not present

## 2023-10-30 LAB — URINALYSIS, ROUTINE W REFLEX MICROSCOPIC
Bilirubin Urine: NEGATIVE
Glucose, UA: NEGATIVE mg/dL
Hgb urine dipstick: NEGATIVE
Ketones, ur: NEGATIVE mg/dL
Leukocytes,Ua: NEGATIVE
Nitrite: NEGATIVE
Protein, ur: NEGATIVE mg/dL
Specific Gravity, Urine: 1.01 (ref 1.005–1.030)
pH: 5 (ref 5.0–8.0)

## 2023-10-30 LAB — CBC WITH DIFFERENTIAL/PLATELET
Abs Immature Granulocytes: 0.16 K/uL — ABNORMAL HIGH (ref 0.00–0.07)
Basophils Absolute: 0 K/uL (ref 0.0–0.1)
Basophils Relative: 0 %
Eosinophils Absolute: 0 K/uL (ref 0.0–0.5)
Eosinophils Relative: 0 %
HCT: 38.5 % (ref 36.0–46.0)
Hemoglobin: 11.6 g/dL — ABNORMAL LOW (ref 12.0–15.0)
Immature Granulocytes: 1 %
Lymphocytes Relative: 17 %
Lymphs Abs: 1.9 K/uL (ref 0.7–4.0)
MCH: 22.4 pg — ABNORMAL LOW (ref 26.0–34.0)
MCHC: 30.1 g/dL (ref 30.0–36.0)
MCV: 74.3 fL — ABNORMAL LOW (ref 80.0–100.0)
Monocytes Absolute: 0.3 K/uL (ref 0.1–1.0)
Monocytes Relative: 3 %
Neutro Abs: 8.9 K/uL — ABNORMAL HIGH (ref 1.7–7.7)
Neutrophils Relative %: 79 %
Platelets: 348 K/uL (ref 150–400)
RBC: 5.18 MIL/uL — ABNORMAL HIGH (ref 3.87–5.11)
RDW: 19.7 % — ABNORMAL HIGH (ref 11.5–15.5)
WBC: 11.3 K/uL — ABNORMAL HIGH (ref 4.0–10.5)
nRBC: 0.2 % (ref 0.0–0.2)

## 2023-10-30 LAB — COMPREHENSIVE METABOLIC PANEL WITH GFR
ALT: 26 U/L (ref 0–44)
AST: 15 U/L (ref 15–41)
Albumin: 3.6 g/dL (ref 3.5–5.0)
Alkaline Phosphatase: 79 U/L (ref 38–126)
Anion gap: 11 (ref 5–15)
BUN: 14 mg/dL (ref 6–20)
CO2: 23 mmol/L (ref 22–32)
Calcium: 9.6 mg/dL (ref 8.9–10.3)
Chloride: 105 mmol/L (ref 98–111)
Creatinine, Ser: 0.79 mg/dL (ref 0.44–1.00)
GFR, Estimated: >60 mL/min (ref >60)
Glucose, Bld: 133 mg/dL — ABNORMAL HIGH (ref 70–99)
Potassium: 4 mmol/L (ref 3.5–5.1)
Sodium: 139 mmol/L (ref 135–145)
Total Bilirubin: 0.4 mg/dL (ref 0.0–1.2)
Total Protein: 7.5 g/dL (ref 6.5–8.1)

## 2023-10-30 LAB — PREGNANCY, URINE: Preg Test, Ur: NEGATIVE

## 2023-10-30 NOTE — ED Triage Notes (Addendum)
 Pt. Has been having Sob since Sunday.  Went to UC on Sunday, diagnosed with exacerbation of asthma given prednisone  and albuterol .  Went to PCP on Monday did the x-ray in the office and told her they saw something on the x-ray and was sent to ED again for CT scan, At [facility] the EDP did not feel it was necessary to have CT and felt it was virus.   Pt. Reports that the medication is making her feel bad.  The sob and breathing has not had improvement.  She is having diarrhea  Pt. Is speaking full sentences.  Skin is warm and dry.

## 2023-10-30 NOTE — ED Provider Triage Note (Signed)
 Emergency Medicine Provider Triage Evaluation Note  Victoria Booth , a 43 y.o. female  was evaluated in triage.  Pt complains of shortness of breath since Sunday.  Review of Systems  Positive: Shortness of breath, congestion, diarrhea, abdominal pain Negative: Chest pain, dizziness, headache weakness, focal deficits  Physical Exam  BP (!) 158/87 (BP Location: Left Arm)   Pulse (!) 53   Temp 98.1 F (36.7 C)   Resp 16   Ht 5' 6 (1.676 m)   Wt 46.3 kg   LMP 10/17/2023 (Approximate)   SpO2 98%   BMI 16.48 kg/m  Gen:   Awake, no distress   Resp:  Normal effort lung clear to auscultation all fields MSK:   Moves extremities without difficulty  Other:  Not abdominal pain with reports of diarrhea  Medical Decision Making  Medically screening exam initiated at 3:33 PM.  Appropriate orders placed.  Victoria Booth was informed that the remainder of the evaluation will be completed by another provider, this initial triage assessment does not replace that evaluation, and the importance of remaining in the ED until their evaluation is complete.  Patient has had shortness of breath since Sunday and has been seen to urgent care and emergency room for evaluation for shortness of breath.  Patient was placed on antibiotic and prednisone  and does not like the the medicine auscultation all fields patient has no obvious respiratory distress chest x-ray ordered to rule out pneumonia   Myriam Fonda RAMAN, [place] 10/30/23 1538

## 2023-10-30 NOTE — ED Triage Notes (Signed)
 Patient reports SHOB since Sunday, hx of asthma. Since Sunday patient has had CXR, EKG, neb treatments, abx, steroids, and BP meds. Patient reports no relief with meds and now stomach pain.

## 2023-11-02 ENCOUNTER — Other Ambulatory Visit: Payer: Self-pay

## 2023-11-02 ENCOUNTER — Encounter (HOSPITAL_BASED_OUTPATIENT_CLINIC_OR_DEPARTMENT_OTHER): Payer: Self-pay

## 2023-11-02 ENCOUNTER — Emergency Department (HOSPITAL_BASED_OUTPATIENT_CLINIC_OR_DEPARTMENT_OTHER): Admission: EM | Admit: 2023-11-02 | Discharge: 2023-11-02 | Disposition: A | Discharge status: Home / Self Care

## 2023-11-02 DIAGNOSIS — I10 Essential (primary) hypertension: Secondary | ICD-10-CM | POA: Insufficient documentation

## 2023-11-02 DIAGNOSIS — J45909 Unspecified asthma, uncomplicated: Secondary | ICD-10-CM | POA: Insufficient documentation

## 2023-11-02 DIAGNOSIS — Z91040 Latex allergy status: Secondary | ICD-10-CM | POA: Insufficient documentation

## 2023-11-02 DIAGNOSIS — J069 Acute upper respiratory infection, unspecified: Secondary | ICD-10-CM | POA: Insufficient documentation

## 2023-11-02 DIAGNOSIS — Z79899 Other long term (current) drug therapy: Secondary | ICD-10-CM | POA: Insufficient documentation

## 2023-11-02 DIAGNOSIS — R0602 Shortness of breath: Secondary | ICD-10-CM | POA: Diagnosis present

## 2023-11-02 DIAGNOSIS — R059 Cough, unspecified: Secondary | ICD-10-CM | POA: Insufficient documentation

## 2023-11-02 LAB — COMPREHENSIVE METABOLIC PANEL WITH GFR
ALT: 16 U/L (ref 0–44)
AST: 13 U/L — ABNORMAL LOW (ref 15–41)
Albumin: 4.4 g/dL (ref 3.5–5.0)
Alkaline Phosphatase: 95 U/L (ref 38–126)
Anion gap: 14 (ref 5–15)
BUN: 15 mg/dL (ref 6–20)
CO2: 25 mmol/L (ref 22–32)
Calcium: 10 mg/dL (ref 8.9–10.3)
Chloride: 100 mmol/L (ref 98–111)
Creatinine, Ser: 0.99 mg/dL (ref 0.44–1.00)
GFR, Estimated: >60 mL/min (ref >60)
Glucose, Bld: 99 mg/dL (ref 70–99)
Potassium: 4 mmol/L (ref 3.5–5.1)
Sodium: 138 mmol/L (ref 135–145)
Total Bilirubin: 0.6 mg/dL (ref 0.0–1.2)
Total Protein: 8 g/dL (ref 6.5–8.1)

## 2023-11-02 LAB — CBC WITH DIFFERENTIAL/PLATELET
Abs Immature Granulocytes: 0.08 K/uL — ABNORMAL HIGH (ref 0.00–0.07)
Basophils Absolute: 0.1 K/uL (ref 0.0–0.1)
Basophils Relative: 1 %
Eosinophils Absolute: 0.2 K/uL (ref 0.0–0.5)
Eosinophils Relative: 2 %
HCT: 44.1 % (ref 36.0–46.0)
Hemoglobin: 13.6 g/dL (ref 12.0–15.0)
Immature Granulocytes: 1 %
Lymphocytes Relative: 41 %
Lymphs Abs: 4 K/uL (ref 0.7–4.0)
MCH: 22.5 pg — ABNORMAL LOW (ref 26.0–34.0)
MCHC: 30.8 g/dL (ref 30.0–36.0)
MCV: 72.9 fL — ABNORMAL LOW (ref 80.0–100.0)
Monocytes Absolute: 0.5 K/uL (ref 0.1–1.0)
Monocytes Relative: 5 %
Neutro Abs: 5 K/uL (ref 1.7–7.7)
Neutrophils Relative %: 50 %
Platelets: 327 K/uL (ref 150–400)
RBC: 6.05 MIL/uL — ABNORMAL HIGH (ref 3.87–5.11)
RDW: 20.4 % — ABNORMAL HIGH (ref 11.5–15.5)
WBC: 9.9 K/uL (ref 4.0–10.5)
nRBC: 0 % (ref 0.0–0.2)

## 2023-11-02 LAB — TROPONIN T, HIGH SENSITIVITY: Troponin T High Sensitivity: <15 ng/L (ref 0–19)

## 2023-11-02 LAB — RESP PANEL BY RT-PCR (RSV, FLU A&B, COVID)  RVPGX2
Influenza A by PCR: NEGATIVE
Influenza B by PCR: NEGATIVE
Resp Syncytial Virus by PCR: NEGATIVE
SARS Coronavirus 2 by RT PCR: NEGATIVE

## 2023-11-02 LAB — HCG, SERUM, QUALITATIVE: Preg, Serum: NEGATIVE

## 2023-11-02 MED ORDER — SODIUM CHLORIDE 0.9 % IV BOLUS
1000.0000 mL | Freq: Once | INTRAVENOUS | Status: AC
  Administered 2023-11-02: 1000 mL via INTRAVENOUS

## 2023-11-02 NOTE — Discharge Instructions (Addendum)
 You were evaluated in the emergency room for fatigue .You may use Tylenol  1000 mg and/or Motrin  600 mg every 4-6 hours up to 3 times a day for fever or body aches.  Please keep in mind that this dosing is not meant to be continued long-term and that many over-the-counter cough and flu medications contain acetaminophen  or ibuprofen . You can expect your current symptoms to linger over the next week or two but please return to the emergency room if you experience any new or worsening symptoms including persistent fevers, worsening productive cough and persistent vomiting. Please follow-up with your primary care provider regarding your ER visit.

## 2023-11-02 NOTE — ED Triage Notes (Addendum)
 Pt has been taking doxycycline  and steroid for the past week. Poor po intake and feels fatigued. No cough or SOB but feels discomfort in chest. Using nebulizers at home

## 2023-11-02 NOTE — ED Provider Notes (Signed)
 [facility] Provider Note   CSN: [id number] Arrival date & time: 11/02/23  1036     Patient presents with: Fatigue   Victoria Booth is a 43 y.o. female history of asthma, hypertension presents with complaints of fatigue over the past few weeks.  She initially had URI symptoms.  Had a chest x-ray done and was covered for pneumonia and asthma exacerbation with doxycycline  and prednisone .  Reports that she has been taking that.  She has been using breathing treatments at home as well.  She Dors is some chest tightness without shortness of breath or pain.  Her symptoms are not exertional.  Her cough has resolved.  She reports poor p.o. intake.  Has nausea without vomiting.  No abdominal pain.  No recent sick contacts.  Has no cardiac history or prior blood clots.  Is not on birth control.  No recent travel, surgeries, hospitalizations or malignancy.   HPI    Past Medical History:  Diagnosis Date   Anemia    Asthma    Crohn disease (HCC)    Hypertension    LUQ abdominal pain 10/16/2017   Microcytic anemia 12/01/2014   Obesity      Prior to Admission medications   Medication Sig Start Date End Date Taking? Authorizing Provider  albuterol  (PROVENTIL ) (2.5 MG/3ML) 0.083% nebulizer solution Take 3 mLs (2.5 mg total) by nebulization every 6 (six) hours as needed for wheezing or shortness of breath. 10/27/23   Midge Golas, MD  atenolol-chlorthalidone  (TENORETIC) 50-25 MG tablet Take 1 tablet by mouth daily.    [provider]  doxycycline  (VIBRAMYCIN ) 100 MG capsule Take 1 capsule (100 mg total) by mouth 2 (two) times daily for 7 days. 10/26/23 11/02/23  Billy Asberry FALCON, PA-C    Allergies: Cephalexin , Lisinopril , Penicillins, Povidone iodine, Shellfish allergy, Latex, and Sulfa antibiotics    Review of Systems  Constitutional:  Positive for fatigue.    Updated Vital Signs BP 125/84 (BP Location: Left Arm)   Pulse (!) 53    Temp 98.1 F (36.7 C) (Oral)   Resp 13   Wt 130.2 kg   LMP 10/17/2023 (Approximate)   SpO2 100%   BMI 46.32 kg/m   Physical Exam Vitals and nursing note reviewed.  Constitutional:      General: She is not in acute distress.    Appearance: She is well-developed.  HENT:     Head: Normocephalic and atraumatic.  Eyes:     Conjunctiva/sclera: Conjunctivae normal.  Cardiovascular:     Rate and Rhythm: Normal rate and regular rhythm.     Heart sounds: No murmur heard. Pulmonary:     Effort: Pulmonary effort is normal. No respiratory distress.     Breath sounds: Normal breath sounds.  Abdominal:     Palpations: Abdomen is soft.     Tenderness: There is no abdominal tenderness.  Musculoskeletal:        General: No swelling.     Cervical back: Neck supple.  Skin:    General: Skin is warm and dry.     Capillary Refill: Capillary refill takes less than 2 seconds.  Neurological:     Mental Status: She is alert.  Psychiatric:        Mood and Affect: Mood normal.     (all labs ordered are listed, but only abnormal results are displayed) Labs Reviewed  CBC WITH DIFFERENTIAL/PLATELET - Abnormal; Notable for the following components:      Result  Value   RBC 6.05 (*)    MCV 72.9 (*)    MCH 22.5 (*)    RDW 20.4 (*)    Abs Immature Granulocytes 0.08 (*)    All other components within normal limits  COMPREHENSIVE METABOLIC PANEL WITH GFR - Abnormal; Notable for the following components:   AST 13 (*)    All other components within normal limits  RESP PANEL BY RT-PCR (RSV, FLU A&B, COVID)  RVPGX2  HCG, SERUM, QUALITATIVE  TROPONIN T, HIGH SENSITIVITY  TROPONIN T, HIGH SENSITIVITY    EKG: EKG Interpretation Date/Time:  Sunday November 02 2023 11:31:03 EDT Ventricular Rate:  57 PR Interval:  176 QRS Duration:  77 QT Interval:  410 QTC Calculation: 400 R Axis:   -10  Text Interpretation: Sinus rhythm Probable anterior infarct, age indeterminate Confirmed by Ula Barter  [phone]) on 11/02/2023 12:12:39 PM  Radiology: No results found.   Procedures   Medications Ordered in the ED  sodium chloride  0.9 % bolus 1,000 mL (0 mLs Intravenous Stopped 11/02/23 1242)    Clinical Course as of 11/02/23 1311  Sun Nov 02, 2023  1110 Patient with history of hypertension and asthma evaluated for complaints of fatigue and chest tightness over the past couple weeks in the setting of URI.  Is not associated with any shortness of breath or chest pain.  Patient is hemodynamically stable.  On exam her lungs are clear, abdomen nontender, no lower extremity edema or tenderness.  Will obtain routine labs.  Patient is requesting IV fluids.  In the setting of poor p.o. intake x 2 weeks, this is reasonable.  Will hold off on any additional imaging or breathing treatments as patient has already had a clear chest x-ray. [JT]  1155 Resp panel by RT-PCR (RSV, Flu A&B, Covid) Anterior Nasal Swab Negative [JT]  1203 ED EKG Sinus bradycardia, nonspecific T wave abnormalities unchanged from prior EKG [JT]  1209 hCG, serum, qualitative Negative [JT]  1228 Comprehensive metabolic panel(!) No significant abnormality [JT]  1244 CBC with Differential(!) No significant abnormality [JT]  1305 Troponin T, High Sensitivity Without elevation [JT]  1308 Overall workup reassuring.  Etiology likely persistent viral URI.  Patient will be discharged home.  Instructed on supportive care.  Patient is understanding agreement with plan.  Strict return precautions provided including fever, chills, worsening cough or vomiting. [JT]    Clinical Course User Index [JT] Donnajean Lynwood DEL, PA-C                                 Medical Decision Making  This patient presents to the ED with chief complaint(s) of fatigue .  The complaint involves an extensive differential diagnosis and also carries with it a high risk of complications and morbidity.   Pertinent past medical history as listed in HPI  The  differential diagnosis includes  Considered pneumonia however patient is afebrile with clear lung sounds. Additional history obtained: Records reviewed Care Everywhere/External Records  Disposition:   Patient will be discharged home. The patient has been appropriately medically screened and/or stabilized in the ED. I have low suspicion for any other emergent medical condition which would require further screening, evaluation or treatment in the ED or require inpatient management. At time of discharge the patient is hemodynamically stable and in no acute distress. I have discussed work-up results and diagnosis with patient and answered all questions. Patient is agreeable with discharge plan. We  discussed strict return precautions for returning to the emergency department and they verbalized understanding.     Social Determinants of Health:   none  This note was dictated with voice recognition software.  Despite best efforts at proofreading, errors may have occurred which can change the documentation meaning.       Final diagnoses:  Upper respiratory tract infection, unspecified type    ED Discharge Orders     None          Donnajean Lynwood VEAR DEVONNA 11/02/23 1311    Ula Prentice SAUNDERS, MD 11/02/23 1420

## 2023-11-02 NOTE — ED Notes (Signed)
Reviewed discharge instructions, medications and follow up with pt. Pt states understanding. Ambulatory at discharge

## 2023-11-04 ENCOUNTER — Other Ambulatory Visit

## 2023-12-12 ENCOUNTER — Telehealth: Payer: Self-pay | Admitting: Physician Assistant

## 2023-12-12 NOTE — Telephone Encounter (Signed)
 Scheduled patient for labs and follow-up. Called and spoke with the patient, she is aware.

## 2023-12-15 NOTE — Progress Notes (Signed)
 [facility]:    Date:  12/25/2023   ID:  Victoria Booth, DOB [birth year], MRN [id number]  PCP:  [name], PA  Cardiologist:  New - Dr. Shlomo (DOD today) Click to update primary MD,subspecialty MD or APP then REFRESH:1}    Referring MD: [name], PA   Chief Complaint: elevated BP  History of Present Illness:    Victoria Booth is a 43 y.o. female with a history of hypertension, type 2 diabetes mellitus, asthma, Chron's disease, iron  deficiency anemia, remote migraines, and obesity who presents today as a new patient in the [facility] for further evaluation of elevated BP.   Patient has had multiple Urgent Care/ ED visits over the last several years often for URIs or asthma exacerbations. She had multiple visits in 10/2023 for this and was treated with antibiotics and steroids. Most recent ED visit was on 11/02/2023 for further evaluation of fatigue and chest tightness in setting of her URI. EKG showed sinus bradycardia, rate 57 bpm, with T wave inversions in lead III and mild biphasic T waves in lead aVR (the T wave inversions in lead III have been present since at least 2016). High-sensitivity troponin was negative. Respiratory panel was negative for COVID, influenza, and RSV. She was given IV fluids and felt to be stable for discharge.  She was subsequently seen by her PCP on 11/28/2023 and repeat EKG showed sinus bradycardia with rate of 47 bpm. Therefore, she was referred to Cardiology.   The referral was placed for palpitations but patient denies any significant palpitations to me and states she is here because they cannot get her blood pressure under control.  She was previously on Atenolol-Chlorthalidone  50-25 mg daily but she states this was dropping her heart rate too low so this was stopped a couple weeks ago when she was started on Spironolactone  and Amlodipine  isntead.  Amlodipine  was increased to 2.5 mg twice daily about  a week ago due to continued elevated BP.  She states BP is often in the 140s/ 90s first thing in the morning and then will improve to the 130s/ 80s after she takes her medications.  She states she has had BP issues for a few years now.  She denies any chest pain/discomfort.  She reports occasional shortness of breath during upper respiratory infections or asthma flares but nothing significant outside these times.  No orthopnea, PND, or edema.  She reports some brief palpitations that she describes as a skipped beat and she is having reflux.  She describes her reflux as a gurgling sensation that starts in her stomach and migrates up to her throat.  However, she denies any significant palpitations.  No lightheadedness, dizziness, or syncope.  She does report some fatigue but states she just does not sleep well at night.  She is here with her significant other, Luke, who reports she does snore softly but she denies any apneic episodes.  We reviewed her past medical history as detailed above.  She has iron  deficiency anemia due to menorrhagia and has required iron  infusions in the past.  Her last iron  infusion was earlier this week on 12/23/2023 but this was the first infusion she has had since 2023.  She has a history of tobacco use but quit over 10 years ago.  She denies any alcohol or recreational drug use.  Her mother had a stroke at the age of 60 but she denies any other family history of cardiovascular disease.  Recent Labs from [facility] on 10/2023  - Iron  panel: Iron  26 (low), Iron  Saturation 8 (low), Ferritin 255 (high) - CMET: Na 139, K 4.3, Glucose 97, BUN 7, Cr 0.63, Albumin 3.9, AST 17, ALT 20, Alk Phos 90, Total Bili 0.3  EKGs/Labs/Other Studies Reviewed:    The following studies were reviewed:  N/A  EKG:  EKG  ordered today.   EKG Interpretation Date/Time:  Thursday December 25 2023 08:08:47 EST Ventricular Rate:  78 PR Interval:  164 QRS Duration:  78 QT Interval:  372 QTC  Calculation: 424 R Axis:   -12  Text Interpretation: Normal sinus rhythm Biphasic T wave in lead III Possible Q waves in leads III and aVF No significant changes compared to prior tracings Confirmed by Jadine Patient [phone]) on 12/25/2023 8:13:06 AM    Recent Labs: 12/18/2023: ALT 7; BUN 11; Creatinine 0.72; Hemoglobin 11.2; Platelet Count 281; Potassium 3.9; Sodium 139  Recent Lipid Panel    Component Value Date/Time   CHOL 140 12/01/2014 0952   TRIG 60 12/01/2014 0952   HDL 37 (L) 12/01/2014 0952   CHOLHDL 3.8 12/01/2014 0952   CHOLHDL 3.6 Ratio 03/24/2009 2139   VLDL 13 03/24/2009 2139   LDLCALC 91 12/01/2014 0952    Physical Exam:    Vital Signs: BP (!) 142/94   Pulse 78   Ht 5' 6 (1.676 m)   Wt (!) 305 lb 3.2 oz (138.4 kg)   SpO2 96%   BMI 49.26 kg/m     Wt Readings from Last 3 Encounters:  12/25/23 (!) 305 lb 3.2 oz (138.4 kg)  12/23/23 (!) 308 lb (139.7 kg)  12/18/23 (!) 306 lb (138.8 kg)     General: 43 y.o. obese African-American female in no acute distress. HEENT: Normocephalic and atraumatic. Sclera clear.  Neck: Supple. No carotid bruits. No JVD. Heart: RRR. Distinct S1 and S2. No murmurs, gallops, or rubs.  Lungs: No increased work of breathing. Clear to ausculation bilaterally. No wheezes, rhonchi, or rales.   Extremities: No lower extremity edema.   Skin: Warm and dry. Neuro: No focal deficits. Psych: Normal affect. Responds appropriately.  Assessment:    1. Primary hypertension   2. Palpitations   3. Type 2 diabetes mellitus with morbid obesity (HCC)   4. Fatigue, unspecified type   5. Snoring     Plan:    Hypertension  BP elevated.  BP initially 140/96 and then 142/94 on my personal recheck at the end of visit. - She was previously on Atenolol-Chlorthalidone  50-25mg  daily this was stopped due to concerns that the Atenolol was dropping her heart rate too low and she was recently started on Spironolactone  25 mg daily and Amlodipine  2.5 mg  twice daily instead.   - Recommending switching Amlodipine  to 5 mg once daily. - Asked patient to keep a BP/heart rate log for me for 2 weeks and then send this to me via MyChart.  If if BP consistently above goal of <13/80, we will plan to increase Amlodipine  to 10 mg daily. - Recommended decreasing sodium intake and increasing physical activity.  Also ordered an [employer] home sleep study to assess for obstructive sleep apnea. - Of note, I offered an Echo. After shared-decision making, plan is to hold off on this for now and re-discuss at next visit based on how BP is doing.   Palpitations Referral was originally placed for palpitations but patient states this occurs rarely and it sounds like premature beats. - Can continue to  monitor for now.  If symptoms worsen, can get a formal monitor.  Type 2 Diabetes Mellitus Hemoglobin A1c 6.1% in 10/2023.  - Management per PCP.   Fatigue Snoring  Patient reports feeling fatigued but admits that she does not sleep well at night.  Her significant other does report some soft snoring but does not describe apneic episodes.  BMI is 49.26.  STOP-BANG score equals 4 suggesting she is high risk for obstructive sleep apnea. - Will order Itmar home sleep study.  Disposition: Follow up in 2 months.    Signed, Aline FORBES Door, PA-C  12/25/2023 1:08 PM    [facility]

## 2023-12-15 NOTE — Progress Notes (Unsigned)
 [facility] OFFICE PROGRESS NOTE  Victoria Booth, [name] [address] [place] [address]  DIAGNOSIS: Crohn's disease and persistent iron  deficiency anemia and intolerance to the oral iron  tablets.   PRIOR THERAPY: IV iron  PRN, last given on 07/10/21  CURRENT THERAPY: None  INTERVAL HISTORY: Victoria Booth 43 y.o. female returns to the clinic today for a follow-up visit.  The patient establish care on 06/12/2021.  She was lost to follow-up since that time.  At that point in time she was being seen for IDA due to Crohn's disease.   She was referred to the clinic for anemia.  She had lab work performed on 10/27/2023 that showed hemoglobin of 10.6 and MCV low at 75.7.  Her iron  was low at 26 and her saturation was low at 8%.  Her TIBC was 329 which was normal and her ferritin was elevated 255.   The patient has history of Crohn's disease and has had anemia for several years.  She has intolerance to iron  tablets due to constipation.  She received IV iron  infusions in the past the most recent on 07/10/2021.   She reports feeling generally fine today but notes elevated blood pressure earlier, despite taking her medication at 7:30 AM. She has a history of hypertension and was recently switched from atenolol (due to bradycardia) to amlodipine  2.5 mg twice daily. No headaches, chest pain, or vision changes.  She states she is feeling fine today. Denies any abnormal bleeding or bruising. However, she had not had a menstrual period in a year but had one last week that was very heavy. She was needing to change overnight pads every 2-3 hours. She just established with a new GYN.    She follows with Dr. Avram from gastroenterology. She believes she is due for a colonoscopy soon.    She is not on any blood thinners. She denies any chronic kidney disease.  She feels tired most of the time and occasionally experiences lightheadedness or dizziness. She does not have any dietary  restrictions but prefers to avoid red meat due to difficulty digesting it. She consumes green leafy vegetables like spinach.     MEDICAL HISTORY: Past Medical History:  Diagnosis Date   Anemia    Asthma    Crohn disease (HCC)    Hypertension    LUQ abdominal pain 10/16/2017   Microcytic anemia 12/01/2014   Obesity     ALLERGIES:  is allergic to cephalexin , lisinopril , penicillins, povidone iodine, shellfish allergy, latex, and sulfa antibiotics.  MEDICATIONS:  Current Outpatient Medications  Medication Sig Dispense Refill   amLODipine  (NORVASC ) 2.5 MG tablet Take 2.5 mg by mouth 2 (two) times daily. Patient reported     albuterol  (PROVENTIL ) (2.5 MG/3ML) 0.083% nebulizer solution Take 3 mLs (2.5 mg total) by nebulization every 6 (six) hours as needed for wheezing or shortness of breath. 75 mL 12   No current facility-administered medications for this visit.    SURGICAL HISTORY:  Past Surgical History:  Procedure Laterality Date   EXTRACORPOREAL SHOCK WAVE LITHOTRIPSY Left 07/16/2018   Procedure: EXTRACORPOREAL SHOCK WAVE LITHOTRIPSY (ESWL);  Surgeon: Penne Rosina, MD;  Location: [facility] ORS;  Service: Urology;  Laterality: Left;   TOENAIL EXCISION      REVIEW OF SYSTEMS:   Review of Systems  Constitutional: Positive for fatigue. Negative for appetite change, chills, fever and unexpected weight change.  HENT:   Negative for mouth sores, nosebleeds, sore throat and trouble swallowing.   Eyes:  Negative for eye problems and icterus.  Respiratory: Negative for cough, hemoptysis, shortness of breath and wheezing.   Cardiovascular: Negative for chest pain and leg swelling.  Gastrointestinal: Negative for abdominal pain, constipation, diarrhea, nausea and vomiting.  Genitourinary: Negative for bladder incontinence, difficulty urinating, dysuria, frequency and hematuria.   Musculoskeletal: Negative for back pain, gait problem, neck pain and neck stiffness.  Skin: Negative for itching  and rash.  Neurological: Occasional lightheadedness/dizziness.  Negative for dizziness, extremity weakness, gait problem, headaches, light-headedness and seizures.  Hematological: Negative for adenopathy. Does not bruise/bleed easily.  Psychiatric/Behavioral: Negative for confusion, depression and sleep disturbance. The patient is not nervous/anxious.     PHYSICAL EXAMINATION:  Blood pressure (!) 145/89, pulse 74, temperature 98.2 F (36.8 C), temperature source Temporal, resp. rate 18, height 5' 6 (1.676 m), weight (!) 306 lb (138.8 kg), SpO2 99%.  ECOG PERFORMANCE STATUS: 1  Physical Exam  Constitutional: Oriented to person, place, and time and well-developed, well-nourished, and in no distress.  HENT:  Head: Normocephalic and atraumatic.  Mouth/Throat: Oropharynx is clear and moist. No oropharyngeal exudate.  Eyes: Conjunctivae are normal. Right eye exhibits no discharge. Left eye exhibits no discharge. No scleral icterus.  Neck: Normal range of motion. Neck supple.  Cardiovascular: Normal rate, regular rhythm, normal heart sounds and intact distal pulses.   Pulmonary/Chest: Effort normal and breath sounds normal. No respiratory distress. No wheezes. No rales.  Abdominal: Soft. Bowel sounds are normal. Exhibits no distension and no mass. There is no tenderness.  Musculoskeletal: Normal range of motion. Exhibits no edema.  Lymphadenopathy:    No cervical adenopathy.  Neurological: Alert and oriented to person, place, and time. Exhibits normal muscle tone. Gait normal. Coordination normal.  Skin: Skin is warm and dry. No rash noted. Not diaphoretic. No erythema. No pallor.  Psychiatric: Mood, memory and judgment normal.  Vitals reviewed.  LABORATORY DATA: Lab Results  Component Value Date   WBC 7.6 12/18/2023   HGB 11.2 (L) 12/18/2023   HCT 36.3 12/18/2023   MCV 74.7 (L) 12/18/2023   PLT 281 12/18/2023      Chemistry      Component Value Date/Time   NA 139 12/18/2023 0740    NA 138 06/02/2019 1117   K 3.9 12/18/2023 0740   CL 108 12/18/2023 0740   CO2 23 12/18/2023 0740   BUN 11 12/18/2023 0740   BUN 7 06/02/2019 1117   CREATININE 0.72 12/18/2023 0740      Component Value Date/Time   CALCIUM 8.8 (L) 12/18/2023 0740   ALKPHOS 84 12/18/2023 0740   AST 11 (L) 12/18/2023 0740   ALT 7 12/18/2023 0740   BILITOT 0.3 12/18/2023 0740       RADIOGRAPHIC STUDIES:  No results found.   ASSESSMENT/PLAN:  This is a very pleasant 43 year old African-American female referred to the clinic for iron  deficiency anemia.  She has a history of Crohn's disease.  She has intolerance to oral iron  supplements.  She had repeat CBC, iron  studies, ferritin, SPEP, hemoglobin electrophoresis?  Vitamin B12 testing, and folate testing.  Her labs today show Microcytic anemia with Hbg of 11.2. Her iron  studies are pending.  I will wait for her iron  studies. If low, I will arrange for IV iron  infusions at the [facility] infusion center with Venofer  300 mg weekly x 3.  We will see her back for labs and a follow-up visit in 4 months  She is unable to tolerate oral iron  supplements.  She will  reach out to GI for her colonoscopy if she is due.   Medication switched from atenolol to amlodipine  due to bradycardia. Current low dose of amlodipine . Elevated blood pressure today without symptoms. Home monitoring advised. - Rechecked blood pressure before leaving. This improved. - Monitor blood pressure at home and log results. - Share blood pressure log with prescribing physician (PCP) and contact them for clarification about her dose.  - Consider increasing amlodipine  dose if hypertension persists  Heavy menstrual bleeding Recent heavy bleeding episode. No current gynecologist. Hemoglobin stable, no acute anemia. - Continue follow-up with gynecologist for management.  The patient was advised to call immediately if she has any concerning symptoms in the interval. The patient  voices understanding of current disease status and treatment options and is in agreement with the current care plan. All questions were answered. The patient knows to call the clinic with any problems, questions or concerns. We can certainly see the patient much sooner if necessary  Orders Placed This Encounter  Procedures   CBC with Differential (Cancer Center Only)    Standing Status:   Future    Expected Date:   03/19/2024    Expiration Date:   12/17/2024   Ferritin    Standing Status:   Future    Expected Date:   03/19/2024    Expiration Date:   12/17/2024   Iron  and Iron  Binding Capacity ([facility] only)    Standing Status:   Future    Expected Date:   03/19/2024    Expiration Date:   12/17/2024    The total time spent in the appointment was 20-29 minutes  Brinleigh Tew L Deldrick Linch, PA-C 12/18/23

## 2023-12-17 ENCOUNTER — Other Ambulatory Visit: Payer: Self-pay | Admitting: Physician Assistant

## 2023-12-17 DIAGNOSIS — D509 Iron deficiency anemia, unspecified: Secondary | ICD-10-CM

## 2023-12-18 ENCOUNTER — Inpatient Hospital Stay: Admitting: Physician Assistant

## 2023-12-18 ENCOUNTER — Inpatient Hospital Stay: Attending: Physician Assistant

## 2023-12-18 ENCOUNTER — Other Ambulatory Visit: Payer: Self-pay | Admitting: Physician Assistant

## 2023-12-18 ENCOUNTER — Telehealth: Payer: Self-pay

## 2023-12-18 VITALS — BP 145/89 | HR 74 | Temp 98.2°F | Resp 18 | Ht 66.0 in | Wt 306.0 lb

## 2023-12-18 DIAGNOSIS — D509 Iron deficiency anemia, unspecified: Secondary | ICD-10-CM | POA: Diagnosis present

## 2023-12-18 DIAGNOSIS — R001 Bradycardia, unspecified: Secondary | ICD-10-CM | POA: Diagnosis not present

## 2023-12-18 DIAGNOSIS — Z79899 Other long term (current) drug therapy: Secondary | ICD-10-CM | POA: Diagnosis not present

## 2023-12-18 DIAGNOSIS — N920 Excessive and frequent menstruation with regular cycle: Secondary | ICD-10-CM | POA: Diagnosis not present

## 2023-12-18 DIAGNOSIS — E538 Deficiency of other specified B group vitamins: Secondary | ICD-10-CM

## 2023-12-18 DIAGNOSIS — K5090 Crohn's disease, unspecified, without complications: Secondary | ICD-10-CM | POA: Diagnosis not present

## 2023-12-18 LAB — CMP (CANCER CENTER ONLY)
ALT: 7 U/L (ref 0–44)
AST: 11 U/L — ABNORMAL LOW (ref 15–41)
Albumin: 3.7 g/dL (ref 3.5–5.0)
Alkaline Phosphatase: 84 U/L (ref 38–126)
Anion gap: 8 (ref 5–15)
BUN: 11 mg/dL (ref 6–20)
CO2: 23 mmol/L (ref 22–32)
Calcium: 8.8 mg/dL — ABNORMAL LOW (ref 8.9–10.3)
Chloride: 108 mmol/L (ref 98–111)
Creatinine: 0.72 mg/dL (ref 0.44–1.00)
GFR, Estimated: >60 mL/min (ref >60)
Glucose, Bld: 101 mg/dL — ABNORMAL HIGH (ref 70–99)
Potassium: 3.9 mmol/L (ref 3.5–5.1)
Sodium: 139 mmol/L (ref 135–145)
Total Bilirubin: 0.3 mg/dL (ref 0.0–1.2)
Total Protein: 7 g/dL (ref 6.5–8.1)

## 2023-12-18 LAB — CBC WITH DIFFERENTIAL (CANCER CENTER ONLY)
Abs Immature Granulocytes: 0.02 K/uL (ref 0.00–0.07)
Basophils Absolute: 0.1 K/uL (ref 0.0–0.1)
Basophils Relative: 1 %
Eosinophils Absolute: 0.2 K/uL (ref 0.0–0.5)
Eosinophils Relative: 3 %
HCT: 36.3 % (ref 36.0–46.0)
Hemoglobin: 11.2 g/dL — ABNORMAL LOW (ref 12.0–15.0)
Immature Granulocytes: 0 %
Lymphocytes Relative: 36 %
Lymphs Abs: 2.7 K/uL (ref 0.7–4.0)
MCH: 23 pg — ABNORMAL LOW (ref 26.0–34.0)
MCHC: 30.9 g/dL (ref 30.0–36.0)
MCV: 74.7 fL — ABNORMAL LOW (ref 80.0–100.0)
Monocytes Absolute: 0.5 K/uL (ref 0.1–1.0)
Monocytes Relative: 6 %
Neutro Abs: 4.1 K/uL (ref 1.7–7.7)
Neutrophils Relative %: 54 %
Platelet Count: 281 K/uL (ref 150–400)
RBC: 4.86 MIL/uL (ref 3.87–5.11)
RDW: 17.8 % — ABNORMAL HIGH (ref 11.5–15.5)
WBC Count: 7.6 K/uL (ref 4.0–10.5)
nRBC: 0 % (ref 0.0–0.2)

## 2023-12-18 LAB — IRON AND IRON BINDING CAPACITY (CC-WL,HP ONLY)
Iron: 27 ug/dL — ABNORMAL LOW (ref 28–170)
Saturation Ratios: 9 % — ABNORMAL LOW (ref 10.4–31.8)
TIBC: 302 ug/dL (ref 250–450)
UIBC: 275 ug/dL (ref 148–442)

## 2023-12-18 LAB — FERRITIN: Ferritin: 223 ng/mL (ref 11–307)

## 2023-12-18 LAB — VITAMIN B12: Vitamin B-12: 299 pg/mL (ref 180–914)

## 2023-12-18 LAB — FOLATE: Folate: 3.7 ng/mL — ABNORMAL LOW (ref >5.9)

## 2023-12-18 MED ORDER — FOLIC ACID 1 MG PO TABS: 1.0000 mg | ORAL_TABLET | Freq: Every day | ORAL | 2 refills | Status: AC

## 2023-12-18 NOTE — Telephone Encounter (Signed)
 Spoke with patient regarding recent lab results. Per Cassie, PA, iron  studies are slightly low and patient would benefit from IV iron . Patient's folate level was also low; Cassie, PA sent a prescription for folic acid to the patient's pharmacy. Informed patient that she will receive IV iron  at the [facility] and that the center will contact her to schedule appointments. Patient verbalized understanding.

## 2023-12-19 ENCOUNTER — Other Ambulatory Visit: Payer: Self-pay | Admitting: Physician Assistant

## 2023-12-22 ENCOUNTER — Telehealth: Payer: Self-pay | Admitting: Physician Assistant

## 2023-12-22 NOTE — Telephone Encounter (Signed)
 Scheduled patient for next appointment. Called and spoke with the patient, she is aware.

## 2023-12-23 ENCOUNTER — Ambulatory Visit (INDEPENDENT_AMBULATORY_CARE_PROVIDER_SITE_OTHER)

## 2023-12-23 VITALS — BP 143/89 | HR 75 | Temp 97.3°F | Resp 18 | Ht 66.0 in | Wt 308.0 lb

## 2023-12-23 DIAGNOSIS — D509 Iron deficiency anemia, unspecified: Secondary | ICD-10-CM

## 2023-12-23 MED ORDER — SODIUM CHLORIDE 0.9 % IV SOLN
300.0000 mg | Freq: Once | INTRAVENOUS | Status: AC
  Administered 2023-12-23: 300 mg via INTRAVENOUS
  Filled 2023-12-23: qty 15

## 2023-12-23 MED ORDER — ACETAMINOPHEN 325 MG PO TABS
650.0000 mg | ORAL_TABLET | Freq: Once | ORAL | Status: AC
  Administered 2023-12-23: 650 mg via ORAL
  Filled 2023-12-23: qty 2

## 2023-12-23 MED ORDER — DIPHENHYDRAMINE HCL 25 MG PO CAPS: 25.0000 mg | ORAL_CAPSULE | Freq: Once | ORAL | Status: DC

## 2023-12-23 NOTE — Patient Instructions (Signed)

## 2023-12-23 NOTE — Progress Notes (Signed)
 Diagnosis: Iron  Deficiency Anemia  Provider:  Praveen Mannam MD  Procedure: IV Infusion  IV Type: Peripheral, IV Location: R Hand  Venofer  (Iron  Sucrose), Dose: 300 mg  Infusion Start Time: 0901  Infusion Stop Time: 1039  Post Infusion IV Care: Observation period completed and Peripheral IV Discontinued  Discharge: Condition: Good, Destination: Home . AVS Provided  Performed by:  Illianna Paschal, RN

## 2023-12-25 ENCOUNTER — Ambulatory Visit: Attending: Cardiology | Admitting: Student

## 2023-12-25 ENCOUNTER — Ambulatory Visit: Admitting: Student

## 2023-12-25 VITALS — BP 142/94 | HR 78 | Ht 66.0 in | Wt 305.2 lb

## 2023-12-25 DIAGNOSIS — R0683 Snoring: Secondary | ICD-10-CM

## 2023-12-25 DIAGNOSIS — R002 Palpitations: Secondary | ICD-10-CM | POA: Diagnosis not present

## 2023-12-25 DIAGNOSIS — R5383 Other fatigue: Secondary | ICD-10-CM

## 2023-12-25 DIAGNOSIS — I10 Essential (primary) hypertension: Secondary | ICD-10-CM | POA: Diagnosis not present

## 2023-12-25 DIAGNOSIS — E1169 Type 2 diabetes mellitus with other specified complication: Secondary | ICD-10-CM

## 2023-12-25 MED ORDER — AMLODIPINE BESYLATE 5 MG PO TABS: 5.0000 mg | ORAL_TABLET | Freq: Every day | ORAL | 3 refills | Status: AC

## 2023-12-25 NOTE — Patient Instructions (Signed)
 Medication Instructions:  Your physician has recommended you make the following change in your medication:   STOP Amlodipine  2.5  START Amlodipine  5 mg taking 1 daily  *If you need a refill on your cardiac medications before your next appointment, please call your pharmacy*  Lab Work: None ordered  If you have labs (blood work) drawn today and your tests are completely normal, you will receive your results only by: MyChart Message (if you have MyChart) OR A paper copy in the mail If you have any lab test that is abnormal or we need to change your treatment, we will call you to review the results.  Testing/Procedures: Your physician has recommended that you have a sleep study. This test records several body functions during sleep, including: brain activity, eye movement, oxygen  and carbon dioxide blood levels, heart rate and rhythm, breathing rate and rhythm, the flow of air through your mouth and nose, snoring, body muscle movements, and chest and belly movement. This will come in the mail to you.   Follow-Up: At [facility], you and your health needs are our priority.  As part of our continuing mission to provide you with exceptional heart care, our providers are all part of one team.  This team includes your primary Cardiologist (physician) and Advanced Practice Providers or APPs (Physician Assistants and Nurse Practitioners) who all work together to provide you with the care you need, when you need it.  Your next appointment:   2 month(s)  Provider:   Callie Goodrich, PA-C          We recommend signing up for the patient portal called MyChart.  Sign up information is provided on this After Visit Summary.  MyChart is used to connect with patients for Virtual Visits (Telemedicine).  Patients are able to view lab/test results, encounter notes, upcoming appointments, etc.  Non-urgent messages can be sent to your provider as well.   To learn more about what you can do with  MyChart, go to [email or web address].   Other Instructions Your physician has requested that you regularly monitor and record your blood pressure readings at home. Please use the same machine at the same time of day to check your readings and record them to bring to your follow-up visit.   Please monitor blood pressures and keep a log of your readings.    Make sure to check 2 hours after your medications.    AVOID these things for 30 minutes before checking your blood pressure: No Drinking caffeine. No Drinking alcohol. No Eating. No Smoking. No Exercising.   Five minutes before checking your blood pressure: Pee. Sit in a dining chair. Avoid sitting in a soft couch or armchair. Be quiet. Do not talk

## 2024-01-19 ENCOUNTER — Telehealth: Payer: Self-pay | Admitting: Physician Assistant

## 2024-01-19 ENCOUNTER — Encounter: Payer: Self-pay | Admitting: Physician Assistant

## 2024-01-19 NOTE — Progress Notes (Deleted)
 [facility] OFFICE PROGRESS NOTE  Victoria Booth, Victoria Booth [address] [place] [address]  DIAGNOSIS: Crohn's disease and persistent iron  deficiency anemia and intolerance to the oral iron  tablets.   PRIOR THERAPY: IV iron  PRN, last given on 12/23/23  CURRENT THERAPY: None   INTERVAL HISTORY: Victoria Booth 43 y.o. female returns to clinic today for follow-up visit.  The patient was last seen 1 month ago on 12/18/2023.  The patient has history of iron  deficiency anemia secondary to Crohn's disease.  Her folic acid  was low at her last appointment.  She has intolerance to oral iron  supplements due to her Crohn's disease and constipation.  She receives IV iron  on an as needed basis most recent on 12/23/23.   She states she is *** today. Denies any abnormal bleeding or bruising. ***menstral cycles.  She just established with a new GYN.    She follows with Dr. Avram from gastroenterology. She believes she is due for a colonoscopy soon. She has an appointment on ***.   She feels tired most of the time and occasionally experiences lightheadedness or dizziness. She does not have any dietary restrictions but prefers to avoid red meat due to difficulty digesting it. She consumes green leafy vegetables like spinach.   She is here for evaluation and repeat blood work.    MEDICAL HISTORY: Past Medical History:  Diagnosis Date   Anemia    Asthma    Crohn disease (HCC)    Hypertension    LUQ abdominal pain 10/16/2017   Microcytic anemia 12/01/2014   Obesity     ALLERGIES:  is allergic to cephalexin , lisinopril , penicillins, povidone iodine, shellfish allergy, latex, and sulfa antibiotics.  MEDICATIONS:  Current Outpatient Medications  Medication Sig Dispense Refill   albuterol  (PROVENTIL ) (2.5 MG/3ML) 0.083% nebulizer solution Take 3 mLs (2.5 mg total) by nebulization every 6 (six) hours as needed for wheezing or shortness of breath. 75 mL 12   amLODipine   (NORVASC ) 5 MG tablet Take 1 tablet (5 mg total) by mouth daily. 90 tablet 3   folic acid  (FOLVITE ) 1 MG tablet Take 1 tablet (1 mg total) by mouth daily. (Patient not taking: Reported on 12/25/2023) 30 tablet 2   spironolactone  (ALDACTONE ) 25 MG tablet Take 25 mg by mouth daily.     No current facility-administered medications for this visit.    SURGICAL HISTORY:  Past Surgical History:  Procedure Laterality Date   EXTRACORPOREAL SHOCK WAVE LITHOTRIPSY Left 07/16/2018   Procedure: EXTRACORPOREAL SHOCK WAVE LITHOTRIPSY (ESWL);  Surgeon: Penne Rosina, MD;  Location: [facility] ORS;  Service: Urology;  Laterality: Left;   TOENAIL EXCISION      REVIEW OF SYSTEMS:   Review of Systems  Constitutional: Negative for appetite change, chills, fatigue, fever and unexpected weight change.  HENT:   Negative for mouth sores, nosebleeds, sore throat and trouble swallowing.   Eyes: Negative for eye problems and icterus.  Respiratory: Negative for cough, hemoptysis, shortness of breath and wheezing.   Cardiovascular: Negative for chest pain and leg swelling.  Gastrointestinal: Negative for abdominal pain, constipation, diarrhea, nausea and vomiting.  Genitourinary: Negative for bladder incontinence, difficulty urinating, dysuria, frequency and hematuria.   Musculoskeletal: Negative for back pain, gait problem, neck pain and neck stiffness.  Skin: Negative for itching and rash.  Neurological: Negative for dizziness, extremity weakness, gait problem, headaches, light-headedness and seizures.  Hematological: Negative for adenopathy. Does not bruise/bleed easily.  Psychiatric/Behavioral: Negative for confusion, depression and sleep disturbance. The  patient is not nervous/anxious.     PHYSICAL EXAMINATION:  There were no vitals taken for this visit.  ECOG PERFORMANCE STATUS: {CHL ONC ECOG [id number]  Physical Exam  Constitutional: Oriented to person, place, and time and well-developed, well-nourished,  and in no distress. No distress.  HENT:  Head: Normocephalic and atraumatic.  Mouth/Throat: Oropharynx is clear and moist. No oropharyngeal exudate.  Eyes: Conjunctivae are normal. Right eye exhibits no discharge. Left eye exhibits no discharge. No scleral icterus.  Neck: Normal range of motion. Neck supple.  Cardiovascular: Normal rate, regular rhythm, normal heart sounds and intact distal pulses.   Pulmonary/Chest: Effort normal and breath sounds normal. No respiratory distress. No wheezes. No rales.  Abdominal: Soft. Bowel sounds are normal. Exhibits no distension and no mass. There is no tenderness.  Musculoskeletal: Normal range of motion. Exhibits no edema.  Lymphadenopathy:    No cervical adenopathy.  Neurological: Alert and oriented to person, place, and time. Exhibits normal muscle tone. Gait normal. Coordination normal.  Skin: Skin is warm and dry. No rash noted. Not diaphoretic. No erythema. No pallor.  Psychiatric: Mood, memory and judgment normal.  Vitals reviewed.  LABORATORY DATA: Lab Results  Component Value Date   WBC 7.6 12/18/2023   HGB 11.2 (L) 12/18/2023   HCT 36.3 12/18/2023   MCV 74.7 (L) 12/18/2023   PLT 281 12/18/2023      Chemistry      Component Value Date/Time   NA 139 12/18/2023 0740   NA 138 06/02/2019 1117   K 3.9 12/18/2023 0740   CL 108 12/18/2023 0740   CO2 23 12/18/2023 0740   BUN 11 12/18/2023 0740   BUN 7 06/02/2019 1117   CREATININE 0.72 12/18/2023 0740      Component Value Date/Time   CALCIUM 8.8 (L) 12/18/2023 0740   ALKPHOS 84 12/18/2023 0740   AST 11 (L) 12/18/2023 0740   ALT 7 12/18/2023 0740   BILITOT 0.3 12/18/2023 0740       RADIOGRAPHIC STUDIES:  No results found.   ASSESSMENT/PLAN:  This is a very pleasant 43 year old African-American female referred to the clinic for iron  deficiency anemia.  She has a history of Crohn's disease.  She has intolerance to oral iron  supplements.   She had repeat CBC, iron  studies,  ferritin, and folic acid .    Her labs today show  Hbg of ***. Her iron  studies are pending as well as her folic acid .   Labs were reviewed which shows ***   We will see her back for labs and a follow-up visit in 3 months   She is unable to tolerate oral iron  supplements.   She will reach out to GI for her colonoscopy if she is due. ***   ***Medication switched from atenolol to amlodipine  due to bradycardia. Current low dose of amlodipine . Elevated blood pressure today without symptoms. Home monitoring advised. - Rechecked blood pressure before leaving. This improved. - Monitor blood pressure at home and log results. - Share blood pressure log with prescribing physician (PCP) and contact them for clarification about her dose.  - Consider increasing amlodipine  dose if hypertension persists   ****Heavy menstrual bleeding Recent heavy bleeding episode. No current gynecologist. Hemoglobin stable, no acute anemia. - Continue follow-up with gynecologist for management.   The patient was advised to call immediately if she has any concerning symptoms in the interval. The patient voices understanding of current disease status and treatment options and is in agreement with the current care plan. All  questions were answered. The patient knows to call the clinic with any problems, questions or concerns. We can certainly see the patient much sooner if necessary   No orders of the defined types were placed in this encounter.    I spent {CHL ONC TIME VISIT - DTPQU:[id number]} counseling the patient face to face. The total time spent in the appointment was {CHL ONC TIME VISIT - DTPQU:[id number]}.  Baylynn Shifflett L Mykle Pascua, PA-C 01/19/24

## 2024-01-19 NOTE — Telephone Encounter (Signed)
 I am scheduled to see this patient later this week, however, the follow up was intended to be in 3 months from now. I tried to call her to let her know this. Voicemail is full. I will send mychart message. Of course, if she is interested in keeping the appointment as scheduled, I would be happy to see her.

## 2024-01-23 ENCOUNTER — Inpatient Hospital Stay: Admitting: Physician Assistant

## 2024-01-23 ENCOUNTER — Telehealth: Payer: Self-pay | Admitting: *Deleted

## 2024-01-23 ENCOUNTER — Inpatient Hospital Stay

## 2024-01-23 NOTE — Telephone Encounter (Signed)
 Fax came for patient that the itamar sleep test has not been approved. Ref id# [id number]

## 2024-02-10 NOTE — Progress Notes (Deleted)
" ° °  Cardiology Office Note:    Date:  02/10/2024   ID:  Victoria Booth, DOB [birth year], MRN [id number]  PCP:  [name], PA  Cardiologist:  None Cardiology APP:  Shondrika Hoque E, PA-C { Click to update primary MD,subspecialty MD or APP then REFRESH:1}    Referring MD: [name], PA   Chief Complaint: follow-up of hypertension  History of Present Illness:    Victoria Booth is a 43 y.o. female with a history of  hypertension, type 2 diabetes mellitus, asthma, Chron's disease, iron  deficiency anemia, remote migraines, and obesity who presents today for follow-up of hypertension.  Patient was first seen by me in the [facility] in 12/2023.  Referral was initially placed for palpitations but patient denies any significant additions to me.  She was more concerned about her elevated BP.  Her medications were adjusted slightly and she was advised to keep a BP log for 2 weeks.  Itmar home sleep study was also ordered to assess for obstructive sleep apnea.  Patient presents today for follow-up. ***  Hypertension *** BP *** - Continue current medications: Amlodipine  5mg  daily and Spironolactone  25mg  daily.  Palpitations Patient has rare palpitations that sounds like premature beats. *** - Can continue to monitor for now. If symptoms worsen, can get a formal monitor.   Type 2 Diabetes Mellitus Hemoglobin A1c 6.1% in 10/2023.  - Management per PCP.   Snoring STOP-BANG score = 4 suggesting she is high risk for obstructive sleep apnea. Itmar sleep study was ordered at last visit ***  EKGs/Labs/Other Studies Reviewed:    The following studies were reviewed:  N/A  EKG:  EKG not ordered today.   Recent Labs: 12/18/2023: ALT 7; BUN 11; Creatinine 0.72; Hemoglobin 11.2; Platelet Count 281; Potassium 3.9; Sodium 139  Recent Lipid Panel    Component Value Date/Time   CHOL 140 12/01/2014 0952   TRIG 60 12/01/2014 0952   HDL 37 (L) 12/01/2014 0952    CHOLHDL 3.8 12/01/2014 0952   CHOLHDL 3.6 Ratio 03/24/2009 2139   VLDL 13 03/24/2009 2139   LDLCALC 91 12/01/2014 0952    Physical Exam:    Vital Signs: There were no vitals taken for this visit.    Wt Readings from Last 3 Encounters:  12/25/23 (!) 305 lb 3.2 oz (138.4 kg)  12/23/23 (!) 308 lb (139.7 kg)  12/18/23 (!) 306 lb (138.8 kg)     General: 43 y.o. female in no acute distress. HEENT: Normocephalic and atraumatic. Sclera clear.  Neck: Supple. No carotid bruits. No JVD. Heart: *** RRR. Distinct S1 and S2. No murmurs, gallops, or rubs.  Lungs: No increased work of breathing. Clear to ausculation bilaterally. No wheezes, rhonchi, or rales.  Abdomen: Soft, non-distended, and non-tender to palpation.  Extremities: No lower extremity edema.  Radial and distal pedal pulses 2+ and equal bilaterally. Skin: Warm and dry. Neuro: No focal deficits. Psych: Normal affect. Responds appropriately.   Assessment:    No diagnosis found.  Plan:     Disposition: Follow up in ***   Signed, Aline FORBES Door, PA-C  02/10/2024 7:57 AM    [facility] "

## 2024-02-17 ENCOUNTER — Ambulatory Visit: Admitting: Student
